# Patient Record
Sex: Female | Born: 1945 | Race: Black or African American | Hispanic: No | Marital: Single | Smoking: Former smoker
Health system: Southern US, Community
[De-identification: ages and names within clinical notes are randomized; demographics above are authoritative.]

## PROBLEM LIST (undated history)

## (undated) DIAGNOSIS — K5790 Diverticulosis of intestine, part unspecified, without perforation or abscess without bleeding: Secondary | ICD-10-CM

## (undated) DIAGNOSIS — F419 Anxiety disorder, unspecified: Secondary | ICD-10-CM

## (undated) DIAGNOSIS — Z9289 Personal history of other medical treatment: Secondary | ICD-10-CM

## (undated) DIAGNOSIS — I10 Essential (primary) hypertension: Secondary | ICD-10-CM

## (undated) DIAGNOSIS — Z881 Allergy status to other antibiotic agents status: Secondary | ICD-10-CM

## (undated) DIAGNOSIS — E669 Obesity, unspecified: Secondary | ICD-10-CM

## (undated) DIAGNOSIS — T7840XA Allergy, unspecified, initial encounter: Secondary | ICD-10-CM

## (undated) DIAGNOSIS — G709 Myoneural disorder, unspecified: Secondary | ICD-10-CM

## (undated) DIAGNOSIS — R7611 Nonspecific reaction to tuberculin skin test without active tuberculosis: Secondary | ICD-10-CM

## (undated) DIAGNOSIS — E785 Hyperlipidemia, unspecified: Secondary | ICD-10-CM

## (undated) DIAGNOSIS — K219 Gastro-esophageal reflux disease without esophagitis: Secondary | ICD-10-CM

## (undated) DIAGNOSIS — M1990 Unspecified osteoarthritis, unspecified site: Secondary | ICD-10-CM

## (undated) DIAGNOSIS — E119 Type 2 diabetes mellitus without complications: Secondary | ICD-10-CM

## (undated) DIAGNOSIS — I2510 Atherosclerotic heart disease of native coronary artery without angina pectoris: Secondary | ICD-10-CM

## (undated) DIAGNOSIS — Z955 Presence of coronary angioplasty implant and graft: Secondary | ICD-10-CM

## (undated) DIAGNOSIS — M25569 Pain in unspecified knee: Secondary | ICD-10-CM

## (undated) DIAGNOSIS — I872 Venous insufficiency (chronic) (peripheral): Secondary | ICD-10-CM

## (undated) DIAGNOSIS — Z5189 Encounter for other specified aftercare: Secondary | ICD-10-CM

## (undated) DIAGNOSIS — T783XXA Angioneurotic edema, initial encounter: Secondary | ICD-10-CM

## (undated) DIAGNOSIS — K449 Diaphragmatic hernia without obstruction or gangrene: Secondary | ICD-10-CM

## (undated) HISTORY — DX: Allergy, unspecified, initial encounter: T78.40XA

## (undated) HISTORY — DX: Gastro-esophageal reflux disease without esophagitis: K21.9

## (undated) HISTORY — DX: Presence of coronary angioplasty implant and graft: Z95.5

## (undated) HISTORY — DX: Obesity, unspecified: E66.9

## (undated) HISTORY — DX: Allergy status to other antibiotic agents: Z88.1

## (undated) HISTORY — DX: Nonspecific reaction to tuberculin skin test without active tuberculosis: R76.11

## (undated) HISTORY — DX: Pain in unspecified knee: M25.569

## (undated) HISTORY — DX: Type 2 diabetes mellitus without complications: E11.9

## (undated) HISTORY — DX: Essential (primary) hypertension: I10

## (undated) HISTORY — PX: BREAST LUMPECTOMY: SHX2

## (undated) HISTORY — DX: Atherosclerotic heart disease of native coronary artery without angina pectoris: I25.10

## (undated) HISTORY — PX: ABDOMINAL HYSTERECTOMY: SHX81

## (undated) HISTORY — DX: Angioneurotic edema, initial encounter: T78.3XXA

## (undated) HISTORY — DX: Hyperlipidemia, unspecified: E78.5

## (undated) HISTORY — DX: Diaphragmatic hernia without obstruction or gangrene: K44.9

## (undated) HISTORY — DX: Personal history of other medical treatment: Z92.89

## (undated) HISTORY — DX: Encounter for other specified aftercare: Z51.89

## (undated) HISTORY — DX: Anxiety disorder, unspecified: F41.9

## (undated) HISTORY — DX: Venous insufficiency (chronic) (peripheral): I87.2

## (undated) HISTORY — DX: Myoneural disorder, unspecified: G70.9

## (undated) HISTORY — DX: Diverticulosis of intestine, part unspecified, without perforation or abscess without bleeding: K57.90

## (undated) HISTORY — DX: Unspecified osteoarthritis, unspecified site: M19.90

---

## 1999-06-29 ENCOUNTER — Encounter: Payer: Self-pay | Admitting: Gastroenterology

## 1999-06-29 ENCOUNTER — Other Ambulatory Visit: Admission: RE | Admit: 1999-06-29 | Discharge: 1999-06-29 | Payer: Self-pay | Admitting: Gastroenterology

## 1999-06-29 ENCOUNTER — Encounter (INDEPENDENT_AMBULATORY_CARE_PROVIDER_SITE_OTHER): Payer: Self-pay | Admitting: Specialist

## 2003-05-29 LAB — HM DEXA SCAN

## 2003-05-31 ENCOUNTER — Emergency Department (HOSPITAL_COMMUNITY): Admission: EM | Admit: 2003-05-31 | Discharge: 2003-06-01 | Payer: Self-pay | Admitting: Emergency Medicine

## 2004-07-27 ENCOUNTER — Emergency Department (HOSPITAL_COMMUNITY): Admission: EM | Admit: 2004-07-27 | Discharge: 2004-07-28 | Payer: Self-pay | Admitting: Emergency Medicine

## 2004-08-28 LAB — HM COLONOSCOPY

## 2004-09-05 ENCOUNTER — Ambulatory Visit: Payer: Self-pay | Admitting: Gastroenterology

## 2004-09-06 ENCOUNTER — Ambulatory Visit: Payer: Self-pay | Admitting: Gastroenterology

## 2005-06-29 ENCOUNTER — Ambulatory Visit (HOSPITAL_COMMUNITY): Admission: AD | Admit: 2005-06-29 | Discharge: 2005-06-30 | Payer: Self-pay | Admitting: Cardiovascular Disease

## 2005-06-29 HISTORY — PX: CORONARY ANGIOPLASTY WITH STENT PLACEMENT: SHX49

## 2006-08-08 ENCOUNTER — Emergency Department (HOSPITAL_COMMUNITY): Admission: EM | Admit: 2006-08-08 | Discharge: 2006-08-08 | Payer: Self-pay | Admitting: Emergency Medicine

## 2006-12-28 ENCOUNTER — Ambulatory Visit (HOSPITAL_COMMUNITY): Admission: RE | Admit: 2006-12-28 | Discharge: 2006-12-28 | Payer: Self-pay | Admitting: *Deleted

## 2007-05-29 LAB — HM MAMMOGRAPHY

## 2008-07-07 LAB — HM PAP SMEAR: HM Pap smear: NORMAL

## 2009-06-06 ENCOUNTER — Emergency Department (HOSPITAL_COMMUNITY): Admission: EM | Admit: 2009-06-06 | Discharge: 2009-06-06 | Payer: Self-pay | Admitting: Emergency Medicine

## 2009-08-11 ENCOUNTER — Encounter (INDEPENDENT_AMBULATORY_CARE_PROVIDER_SITE_OTHER): Payer: Self-pay | Admitting: *Deleted

## 2009-08-28 HISTORY — PX: COLONOSCOPY: SHX174

## 2009-09-11 ENCOUNTER — Emergency Department (HOSPITAL_COMMUNITY): Admission: EM | Admit: 2009-09-11 | Discharge: 2009-09-11 | Payer: Self-pay | Admitting: Family Medicine

## 2010-03-24 ENCOUNTER — Encounter (INDEPENDENT_AMBULATORY_CARE_PROVIDER_SITE_OTHER): Payer: Self-pay | Admitting: *Deleted

## 2010-06-06 ENCOUNTER — Ambulatory Visit: Payer: Self-pay | Admitting: Gastroenterology

## 2010-06-06 ENCOUNTER — Encounter (INDEPENDENT_AMBULATORY_CARE_PROVIDER_SITE_OTHER): Payer: Self-pay | Admitting: *Deleted

## 2010-06-06 DIAGNOSIS — I2510 Atherosclerotic heart disease of native coronary artery without angina pectoris: Secondary | ICD-10-CM | POA: Insufficient documentation

## 2010-06-06 DIAGNOSIS — K219 Gastro-esophageal reflux disease without esophagitis: Secondary | ICD-10-CM | POA: Insufficient documentation

## 2010-06-06 DIAGNOSIS — Z86010 Personal history of colonic polyps: Secondary | ICD-10-CM | POA: Insufficient documentation

## 2010-06-20 ENCOUNTER — Encounter: Payer: Self-pay | Admitting: Gastroenterology

## 2010-06-28 ENCOUNTER — Ambulatory Visit: Payer: Self-pay | Admitting: Gastroenterology

## 2010-06-28 LAB — HM COLONOSCOPY

## 2010-07-19 ENCOUNTER — Telehealth: Payer: Self-pay | Admitting: Gastroenterology

## 2010-08-28 HISTORY — PX: BACK SURGERY: SHX140

## 2010-09-27 NOTE — Letter (Signed)
Summary: New Patient letter  [facility]  [address] [place], [place] [address]   Phone: [phone]  Fax: [phone]       03/24/2010 MRN: [id number]  [facility] [address] [place], [place]  [address]  Dear Ms. Smyers,  Welcome to the Gastroenterology Division at [facility].    You are scheduled to see Dr.  Russella Dar on 05-23-10 at 2:30p.m. on the 3rd floor at [facility], [address]  We ask that you try to arrive at our office 15 minutes prior to your appointment time to allow for check-in.  We would like you to complete the enclosed self-administered evaluation form prior to your visit and bring it with you on the day of your appointment.  We will review it with you.  Also, please bring a complete list of all your medications or, if you prefer, bring the medication bottles and we will list them.  Please bring your insurance card so that we may make a copy of it.  If your insurance requires a referral to see a specialist, please bring your referral form from your primary care physician.  Co-payments are due at the time of your visit and may be paid by cash, check or credit card.     Your office visit will consist of a consult with your physician (includes a physical exam), any laboratory testing he/she may order, scheduling of any necessary diagnostic testing (e.g. x-ray, ultrasound, CT-scan), and scheduling of a procedure (e.g. Endoscopy, Colonoscopy) if required.  Please allow enough time on your schedule to allow for any/all of these possibilities.    If you cannot keep your appointment, please call [phone] to cancel or reschedule prior to your appointment date.  This allows [place] the opportunity to schedule an appointment for another patient in need of care.  If you do not cancel or reschedule by 5 p.m. the business day prior to your appointment date, you will be charged a $50.00 late cancellation/no-show fee.    Thank you for choosing  [facility] for your medical needs.  We appreciate the opportunity to care for you.  Please visit [place] at our website  to learn more about our practice.                     Sincerely,                                                             The Gastroenterology Division

## 2010-09-27 NOTE — Procedures (Signed)
Summary: EGD   EGD  Procedure date:  09/06/2004  Findings:      Hiatal Hernia Location: [facility]   Patient Name: Madeline Wood, Madeline Wood. MRN:  Procedure Procedures: Panendoscopy (EGD) CPT: 43235.  Personnel: Endoscopist: Venita Lick. Russella Dar, MD, Clementeen Graham.  Referred By: Rudi Heap, MD.  Exam Location: Exam performed in Outpatient Clinic. Outpatient  Patient Consent: Procedure, Alternatives, Risks and Benefits discussed, consent obtained, from patient. Consent was obtained by the RN.  Indications Symptoms: Reflux symptoms  History  Current Medications: Patient is not currently taking Coumadin.  Pre-Exam Physical: Performed Sep 06, 2004  Entire physical exam was normal.  Exam Exam Info: Maximum depth of insertion Duodenum, intended Duodenum. Vocal cords not visualized. Gastric retroflexion performed. ASA Classification: II. Tolerance: excellent.  Sedation Meds: Patient assessed and found to be appropriate for moderate (conscious) sedation. Residual sedation present from prior procedure today. Cetacaine Spray 2 sprays given aerosolized.  Monitoring: BP and pulse monitoring done. Oximetry used. Supplemental O2 given  Findings Normal: Proximal Esophagus to Distal Esophagus.  HIATAL HERNIA: Regular, 2 cms. in length. ICD9: Hernia, Hiatal: 553.3. Normal: Fundus to Duodenal 2nd Portion.   Assessment  Diagnoses: 553.3: Hernia, Hiatal.   Events  Unplanned Intervention: No unplanned interventions were required.  Unplanned Events: There were no complications. Plans Medication(s): Continue current medications. PPI: BID,   Patient Education: Patient given standard instructions for: Hiatal Hernia. Reflux.  Disposition: After procedure patient sent to recovery. After recovery patient sent home.  Scheduling: Primary Care Rochelle Nephew, to Rudi Heap, MD,   Office Visit, to Venita Lick. Russella Dar, MD, [facility], prn    This report was created from the original endoscopy  report, which was reviewed and signed by the above listed endoscopist.    cc: Rudi Heap, MD

## 2010-09-27 NOTE — Letter (Signed)
Summary: Anticoagulation/[facility]  Anticoagulation/[facility]   Imported By: Sherian Rein 06/23/2010 14:41:41  _____________________________________________________________________  External Attachment:    Type:   Image     Comment:   External Document

## 2010-09-27 NOTE — Procedures (Signed)
Summary: Colonoscopy   Colonoscopy  Procedure date:  09/06/2004  Findings:      Results: Hemorrhoids.     Results: Diverticulosis.       Pathology:  Hyperplastic polyp.     Location:  [facility].    Procedures Next Due Date:    Colonoscopy: 08/2009  Colonoscopy  Procedure date:  09/06/2004  Findings:      Results: Hemorrhoids.     Results: Diverticulosis.       Pathology:  Hyperplastic polyp.     Location:  [facility].    Procedures Next Due Date:    Colonoscopy: 08/2009 Patient Name: Madeline Wood, Madeline Wood. MRN:  Procedure Procedures: Colonoscopy CPT: [phone].    with Hot Biopsy(s)CPT: [id number].  Personnel: Endoscopist: Venita Lick. Russella Dar, MD, Clementeen Graham.  Referred By: Rudi Heap, MD.  Exam Location: Exam performed in Outpatient Clinic. Outpatient  Patient Consent: Procedure, Alternatives, Risks and Benefits discussed, consent obtained, from patient. Consent was obtained by the RN.  Indications  Increased Risk Screening: For family history of colorectal neoplasia, in  sibling age at onset: 16.  Comments: Brother with colon ca. History  Current Medications: Patient is not currently taking Coumadin.  Pre-Exam Physical: Performed Sep 06, 2004. Entire physical exam was normal.  Exam Exam: Extent of exam reached: Cecum, extent intended: Cecum.  The cecum was identified by appendiceal orifice and IC valve. Colon retroflexion performed. ASA Classification: II. Tolerance: excellent.  Monitoring: Pulse and BP monitoring, Oximetry used. Supplemental O2 given.  Colon Prep Used Miralax for colon prep. Prep results: good.  Sedation Meds: Patient assessed and found to be appropriate for moderate (conscious) sedation. Fentanyl 125 mcg. given IV. Versed 12 given IV.  Findings - DIVERTICULOSIS: Sigmoid Colon. Not bleeding. ICD9: Diverticulosis: 562.10.  NORMAL EXAM: Cecum to Descending Colon.  MULTIPLE POLYPS: Sigmoid Colon to Rectum. minimum size 2  mm, maximum size 3 mm. Procedure:  hot biopsy, removed, Polyp retrieved, 5 polyps Polyps sent to pathology. ICD9: Colon Polyps: 211.3.  HEMORRHOIDS: Internal. Size: Small. Not bleeding. Not thrombosed. ICD9: Hemorrhoids, Internal: 455.0.   Assessment  Diagnoses: 562.10: Diverticulosis.  211.3: Colon Polyps.  455.0: Hemorrhoids, Internal.   Events  Unplanned Interventions: No intervention was required.  Unplanned Events: There were no complications. Plans  Post Exam Instructions: No aspirin or non-steroidal containing medications: 2 weeks.  Medication Plan: Await pathology. Continue current medications.  Patient Education: Patient given standard instructions for: Polyps. Diverticulosis. Hemorrhoids.  Disposition: After procedure patient sent to recovery. After recovery patient sent home.  Scheduling/Referral: Colonoscopy, to [facility] T. Russella Dar, MD, [facility], around Sep 06, 2009.    This report was created from the original endoscopy report, which was reviewed and signed by the above listed endoscopist.    cc: Rudi Heap, MD

## 2010-09-27 NOTE — Letter (Signed)
Summary: Anticoagulation Modification Letter  [facility]  [address] [place], [place] [address]   Phone: [phone]  Fax: [phone]    June 06, 2010  Re:    Madeline Wood DOB:    1945-09-12 MRN:    [id number]    Dear Dr. Lynnea Ferrier,   We have scheduled the above patient for an endoscopic procedure. Our records show that  he/she is on anticoagulation therapy. Please advise as to how long the patient may come off their therapy of Plavix prior to the scheduled procedure(s) on 07/01/10.   Please fax back/or route the completed form to [place] at [phone].  Thank you for your help with this matter.  Sincerely,  Christie Nottingham CMA Duncan Dull)   Physician Recommendation:  Hold Plavix 5 days prior ________________  Hold Coumadin 5 days prior ____________  Other ______________________________     Appended Document: Anticoagulation Modification Letter left message for pt  to call back   Appended Document: Anticoagulation Modification Letter left message for pt  to call back   Appended Document: Anticoagulation Modification Letter left message for pt  to call back   Appended Document: Anticoagulation Modification Letter Pt notified to come off 5 days before procedure.

## 2010-09-27 NOTE — Assessment & Plan Note (Signed)
Summary: Gastroenterology  [facility] MR#:  [id number] Page #  NAME:  Madeline Wood, Madeline Wood  OFFICE NO:  [id number]  DATE:  09/05/04  DOB:  Jul 15, 2046  REFERRING PHYSICIAN:  Dr. Rudi Heap at [facility].    REASON FOR REFERRAL:  Gastroesophageal reflux disease and family history of colon cancer.    HISTORY OF PRESENT ILLNESS:  The patient is a very nice 65 year old [place] American female that I have seen in the past for colonoscopy.  Her brother was diagnosed with colon cancer in his 45s, and she has a paternal uncle with colon cancer as well.  She underwent colonoscopy in November of 2000, which revealed a hyperplastic sigmoid colon polyp and small internal hemorrhoids.  She was advised to return for 5-year followup, which is now due.  She has had intermittent problems with mild constipation, occasional gas and bloating, and occasional hemorrhoidal symptoms.  She has had worsening reflux symptoms and was recently increased from Aciphex daily to b.i.d.  With this regimen, she has had good control of her reflux symptoms.  She has no dysphagia or odynophagia.  She denies any weight loss, change in stool caliber, change in bowel habits, melena, or hematochezia.    PAST MEDICAL HISTORY:  Hypertension, hyperlipidemia, arthritis, anxiety, sinus trouble, status post hysterectomy, status post bilateral tubal ligation, status post breast cyst removal, hyperplastic colon polyps, internal hemorrhoids.    CURRENT MEDICATIONS:  Listed on the chart, updated and reviewed.  MEDICATION ALLERGIES:  Cipro, Lexapro, and Paxil.    SOCIAL HISTORY:  She is divorced with 1 child.  She is a former cigarette smoker and quit 2 years ago.  She denies any alcohol use.    REVIEW OF SYSTEMS:  Several areas positive as per the diagnostic evaluation form.    PHYSICAL EXAMINATION:  Overweight, in no acute distress.  Height 5 feet 6 inches, weight 233 pounds, blood pressure is 118/68, pulse 84 and regular.  HEENT  exam:  Anicteric sclerae.  Oropharynx clear.  Chest:  Clear to auscultation.  Cardiac:  Regular rate and rhythm without murmurs.  Abdomen:  Soft, nontender, and nondistended, normoactive bowel sounds.  No palpable organomegaly, masses, or hernias.  Rectal examination:  Deferred to time of colonoscopy.  Extremities:  Without clubbing, cyanosis, or edema.  Neurologic:  Alert and oriented x3.   Grossly nonfocal.    ASSESSMENT AND PLAN:   1.  Worsening gastroesophageal reflux disease.  Rule out erosive esophagitis.  Maintain all standard antireflux measures.  Written literature supplied to the patient.  She will maintain Aciphex 20 mg p.o. b.i.d.  Risks, benefits, and alternatives to upper endoscopy with possible biopsy discussed with the patient and she consents to proceed.  This will be scheduled electively.   2.  Family history of colon cancer-brother in his 13s and a paternal uncle as well.  Risks, benefits, and alternatives to colonoscopy with possible biopsy and possible polypectomy discussed with the patient and she consents to proceed.  This will be scheduled electively at the time of her upper endoscopy.        Venita Lick. Russella Dar, M.D., F.A.C.G.  [id number] cc:  Dr. Rudi Heap, [facility]  D:  09/05/04; T:  ; Job [id number]

## 2010-09-27 NOTE — Procedures (Signed)
Summary: [employer] [facility]   Imported By: Sherian Rein 06/07/2010 07:06:40  _____________________________________________________________________  External Attachment:    Type:   Image     Comment:   External Document

## 2010-09-27 NOTE — Letter (Signed)
Summary: Colonoscopy Letter  [facility]  [address] [place], [place] [address]   Phone: [phone]  Fax: [phone]      August 11, 2009 MRN: [id number]   [facility] [address] [place], [place]  [address]   Dear Madeline Wood,   According to your medical record, it is time for you to schedule a Colonoscopy. The American Cancer Society recommends this procedure as a method to detect early colon cancer. Patients with a family history of colon cancer, or a personal history of colon polyps or inflammatory bowel disease are at increased risk.  This letter has beeen generated based on the recommendations made at the time of your procedure. If you feel that in your particular situation this may no longer apply, please contact our office.  Please call our office at [phone] to schedule this appointment or to update your records at your earliest convenience.  Thank you for cooperating with [place] to provide you with the very best care possible.   Sincerely,  Judie Petit T. Russella Dar, M.D.  [facility] [phone]

## 2010-09-27 NOTE — Procedures (Signed)
Summary: Colonoscopy  Patient: [name] Note: All result statuses are Final unless otherwise noted.  Tests: (1) Colonoscopy (COL)   COL Colonoscopy           DONE     [facility]     [address]     [place], [place]  [address]           COLONOSCOPY PROCEDURE REPORT           PATIENT:  Madeline Wood, Madeline Wood  MR#:  [id number]     BIRTHDATE:  1945-09-06, 63 yrs. old  GENDER:  female     ENDOSCOPIST:  Judie Petit T. Russella Dar, MD, [facility]           PROCEDURE DATE:  06/28/2010     PROCEDURE:  Colonoscopy 19147     ASA CLASS:  Class II     INDICATIONS:  1) surveillance and high-risk screening  2) family     history of colon cancer: brother at 4 and paternal uncle.     MEDICATIONS:   Fentanyl 100 mcg IV, Versed 10 mg IV     DESCRIPTION OF PROCEDURE:   After the risks benefits and     alternatives of the procedure were thoroughly explained, informed     consent was obtained.  Digital rectal exam was performed and     revealed no abnormalities.   The LB [id number] endoscope     was introduced through the anus and advanced to the cecum, which     was identified by both the appendix and ileocecal valve, without     limitations.  The quality of the prep was good, using MoviPrep.     The instrument was then slowly withdrawn as the colon was fully     examined.     <<PROCEDUREIMAGES>>     FINDINGS:  Mild diverticulosis was found in the sigmoid colon.     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 It was mild.     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 It was 4     mm in size and nonbleeding.  Retroflexed views in the rectum     revealed internal hemorrhoids, small.  The exam was otherwise     unremarkable. The time to cecum =  4  minutes. The scope was then     withdrawn (time =  7.75  min) from the patient and the procedure     completed.           COMPLICATIONS:  None           ENDOSCOPIC IMPRESSION:     1) Mild diverticulosis     2) Mild melanosis  throughout the colon     3) Internal hemorrhoids     4) Cecal AVM           RECOMMENDATIONS:     1) High fiber diet with liberal fluid intake.     2) Repeat Colonoscopy in 5 years.           Venita Lick. Russella Dar, MD, Clementeen Graham           CC: Rudi Heap, MD           n.     Rosalie DoctorVenita Lick. Vivian Okelley at 06/28/2010 02:38 PM           [employer], [id number]  Note: An exclamation mark (!) indicates a result that was not dispersed into the flowsheet. Document Creation Date: 06/28/2010  2:39 PM _______________________________________________________________________  (1) Order result status: Final Collection or observation date-time: 06/28/2010 14:31 Requested date-time:  Receipt date-time:  Reported date-time:  Referring Physician:   Ordering Physician: Claudette Head [id number]) Specimen Source:  Source: Launa Grill Order Number: [id number] Lab site:   Appended Document: Colonoscopy    Clinical Lists Changes  Observations: Added new observation of COLONNXTDUE: 06/2015 (06/28/2010 16:05)

## 2010-09-27 NOTE — Progress Notes (Signed)
Summary: Triage  Phone Note Call from Patient Call back at [phone]   Caller: Patient Call For: Dr. Russella Dar Reason for Call: Talk to Nurse Summary of Call: rectal bleeding/burning from her hemorroids. Is out of town and would like to see what she can do Initial call taken by: Karna Christmas,  July 19, 2010 11:47 AM  Follow-up for Phone Call        Patient  is in [place].  States she had a large amount of blood with some clots yesterday.  She says today she is still having some bleeding today.  Patient  is on Plavix.  She is requesting advice.  Patient is advised to go to Urgent care for eval today in [place]. Follow-up by: Darcey Nora RN, CGRN,  July 19, 2010 1:28 PM  Additional Follow-up for Phone Call Additional follow up Details #1::        Agree. Needs medical evaluation. Has hemorrhoids and an AVM on her colonoscopy earlier this month. Additional Follow-up by: Meryl Dare MD Clementeen Graham,  July 19, 2010 1:31 PM

## 2010-09-27 NOTE — Letter (Signed)
Summary: [facility] Instructions  [facility]  [address] [place], [place] [address]   Phone: [phone]  Fax: [phone]       Madeline Wood    1946/08/10    MRN: [id number]        Procedure Day /Date: Friday November 4th, 2011     Arrival Time: 2:00PM     Procedure Time: 3:00pm     Location of Procedure:                    _ x_  [facility] (4th Floor)                        PREPARATION FOR COLONOSCOPY WITH MOVIPREP   Starting 5 days prior to your procedure 06/27/10 do not eat nuts, seeds, popcorn, corn, beans, peas,  salads, or any raw vegetables.  Do not take any fiber supplements (e.g. Metamucil, Citrucel, and Benefiber).  THE DAY BEFORE YOUR PROCEDURE         DATE: 06/30/10  DAY: Thursday  1.  Drink clear liquids the entire day-NO SOLID FOOD  2.  Do not drink anything colored red or purple.  Avoid juices with pulp.  No orange juice.  3.  Drink at least 64 oz. (8 glasses) of fluid/clear liquids during the day to prevent dehydration and help the prep work efficiently.  CLEAR LIQUIDS INCLUDE: Water Jello Ice Popsicles Tea (sugar ok, no milk/cream) Powdered fruit flavored drinks Coffee (sugar ok, no milk/cream) Gatorade Juice: apple, white grape, white cranberry  Lemonade Clear bullion, consomm, broth Carbonated beverages (any kind) Strained chicken noodle soup Hard Candy                             4.  In the morning, mix first dose of MoviPrep solution:    Empty 1 Pouch A and 1 Pouch B into the disposable container    Add lukewarm drinking water to the top line of the container. Mix to dissolve    Refrigerate (mixed solution should be used within 24 hrs)  5.  Begin drinking the prep at 5:00 p.m. The MoviPrep container is divided by 4 marks.   Every 15 minutes drink the solution down to the next mark (approximately 8 oz) until the full liter is complete.   6.  Follow completed prep with 16 oz of clear liquid of your choice  (Nothing red or purple).  Continue to drink clear liquids until bedtime.  7.  Before going to bed, mix second dose of MoviPrep solution:    Empty 1 Pouch A and 1 Pouch B into the disposable container    Add lukewarm drinking water to the top line of the container. Mix to dissolve    Refrigerate  THE DAY OF YOUR PROCEDURE      DATE: 07/01/10 DAY: Friday  Beginning at 10:00 a.m. (5 hours before procedure):         1. Every 15 minutes, drink the solution down to the next mark (approx 8 oz) until the full liter is complete.  2. Follow completed prep with 16 oz. of clear liquid of your choice.    3. You may drink clear liquids until 1:00pm (2 HOURS BEFORE PROCEDURE).   MEDICATION INSTRUCTIONS  Unless otherwise instructed, you should take regular prescription medications with a small sip of water   as early as possible the morning of your  procedure. You will be contacted by our office prior to your procedure for directions on holding your Plavix.  If you do not hear from our office 1 week prior to your scheduled procedure, please call [phone] to discuss.        OTHER INSTRUCTIONS  You will need a responsible adult at least 66 years of age to accompany you and drive you home.   This person must remain in the waiting room during your procedure.  Wear loose fitting clothing that is easily removed.  Leave jewelry and other valuables at home.  However, you may wish to bring a book to read or  an iPod/MP3 player to listen to music as you wait for your procedure to start.  Remove all body piercing jewelry and leave at home.  Total time from sign-in until discharge is approximately 2-3 hours.  You should go home directly after your procedure and rest.  You can resume normal activities the  day after your procedure.  The day of your procedure you should not:   Drive   Make legal decisions   Operate machinery   Drink alcohol   Return to work  You will receive specific  instructions about eating, activities and medications before you leave.    The above instructions have been reviewed and explained to me by   Marchelle Folks.    I fully understand and can verbalize these instructions _____________________________ Date _________

## 2010-09-27 NOTE — Assessment & Plan Note (Signed)
Summary: discuss colon on Plavix--ch.   History of Present Illness Visit Type: new patient  Primary GI MD: Elie Goody MD [facility] Primary Nada Godley: Ernestina Penna, MD  Requesting Aubryn Spinola: na Chief Complaint: Family history of colon cancer, GERD  History of Present Illness:   This is a 65 year old female here today with her sister. She has a family history of colon cancer with her brother developing colon cancer at 38 and a paternal uncle with colon cancer. Her last colonoscopy was in January 2006 which showed a hyperplastic colon polyp, diverticulosis, and hemorrhoids. She is status post a coronary artery stent placed in 2006 and states she has been stable from a cardiovascular standpoint since that time. She is maintained on Plavix and ASA. She has occasional heartburn and reflux symptoms are well controlled on Prilosec. She underwent upper endoscopy in January 2006 that showed only a small hiatal hernia.   GI Review of Systems    Reports acid reflux and  heartburn.      Denies abdominal pain, belching, bloating, chest pain, dysphagia with liquids, dysphagia with solids, loss of appetite, nausea, vomiting, vomiting blood, weight loss, and  weight gain.        Denies anal fissure, black tarry stools, change in bowel habit, constipation, diarrhea, diverticulosis, fecal incontinence, heme positive stool, hemorrhoids, irritable bowel syndrome, jaundice, light color stool, liver problems, rectal bleeding, and  rectal pain.   Current Medications (verified): 1)  Fish Oil 1000 Mg Caps (Omega-3 Fatty Acids) .... One Capsule By Mouth Once Daily 2)  Zyrtec Allergy 10 Mg Tabs (Cetirizine Hcl) .... One Tablet By Mouth Once Daily 3)  Glucosamine-Chondroitin  Caps (Glucosamine-Chondroit-Vit C-Mn) .... One Capsule By Mouth Once Daily 4)  Multivitamins   Tabs (Multiple Vitamin) .... One Tablet By Mouth Once Daily 5)  Prilosec Otc 20 Mg Tbec (Omeprazole Magnesium) .... One Tablet By Mouth Once  Daily 6)  Losartan Potassium 50 Mg Tabs (Losartan Potassium) .... One Tablet By Mouth in The Morning and One and A Half Tablet By Mouth At Night 7)  Aspirin 325 Mg Tabs (Aspirin) .... One Tablet By Mouth Once Daily 8)  Meloxicam 7.5 Mg Tabs (Meloxicam) .... One Tablet By Mouth Once Daily 9)  Hydrocodone-Acetaminophen 5-325 Mg Tabs (Hydrocodone-Acetaminophen) .... As Needed 10)  Maxzide-25 37.5-25 Mg Tabs (Triamterene-Hctz) .... One Tablet By Mouth Once Daily 11)  Alprazolam 0.5 Mg Tabs (Alprazolam) .... One Tablet By Mouth Once Daily 12)  Zetia 10 Mg Tabs (Ezetimibe) .... One Tablet By Mouth Once Daily 13)  Plavix 75 Mg Tabs (Clopidogrel Bisulfate) .... One Tablet By Mouth Once Daily 14)  Lipitor 80 Mg Tabs (Atorvastatin Calcium) .... One Tablet By Mouth Once Daily  Allergies (verified): 1)  ! Cipro 2)  ! Lexapro 3)  ! Paxil  Past History:  Past Medical History: GERD Hyperplastic colon polyps  Internal hemorrhoids Arthritis Hyperlipidemia Hypertension Anxiety Disorder Allergic rhinitis Hiatal Hernia Diverticulosis Coronary Artery Disease Urinary Tract Infection  Past Surgical History: Hysterectomy Bilateral tubal ligation Breast cyst removal Angioplasty/coronary stent--06/2005  Family History: Family History of Colon Cancer:Brother diagnosed in his 22's, Paternal Uncle Family History of Diabetes: Sister   Social History: Retired Divorced One Child  Patient is a former smoker.  Alcohol Use - no Illicit Drug Use - no  Review of Systems       The patient complains of allergy/sinus, anxiety-new, arthritis/joint pain, and back pain.         The pertinent positives and negatives are noted  as above and in the HPI. All other ROS were reviewed and were negative.   Vital Signs:  Patient profile:   65 year old female Height:      66 inches Weight:      247 pounds BMI:     40.01 BSA:     2.19 Pulse rate:   68 / minute Pulse rhythm:   regular BP sitting:   138 /  82  (left arm) Cuff size:   regular  Vitals Entered By: Ok Anis CMA (June 06, 2010 11:19 AM)  Physical Exam  General:  Well developed, well nourished, no acute distress. obese.   Head:  Normocephalic and atraumatic. Eyes:  PERRLA, no icterus. Ears:  Normal auditory acuity. Mouth:  No deformity or lesions, dentition normal. Neck:  Supple; no masses or thyromegaly. Lungs:  Clear throughout to auscultation. Heart:  Regular rate and rhythm; no murmurs, rubs,  or bruits. Abdomen:  Soft, nontender and nondistended. No masses, hepatosplenomegaly or hernias noted. Normal bowel sounds. Rectal:  deferred until time of colonoscopy.   Msk:  Symmetrical with no gross deformities. Normal posture. Pulses:  Normal pulses noted. Extremities:  No clubbing, cyanosis, edema or deformities noted. Neurologic:  Alert and  oriented x4;  grossly normal neurologically. Cervical Nodes:  No significant cervical adenopathy. Inguinal Nodes:  No significant inguinal adenopathy. Psych:  Alert and cooperative. Normal mood and affect.  Impression & Recommendations:  Problem # 1:  FAMILY HX COLON CANCER (ICD-V16.0) Brother with colon cancer at age 77 and a paternal uncle with colon cancer. She is due for screening colonoscopy. The risks, benefits and alternatives to colonoscopy with possible biopsy and possible polypectomy were discussed with the patient and they consent to proceed. The procedure will be scheduled electively.  Problem # 2:  CORONARY ARTERY DISEASE (ICD-414.00) Status post coronary stent placement approximately 5 years ago. Plan for 5 days hold off Plavix, and she will maintain aspirin. Will obtain clearance from her cardiologist.  Problem # 3:  GERD (ICD-530.81) GERD well-controlled on omeprazole 20 mg daily, and standard antireflux measures.  Other Orders: Colonoscopy (Colon)  Patient Instructions: 1)  Colonoscopy brochure given.  2)  Pick up your prep from your pharmacy.  3)  Copy  sent to : Rudi Heap, MD 4)                       Ritta Slot, MD 5)  The medication list was reviewed and reconciled.  All changed / newly prescribed medications were explained.  A complete medication list was provided to the patient / caregiver.  Prescriptions: MOVIPREP 100 GM  SOLR (PEG-KCL-NACL-NASULF-NA ASC-C) As per prep instructions.  #1 x 0   Entered by:   Christie Nottingham CMA (AAMA)   Authorized by:   Meryl Dare MD [facility]   Signed by:   Christie Nottingham CMA (AAMA) on 06/06/2010   Method used:   Electronically to        [facility]  [address] [facility] [address] [id number] (retail)       [address]       [place], [place]  [address]       Ph: [phone]       Fax: [phone]   RxID:   [id number]

## 2011-01-13 NOTE — Cardiovascular Report (Signed)
Madeline Wood, Madeline Wood                ACCOUNT NO.:  [id number]   MEDICAL RECORD NO.:  [id number]          PATIENT TYPE:  OIB   LOCATION:  6526                         FACILITY:  [facility]   PHYSICIAN:  Darlin Priestly, MD  DATE OF BIRTH:  [birth year]   DATE OF PROCEDURE:  06/29/2005  DATE OF DISCHARGE:                              CARDIAC CATHETERIZATION   PROCEDURE:  1.  Left heart catheterization.  2.  Coronary angiography.  3.  Left ventriculogram.  4.  Placement of RCA -- distal placement of intercoronary stent.   COMPLICATIONS:  None.   INDICATIONS:  Madeline Wood is a 65 year old female patient of Madeline Wood  at [facility] with a history of hypertension,  hyperlipidemia, history of reflux disease, and remote smoking history.  She  recently has complained of palpitations.  She did undergo a Cardiolite scan  on 05/04/2005 suggesting inferior wall ischemic with normal EF.  She also  wore a Holter monitor which revealed a nonsustained VT.  She is now referred  for a cardiac catheterization to rule out significant CAD.   DESCRIPTION OF PROCEDURE:  After giving an informed consent, the patient was  brought to the catheterization laboratory and the right leg and groin was  shaved, prepped and draped in the usual sterile fashion.  ECG monitoring was  established.  Using modified Seldinger's technique a 6-French arterial  sheath was inserted into the right femoral artery. A 6-French diagnostic was  used to perform diagnostic angiography.   LEFT HEART CATHETERIZATION:  1.  The left main is a large vessel with no significant disease.  2.  The LAD is a large vessel that courses to the apex and gives rise to 2      diagonal branches.  The LAD has no significant disease.  3.  The first diagonal branch is a large vessel with bifurcation distally      with no significant disease.  The second diagonal was a small vessel      with no significant disease.  4.  The left  circumflex is a medium sized vessel that courses through the AV      groove into two obtuse marginal branches.  The AV groove circumflex has      no significant disease.  5.  The first OM is a moderate sized vessel which bifurcates distally with      no evidence of disease.  The second OM is a small vessel with no      significant disease.  6.  The right coronary artery is a large vessel which is dominant.  PDS of      the first lateral branch.  The RCA is coarsely irregular and up to 40%      diseased throughout its proximal and mid segment.  There is a long 80%      lesion of the distal portion of the RCA prior to the bifurcation of the      PDA and posterolateral branch.   The PDA and posterolateral branch were medium sized vessels with no  significant disease.  The left ventriculogram reveals an EF at 60%.   HEMODYNAMIC SYSTEM:  The aortic pressure is 128/74.  LV systolic pressure  124/5, LVDP of 15.   INTERVENTIONAL PROCEDURE:  RCA--__________.  Following diagnostic  angiography a 6-French JR-4 guiding catheter with side holes was __________  engaged in the right coronary ostium.  Next a 0.014 Forte marker wire was  advanced out of the guiding catheter and used to cross the distal RCA  stenotic lesion.  The measurements were obtained.  The guidewire was then  positioned into the posterolateral branch without difficulty.   Next a Taxus 2.5 x 24 mm drug-eluting stent was then positioned across the  distal RCA stenotic lesion.  The stent was deployed to 10 atmospheres for a  total of 32 seconds, the second inflation to 14 atmospheres was performed  for a total of 22 seconds.  Followup angiogram revealed no evidence of  dissection thrombus with TIMI 3 flow to the distal vessel.   IV Angiomax was used throughout the case.   Final angiograms revealed less than 10% residual stenosis in the distal RCA  with TIMI 3 flow in the distal vessel.  At this point, we elected to  conclude  the procedure.  All balloons, instruments, and catheters were  removed.  __________ sheath was sewn in place.  The patient was transferred  back to the ward in stable condition.   CONCLUSIONS:  1.  Successful placement of a Taxus 2.5 x 24 mm drug-eluting stent in the      distal RCA stenotic lesion.  2.  Normal LV systolic function.  3.  Adjuvant use of Angiomax infusion.      Darlin Priestly, MD  Electronically Signed     RHM/MEDQ  D:  06/29/2005  T:  06/29/2005  Job:  [id number]   cc:   Shon Hale Baldo Daub [facility]

## 2011-01-13 NOTE — Discharge Summary (Signed)
NAMETRELLIS, GUIRGUIS                ACCOUNT NO.:  [id number]   MEDICAL RECORD NO.:  [id number]          PATIENT TYPE:  INP   LOCATION:  6526                         FACILITY:  [facility]   PHYSICIAN:  Madeline Priestly, MD  DATE OF BIRTH:  Sep 26, 1945   DATE OF ADMISSION:  06/29/2005  DATE OF DISCHARGE:  06/30/2005                                 DISCHARGE SUMMARY   Ms. Madeline Wood is a 65 year old female patient seen in the office by Dr. Jenne Wood  for palpitations.  She had multiple cardiac tests as an outpatient.  She had  a positive Cardiolite test showing RCA territory ischemia.  She also had  multiple PVCs on the monitor, thus she was brought in for cardiac  catheterization.  This was performed by Dr. Lenise Wood and she was found  to have high-grade disease in her RCA with multiple lesions.  She had a  Taxus stent placed 2.5 x 24, reduced lesion from 80% to less than 10%.  The  following day she was in stable condition and considered stable for  discharge home.  Her blood pressure was 100/52, respirations are 20, pulse  is 80, room air saturations were 99%.  Her potassium was 3.5.  She was given  potassium supplement.  Her LDL was 87.  Zetia was added.   DISCHARGE MEDICATIONS:  1.  Aspirin 325 mg one time per day.  2.  Plavix 75 mg one time per day.  She was told not to stop this      medication.  3.  Toprol-XL 50 mg one time per day.  4.  Aciphex 20 mg two times per day.  5.  Take Prilosec as prior to hospitalization.  6.  Lipitor 80 mg one time per day.  7.  Allopurinol 300 mg one time per day.  8.  Clonazepam 0.5 mg two times a day.  9.  She can take Maxzide as needed for swelling, but should only use it on a      p.r.n. basis.  10. Zetia 10 mg one time per day.  11. Nitroglycerin p.r.n. for chest pain.   FOLLOW UP:  She will have a followup appointment with Dr. Jenne Wood on  November 21 at 9:45.  She should start an exercise program, walk for 30  minutes most days of the week  starting one week after discharge.   DISCHARGE DIAGNOSES:  1.  Coronary artery disease status post cardiac catheterization after      positive Cardiolite.  She subsequently underwent Taxus stenting to her      right coronary artery.  2.  Palpitations.  3.  Hyperlipidemia.  4.  History of gout.      Madeline Wood, N.P.      Madeline Priestly, MD  Electronically Signed    BB/MEDQ  D:  09/09/2005  T:  09/11/2005  Job:  [id number]   cc:   [facility]

## 2011-02-27 ENCOUNTER — Encounter: Payer: Self-pay | Admitting: Family Medicine

## 2011-02-27 DIAGNOSIS — K449 Diaphragmatic hernia without obstruction or gangrene: Secondary | ICD-10-CM | POA: Insufficient documentation

## 2011-02-27 DIAGNOSIS — F419 Anxiety disorder, unspecified: Secondary | ICD-10-CM | POA: Insufficient documentation

## 2011-02-27 DIAGNOSIS — I10 Essential (primary) hypertension: Secondary | ICD-10-CM | POA: Insufficient documentation

## 2011-02-27 DIAGNOSIS — Z881 Allergy status to other antibiotic agents status: Secondary | ICD-10-CM | POA: Insufficient documentation

## 2011-02-27 DIAGNOSIS — M25569 Pain in unspecified knee: Secondary | ICD-10-CM | POA: Insufficient documentation

## 2011-02-27 DIAGNOSIS — E669 Obesity, unspecified: Secondary | ICD-10-CM | POA: Insufficient documentation

## 2011-02-27 DIAGNOSIS — K219 Gastro-esophageal reflux disease without esophagitis: Secondary | ICD-10-CM | POA: Insufficient documentation

## 2011-02-27 DIAGNOSIS — E782 Mixed hyperlipidemia: Secondary | ICD-10-CM | POA: Insufficient documentation

## 2011-02-27 DIAGNOSIS — R7611 Nonspecific reaction to tuberculin skin test without active tuberculosis: Secondary | ICD-10-CM | POA: Insufficient documentation

## 2011-02-27 DIAGNOSIS — M109 Gout, unspecified: Secondary | ICD-10-CM | POA: Insufficient documentation

## 2011-03-29 DIAGNOSIS — Z9289 Personal history of other medical treatment: Secondary | ICD-10-CM

## 2011-03-29 HISTORY — DX: Personal history of other medical treatment: Z92.89

## 2011-03-30 ENCOUNTER — Encounter: Payer: Self-pay | Admitting: Family Medicine

## 2011-04-19 ENCOUNTER — Ambulatory Visit (HOSPITAL_COMMUNITY)
Admission: RE | Admit: 2011-04-19 | Discharge: 2011-04-19 | Disposition: A | Discharge status: Home / Self Care | Payer: Medicare Other | Source: Ambulatory Visit | Attending: Orthopedic Surgery | Admitting: Orthopedic Surgery

## 2011-04-19 ENCOUNTER — Other Ambulatory Visit (HOSPITAL_COMMUNITY): Payer: Self-pay | Admitting: Orthopedic Surgery

## 2011-04-19 ENCOUNTER — Encounter (HOSPITAL_COMMUNITY)
Admission: RE | Admit: 2011-04-19 | Discharge: 2011-04-19 | Disposition: A | Discharge status: Home / Self Care | Payer: Medicare Other | Source: Ambulatory Visit | Attending: Orthopedic Surgery | Admitting: Orthopedic Surgery

## 2011-04-19 DIAGNOSIS — R52 Pain, unspecified: Secondary | ICD-10-CM

## 2011-04-19 DIAGNOSIS — Z01812 Encounter for preprocedural laboratory examination: Secondary | ICD-10-CM | POA: Insufficient documentation

## 2011-04-19 DIAGNOSIS — Z01818 Encounter for other preprocedural examination: Secondary | ICD-10-CM | POA: Insufficient documentation

## 2011-04-19 LAB — BASIC METABOLIC PANEL
BUN: 18 mg/dL (ref 6–23)
CO2: 30 mEq/L (ref 19–32)
Calcium: 10 mg/dL (ref 8.4–10.5)
Chloride: 95 mEq/L — ABNORMAL LOW (ref 96–112)
Creatinine, Ser: 0.78 mg/dL (ref 0.50–1.10)
GFR calc Af Amer: >60 mL/min (ref >60)
GFR calc non Af Amer: >60 mL/min (ref >60)
Glucose, Bld: 106 mg/dL — ABNORMAL HIGH (ref 70–99)
Potassium: 4.5 mEq/L (ref 3.5–5.1)
Sodium: 131 mEq/L — ABNORMAL LOW (ref 135–145)

## 2011-04-19 LAB — PROTIME-INR
INR: 1.06 (ref 0.00–1.49)
Prothrombin Time: 14 seconds (ref 11.6–15.2)

## 2011-04-19 LAB — URINALYSIS, ROUTINE W REFLEX MICROSCOPIC
Bilirubin Urine: NEGATIVE
Glucose, UA: NEGATIVE mg/dL
Hgb urine dipstick: NEGATIVE
Ketones, ur: NEGATIVE mg/dL
Leukocytes, UA: NEGATIVE
Nitrite: NEGATIVE
Protein, ur: NEGATIVE mg/dL
Specific Gravity, Urine: 1.008 (ref 1.005–1.030)
Urobilinogen, UA: 0.2 mg/dL (ref 0.0–1.0)
pH: 6.5 (ref 5.0–8.0)

## 2011-04-19 LAB — CBC
HCT: 36.9 % (ref 36.0–46.0)
Hemoglobin: 12.8 g/dL (ref 12.0–15.0)
MCH: 31 pg (ref 26.0–34.0)
MCHC: 34.7 g/dL (ref 30.0–36.0)
MCV: 89.3 fL (ref 78.0–100.0)
Platelets: 212 10*3/uL (ref 150–400)
RBC: 4.13 MIL/uL (ref 3.87–5.11)
RDW: 13.1 % (ref 11.5–15.5)
WBC: 6.8 10*3/uL (ref 4.0–10.5)

## 2011-04-19 LAB — DIFFERENTIAL
Basophils Absolute: 0 10*3/uL (ref 0.0–0.1)
Basophils Relative: 1 % (ref 0–1)
Eosinophils Absolute: 0.1 10*3/uL (ref 0.0–0.7)
Eosinophils Relative: 2 % (ref 0–5)
Lymphocytes Relative: 46 % (ref 12–46)
Lymphs Abs: 3.1 10*3/uL (ref 0.7–4.0)
Monocytes Absolute: 0.6 10*3/uL (ref 0.1–1.0)
Monocytes Relative: 8 % (ref 3–12)
Neutro Abs: 2.9 10*3/uL (ref 1.7–7.7)
Neutrophils Relative %: 43 % (ref 43–77)

## 2011-04-19 LAB — SURGICAL PCR SCREEN
MRSA, PCR: NEGATIVE
Staphylococcus aureus: NEGATIVE

## 2011-04-19 LAB — APTT: aPTT: 26 seconds (ref 24–37)

## 2011-04-27 ENCOUNTER — Inpatient Hospital Stay (HOSPITAL_COMMUNITY): Payer: Medicare Other

## 2011-04-27 ENCOUNTER — Inpatient Hospital Stay (HOSPITAL_COMMUNITY)
Admission: RE | Admit: 2011-04-27 | Discharge: 2011-05-01 | DRG: 460 | Disposition: A | Discharge status: Home Health | Payer: Medicare Other | Source: Ambulatory Visit | Attending: Orthopedic Surgery | Admitting: Orthopedic Surgery

## 2011-04-27 DIAGNOSIS — M4310 Spondylolisthesis, site unspecified: Principal | ICD-10-CM | POA: Diagnosis present

## 2011-04-27 DIAGNOSIS — M48061 Spinal stenosis, lumbar region without neurogenic claudication: Secondary | ICD-10-CM | POA: Diagnosis present

## 2011-04-27 DIAGNOSIS — M7130 Other bursal cyst, unspecified site: Secondary | ICD-10-CM | POA: Diagnosis present

## 2011-04-27 LAB — TYPE AND SCREEN
ABO/RH(D): O POS
Antibody Screen: NEGATIVE

## 2011-04-27 LAB — ABO/RH: ABO/RH(D): O POS

## 2011-04-29 ENCOUNTER — Inpatient Hospital Stay (HOSPITAL_COMMUNITY): Payer: Medicare Other

## 2011-05-02 NOTE — Op Note (Signed)
NAMEANGY, SWEARENGIN                ACCOUNT NO.:  [id number]  MEDICAL RECORD NO.:  [id number]  LOCATION:  2550                         FACILITY:  [facility]  PHYSICIAN:  Estill Bamberg, MD      DATE OF BIRTH:  May 29, 1946  DATE OF PROCEDURE:  04/27/2011 DATE OF DISCHARGE:                              OPERATIVE REPORT   PREOPERATIVE DIAGNOSES: 1. Grade 1 spondylolisthesis L4-5. 2. 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  POSTOPERATIVE DIAGNOSES: 1. Grade 1 spondylolisthesis L4-5. 2. 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  PROCEDURE: 1. Left-sided transforaminal lumbar interbody fusion L4-5. 2. Right-sided posterolateral fusion L4-5. 3. Placement of posterior instrumentation L4-5. 4. Placement of interbody device L4-5 (Concorde lordotic cage 9 x 10 x     27 mm). 5. Use of local autograft. 6. Intraoperative bone marrow aspiration from the patient's right     iliac crest using a separate incision. 7. Intraoperative use of fluoroscopy.  SURGEON:  Estill Bamberg, MD  ASSISTANT:  Dan Humphreys, PA  ANESTHESIA:  General endotracheal anesthesia.  COMPLICATIONS:  None.  DISPOSITION:  Stable.  ESTIMATED BLOOD LOSS:  300 mL.  INDICATIONS FOR PROCEDURE:  Briefly, Madeline Wood is an extremely pleasant 65- year-old female, who presented to my office with severe pain in her left leg for the last 1-1/2 years.  The distribution of her pain was very much in the distribution of the L5 nerve on the left side.  The patient did have extensive nonoperative treatment including physical therapy, in addition antiinflammatories, pain medications, and multiple epidural injections.  She states she did get temporary improvement with her injections but again pain did recur.  The patient and myself did review an MRI which was notable for a rather impressive left side facet cyst at the L4-5 level which I did feel was correlate to the patient's symptomatology.   Given her failure nonoperative care, I did have a discussion with the patient regarding going forward with a transforaminal lumbar interbody fusion on the left side.  The patient fully understood the risks and limitations of the procedure as outlined in my preoperative note.  OPERATIVE DETAILS:  On April 27, 2011, the patient brought to Surgery and general endotracheal anesthesia was administered.  The patient was placed prone on a well-padded Jackson flat table with a Wilson frame. All bony prominences were meticulously padded.  Antibiotics were given and SCDs were placed.  Time-out procedure was performed.  The back was prepped and draped in the usual sterile fashion.  I then placed two 18- gauge spinal needles over approximately L4-L5 spinous processes.  A lateral intraoperative radiograph did help me optimize the location of my incision and the trajectory of the pedicle screws in the L4 and L5 vertebral bodies.  I then made an incision from approximately spinous process of L3 to approximately spinous process of L5.  The fascia was sharply incised using electrocautery and the paraspinal musculature was retracted laterally.  I then subperiosteally exposed the lamina of L4 and L5 and a transverse processes of L4 and L5 bilaterally.  The posterolateral gutters were packed using Ray-Tec soaked with thrombin. I then cannulated the L4 and L5 pedicles.  It should be noted that it was readily obvious, that there was rather severe and profound facet arthrosis at the L4-5 level and into lesser extent the L3-4 level, and the level above.  Again, there was extensive hypertrophy of the facets. I therefore did undercut the L3-4 facet and too much more significant surgery, the L4-5 facet.  I first started on the right side, I did use a 4-mm high-speed bur to gain access to the pedicles, first at L4.  I did use a curved gearshift probe to gain access through the pedicle.  I used a ball-tip feeler to  confirm there was no cortical violation and I went forward with placing a 6-mm tap into the L4 and L5 pedicles.  There was no cortical violation noted.  I then sealed holes using bone wax.  I then turned my attention towards the patient's left side and the L4 and L5 pedicles were cannulated in the manner described previously.  The posterolateral gutters were then packed.  Then, turned my attention towards the patient's left L4 and 5 interspace.  I did meticulously remove the inferior articular process of L4 and the superior articular process of L5.  It was readily apparent that there was a rather large- sized facet cyst emanating from the left L4 facet joint.  I did use meticulous amount of care to gently tease away the facet cyst off the dural sac.  There were adhesions present and knees were gently taken down.  The facet cyst was completely removed.  There was previously compression in the lateral recess of the L4-5 level, but this was removed by removing the facet cyst.  There was also excessive overgrowth of the L4-5 facet joint.  This was again was taken down to remove compression of the traversing L5 nerve.  It should be noted that the facet cyst was still large, there was actually an indentation in the traversing L5 nerve and the thecal sac.  I then turned my attention towards the L4-5 interspace.  The traversing L5 nerve was retracted medially and the exiting L4 nerve was retracted superiorly.  I then used a 15-blade knife to perform an annulotomy at the posterolateral annulus of the L4-5 intervertebral disk.  I then used a series of paddle scrapers in addition to curettes in pituitaries to thoroughly remove the L4-5 intervertebral disk.  I was very happy with the final diskectomy. At this point, I obtained 7 mL of bone marrow aspirate from the patient's right iliac crest.  This was mixed with 10 mL of Vitoss BA. Autograft obtained from the decompression aspect of the procedure  was mixed with Vitoss BA and bone marrow aspirate mixture.  At this point, I placed a series of interbody device and I did feel that 10-mm interbody device to be the most appropriate fit.  I did select a lordotic 10-mm implant, 27 mm in length.  This was packed with autograft in addition to the Vitoss BA and bone marrow aspirate.  Prior to placing the implant, the inner body space was packed liberally with autograft and the Vitoss BA and bone marrow aspirate mixture.  I then tamped the interbody graft into the appropriate position under AP and lateral fluoroscopy.  I was very pleased the final press fit.  At this point, I packed additional Vitoss BA and bone marrow aspirate and autograft into the intervertebral space.  I then went forward placing L4 and L5 screws.  A 71 x 45 mm screws placed at L4 on  the left side and a 7 x 40-mm screws placed at L5 on the left side.  Triggered EMG was utilized and there was no triggered EMG at less than 38 mA.  I connected the screws using a 35-mm rod and the final locking procedure was performed after placing caps.  I then turned my attention towards the patient's right side.  At this point, I relaxed her retractors and copiously irrigated the wound using 1 liter of normal saline.  I used a 4-mm high-speed bur to thoroughly decorticate the transverse processes of L4 and L5 and the posterior elements of L4 and L5 and the L4-5 facet joint.  The bone marrow aspirate, Vitoss and autograft mixture was placed into the posterolateral gutter and along the posterior elements.  L4 and L5 screws were again placed.  7 x 45 mm screws were placed.  Again, the screws were tested and neither screw tested below 30 mA.  A 35-mm rod was placed between the screws and the final locking procedure was performed after placing caps.  AP and lateral fluoroscopy was used liberally throughout the procedure to confirm the appropriate trajectory of the screws.  I was very happy with  the final press fit of the screws and the final appearance on AP and lateral radiographs.  At this point, a #15 size Blake drain was placed.  The fascia was then closed using #1 Vicryl.  The deep subcutaneous layer was closed using 0 Vicryl and superficial subcutaneous layer was closed using 2-0 Vicryl.  The skin was closed using 3-0 Monocryl.  All instrument counts were correct at the termination of the procedure.  Of note, Dan Humphreys was my assistant throughout the procedure and aided in essential retraction and suctioning required throughout the surgery.     Estill Bamberg, MD     MD/MEDQ  D:  04/27/2011  T:  04/27/2011  Job:  [id number]  cc:   Frazier Richards, [place]  Electronically Signed by Estill Bamberg  on 05/02/2011 07:01:24 PM

## 2011-05-03 NOTE — Discharge Summary (Signed)
  NAMEROSALEA, WITHROW                ACCOUNT NO.:  [id number]  MEDICAL RECORD NO.:  [id number]  LOCATION:  5023                         FACILITY:  [facility]  PHYSICIAN:  Estill Bamberg, MD      DATE OF BIRTH:  12-09-1945  DATE OF ADMISSION:  04/27/2011 DATE OF DISCHARGE:  05/01/2011                              DISCHARGE SUMMARY   ADMISSION DIAGNOSES: 1. Grade 1 L4-5 spondylolisthesis. 2. L4-5 spinal stenosis.  DISCHARGE DIAGNOSES: 1. Grade 1 L4-5 spondylolisthesis. 2. L4-5 spinal stenosis.  PROCEDURE:  Left-sided transforaminal lumbar interbody fusion performed on April 27, 2011.  ADMITTING SURGEON:  Dr. Estill Bamberg.  ADMISSION HISTORY:  Briefly, Ms. Pavlich is an extremely pleasant 65 year old female who presented to my office with severe and debilitating pain in the left leg.  An MRI was diagnostic for a left-sided facet cyst at the L4-5 level which I did feel was causing left-sided L5 radiculopathy. The patient did fail extensive and conservative care.  We did have a discussion regarding going forward with surgery as noted above.  The patient was therefore admitted on April 27, 2011, for the procedure noted above.  HOSPITAL COURSE:  On April 27, 2011, the patient was brought to Surgery, underwent the procedure noted above.  The patient tolerated the procedure well and was transferred to recovery in stable condition.  Of note, a Blake drain was placed postoperatively.  The patient was evaluated by me on the morning of postop day #1.  Drain output was 100 mL.  Her back pain was minimal and her leg pain was significantly resolved.  Her drain was continued.  Her Foley catheter was discontinued on postop day #2.  PCA was discontinued on postop day #1.  On the morning of postop day #2, her drain output was 30 mL and it was discontinued.  The patient was ultimately transferred home on postop day #4, as per physical therapy's recommendations.  On the morning of the patient's  discharge, her back pain was minimal and her leg pain was absent.  Her dressing was noted to be clean, dry and intact.  DISCHARGE INSTRUCTIONS:  The patient will discontinue her dressing on postop day #5.  She was instructed on back precautions and will avoid any significant lifting, bending or twisting.  She will follow up in my office in approximately 2 weeks after her procedure.     Estill Bamberg, MD     MD/MEDQ  D:  05/02/2011  T:  05/03/2011  Job:  [id number]  Electronically Signed by Loraine Leriche Solash Tullo  on 05/03/2011 10:21:53 AM

## 2011-07-30 ENCOUNTER — Emergency Department (INDEPENDENT_AMBULATORY_CARE_PROVIDER_SITE_OTHER)
Admission: EM | Admit: 2011-07-30 | Discharge: 2011-07-30 | Disposition: A | Discharge status: Home / Self Care | Payer: Medicare Other | Source: Home / Self Care

## 2011-07-30 ENCOUNTER — Encounter (HOSPITAL_COMMUNITY): Payer: Self-pay | Admitting: *Deleted

## 2011-07-30 ENCOUNTER — Emergency Department (INDEPENDENT_AMBULATORY_CARE_PROVIDER_SITE_OTHER): Payer: Medicare Other

## 2011-07-30 DIAGNOSIS — J329 Chronic sinusitis, unspecified: Secondary | ICD-10-CM

## 2011-07-30 LAB — POCT URINALYSIS DIP (DEVICE)
Bilirubin Urine: NEGATIVE
Glucose, UA: NEGATIVE mg/dL
Hgb urine dipstick: NEGATIVE
Ketones, ur: NEGATIVE mg/dL
Nitrite: NEGATIVE
Protein, ur: NEGATIVE mg/dL
Specific Gravity, Urine: 1.015 (ref 1.005–1.030)
Urobilinogen, UA: 0.2 mg/dL (ref 0.0–1.0)
pH: 7.5 (ref 5.0–8.0)

## 2011-07-30 MED ORDER — AMOXICILLIN 500 MG PO CAPS: 500.0000 mg | ORAL_CAPSULE | Freq: Two times a day (BID) | ORAL | Status: AC

## 2011-07-30 NOTE — ED Provider Notes (Signed)
History     CSN: [id number] Arrival date & time: 07/30/2011 11:24 AM   None     Chief Complaint  Patient presents with  . Fever    pt with c/o coughing and congestion x one week fever onset last night - denies sore throat or earache  . Nasal Congestion  . Cough    (Consider location/radiation/quality/duration/timing/severity/associated sxs/prior treatment) HPI Comments: Pt with "the sniffles" for a week.  Mild cough.  Last night developed fever and chills.  Coughing up purulent sputum.   Patient is a 65 y.o. female presenting with fever and cough.  Fever Primary symptoms of the febrile illness include fever and cough. Primary symptoms do not include wheezing or shortness of breath. The current episode started more than 1 week ago. This is a new problem. The problem has been gradually worsening.  The fever began yesterday. The fever has been unchanged since its onset. The maximum temperature recorded prior to her arrival was 101 to 101.9 F. The temperature was taken by an oral thermometer.  The cough began 3 to 5 days ago. The cough is productive. The sputum is green.  Cough Associated symptoms include chills, rhinorrhea and sore throat. Pertinent negatives include no chest pain, no ear pain, no shortness of breath and no wheezing.    Past Medical History  Diagnosis Date  . Hypertension   . Gout   . Hyperlipidemia   . 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 Anxiety   . Obesity   . Knee pain   . Positive TB test   . Hiatal hernia   . GERD (gastroesophageal reflux disease)   . CAD (coronary artery disease)     Past Surgical History  Procedure Date  . Abdominal hysterectomy   . Breast lumpectomy   . Back surgery     History reviewed. No pertinent family history.  History  Substance Use Topics  . Smoking status: Never Smoker   . Smokeless tobacco: Not on file  . Alcohol Use: No    OB History    Grav Para Term Preterm Abortions TAB SAB Ect Mult Living                   Review of Systems  Constitutional: Positive for fever and chills.  HENT: Positive for congestion, sore throat, rhinorrhea and postnasal drip. Negative for ear pain, neck pain and neck stiffness.   Respiratory: Positive for cough. Negative for shortness of breath and wheezing.   Cardiovascular: Negative for chest pain.    Allergies  Celexa; Ciprofloxacin; Escitalopram oxalate; Paroxetine; and Polysporin  Home Medications   Current Outpatient Rx  Name Route Sig Dispense Refill  . ALPRAZOLAM 0.5 MG PO TABS Oral Take 0.5 mg by mouth 2 (two) times daily.      . ASPIRIN 81 MG PO TABS Oral Take 81 mg by mouth daily.      . ATORVASTATIN CALCIUM 10 MG PO TABS Oral Take 10 mg by mouth daily.      [name] CETIRIZINE HCL 10 MG PO TABS Oral Take 10 mg by mouth daily.      [name] CLOPIDOGREL BISULFATE 75 MG PO TABS Oral Take 75 mg by mouth daily.      [name] EZETIMIBE 10 MG PO TABS Oral Take 10 mg by mouth daily.      . OMEGA-3 FATTY ACIDS 1000 MG PO CAPS Oral Take 2 g by mouth daily.      . GUAIFENESIN ER 600 MG PO TB12 Oral  Take 600 mg by mouth 2 (two) times daily.      [name] HYDROCODONE-ACETAMINOPHEN 7.5-325 MG PO TABS Oral Take 1 tablet by mouth every 6 (six) hours as needed.      [name] LOSARTAN POTASSIUM 100 MG PO TABS Oral Take 100 mg by mouth daily.      [name] METOPROLOL TARTRATE 50 MG PO TABS Oral Take 50 mg by mouth once. 150mg  at bedtime    . OMEPRAZOLE MAGNESIUM 20 MG PO TBEC Oral Take 20 mg by mouth daily.      [name] OVER THE COUNTER MEDICATION  Over the counter cough medication     . TRIAMTERENE-HCTZ 37.5-25 MG PO TABS Oral Take 1 tablet by mouth daily.      [name] NITROGLYCERIN 0.4 MG/HR TD PT24 Transdermal Place 1 patch onto the skin daily.      [name] VITAMIN B-12 50 MCG PO TABS Oral Take 50 mcg by mouth daily.        BP 135/70  Pulse 87  Temp(Src) 100.4 F (38 C) (Oral)  Resp 16  SpO2 100%  Physical Exam  Constitutional: She appears well-developed and well-nourished. No distress.  HENT:  Right Ear: External  ear normal.  Left Ear: External ear normal.  Nose: Mucosal edema and rhinorrhea present. Right sinus exhibits frontal sinus tenderness. Right sinus exhibits no maxillary sinus tenderness. Left sinus exhibits frontal sinus tenderness. Left sinus exhibits no maxillary sinus tenderness.  Mouth/Throat: Oropharynx is clear and moist.  Cardiovascular: Normal rate and regular rhythm.   Pulmonary/Chest: Effort normal and breath sounds normal.       coughing  Abdominal: There is no CVA tenderness.  Lymphadenopathy:       Head (right side): No submandibular adenopathy present.       Head (left side): No submandibular adenopathy present.    She has no cervical adenopathy.    ED Course  Procedures (including critical care time)  Labs Reviewed - No data to display No results found.   No diagnosis found.    MDM  cxr does not show pneumonia. Unsure of source of fever. Pt reports had UTI in august of this year with no sx other than fever.  Denies dysuria sx but will check UA. UA is negative. Only other source of fever I can potentially identify is sinuses.  Pt does have B frontal sinus tenderness with palpation.  Discussed with pt.  Will empirically tx for sinus infection. If sx worsen or pt does not get better, pt is to follow up with pcp.          Cathlyn Parsons, NP 07/30/11 1328

## 2011-08-04 NOTE — ED Provider Notes (Signed)
Dx sinusitis sent home with amox Medical screening examination/treatment/procedure(s) were performed by non-physician practitioner and as supervising physician I was immediately available for consultation/collaboration.  Luiz Blare MD   Luiz Blare, MD 08/04/11 [phone]

## 2012-04-11 LAB — HM MAMMOGRAPHY: HM Mammogram: NORMAL

## 2012-12-05 ENCOUNTER — Telehealth: Payer: Self-pay | Admitting: Family Medicine

## 2012-12-05 MED ORDER — METOPROLOL TARTRATE 50 MG PO TABS: ORAL_TABLET | ORAL | Status: DC

## 2012-12-05 NOTE — Telephone Encounter (Signed)
Medication refilled per protocol.Patient needs to be seen before any further refills 

## 2012-12-12 ENCOUNTER — Other Ambulatory Visit (HOSPITAL_COMMUNITY): Payer: Self-pay | Admitting: Cardiovascular Disease

## 2012-12-12 DIAGNOSIS — I272 Pulmonary hypertension, unspecified: Secondary | ICD-10-CM

## 2012-12-12 DIAGNOSIS — R011 Cardiac murmur, unspecified: Secondary | ICD-10-CM

## 2012-12-16 ENCOUNTER — Other Ambulatory Visit (HOSPITAL_COMMUNITY): Payer: Self-pay | Admitting: Cardiovascular Disease

## 2012-12-16 DIAGNOSIS — R011 Cardiac murmur, unspecified: Secondary | ICD-10-CM

## 2012-12-16 DIAGNOSIS — I272 Pulmonary hypertension, unspecified: Secondary | ICD-10-CM

## 2012-12-18 ENCOUNTER — Telehealth: Payer: Self-pay | Admitting: Family Medicine

## 2012-12-18 MED ORDER — METOPROLOL TARTRATE 50 MG PO TABS: ORAL_TABLET | ORAL | Status: DC

## 2012-12-18 NOTE — Telephone Encounter (Signed)
Medication refilled per protocol.Patient needs to be seen before any further refills  Pt called appt made for 4/28

## 2012-12-20 ENCOUNTER — Encounter: Payer: Self-pay | Admitting: Family Medicine

## 2012-12-20 ENCOUNTER — Ambulatory Visit (HOSPITAL_COMMUNITY)
Admission: RE | Admit: 2012-12-20 | Discharge: 2012-12-20 | Disposition: A | Discharge status: Home / Self Care | Payer: Medicare Other | Source: Ambulatory Visit | Attending: Cardiovascular Disease | Admitting: Cardiovascular Disease

## 2012-12-20 DIAGNOSIS — I272 Pulmonary hypertension, unspecified: Secondary | ICD-10-CM

## 2012-12-20 DIAGNOSIS — I2789 Other specified pulmonary heart diseases: Secondary | ICD-10-CM | POA: Insufficient documentation

## 2012-12-20 DIAGNOSIS — R011 Cardiac murmur, unspecified: Secondary | ICD-10-CM

## 2012-12-20 HISTORY — PX: TRANSTHORACIC ECHOCARDIOGRAM: SHX275

## 2012-12-20 NOTE — Progress Notes (Signed)
[facility]   2D echo completed 12/20/2012.   Cindy Delos Klich, RDCS  

## 2012-12-23 ENCOUNTER — Ambulatory Visit (INDEPENDENT_AMBULATORY_CARE_PROVIDER_SITE_OTHER): Payer: Medicare Other | Admitting: Physician Assistant

## 2012-12-23 ENCOUNTER — Encounter: Payer: Self-pay | Admitting: Physician Assistant

## 2012-12-23 VITALS — BP 118/74 | HR 60 | Temp 97.3°F | Resp 18 | Ht 65.25 in | Wt 243.0 lb

## 2012-12-23 DIAGNOSIS — I10 Essential (primary) hypertension: Secondary | ICD-10-CM

## 2012-12-23 DIAGNOSIS — K219 Gastro-esophageal reflux disease without esophagitis: Secondary | ICD-10-CM

## 2012-12-23 DIAGNOSIS — Z86010 Personal history of colonic polyps: Secondary | ICD-10-CM

## 2012-12-23 DIAGNOSIS — F419 Anxiety disorder, unspecified: Secondary | ICD-10-CM

## 2012-12-23 DIAGNOSIS — K449 Diaphragmatic hernia without obstruction or gangrene: Secondary | ICD-10-CM

## 2012-12-23 DIAGNOSIS — E119 Type 2 diabetes mellitus without complications: Secondary | ICD-10-CM

## 2012-12-23 DIAGNOSIS — I2510 Atherosclerotic heart disease of native coronary artery without angina pectoris: Secondary | ICD-10-CM

## 2012-12-23 DIAGNOSIS — E669 Obesity, unspecified: Secondary | ICD-10-CM

## 2012-12-23 DIAGNOSIS — F411 Generalized anxiety disorder: Secondary | ICD-10-CM

## 2012-12-23 DIAGNOSIS — E785 Hyperlipidemia, unspecified: Secondary | ICD-10-CM

## 2012-12-23 LAB — LIPID PANEL
Cholesterol: 123 mg/dL (ref 0–200)
HDL: 50 mg/dL (ref >39)
LDL Cholesterol: 61 mg/dL (ref 0–99)
Total CHOL/HDL Ratio: 2.5 Ratio
Triglycerides: 62 mg/dL (ref <150)
VLDL: 12 mg/dL (ref 0–40)

## 2012-12-23 LAB — CBC WITH DIFFERENTIAL/PLATELET
Basophils Absolute: 0 10*3/uL (ref 0.0–0.1)
Basophils Relative: 0 % (ref 0–1)
Eosinophils Absolute: 0.1 10*3/uL (ref 0.0–0.7)
Eosinophils Relative: 2 % (ref 0–5)
HCT: 37.9 % (ref 36.0–46.0)
Hemoglobin: 12.8 g/dL (ref 12.0–15.0)
Lymphocytes Relative: 42 % (ref 12–46)
Lymphs Abs: 2.3 10*3/uL (ref 0.7–4.0)
MCH: 29.2 pg (ref 26.0–34.0)
MCHC: 33.8 g/dL (ref 30.0–36.0)
MCV: 86.3 fL (ref 78.0–100.0)
Monocytes Absolute: 0.4 10*3/uL (ref 0.1–1.0)
Monocytes Relative: 8 % (ref 3–12)
Neutro Abs: 2.6 10*3/uL (ref 1.7–7.7)
Neutrophils Relative %: 48 % (ref 43–77)
Platelets: 285 10*3/uL (ref 150–400)
RBC: 4.39 MIL/uL (ref 3.87–5.11)
RDW: 13.9 % (ref 11.5–15.5)
WBC: 5.4 10*3/uL (ref 4.0–10.5)

## 2012-12-23 LAB — COMPLETE METABOLIC PANEL WITH GFR
ALT: 10 U/L (ref 0–35)
AST: 17 U/L (ref 0–37)
Albumin: 4 g/dL (ref 3.5–5.2)
Alkaline Phosphatase: 46 U/L (ref 39–117)
BUN: 7 mg/dL (ref 6–23)
CO2: 30 mEq/L (ref 19–32)
Calcium: 10.1 mg/dL (ref 8.4–10.5)
Chloride: 94 mEq/L — ABNORMAL LOW (ref 96–112)
Creat: 0.77 mg/dL (ref 0.50–1.10)
GFR, Est African American: >89 mL/min
GFR, Est Non African American: 80 mL/min
Glucose, Bld: 110 mg/dL — ABNORMAL HIGH (ref 70–99)
Potassium: 5.2 mEq/L (ref 3.5–5.3)
Sodium: 130 mEq/L — ABNORMAL LOW (ref 135–145)
Total Bilirubin: 0.6 mg/dL (ref 0.3–1.2)
Total Protein: 6.8 g/dL (ref 6.0–8.3)

## 2012-12-23 LAB — HEMOGLOBIN A1C
Hgb A1c MFr Bld: 6.1 % — ABNORMAL HIGH (ref <5.7)
Mean Plasma Glucose: 128 mg/dL — ABNORMAL HIGH (ref <117)

## 2012-12-23 LAB — URIC ACID: Uric Acid, Serum: 6 mg/dL (ref 2.4–7.0)

## 2012-12-23 MED ORDER — ALPRAZOLAM 0.5 MG PO TABS: 0.5000 mg | ORAL_TABLET | Freq: Two times a day (BID) | ORAL | Status: DC

## 2012-12-24 NOTE — Progress Notes (Signed)
Patient ID: Madeline Wood MRN: [id number], DOB: 24-Sep-1945, 67 y.o. Date of Encounter: @DATE @  Chief Complaint:  Chief Complaint  Patient presents with  . routine f/u med refills    is fasting   Symptoms. HPI: 67 y.o. year old female  presents for routine f/u OV.  Saw Dr. Alanda Amass last week. Had swelling in her ankles. He increased maxzide from half to whole. Did echo but results are pending. Says he did not do labs.  O/W has been feeling good. No complaints today.   Taking Lipitor as directed. No myalgias. On ASA, Plavix. No bleeding. No melena or hematochezia. On BP meds. Taking as directed. No adv effects.  GERD controlled with omeprazole. No angina symptoms.     Past Medical History  Diagnosis Date  . Hypertension   . Gout   . Hyperlipidemia   . 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 Anxiety   . Obesity   . Knee pain   . Positive TB test   . Hiatal hernia   . GERD (gastroesophageal reflux disease)   . CAD (coronary artery disease)   . Diabetes mellitus without complication      Home Meds: Current Outpatient Prescriptions on File Prior to Visit  Medication Sig Dispense Refill  . aspirin 81 MG tablet Take 81 mg by mouth daily.        [name] atorvastatin (LIPITOR) 10 MG tablet Take 10 mg by mouth daily.        . cetirizine (ZYRTEC) 10 MG tablet Take 10 mg by mouth daily.        . clopidogrel (PLAVIX) 75 MG tablet Take 75 mg by mouth daily.        [name] ezetimibe (ZETIA) 10 MG tablet Take 10 mg by mouth daily.        . fish oil-omega-3 fatty acids 1000 MG capsule Take 2 g by mouth daily.        [name] guaiFENesin (MUCINEX) 600 MG 12 hr tablet Take 600 mg by mouth 2 (two) times daily as needed.       [name] losartan (COZAAR) 100 MG tablet Take 100 mg by mouth daily.        . metoprolol (LOPRESSOR) 50 MG tablet One tablet in AM.  One and 1/2 tab in the evening  90 tablet  0  . nitroGLYCERIN (NITRODUR - DOSED IN MG/24 HR) 0.4 mg/hr Place 1 patch onto the skin daily.        [name] omeprazole (PRILOSEC  OTC) 20 MG tablet Take 20 mg by mouth daily.        [name] triamterene-hydrochlorothiazide (MAXZIDE-25) 37.5-25 MG per tablet Take 1 tablet by mouth daily.        [name] HYDROcodone-acetaminophen (NORCO) 7.5-325 MG per tablet Take 1 tablet by mouth every 6 (six) hours as needed.        [name] OVER THE COUNTER MEDICATION Over the counter cough medication       . vitamin B-12 (CYANOCOBALAMIN) 50 MCG tablet Take 50 mcg by mouth daily.         No current facility-administered medications on file prior to visit.    Allergies:  Allergies  Allergen Reactions  . Ciprofloxacin   . Citalopram Hydrobromide   . Escitalopram Oxalate   . Paroxetine   . Polysporin (Bacitracin-Polymyxin B)    Works as a Agricultural consultant with newborns History   Social History  . Marital Status: Single    Spouse Name: N/A    Number  of Children: N/A  . Years of Education: N/A   Occupational History  . Not on file.   Social History Main Topics  . Smoking status: Former Smoker    Quit date: 12/20/2001  . Smokeless tobacco: Never Used  . Alcohol Use: No  . Drug Use: No  . Sexually Active: Not on file   Other Topics Concern  . Not on file   Social History Narrative  . No narrative on file    Family History  Problem Relation Age of Onset  . Diabetes Sister   . Cancer Brother     colon  . Cancer Paternal Uncle     colon     Review of Systems: Constitutional: negative for chills, fever, night sweats, weight changes, or fatigue  HEENT: negative for vision changes, hearing loss, congestion, rhinorrhea, ST, epistaxis, or sinus pressure Cardiovascular: negative for chest pain or palpitations. No new/increased shortness of breath or dyspnea on exertion Respiratory: negative for hemoptysis, wheezing, shortness of breath, or cough Abdominal: negative for abdominal pain, nausea, vomiting, diarrhea, or constipation Dermatological: negative for rash or concerning skin lesions Neurologic: negative for headache,  dizziness, or syncope All other systems reviewed and are otherwise negative with the exception to those above and in the HPI.   Physical Exam: Blood pressure 118/74, pulse 60, temperature 97.3 F (36.3 C), temperature source Oral, resp. rate 18, height 5' 5.25" (1.657 m), weight 243 lb (110.224 kg)., Body mass index is 40.14 kg/(m^2). General: Obese AAF. Appears in no acute distress. Head: Normocephalic, atraumatic, eyes without discharge, sclera non-icteric, nares are without discharge. Bilateral auditory canals clear, TM's are without perforation, pearly grey and translucent with reflective cone of light bilaterally. Oral cavity moist, posterior pharynx without exudate, erythema, peritonsillar abscess, or post nasal drip.  Neck: Supple. No thyromegaly. Full ROM. No lymphadenopathy.No carotid artery bruit. Lungs: Clear bilaterally to auscultation without wheezes, rales, or rhonchi. Breathing is unlabored. Heart: RRR with S1 S2. I-II/VI murmur at left sternal border. No, rubs, or gallops. Abdomen: Soft, non-tender, non-distended with normoactive bowel sounds. No hepatomegaly. No rebound/guarding. No obvious abdominal masses. Musculoskeletal:  Strength and tone normal for age. Extremities/Skin: Warm and dry. No clubbing or cyanosis. No edema. No rashes or suspicious lesions. Neuro: Alert and oriented X 3. Moves all extremities spontaneously. Gait is normal. CNII-XII grossly in tact. Psych:  Responds to questions appropriately with a normal affect.     ASSESSMENT AND PLAN:  67 y.o. year old female with  1. Diabetes mellitus without complication This has not required medications thus far. Have discussed diet, exercise in past. Has been given low carbohydrate info. - COMPLETE METABOLIC PANEL WITH GFR - Hemoglobin A1c - Uric acid  2. CORONARY ARTERY DISEASE On ASA, Plavix. Check CBC - CBC with Differential  3. Hyperlipidemia On Lipitor 80 and Zetia. - COMPLETE METABOLIC PANEL WITH GFR -  Lipid panel  4. Hypertension At goal. In past Maxzide dose was decreased sec to low BP. Pt will let me know if develops lightheadness. (see hpi-this was increased sec to LE edema) - COMPLETE METABOLIC PANEL WITH GFR  5. Obesity Has been educated reg diet, exercise  6. Anxiety Controlled. Only needs xanax occasionally. - ALPRAZolam (XANAX) 0.5 MG tablet; Take 1 tablet (0.5 mg total) by mouth 2 (two) times daily.  Dispense: 60 tablet; Refill: 0ne  7. GERD (gastroesophageal reflux disease) Controlled with omeprazole  8. Hiatal hernia   9. Personal history of colonic polyps Colonoscopy-05/2010-I do not have report. Pt  says to f/u 5 years.--05/2015  10. Mammogram 04/11/12-neg  11. Immunizations: Tetanus:   01/25/12 Zostavax:   06/02/08 ??   I wil confirm-- Pneumovax; 2012  ROV 6 months, sooner if needed.  [address] [place], [place], [facility] 12/24/2012 4:39 PM

## 2012-12-27 ENCOUNTER — Telehealth: Payer: Self-pay | Admitting: Physician Assistant

## 2012-12-27 MED ORDER — TRIAMTERENE-HCTZ 37.5-25 MG PO TABS: 1.0000 | ORAL_TABLET | Freq: Every day | ORAL | Status: DC

## 2012-12-27 NOTE — Telephone Encounter (Signed)
Pt wanted Maxide in tablet form instead of capsule refilled with tablet form

## 2012-12-27 NOTE — Telephone Encounter (Signed)
Ok to take half maxalt. Let me know if continues to have lightheadedness.

## 2012-12-27 NOTE — Telephone Encounter (Signed)
Pt aware of change in prescription and refilled

## 2012-12-27 NOTE — Telephone Encounter (Signed)
Pt states she is taking a whole pill of maxalt and feels dizzy when she takes the whole thing..she wants to know if you want her to decrease it to half pill instead..she said you told her to let her know if this happens her BP this am was 109/71.

## 2012-12-30 ENCOUNTER — Telehealth: Payer: Self-pay | Admitting: Physician Assistant

## 2012-12-30 MED ORDER — METOPROLOL TARTRATE 50 MG PO TABS: ORAL_TABLET | ORAL | Status: DC

## 2012-12-30 NOTE — Telephone Encounter (Signed)
Medication refilled per protocol. 

## 2013-01-08 ENCOUNTER — Other Ambulatory Visit: Payer: Self-pay | Admitting: Physician Assistant

## 2013-01-08 MED ORDER — OMEPRAZOLE MAGNESIUM 20 MG PO TBEC: 20.0000 mg | DELAYED_RELEASE_TABLET | Freq: Every day | ORAL | Status: DC

## 2013-01-08 NOTE — Telephone Encounter (Signed)
Med refilled.

## 2013-02-04 ENCOUNTER — Telehealth: Payer: Self-pay | Admitting: Family Medicine

## 2013-02-04 DIAGNOSIS — F419 Anxiety disorder, unspecified: Secondary | ICD-10-CM

## 2013-02-04 NOTE — Telephone Encounter (Signed)
Ok to refill 

## 2013-02-04 NOTE — Telephone Encounter (Signed)
?   OK to Refill  

## 2013-02-05 MED ORDER — ALPRAZOLAM 0.5 MG PO TABS: 0.5000 mg | ORAL_TABLET | Freq: Two times a day (BID) | ORAL | Status: DC

## 2013-02-05 NOTE — Telephone Encounter (Signed)
Rx Refilled  

## 2013-02-12 ENCOUNTER — Telehealth: Payer: Self-pay | Admitting: Family Medicine

## 2013-02-13 MED ORDER — METOPROLOL SUCCINATE ER 50 MG PO TB24: 50.0000 mg | ORAL_TABLET | Freq: Every day | ORAL | Status: DC

## 2013-02-13 NOTE — Telephone Encounter (Signed)
Old order d/c'd  And new order sent to pharmacy.  Called pt left mess that medicine has been ordered

## 2013-02-13 NOTE — Telephone Encounter (Signed)
Toprol XL 50mg  one po QD # 30/ prn refills.  (? Or 90-i dont know if she wants 90 or 30)

## 2013-02-17 ENCOUNTER — Telehealth: Payer: Self-pay | Admitting: Family Medicine

## 2013-02-18 NOTE — Telephone Encounter (Signed)
Agree/approved 

## 2013-03-19 ENCOUNTER — Ambulatory Visit (INDEPENDENT_AMBULATORY_CARE_PROVIDER_SITE_OTHER): Payer: Medicare Other | Admitting: Physician Assistant

## 2013-03-19 ENCOUNTER — Encounter: Payer: Self-pay | Admitting: Physician Assistant

## 2013-03-19 VITALS — BP 104/70 | HR 72 | Temp 98.4°F | Resp 18 | Wt 245.0 lb

## 2013-03-19 DIAGNOSIS — Z86010 Personal history of colonic polyps: Secondary | ICD-10-CM

## 2013-03-19 DIAGNOSIS — F419 Anxiety disorder, unspecified: Secondary | ICD-10-CM

## 2013-03-19 DIAGNOSIS — R42 Dizziness and giddiness: Secondary | ICD-10-CM

## 2013-03-19 DIAGNOSIS — R5383 Other fatigue: Secondary | ICD-10-CM

## 2013-03-19 DIAGNOSIS — R531 Weakness: Secondary | ICD-10-CM

## 2013-03-19 DIAGNOSIS — M109 Gout, unspecified: Secondary | ICD-10-CM

## 2013-03-19 DIAGNOSIS — R5381 Other malaise: Secondary | ICD-10-CM

## 2013-03-19 DIAGNOSIS — E785 Hyperlipidemia, unspecified: Secondary | ICD-10-CM

## 2013-03-19 DIAGNOSIS — K219 Gastro-esophageal reflux disease without esophagitis: Secondary | ICD-10-CM

## 2013-03-19 DIAGNOSIS — E669 Obesity, unspecified: Secondary | ICD-10-CM

## 2013-03-19 DIAGNOSIS — E119 Type 2 diabetes mellitus without complications: Secondary | ICD-10-CM

## 2013-03-19 DIAGNOSIS — F411 Generalized anxiety disorder: Secondary | ICD-10-CM

## 2013-03-19 DIAGNOSIS — I10 Essential (primary) hypertension: Secondary | ICD-10-CM

## 2013-03-19 MED ORDER — ALPRAZOLAM 0.5 MG PO TABS: 0.5000 mg | ORAL_TABLET | Freq: Two times a day (BID) | ORAL | Status: DC

## 2013-03-19 MED ORDER — LOSARTAN POTASSIUM 50 MG PO TABS: 50.0000 mg | ORAL_TABLET | Freq: Every day | ORAL | Status: DC

## 2013-03-19 NOTE — Progress Notes (Signed)
Patient ID: Madeline Wood MRN: [id number], DOB: 1946/01/17, 67 y.o. Date of Encounter: @DATE @  Chief Complaint:  Chief Complaint  Patient presents with  . x 1 week feelings of weakness and lightheaded    HPI: 66 y.o. year old AA female  presents with c/o feeling more tired and weak than usual for past week. Also feels lightheaded at times-only when standing, walking around-never when sitting or lying.   Has been getting normal amount and quality of sleep. Has had no increased stress. 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 The only med change recently was ; she says in May Cardiology increased her MAxzide from half to whole sec to LE edema.  Also says Echo was done to make sure Edema not sec to CHF-Pt says echo was ok. She has thought this may be cause of her symptoms so she went back to taking only half pill past 3 days. Feels a little better.   O/w has been feeling good.  Taking chol meds. No myalgias etc. No angina symptoms.   Past Medical History  Diagnosis Date  . Hypertension   . Gout   . Hyperlipidemia   . 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 Anxiety   . Obesity   . Knee pain   . Positive TB test   . Hiatal hernia   . GERD (gastroesophageal reflux disease)   . CAD (coronary artery disease)   . Diabetes mellitus without complication      Home Meds: See attached medication section for current medication list. Any medications entered into computer today will not appear on this note's list. The medications listed below were entered prior to today. Current Outpatient Prescriptions on File Prior to Visit  Medication Sig Dispense Refill  . aspirin 81 MG tablet Take 81 mg by mouth daily.        [name] atorvastatin (LIPITOR) 10 MG tablet Take 10 mg by mouth daily.        . cetirizine (ZYRTEC) 10 MG tablet Take 10 mg by mouth daily.        . clopidogrel (PLAVIX) 75 MG tablet Take 75 mg by mouth daily.        [name] ezetimibe (ZETIA) 10 MG tablet Take 10 mg  by mouth daily.        . fish oil-omega-3 fatty acids 1000 MG capsule Take 2 g by mouth daily.        . metoprolol (LOPRESSOR) 50 MG tablet 50 mg. 1 tab po qam and 1.5 tabs qpm      . Multiple Vitamin (MULTIVITAMIN) tablet Take 1 tablet by mouth daily.      [name] omeprazole (PRILOSEC OTC) 20 MG tablet Take 1 tablet (20 mg total) by mouth daily.  30 tablet  1  . triamterene-hydrochlorothiazide (MAXZIDE-25) 37.5-25 MG per tablet Take 1 tablet by mouth daily.  30 tablet  2   No current facility-administered medications on file prior to visit.    Allergies:  Allergies  Allergen Reactions  . Ciprofloxacin   . Citalopram Hydrobromide   . Escitalopram Oxalate   . Paroxetine   . Polysporin (Bacitracin-Polymyxin B)     History   Social History  . Marital Status: Single    Spouse Name: N/A    Number of Children: N/A  . Years of Education: N/A   Occupational History  . Not on file.   Social History Main Topics  . Smoking status: Former Smoker  Quit date: 12/20/2001  . Smokeless tobacco: Never Used  . Alcohol Use: No  . Drug Use: No  . Sexually Active: Not on file   Other Topics Concern  . Not on file   Social History Narrative  . No narrative on file    Family History  Problem Relation Age of Onset  . Diabetes Sister   . Cancer Brother     colon  . Cancer Paternal Uncle     colon     Review of Systems:  See HPI for pertinent ROS. All other ROS negative.    Physical Exam: Blood pressure 104/70, pulse 72, temperature 98.4 F (36.9 C), temperature source Oral, resp. rate 18, weight 245 lb (111.131 kg)., Body mass index is 40.48 kg/(m^2). General: Obese AAF. Appears in no acute distress. Neck: Supple. No thyromegaly. No lymphadenopathy.No carotid Bruit. Lungs: Clear bilaterally to auscultation without wheezes, rales, or rhonchi. Breathing is unlabored. Heart: RRR with S1 S2. No murmurs, rubs, or gallops. Abdomen: Soft, non-tender, non-distended with normoactive bowel  sounds. No hepatomegaly. No rebound/guarding. No obvious abdominal masses. Musculoskeletal:  Strength and tone normal for age. Extremities/Skin: Warm and dry. No clubbing or cyanosis. No edema. No rashes or suspicious lesions.No/trace edema. Neuro: Alert and oriented X 3. Moves all extremities spontaneously. Gait is normal. CNII-XII grossly in tact. Psych:  Responds to questions appropriately with a normal affect.     ASSESSMENT AND PLAN:  67 y.o. year old female with  1. Lightheadedness I think this is sec to low BP. Since she needs diuretic for swelling, will decrease Cozaar to avoid hypotension.  Will check labs to r/o other causes. - losartan (COZAAR) 50 MG tablet; Take 1 tablet (50 mg total) by mouth daily.  Dispense: 90 tablet; Refill: 3 - CBC with Differential - COMPLETE METABOLIC PANEL WITH GFR - TSH  2. Weakness See #1 - losartan (COZAAR) 50 MG tablet; Take 1 tablet (50 mg total) by mouth daily.  Dispense: 90 tablet; Refill: 3 - CBC with Differential - COMPLETE METABOLIC PANEL WITH GFR - TSH  3. Hypertension See #1. - losartan (COZAAR) 50 MG tablet; Take 1 tablet (50 mg total) by mouth daily.  Dispense: 90 tablet; Refill: 3  4. Hyperlipidemia FLP at goal 4/14.   5. Diabetes mellitus without complication - Hemoglobin A1c  6. Anxiety Controlled with current meds. - ALPRAZolam (XANAX) 0.5 MG tablet; Take 1 tablet (0.5 mg total) by mouth 2 (two) times daily.  Dispense: 60 tablet; Refill: 1  7. GERD (gastroesophageal reflux disease) controlled  8. Obesity  9. Personal history of colonic polyps  10. Gout   Signed, Shon Hale [place], [place], [facility] 03/19/2013 8:42 AM

## 2013-03-20 ENCOUNTER — Telehealth: Payer: Self-pay | Admitting: Family Medicine

## 2013-03-20 LAB — CBC WITH DIFFERENTIAL/PLATELET
Basophils Absolute: 0 10*3/uL (ref 0.0–0.1)
Basophils Relative: 1 % (ref 0–1)
Eosinophils Absolute: 0.2 10*3/uL (ref 0.0–0.7)
Eosinophils Relative: 3 % (ref 0–5)
HCT: 35.8 % — ABNORMAL LOW (ref 36.0–46.0)
Hemoglobin: 11.9 g/dL — ABNORMAL LOW (ref 12.0–15.0)
Lymphocytes Relative: 44 % (ref 12–46)
Lymphs Abs: 2.7 10*3/uL (ref 0.7–4.0)
MCH: 29.5 pg (ref 26.0–34.0)
MCHC: 33.2 g/dL (ref 30.0–36.0)
MCV: 88.8 fL (ref 78.0–100.0)
Monocytes Absolute: 0.6 10*3/uL (ref 0.1–1.0)
Monocytes Relative: 9 % (ref 3–12)
Neutro Abs: 2.7 10*3/uL (ref 1.7–7.7)
Neutrophils Relative %: 43 % (ref 43–77)
Platelets: 261 10*3/uL (ref 150–400)
RBC: 4.03 MIL/uL (ref 3.87–5.11)
RDW: 14.2 % (ref 11.5–15.5)
WBC: 6.2 10*3/uL (ref 4.0–10.5)

## 2013-03-20 LAB — COMPLETE METABOLIC PANEL WITH GFR
ALT: 11 U/L (ref 0–35)
AST: 20 U/L (ref 0–37)
Albumin: 3.6 g/dL (ref 3.5–5.2)
Alkaline Phosphatase: 43 U/L (ref 39–117)
BUN: 14 mg/dL (ref 6–23)
CO2: 28 mEq/L (ref 19–32)
Calcium: 9.5 mg/dL (ref 8.4–10.5)
Chloride: 101 mEq/L (ref 96–112)
Creat: 0.79 mg/dL (ref 0.50–1.10)
GFR, Est African American: >89 mL/min
GFR, Est Non African American: 78 mL/min
Glucose, Bld: 106 mg/dL — ABNORMAL HIGH (ref 70–99)
Potassium: 4.3 mEq/L (ref 3.5–5.3)
Sodium: 136 mEq/L (ref 135–145)
Total Bilirubin: 0.4 mg/dL (ref 0.3–1.2)
Total Protein: 6.5 g/dL (ref 6.0–8.3)

## 2013-03-20 LAB — HEMOGLOBIN A1C
Hgb A1c MFr Bld: 6.1 % — ABNORMAL HIGH (ref <5.7)
Mean Plasma Glucose: 128 mg/dL — ABNORMAL HIGH (ref <117)

## 2013-03-20 LAB — TSH: TSH: 2.815 u[IU]/mL (ref 0.350–4.500)

## 2013-03-20 NOTE — Telephone Encounter (Signed)
Message copied by Donne Anon on Thu Mar 20, 2013  5:57 PM ------      Message from: Allayne Butcher      Created: Thu Mar 20, 2013 12:43 PM       Labs are essentilally normal. No lab abnormality to explain her symptoms. I really think her lightheadedness and fatigue are sec to the triamterene-we adjusted meds at OV regarding this.       A1C etc are stable, controlled-cont all meds same, other than changes discussed at OV ------

## 2013-03-21 NOTE — Telephone Encounter (Signed)
Pt aware of lab results.  Continue meds as adjusted at office visit.  If symptoms do not resolve call us back

## 2013-03-24 ENCOUNTER — Other Ambulatory Visit: Payer: Self-pay | Admitting: Family Medicine

## 2013-03-24 ENCOUNTER — Other Ambulatory Visit: Payer: Self-pay | Admitting: Physician Assistant

## 2013-03-24 NOTE — Telephone Encounter (Signed)
Se visit from last week.  Is this OK to refill??

## 2013-03-25 NOTE — Telephone Encounter (Signed)
RX to pharmacy for 1/2 tablet daily

## 2013-03-25 NOTE — Telephone Encounter (Signed)
She is supposed to take 1/2 of this daily. May refill for that dose

## 2013-04-13 ENCOUNTER — Other Ambulatory Visit: Payer: Self-pay | Admitting: Internal Medicine

## 2013-04-14 NOTE — Telephone Encounter (Signed)
Rx was sent to pharmacy electronically. 

## 2013-05-21 ENCOUNTER — Telehealth: Payer: Self-pay | Admitting: Family Medicine

## 2013-05-21 NOTE — Telephone Encounter (Signed)
Triamterene-HCTZ 37.5-25 mg tab (pt told pharmacy she is taking 1/2 QD and then 1 every other day)

## 2013-05-28 ENCOUNTER — Ambulatory Visit (INDEPENDENT_AMBULATORY_CARE_PROVIDER_SITE_OTHER): Payer: Medicare Other | Admitting: Family Medicine

## 2013-05-28 ENCOUNTER — Telehealth: Payer: Self-pay | Admitting: Family Medicine

## 2013-05-28 DIAGNOSIS — Z23 Encounter for immunization: Secondary | ICD-10-CM

## 2013-05-28 MED ORDER — TRIAMTERENE-HCTZ 37.5-25 MG PO TABS: ORAL_TABLET | ORAL | Status: DC

## 2013-05-28 NOTE — Telephone Encounter (Signed)
Triamterene-HCTZ 37.5-25 mg tab (pt says she is taking 1/2 every other day and 1 on the other days)

## 2013-05-28 NOTE — Telephone Encounter (Signed)
Rx Refilled  

## 2013-05-29 ENCOUNTER — Other Ambulatory Visit: Payer: Self-pay | Admitting: Physician Assistant

## 2013-05-30 NOTE — Telephone Encounter (Signed)
Last refill 7/23 #60 + 1 RF.   OK refill?

## 2013-06-01 ENCOUNTER — Other Ambulatory Visit: Payer: Self-pay | Admitting: Physician Assistant

## 2013-06-02 NOTE — Telephone Encounter (Signed)
Medication refilled per protocol. 

## 2013-06-02 NOTE — Telephone Encounter (Signed)
RX called in .

## 2013-06-02 NOTE — Telephone Encounter (Signed)
Approved for #60+2 additional refill 

## 2013-06-05 ENCOUNTER — Encounter: Payer: Self-pay | Admitting: *Deleted

## 2013-06-06 ENCOUNTER — Encounter: Payer: Self-pay | Admitting: Internal Medicine

## 2013-06-06 ENCOUNTER — Ambulatory Visit (INDEPENDENT_AMBULATORY_CARE_PROVIDER_SITE_OTHER): Payer: Medicare Other | Admitting: Internal Medicine

## 2013-06-06 VITALS — BP 150/84 | HR 68 | Ht 66.0 in | Wt 250.0 lb

## 2013-06-06 DIAGNOSIS — E669 Obesity, unspecified: Secondary | ICD-10-CM

## 2013-06-06 DIAGNOSIS — R600 Localized edema: Secondary | ICD-10-CM

## 2013-06-06 DIAGNOSIS — E119 Type 2 diabetes mellitus without complications: Secondary | ICD-10-CM

## 2013-06-06 DIAGNOSIS — I10 Essential (primary) hypertension: Secondary | ICD-10-CM

## 2013-06-06 DIAGNOSIS — F419 Anxiety disorder, unspecified: Secondary | ICD-10-CM

## 2013-06-06 DIAGNOSIS — R609 Edema, unspecified: Secondary | ICD-10-CM

## 2013-06-06 DIAGNOSIS — F411 Generalized anxiety disorder: Secondary | ICD-10-CM

## 2013-06-06 DIAGNOSIS — I2510 Atherosclerotic heart disease of native coronary artery without angina pectoris: Secondary | ICD-10-CM

## 2013-06-06 DIAGNOSIS — E785 Hyperlipidemia, unspecified: Secondary | ICD-10-CM

## 2013-06-06 MED ORDER — NITROGLYCERIN 0.4 MG SL SUBL: 0.4000 mg | SUBLINGUAL_TABLET | SUBLINGUAL | Status: DC | PRN

## 2013-06-06 MED ORDER — TRIAMTERENE-HCTZ 37.5-25 MG PO TABS: 1.0000 | ORAL_TABLET | Freq: Every day | ORAL | Status: DC

## 2013-06-06 NOTE — Patient Instructions (Signed)
Follow up in 6 months 

## 2013-06-08 ENCOUNTER — Encounter: Payer: Self-pay | Admitting: Internal Medicine

## 2013-06-08 NOTE — Progress Notes (Signed)
OFFICE NOTE  Chief Complaint:  Recent low blood pressure, lower extremity swelling  Primary Care Physician: Madeline Grosser, MD  HPI:  Madeline Wood is a 67 year old female patient formerly followed by Dr. Alanda Wood, who works as a Oncologist. She has a history of coronary disease and had a drug-eluting stent placed to the distal RCA in 2006. She had a negative Myoview in 2012. Just has a history of obesity, hypertension and dyslipidemia. In 2012 underwent back surgery by Dr. Yevette Wood and has had a good recovery from that.  Recently she had some problems with low blood pressure and had a decrease in her losartan dose to 50 mg once daily which has helped. She has however been having some lower extremity swelling.  When she last saw Dr. Alanda Wood in April 2014, he recommended starting her Dyazide to one full tablet daily. She did not start that given her concern about the change in her blood pressure. It is noted however her blood pressure is slightly higher today 150/84. She is asymptomatic, denying any chest pain, shortness of breath, palpitations, presyncope or syncopal symptoms.  PMHx:  Past Medical History  Diagnosis Date  . Hypertension   . Gout   . Hyperlipidemia   . 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 Anxiety   . Obesity   . Knee pain   . Positive TB test   . Hiatal hernia   . GERD (gastroesophageal reflux disease)   . CAD (coronary artery disease)     stent of distal RCA in 2006  . Diabetes mellitus without complication   . Venous insufficiency   . History of nuclear stress test 03/2011    negative lexiscan myoview; normal perfusion    Past Surgical History  Procedure Laterality Date  . Abdominal hysterectomy    . Breast lumpectomy    . Back surgery  2012  . Coronary angioplasty with stent placement  06/29/2005    Taxus 2.5x51mm DES to distal RCA (Dr. Jenne Wood)  . Transthoracic echocardiogram  12/20/2012    EF 50-55%, normal systolic function, mild hypokinesis of inf  myocardium; calcified MV annulua, LA & RA mildly dilated    FAMHx:  Family History  Problem Relation Age of Onset  . Diabetes Sister   . Cancer Brother     colon  . Cancer Paternal Uncle     colon  . Ovarian cancer Mother   . Other Father     homicide  . Heart attack Brother     x2  . Hypertension Brother   . Hypertension Sister     SOCHx:   reports that she quit smoking about 11 years ago. She has never used smokeless tobacco. She reports that she does not drink alcohol or use illicit drugs.  ALLERGIES:  Allergies  Allergen Reactions  . Ciprofloxacin   . Citalopram Hydrobromide   . Escitalopram Oxalate   . Paroxetine   . Polysporin [Bacitracin-Polymyxin B]     ROS: A comprehensive review of systems was negative except for: Cardiovascular: positive for lower extremity edema and low blood pressure recently  HOME MEDS: Current Outpatient Prescriptions  Medication Sig Dispense Refill  . ALPRAZolam (XANAX) 0.5 MG tablet TAKE 1 TABLET BY MOUTH TWICE A DAY  60 tablet  2  . aspirin 81 MG tablet Take 81 mg by mouth daily.        [name] atorvastatin (LIPITOR) 10 MG tablet Take 80 mg by mouth daily.       . cetirizine (  ZYRTEC) 10 MG tablet Take 10 mg by mouth daily.        . clopidogrel (PLAVIX) 75 MG tablet TAKE 1 TABLET EVERY DAY  30 tablet  8  . ezetimibe (ZETIA) 10 MG tablet Take 10 mg by mouth daily.        . fish oil-omega-3 fatty acids 1000 MG capsule Take 1 g by mouth. Takes occasionally      . losartan (COZAAR) 50 MG tablet Take 1 tablet (50 mg total) by mouth daily.  90 tablet  3  . metoprolol (LOPRESSOR) 50 MG tablet TAKE 1 TABLET BY MOUTH IN THE MORNING AND 1&1/2 IN THE EVENING  90 tablet  5  . Misc Natural Products (GLUCOSAMINE CHOND COMPLEX/MSM PO) Take by mouth. Occasionally      . Multiple Vitamin (MULTIVITAMIN) tablet Take 1 tablet by mouth. occasionally      . nitroGLYCERIN (NITROSTAT) 0.4 MG SL tablet Place 1 tablet (0.4 mg total) under the tongue every 5 (five)  minutes as needed for chest pain.  25 tablet  3  . omeprazole (PRILOSEC) 20 MG capsule TAKE ONE CAPSULE BY MOUTH EVERY DAY  30 capsule  11  . triamterene-hydrochlorothiazide (MAXZIDE-25) 37.5-25 MG per tablet Take 1 tablet by mouth daily. Take 1 tab po daily  30 tablet  11  . [DISCONTINUED] omeprazole (PRILOSEC OTC) 20 MG tablet Take 1 tablet (20 mg total) by mouth daily.  30 tablet  1   No current facility-administered medications for this visit.    LABS/IMAGING: No results found for this or any previous visit (from the past 48 hour(s)). No results found.  VITALS: BP 150/84  Pulse 68  Ht 5\' 6"  (1.676 m)  Wt 250 lb (113.399 kg)  BMI 40.37 kg/m2  EXAM: General appearance: alert and no distress Neck: no adenopathy, no carotid bruit, no JVD, supple, symmetrical, trachea midline and thyroid not enlarged, symmetric, no tenderness/mass/nodules Lungs: clear to auscultation bilaterally Heart: regular rate and rhythm, S1, S2 normal, no murmur, click, rub or gallop Abdomen: soft, non-tender; bowel sounds normal; no masses,  no organomegaly and obese Extremities: edema 1+ bilaterally and no venous stasis changes or varicose veins Pulses: 2+ and symmetric Skin: Skin color, texture, turgor normal. No rashes or lesions Neurologic: Grossly normal Psych: Pleasant mood, affect  EKG: Normal sinus rhythm at 68  ASSESSMENT: 1. Coronary disease status post PCI to the distal RCA in 2006 (DES) 2. Obesity 3. Hypertension-slightly elevated bp 4. Dyslipidemia 5. Lower extremity edema  PLAN: 1.   Madeline Wood is doing fairly well except for some lower sternum and the edema. She is currently taking her Maxzide tablet every other day, but I think she would benefit from taking it on a daily basis. Hopefully this will help a little with her mildly elevated blood pressure as well. Her coronary disease seems stable on her current regimen which is excellent and she is working on weight loss. She will be due  for another cholesterol check in 6 months. I'll plan to see her back at that time.  Madeline Nose, MD, [facility] Attending Cardiologist [facility]  Madeline Wood C 06/08/2013, 6:11 PM

## 2013-06-19 ENCOUNTER — Ambulatory Visit: Payer: Medicare Other | Admitting: Physician Assistant

## 2013-07-30 ENCOUNTER — Other Ambulatory Visit: Payer: Self-pay | Admitting: Family Medicine

## 2013-07-30 ENCOUNTER — Encounter: Payer: Self-pay | Admitting: Family Medicine

## 2013-07-30 DIAGNOSIS — R739 Hyperglycemia, unspecified: Secondary | ICD-10-CM

## 2013-07-30 NOTE — Progress Notes (Signed)
Records indicate patient is due for Urine Micro Albumin.  Reminder letter sent to patient to come have done

## 2013-08-01 ENCOUNTER — Other Ambulatory Visit: Payer: Self-pay | Admitting: *Deleted

## 2013-08-01 MED ORDER — EZETIMIBE 10 MG PO TABS: 10.0000 mg | ORAL_TABLET | Freq: Every day | ORAL | Status: DC

## 2013-08-01 NOTE — Telephone Encounter (Signed)
Rx was sent to pharmacy electronically. 

## 2013-08-18 ENCOUNTER — Encounter: Payer: Self-pay | Admitting: Physician Assistant

## 2013-08-18 ENCOUNTER — Ambulatory Visit (INDEPENDENT_AMBULATORY_CARE_PROVIDER_SITE_OTHER): Payer: Medicare Other | Admitting: Physician Assistant

## 2013-08-18 VITALS — BP 124/80 | HR 60 | Temp 98.4°F | Resp 18 | Ht 66.0 in | Wt 241.0 lb

## 2013-08-18 DIAGNOSIS — Z860100 Personal history of colon polyps, unspecified: Secondary | ICD-10-CM

## 2013-08-18 DIAGNOSIS — R739 Hyperglycemia, unspecified: Secondary | ICD-10-CM

## 2013-08-18 DIAGNOSIS — I2510 Atherosclerotic heart disease of native coronary artery without angina pectoris: Secondary | ICD-10-CM

## 2013-08-18 DIAGNOSIS — F411 Generalized anxiety disorder: Secondary | ICD-10-CM

## 2013-08-18 DIAGNOSIS — R7309 Other abnormal glucose: Secondary | ICD-10-CM

## 2013-08-18 DIAGNOSIS — R609 Edema, unspecified: Secondary | ICD-10-CM

## 2013-08-18 DIAGNOSIS — R600 Localized edema: Secondary | ICD-10-CM

## 2013-08-18 DIAGNOSIS — K449 Diaphragmatic hernia without obstruction or gangrene: Secondary | ICD-10-CM

## 2013-08-18 DIAGNOSIS — E785 Hyperlipidemia, unspecified: Secondary | ICD-10-CM

## 2013-08-18 DIAGNOSIS — Z23 Encounter for immunization: Secondary | ICD-10-CM

## 2013-08-18 DIAGNOSIS — K219 Gastro-esophageal reflux disease without esophagitis: Secondary | ICD-10-CM

## 2013-08-18 DIAGNOSIS — F419 Anxiety disorder, unspecified: Secondary | ICD-10-CM

## 2013-08-18 DIAGNOSIS — E119 Type 2 diabetes mellitus without complications: Secondary | ICD-10-CM

## 2013-08-18 DIAGNOSIS — I10 Essential (primary) hypertension: Secondary | ICD-10-CM

## 2013-08-18 DIAGNOSIS — Z0000 Encounter for general adult medical examination without abnormal findings: Secondary | ICD-10-CM

## 2013-08-18 DIAGNOSIS — E669 Obesity, unspecified: Secondary | ICD-10-CM

## 2013-08-18 LAB — HEMOGLOBIN A1C
Hgb A1c MFr Bld: 6.4 % — ABNORMAL HIGH (ref <5.7)
Mean Plasma Glucose: 137 mg/dL — ABNORMAL HIGH (ref <117)

## 2013-08-18 LAB — COMPLETE METABOLIC PANEL WITH GFR
ALT: 15 U/L (ref 0–35)
AST: 22 U/L (ref 0–37)
Albumin: 4.1 g/dL (ref 3.5–5.2)
Alkaline Phosphatase: 48 U/L (ref 39–117)
BUN: 12 mg/dL (ref 6–23)
CO2: 28 mEq/L (ref 19–32)
Calcium: 9.7 mg/dL (ref 8.4–10.5)
Chloride: 97 mEq/L (ref 96–112)
Creat: 0.73 mg/dL (ref 0.50–1.10)
GFR, Est African American: >89 mL/min
GFR, Est Non African American: 85 mL/min
Glucose, Bld: 101 mg/dL — ABNORMAL HIGH (ref 70–99)
Potassium: 4.7 mEq/L (ref 3.5–5.3)
Sodium: 134 mEq/L — ABNORMAL LOW (ref 135–145)
Total Bilirubin: 0.8 mg/dL (ref 0.3–1.2)
Total Protein: 7.2 g/dL (ref 6.0–8.3)

## 2013-08-18 LAB — CBC WITH DIFFERENTIAL/PLATELET
Basophils Absolute: 0 10*3/uL (ref 0.0–0.1)
Basophils Relative: 0 % (ref 0–1)
Eosinophils Absolute: 0.1 10*3/uL (ref 0.0–0.7)
Eosinophils Relative: 2 % (ref 0–5)
HCT: 38.9 % (ref 36.0–46.0)
Hemoglobin: 13.3 g/dL (ref 12.0–15.0)
Lymphocytes Relative: 34 % (ref 12–46)
Lymphs Abs: 2.1 10*3/uL (ref 0.7–4.0)
MCH: 29.5 pg (ref 26.0–34.0)
MCHC: 34.2 g/dL (ref 30.0–36.0)
MCV: 86.3 fL (ref 78.0–100.0)
Monocytes Absolute: 0.5 10*3/uL (ref 0.1–1.0)
Monocytes Relative: 8 % (ref 3–12)
Neutro Abs: 3.4 10*3/uL (ref 1.7–7.7)
Neutrophils Relative %: 56 % (ref 43–77)
Platelets: 255 10*3/uL (ref 150–400)
RBC: 4.51 MIL/uL (ref 3.87–5.11)
RDW: 14.1 % (ref 11.5–15.5)
WBC: 6.1 10*3/uL (ref 4.0–10.5)

## 2013-08-18 LAB — LIPID PANEL
Cholesterol: 142 mg/dL (ref 0–200)
HDL: 60 mg/dL (ref >39)
LDL Cholesterol: 70 mg/dL (ref 0–99)
Total CHOL/HDL Ratio: 2.4 Ratio
Triglycerides: 58 mg/dL (ref <150)
VLDL: 12 mg/dL (ref 0–40)

## 2013-08-18 LAB — MICROALBUMIN, URINE: Microalb, Ur: 0.5 mg/dL (ref 0.00–1.89)

## 2013-08-18 MED ORDER — TRIAMTERENE-HCTZ 37.5-25 MG PO TABS: 1.0000 | ORAL_TABLET | Freq: Every day | ORAL | Status: DC

## 2013-08-18 NOTE — Progress Notes (Signed)
Patient ID: Madeline Wood MRN: [id number], DOB: [birth year], 67 y.o. Date of Encounter: 08/18/2013,   Chief Complaint: Physical (CPE)  HPI: 67 y.o. y/o AA female  here for CPE.   She has no complaints today. She recently saw cardiology. Says they make no changes other than having her take her triamterene HCTZ every day instead of every other day. She says she has had no lightheadedness since I decreased dose of her Cozaar at her visit here 03/19/13. She is 67 y.o.  She is taking all blood pressure medications as directed. No lightheadedness now. No other adverse effects.  She is on cholesterol medication as directed. No myalgias or other adverse effects.   Review of Systems: Consitutional: No fever, chills, fatigue, night sweats, lymphadenopathy. No significant/unexplained weight changes. Eyes: No visual changes, eye redness, or discharge. ENT/Mouth: No ear pain, sore throat, nasal drainage, or sinus pain. Cardiovascular: No chest pressure,heaviness, tightness or squeezing, even with exertion. No increased shortness of breath or dyspnea on exertion.No palpitations, edema, orthopnea, PND. Respiratory: No cough, hemoptysis, SOB, or wheezing. Gastrointestinal: No anorexia, dysphagia, reflux, pain, nausea, vomiting, hematemesis, diarrhea, constipation, BRBPR, or melena. Breast: No mass, nodules, bulging, or retraction. No skin changes or inflammation. No nipple discharge. No lymphadenopathy. Genitourinary: No dysuria, hematuria, incontinence, vaginal discharge, pruritis, burning, abnormal bleeding, or pain. Musculoskeletal: No decreased ROM, No joint pain or swelling. No significant pain in neck, back, or extremities. Skin: No rash, pruritis, or concerning lesions. Neurological: No headache, dizziness, syncope, seizures, tremors, memory loss, coordination problems, or paresthesias. Psychological: No anxiety, depression, hallucinations, SI/HI. Endocrine: No polydipsia, polyphagia, polyuria, or known  diabetes.No increased fatigue. No palpitations/rapid heart rate. No significant/unexplained weight change. All other systems were reviewed and are otherwise negative.  Past Medical History  Diagnosis Date  . Hypertension   . Gout   . Hyperlipidemia   . 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 Anxiety   . Obesity   . Knee pain   . Positive TB test   . Hiatal hernia   . GERD (gastroesophageal reflux disease)   . CAD (coronary artery disease)     stent of distal RCA in 2006  . Diabetes mellitus without complication   . Venous insufficiency   . History of nuclear stress test 03/2011    negative lexiscan myoview; normal perfusion     Past Surgical History  Procedure Laterality Date  . Abdominal hysterectomy    . Breast lumpectomy    . Back surgery  2012  . Coronary angioplasty with stent placement  06/29/2005    Taxus 2.5x52mm DES to distal RCA (Dr. Jenne Campus)  . Transthoracic echocardiogram  12/20/2012    EF 50-55%, normal systolic function, mild hypokinesis of inf myocardium; calcified MV annulua, LA & RA mildly dilated    Home Meds:  Current Outpatient Prescriptions on File Prior to Visit  Medication Sig Dispense Refill  . ALPRAZolam (XANAX) 0.5 MG tablet TAKE 1 TABLET BY MOUTH TWICE A DAY  60 tablet  2  . aspirin 81 MG tablet Take 81 mg by mouth daily.        . cetirizine (ZYRTEC) 10 MG tablet Take 10 mg by mouth daily as needed.       . clopidogrel (PLAVIX) 75 MG tablet TAKE 1 TABLET EVERY DAY  30 tablet  8  . ezetimibe (ZETIA) 10 MG tablet Take 1 tablet (10 mg total) by mouth daily.  30 tablet  10  . losartan (COZAAR) 50 MG tablet Take 1 tablet (50 mg total)  by mouth daily.  90 tablet  3  . metoprolol (LOPRESSOR) 50 MG tablet TAKE 1 TABLET BY MOUTH IN THE MORNING AND 1&1/2 IN THE EVENING  90 tablet  5  . Misc Natural Products (GLUCOSAMINE CHOND COMPLEX/MSM PO) Take by mouth. Occasionally      . Multiple Vitamin (MULTIVITAMIN) tablet Take 1 tablet by mouth. occasionally      . nitroGLYCERIN  (NITROSTAT) 0.4 MG SL tablet Place 1 tablet (0.4 mg total) under the tongue every 5 (five) minutes as needed for chest pain.  25 tablet  3  . omeprazole (PRILOSEC) 20 MG capsule TAKE ONE CAPSULE BY MOUTH EVERY DAY  30 capsule  11  . fish oil-omega-3 fatty acids 1000 MG capsule Take 1 g by mouth. Takes occasionally      . [DISCONTINUED] omeprazole (PRILOSEC OTC) 20 MG tablet Take 1 tablet (20 mg total) by mouth daily.  30 tablet  1   No current facility-administered medications on file prior to visit.    Allergies:  Allergies  Allergen Reactions  . Ciprofloxacin   . Citalopram Hydrobromide   . Escitalopram Oxalate   . Paroxetine   . Polysporin [Bacitracin-Polymyxin B]     History   Social History  . Marital Status: Single    Spouse Name: N/A    Number of Children: 1  . 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 Smoking status: Former Smoker    Quit date: 12/20/2001  . Smokeless tobacco: Never Used  . Alcohol Use: No  . Drug Use: No  . Sexual Activity: Not on file   Other Topics Concern  . Not on file   Social History Narrative  . No narrative on file    Family History  Problem Relation Age of Onset  . Diabetes Sister   . Cancer Brother     colon  . Cancer Paternal Uncle     colon  . Ovarian cancer Mother   . Other Father     homicide  . Heart attack Brother     x2  . Hypertension Brother   . Hypertension Sister     Physical Exam: Blood pressure 124/80, pulse 60, temperature 98.4 F (36.9 C), temperature source Oral, resp. rate 18, height 5\' 6"  (1.676 m), weight 241 lb (109.317 kg)., Body mass index is 38.92 kg/(m^2). General: Obese AAF. Appears in no acute distress. HEENT: Normocephalic, atraumatic. Conjunctiva pink, sclera non-icteric. Pupils 2 mm constricting to 1 mm, round, regular, and equally reactive to light and accomodation. EOMI. Internal auditory canal clear. TMs with good cone of light and  without pathology. Nasal mucosa pink. Nares are without discharge. No sinus tenderness. Oral mucosa pink.  Pharynx without exudate.   Neck: Supple. Trachea midline. No thyromegaly. Full ROM. No lymphadenopathy.No Carotid Bruits. Lungs: Clear to auscultation bilaterally without wheezes, rales, or rhonchi. Breathing is of normal effort and unlabored. Cardiovascular: RRR with S1 S2. No murmurs, rubs, or gallops. Distal pulses 2+ symmetrically. No carotid or abdominal bruits. Breast: Symmetrical. No masses. Nipples without discharge. She does have fibrocystic type changes at the medial aspects of her breasts bilaterally. Abdomen: Soft, non-tender, non-distended with normoactive bowel sounds. No hepatosplenomegaly or masses. No rebound/guarding. No CVA tenderness. No hernias.  Genitourinary:  External genitalia without lesions. Vaginal mucosa pink.No discharge present. She has history of complete hysterectomy. Exam is consistent with this. No masses. Musculoskeletal: Full range of motion and 5/5 strength  throughout. Without swelling, atrophy, tenderness, crepitus, or warmth. Extremities without clubbing, cyanosis, or edema. Calves supple. Skin: Warm and moist without erythema, ecchymosis, wounds, or rash. Neuro: A+Ox3. CN II-XII grossly intact. Moves all extremities spontaneously. Full sensation throughout. Normal gait. DTR 2+ throughout upper and lower extremities. Finger to nose intact. Psych:  Responds to questions appropriately with a normal affect.   Assessment/Plan:  67 y.o. y/o female here for CPE 1. Visit for preventive health examination  A. Screening labs: We just did a TSH with her last labs 7/14. We'll not repeat this now.  - CBC with Differential - COMPLETE METABOLIC PANEL WITH GFR - Lipid panel  B. Pap: No Pap indicated. History of complete hysterectomy. No cancer.  C. Screening Mammogram: Patient uncertain date of last mammogram. I cannot find one recently documented in her paper  chart or in Epic. I have given her the phone number for [facility]. She is to call them and verify whether this is due . If She has had one in the past year, please have them send [place] a copy so we can scan into Epic. If she has not had one in one year then she is to schedule this.  D. DEXA/BMD: Reviewed her chart and do not see that she has had one of these in the past. We will discuss this at her next office visit and schedule this at that time. She has no risk factors for osteoporosis other than having a hysterectomy at an early age.   E. Colorectal Cancer Screening: Last colonoscopy was October 2011. I do not have the report. However patient states that she is to repeat 5 years which means this is due October 2016.  F. Immunizations:  Influenza: Patient deferred Tetanus: He received Tdap Here 01/25/2012 Pneumococcal: I cannot find it documented that she has had this ever discussed going ahead and start her pneumonia vaccines today. Give Prevnar 13 now. In 6-12 months then she will get the Pneumovax 23. Zostavax: Today I told her to go ahead and call her insurance company regarding cost. If she is agreeable with the cost and she will call us to get a prescription sent to her pharmacy for this.   2. CORONARY ARTERY DISEASE Is on aspirin and Plavix. We'll get CBC to monitor - CBC with Differential  3. Diabetes mellitus without complication  - COMPLETE METABOLIC PANEL WITH GFR - Hemoglobin A1c  4. Hypertension Blood pressure at goal. On ARB. Check labs monitor. - COMPLETE METABOLIC PANEL WITH GFR  5. Edema leg Controlled with Maxzide. Labs monitor. - triamterene-hydrochlorothiazide (MAXZIDE-25) 37.5-25 MG per tablet; Take 1 tablet by mouth daily. Take 1 tab po daily  Dispense: 30 tablet; Refill: 11 - COMPLETE METABOLIC PANEL WITH GFR  6. Hyperlipidemia SHe is on statin. Check labs monitor. - COMPLETE METABOLIC PANEL WITH GFR - Lipid panel  7. GERD  8. Personal history of colonic polyps-  see #1 above  9. Anxiety Controlled with current medication.  10. Obesity We have discussed diet and exercise multiple times.  11. Hiatal hernia  12. GERD (gastroesophageal reflux disease) Controlled with omeprazole.  Regular office visit 6 months or sooner if needed.  On her AVS, I wrote out 2 "homework" instructions:  1 Call [facility] regarding mammogram. I wrote out their phone  number for her to call. 2. call insurance regarding coverage of shingles vaccine/Zostavax.   Signed, [address] [place], [place], [facility] 08/18/2013 11:19 AM

## 2013-08-27 ENCOUNTER — Encounter: Payer: Self-pay | Admitting: Physician Assistant

## 2013-08-27 ENCOUNTER — Ambulatory Visit (INDEPENDENT_AMBULATORY_CARE_PROVIDER_SITE_OTHER): Payer: Medicare Other | Admitting: Physician Assistant

## 2013-08-27 VITALS — BP 142/86 | HR 68 | Temp 97.8°F | Resp 18 | Wt 247.0 lb

## 2013-08-27 DIAGNOSIS — B9689 Other specified bacterial agents as the cause of diseases classified elsewhere: Secondary | ICD-10-CM

## 2013-08-27 DIAGNOSIS — A499 Bacterial infection, unspecified: Secondary | ICD-10-CM

## 2013-08-27 DIAGNOSIS — J988 Other specified respiratory disorders: Secondary | ICD-10-CM

## 2013-08-27 MED ORDER — AZITHROMYCIN 250 MG PO TABS: ORAL_TABLET | ORAL | Status: DC

## 2013-08-27 MED ORDER — FLUTICASONE PROPIONATE 50 MCG/ACT NA SUSP: 2.0000 | Freq: Every day | NASAL | Status: DC

## 2013-08-27 NOTE — Progress Notes (Signed)
Patient ID: Madeline Wood MRN: [id number], DOB: 1946/01/28, 67 y.o. Date of Encounter: 08/27/2013, 12:33 PM    Chief Complaint:  Chief Complaint  Patient presents with  . cold,cough, congestion continues     HPI: 67 y.o. year old AA female  that she started getting sick about 7 days ago. She had a sore throat for one day but that has resolved. Now she is having mostly chest congestion with cough. Also has some head and nasal congestion with mucus. Has had no significant fevers or chills. No ear ache.     Home Meds: See attached medication section for any medications that were entered at today's visit. The computer does not put those onto this list.The following list is a list of meds entered prior to today's visit.   Current Outpatient Prescriptions on File Prior to Visit  Medication Sig Dispense Refill  . ALPRAZolam (XANAX) 0.5 MG tablet TAKE 1 TABLET BY MOUTH TWICE A DAY  60 tablet  2  . aspirin 81 MG tablet Take 81 mg by mouth daily.        [name] atorvastatin (LIPITOR) 80 MG tablet Take 80 mg by mouth daily at 6 PM.      . cetirizine (ZYRTEC) 10 MG tablet Take 10 mg by mouth daily as needed.       . clopidogrel (PLAVIX) 75 MG tablet TAKE 1 TABLET EVERY DAY  30 tablet  8  . ezetimibe (ZETIA) 10 MG tablet Take 1 tablet (10 mg total) by mouth daily.  30 tablet  10  . fish oil-omega-3 fatty acids 1000 MG capsule Take 1 g by mouth. Takes occasionally      . losartan (COZAAR) 50 MG tablet Take 1 tablet (50 mg total) by mouth daily.  90 tablet  3  . metoprolol (LOPRESSOR) 50 MG tablet TAKE 1 TABLET BY MOUTH IN THE MORNING AND 1&1/2 IN THE EVENING  90 tablet  5  . Misc Natural Products (GLUCOSAMINE CHOND COMPLEX/MSM PO) Take by mouth. Occasionally      . Multiple Vitamin (MULTIVITAMIN) tablet Take 1 tablet by mouth. occasionally      . nitroGLYCERIN (NITROSTAT) 0.4 MG SL tablet Place 1 tablet (0.4 mg total) under the tongue every 5 (five) minutes as needed for chest pain.  25 tablet  3    . omeprazole (PRILOSEC) 20 MG capsule TAKE ONE CAPSULE BY MOUTH EVERY DAY  30 capsule  11  . triamterene-hydrochlorothiazide (MAXZIDE-25) 37.5-25 MG per tablet Take 1 tablet by mouth daily. Take 1 tab po daily  30 tablet  11  . [DISCONTINUED] omeprazole (PRILOSEC OTC) 20 MG tablet Take 1 tablet (20 mg total) by mouth daily.  30 tablet  1   No current facility-administered medications on file prior to visit.    Allergies:  Allergies  Allergen Reactions  . Ciprofloxacin   . Citalopram Hydrobromide   . Escitalopram Oxalate   . Paroxetine   . Polysporin [Bacitracin-Polymyxin B]       Review of Systems: See HPI for pertinent ROS. All other ROS negative.    Physical Exam: Blood pressure 142/86, pulse 68, temperature 97.8 F (36.6 C), temperature source Oral, resp. rate 18, weight 247 lb (112.038 kg), SpO2 98.00%., Body mass index is 39.89 kg/(m^2). General:  Obese African American female . Appears in no acute distress. HEENT: Normocephalic, atraumatic, eyes without discharge, sclera non-icteric, nares are without discharge. Bilateral auditory canals clear, TM's are without perforation, pearly grey and translucent with reflective cone  of light bilaterally. Oral cavity moist, posterior pharynx without exudate, erythema, peritonsillar abscess.  Neck: Supple. No thyromegaly. No lymphadenopathy. Lungs: Clear bilaterally to auscultation without wheezes, rales, or rhonchi. Breathing is unlabored. Heart: Regular rhythm. No murmurs, rubs, or gallops. Msk:  Strength and tone normal for age. Extremities/Skin: Warm and dry. No clubbing or cyanosis. No edema. No rashes or suspicious lesions. Neuro: Alert and oriented X 3. Moves all extremities spontaneously. Gait is normal. CNII-XII grossly in tact. Psych:  Responds to questions appropriately with a normal affect.     ASSESSMENT AND PLAN:  67 y.o. year old female with  1. Bacterial respiratory infection - azithromycin (ZITHROMAX) 250 MG tablet;  Day 1: Take 2 daily.  Days 2-5: Take 1 daily.  Dispense: 6 tablet; Refill: 0 - fluticasone (FLONASE) 50 MCG/ACT nasal spray; Place 2 sprays into both nostrils daily.  Dispense: 16 g; Refill: 6 She states that I had prescribed her a nasal spray in the past that really works for her. We'll give Flonase. Recommend Mucinex DM as expectorant. Complete all of the Z-Pak. If symptoms do not resolve after completion and followup.   [address] [place], [place], [facility] 08/27/2013 12:33 PM

## 2013-09-04 ENCOUNTER — Telehealth: Payer: Self-pay | Admitting: Family Medicine

## 2013-09-04 MED ORDER — ALPRAZOLAM 0.5 MG PO TABS: 0.5000 mg | ORAL_TABLET | Freq: Two times a day (BID) | ORAL | Status: DC | PRN

## 2013-09-04 NOTE — Telephone Encounter (Signed)
Pt is wanting to know if she can have dexaplin (couldn't not understand her her phone was going in and out really bad) Call back number is [phone]

## 2013-09-04 NOTE — Telephone Encounter (Signed)
I think this should be xanax and she is due for it. .? OK to Refill

## 2013-09-04 NOTE — Telephone Encounter (Signed)
Xanax refill called in. 

## 2013-09-04 NOTE — Telephone Encounter (Signed)
Approved  for usual quantity +2 additional refills

## 2013-09-05 ENCOUNTER — Telehealth: Payer: Self-pay | Admitting: Family Medicine

## 2013-09-05 MED ORDER — FLUCONAZOLE 150 MG PO TABS: 150.0000 mg | ORAL_TABLET | Freq: Once | ORAL | Status: DC

## 2013-09-05 NOTE — Telephone Encounter (Signed)
Has yeast infection from antibiotic.  Can she have a Diflucan?

## 2013-09-05 NOTE — Telephone Encounter (Signed)
Rx to pharmacy and pt aware 

## 2013-09-05 NOTE — Telephone Encounter (Signed)
Diflucan 150mg po x 1

## 2013-09-08 ENCOUNTER — Telehealth: Payer: Self-pay | Admitting: Family Medicine

## 2013-09-08 NOTE — Telephone Encounter (Signed)
Pt was seen couple weeks ago by Olean Ree for illness and she is not feeling any better and she would like something else called in  Pharmacy is [facility] on [address] Call back number is [phone]

## 2013-09-08 NOTE — Telephone Encounter (Signed)
Forward to MBD 

## 2013-09-08 NOTE — Telephone Encounter (Signed)
Given allergy to cipro, will avoid levaquin.  At [place] I Rxed Azithromycin. Will now Rx: Omnicef 300mg  one po BID x 10 days  # 20 / 0.

## 2013-09-09 MED ORDER — CEFDINIR 300 MG PO CAPS: 300.0000 mg | ORAL_CAPSULE | Freq: Two times a day (BID) | ORAL | Status: DC

## 2013-09-09 NOTE — Telephone Encounter (Signed)
Pt is calling back from yesterday °

## 2013-09-09 NOTE — Telephone Encounter (Signed)
RX to pharmacy.  Pt aware.  If still not better will need to be seen

## 2013-09-22 ENCOUNTER — Telehealth: Payer: Self-pay | Admitting: Cardiovascular Disease

## 2013-09-22 NOTE — Telephone Encounter (Signed)
Need a refill on Atorvastatin 80 mg # 30 please.

## 2013-09-22 NOTE — Telephone Encounter (Signed)
Returned call.  Left message asking that hardy copy request be faxed to [phone] for refills.

## 2013-09-26 ENCOUNTER — Telehealth: Payer: Self-pay | Admitting: Cardiovascular Disease

## 2013-09-26 ENCOUNTER — Telehealth: Payer: Self-pay | Admitting: Internal Medicine

## 2013-09-26 MED ORDER — ATORVASTATIN CALCIUM 80 MG PO TABS: 80.0000 mg | ORAL_TABLET | Freq: Every day | ORAL | Status: DC

## 2013-09-26 NOTE — Telephone Encounter (Signed)
Pt calling now concerning her Lipitor. Says she have been waiting over about 2 weeks and she is completely out.

## 2013-09-26 NOTE — Telephone Encounter (Signed)
Rx was sent to pharmacy electronically. 

## 2013-09-26 NOTE — Telephone Encounter (Signed)
Pharmacist called again,said they are still waiting on okay for Lipitor.

## 2013-09-26 NOTE — Telephone Encounter (Signed)
Returned patient's call. Notified her that I had already sent medication into pharmacy.

## 2013-09-26 NOTE — Telephone Encounter (Signed)
Need refill on her generic Lipitor 80mg  # 30.

## 2013-09-29 ENCOUNTER — Encounter: Payer: Self-pay | Admitting: Physician Assistant

## 2013-09-29 ENCOUNTER — Ambulatory Visit (INDEPENDENT_AMBULATORY_CARE_PROVIDER_SITE_OTHER): Payer: Medicare Other | Admitting: Physician Assistant

## 2013-09-29 VITALS — BP 122/80 | HR 64 | Temp 98.4°F | Resp 18 | Ht 64.5 in | Wt 233.0 lb

## 2013-09-29 DIAGNOSIS — B9689 Other specified bacterial agents as the cause of diseases classified elsewhere: Secondary | ICD-10-CM

## 2013-09-29 DIAGNOSIS — A499 Bacterial infection, unspecified: Secondary | ICD-10-CM

## 2013-09-29 DIAGNOSIS — J988 Other specified respiratory disorders: Secondary | ICD-10-CM

## 2013-09-29 MED ORDER — FLUCONAZOLE 150 MG PO TABS: 150.0000 mg | ORAL_TABLET | Freq: Once | ORAL | Status: DC

## 2013-09-29 MED ORDER — LEVOFLOXACIN 750 MG PO TABS: 750.0000 mg | ORAL_TABLET | Freq: Every day | ORAL | Status: DC

## 2013-09-29 NOTE — Progress Notes (Signed)
Patient ID: Madeline Wood MRN: [id number], DOB: 02-09-1946, 68 y.o. Date of Encounter: 09/29/2013, 11:37 AM    Chief Complaint:  Chief Complaint  Patient presents with  . still sick from end December    taken ABX x 2,  now has yeast infection     HPI: 68 y.o. year old AA female is here for followup. I saw her for an office visit on 08/27/13. At that time she reported she started getting sick about 7 days prior to that visit. She had a sore throat for one day but then that resolved. At the time of the visit she was having mostly chest congestion with cough. Also some head and nasal congestion with mucus. No significant fevers or chills and no earache. At that visit she was prescribed Z-Pak. She took that as directed and completed it. On 09/04/2013 she called with complaints of yeast infection secondary to antibiotic and we called in a Diflucan times one. On 09/08/13 she called reporting that her symptoms were not improved after completing the Z-Pak. At that time I reviewed that she had allergy to Cipro and was afraid to use Levaquin. Prescribed Omnicef 300 mg twice a day x10 days.  Today patient reports that she saw a very minimal change after using the Z-Pak. She states that the [facility] did help a lot and that her nose and head are now clear. However she still has cough productive of beige/yellow-green color phlegm. Still no fevers or chills.      Home Meds: See attached medication section for any medications that were entered at today's visit. The computer does not put those onto this list.The following list is a list of meds entered prior to today's visit.   Current Outpatient Prescriptions on File Prior to Visit  Medication Sig Dispense Refill  . ALPRAZolam (XANAX) 0.5 MG tablet Take 1 tablet (0.5 mg total) by mouth 2 (two) times daily as needed for anxiety.  60 tablet  2  . aspirin 81 MG tablet Take 81 mg by mouth daily.        [name] atorvastatin (LIPITOR) 80 MG tablet Take 1 tablet  (80 mg total) by mouth daily at 6 PM.  30 tablet  9  . cetirizine (ZYRTEC) 10 MG tablet Take 10 mg by mouth daily as needed.       . clopidogrel (PLAVIX) 75 MG tablet TAKE 1 TABLET EVERY DAY  30 tablet  8  . ezetimibe (ZETIA) 10 MG tablet Take 1 tablet (10 mg total) by mouth daily.  30 tablet  10  . fish oil-omega-3 fatty acids 1000 MG capsule Take 1 g by mouth. Takes occasionally      . fluconazole (DIFLUCAN) 150 MG tablet Take 1 tablet (150 mg total) by mouth once.  1 tablet  0  . fluticasone (FLONASE) 50 MCG/ACT nasal spray Place 2 sprays into both nostrils daily.  16 g  6  . losartan (COZAAR) 50 MG tablet Take 1 tablet (50 mg total) by mouth daily.  90 tablet  3  . metoprolol (LOPRESSOR) 50 MG tablet TAKE 1 TABLET BY MOUTH IN THE MORNING AND 1&1/2 IN THE EVENING  90 tablet  5  . Misc Natural Products (GLUCOSAMINE CHOND COMPLEX/MSM PO) Take by mouth. Occasionally      . Multiple Vitamin (MULTIVITAMIN) tablet Take 1 tablet by mouth. occasionally      . nitroGLYCERIN (NITROSTAT) 0.4 MG SL tablet Place 1 tablet (0.4 mg total) under the tongue every 5 (  five) minutes as needed for chest pain.  25 tablet  3  . omeprazole (PRILOSEC) 20 MG capsule TAKE ONE CAPSULE BY MOUTH EVERY DAY  30 capsule  11  . triamterene-hydrochlorothiazide (MAXZIDE-25) 37.5-25 MG per tablet Take 1 tablet by mouth daily. Take 1 tab po daily  30 tablet  11  . azithromycin (ZITHROMAX) 250 MG tablet Day 1: Take 2 daily.  Days 2-5: Take 1 daily.  6 tablet  0  . cefdinir (OMNICEF) 300 MG capsule Take 1 capsule (300 mg total) by mouth 2 (two) times daily.  20 capsule  0  . [DISCONTINUED] omeprazole (PRILOSEC OTC) 20 MG tablet Take 1 tablet (20 mg total) by mouth daily.  30 tablet  1   No current facility-administered medications on file prior to visit.    Allergies:  Allergies  Allergen Reactions  . Ciprofloxacin   . Citalopram Hydrobromide   . Escitalopram Oxalate   . Paroxetine   . Polysporin [Bacitracin-Polymyxin B]        Review of Systems: See HPI for pertinent ROS. All other ROS negative.    Physical Exam: Blood pressure 122/80, pulse 64, temperature 98.4 F (36.9 C), temperature source Oral, resp. rate 18, height 5' 4.5" (1.638 m), weight 233 lb (105.688 kg)., Body mass index is 39.39 kg/(m^2). General: Obese AAF.  Appears in no acute distress. HEENT: Normocephalic, atraumatic, eyes without discharge, sclera non-icteric, nares are without discharge. Bilateral auditory canals clear, TM's are without perforation, pearly grey and translucent with reflective cone of light bilaterally. Oral cavity moist, posterior pharynx without exudate, erythema, peritonsillar abscess. No sinus tenderness with percussion.   Neck: Supple. No thyromegaly. No lymphadenopathy. Lungs: Clear bilaterally to auscultation without wheezes, rales, or rhonchi. Breathing is unlabored. Heart: Regular rhythm. No murmurs, rubs, or gallops. Msk:  Strength and tone normal for age. Extremities/Skin: Warm and dry.  Neuro: Alert and oriented X 3. Moves all extremities spontaneously. Gait is normal. CNII-XII grossly in tact. Psych:  Responds to questions appropriately with a normal affect.     ASSESSMENT AND PLAN:  68 y.o. year old female with  1. Bacterial respiratory infection She reports that she has used Levaquin in the past and had no adverse effects. I told her that if symptoms not completely resolved by the time she completes the Levaquin and to definitely follow up and we would obtain x-ray. SHe is to take one Diflucan now and then repeat another one at the completion of the Levaquin. I recommend that she start probiotics such as Align. She also states that she does eat yogurt daily. Encouraged her to continue this especially while on all these antibiotics - levofloxacin (LEVAQUIN) 750 MG tablet; Take 1 tablet (750 mg total) by mouth daily.  Dispense: 10 tablet; Refill: 0 - fluconazole (DIFLUCAN) 150 MG tablet; Take 1 tablet (150  mg total) by mouth once. Now. Then repeat in 10 days.  Dispense: 2 tablet; Refill: 0   Signed, [address] [place], [place], [facility] 09/29/2013 11:37 AM

## 2013-10-05 ENCOUNTER — Other Ambulatory Visit: Payer: Self-pay | Admitting: Physician Assistant

## 2013-10-05 DIAGNOSIS — I10 Essential (primary) hypertension: Secondary | ICD-10-CM

## 2013-10-06 ENCOUNTER — Ambulatory Visit (INDEPENDENT_AMBULATORY_CARE_PROVIDER_SITE_OTHER): Payer: Medicare Other | Admitting: Physician Assistant

## 2013-10-06 ENCOUNTER — Encounter: Payer: Self-pay | Admitting: Physician Assistant

## 2013-10-06 VITALS — BP 126/80 | HR 68 | Temp 97.5°F | Resp 18 | Ht 65.5 in | Wt 232.0 lb

## 2013-10-06 DIAGNOSIS — L989 Disorder of the skin and subcutaneous tissue, unspecified: Secondary | ICD-10-CM

## 2013-10-06 DIAGNOSIS — D485 Neoplasm of uncertain behavior of skin: Secondary | ICD-10-CM

## 2013-10-06 MED ORDER — HYDROCOD POLST-CHLORPHEN POLST 10-8 MG/5ML PO LQCR: 5.0000 mL | Freq: Two times a day (BID) | ORAL | Status: DC | PRN

## 2013-10-06 NOTE — Progress Notes (Signed)
Patient ID: Madeline Wood MRN: [id number], DOB: 1946-05-24, 68 y.o. Date of Encounter: 10/06/2013, 1:12 PM    Chief Complaint:  Chief Complaint  Patient presents with  . here to have lesion removed from left lower leg  . Medication Refill    wants RF hydrocodone cough medicine     HPI: 68 y.o. year old AA female recently noticed this lesion on the heart the lateral aspect of her left lower leg. Has recently increased in size and she has been concerned about it. She showed it to me at recent office visits and I recommended she come back in for biopsy.     Home Meds: See attached medication section for any medications that were entered at today's visit. The computer does not put those onto this list.The following list is a list of meds entered prior to today's visit.   Current Outpatient Prescriptions on File Prior to Visit  Medication Sig Dispense Refill  . ALPRAZolam (XANAX) 0.5 MG tablet Take 1 tablet (0.5 mg total) by mouth 2 (two) times daily as needed for anxiety.  60 tablet  2  . aspirin 81 MG tablet Take 81 mg by mouth daily.        [name] atorvastatin (LIPITOR) 80 MG tablet Take 1 tablet (80 mg total) by mouth daily at 6 PM.  30 tablet  9  . cetirizine (ZYRTEC) 10 MG tablet Take 10 mg by mouth daily as needed.       . clopidogrel (PLAVIX) 75 MG tablet TAKE 1 TABLET EVERY DAY  30 tablet  8  . ezetimibe (ZETIA) 10 MG tablet Take 1 tablet (10 mg total) by mouth daily.  30 tablet  10  . fish oil-omega-3 fatty acids 1000 MG capsule Take 1 g by mouth. Takes occasionally      . fluconazole (DIFLUCAN) 150 MG tablet Take 1 tablet (150 mg total) by mouth once. Now. Then repeat in 10 days.  2 tablet  0  . fluticasone (FLONASE) 50 MCG/ACT nasal spray Place 2 sprays into both nostrils daily.  16 g  6  . levofloxacin (LEVAQUIN) 750 MG tablet Take 1 tablet (750 mg total) by mouth daily.  10 tablet  0  . losartan (COZAAR) 50 MG tablet Take 1 tablet (50 mg total) by mouth daily.  90 tablet  3    . metoprolol (LOPRESSOR) 50 MG tablet TAKE 1 TABLET BY MOUTH IN THE MORNING AND 1&1/2 IN THE EVENING  90 tablet  5  . Misc Natural Products (GLUCOSAMINE CHOND COMPLEX/MSM PO) Take by mouth. Occasionally      . Multiple Vitamin (MULTIVITAMIN) tablet Take 1 tablet by mouth. occasionally      . nitroGLYCERIN (NITROSTAT) 0.4 MG SL tablet Place 1 tablet (0.4 mg total) under the tongue every 5 (five) minutes as needed for chest pain.  25 tablet  3  . omeprazole (PRILOSEC) 20 MG capsule TAKE ONE CAPSULE BY MOUTH EVERY DAY  30 capsule  11  . triamterene-hydrochlorothiazide (MAXZIDE-25) 37.5-25 MG per tablet Take 1 tablet by mouth daily.  30 tablet  3  . [DISCONTINUED] omeprazole (PRILOSEC OTC) 20 MG tablet Take 1 tablet (20 mg total) by mouth daily.  30 tablet  1   No current facility-administered medications on file prior to visit.    Allergies:  Allergies  Allergen Reactions  . Ciprofloxacin   . Citalopram Hydrobromide   . Escitalopram Oxalate   . Paroxetine   . Polysporin [Bacitracin-Polymyxin B]  Review of Systems: See HPI for pertinent ROS. All other ROS negative.    Physical Exam: Blood pressure 126/80, pulse 68, temperature 97.5 F (36.4 C), temperature source Oral, resp. rate 18, height 5' 5.5" (1.664 m), weight 232 lb (105.235 kg)., Body mass index is 38.01 kg/(m^2). General: An obese African American female. Appears in no acute distress. Lungs: Clear bilaterally to auscultation without wheezes, rales, or rhonchi. Breathing is unlabored. Heart: Regular rhythm. No murmurs, rubs, or gallops. Msk:  Strength and tone normal for age. Extremities/Skin: Warm and dry. Lateral Aspect of right lower leg: approx 0.75 cm diameter papule, raised approximately 4 mm. the color is dark brown and there is scale on the surface. There is no erythema. Neuro: Alert and oriented X 3. Moves all extremities spontaneously. Gait is normal. CNII-XII grossly in tact. Psych:  Responds to questions  appropriately with a normal affect.     ASSESSMENT AND PLAN:  68 y.o. year old female with  1. Skin lesion Site is cleansed with Betadine and alcohol. Site is then anesthetized with lidocaine plus epi. Shave biopsy is then performed. The lesion is sent for pathology. There was very minimal to almost no bleeding. I took oxides stick was used and all bleeding stopped. Dressing was applied to cover the wound. She was told to keep this dry and clean. Our office will call her when we get the results from pathology. SHe is to follow up with [place] if there is any erythema or drainage from the site.   Signed, [address] [place], [place], [facility] 10/06/2013 1:12 PM

## 2013-10-06 NOTE — Addendum Note (Signed)
Addended by: Sharmon Revere on: 10/06/2013 04:52 PM   Modules accepted: Orders

## 2013-10-08 LAB — PATHOLOGY

## 2013-10-15 ENCOUNTER — Encounter: Payer: Self-pay | Admitting: Physician Assistant

## 2013-10-15 ENCOUNTER — Ambulatory Visit (INDEPENDENT_AMBULATORY_CARE_PROVIDER_SITE_OTHER): Payer: Medicare Other | Admitting: Physician Assistant

## 2013-10-15 ENCOUNTER — Ambulatory Visit
Admission: RE | Admit: 2013-10-15 | Discharge: 2013-10-15 | Disposition: A | Discharge status: Home / Self Care | Payer: Medicare Other | Source: Ambulatory Visit | Attending: Physician Assistant | Admitting: Physician Assistant

## 2013-10-15 VITALS — BP 142/84 | HR 68 | Temp 97.0°F | Resp 18 | Wt 238.0 lb

## 2013-10-15 DIAGNOSIS — J209 Acute bronchitis, unspecified: Secondary | ICD-10-CM

## 2013-10-15 MED ORDER — ALBUTEROL SULFATE HFA 108 (90 BASE) MCG/ACT IN AERS: 2.0000 | INHALATION_SPRAY | Freq: Four times a day (QID) | RESPIRATORY_TRACT | Status: DC | PRN

## 2013-10-15 MED ORDER — PREDNISONE 20 MG PO TABS: ORAL_TABLET | ORAL | Status: DC

## 2013-10-15 NOTE — Progress Notes (Signed)
Patient ID: Madeline Wood MRN: [id number], DOB: 08-Aug-1946, 68 y.o. Date of Encounter: 10/15/2013, 12:05 PM    Chief Complaint:  Chief Complaint  Patient presents with  . still sick    cough,congestion     HPI: 68 y.o. year old AA female here for followup of cough and congestion.  Her first office visit with me regarding this issue was on 08/27/13. At that time she reported that she started getting sick about 7 days prior to that visit. She had sore throat for one day but then that resolved. At the time of the visit she was having mostly chest congestion with cough. Also some head and nasal congestion with mucus. No significant fevers or chills and no ear ache. At that visit she was prescribed Z-Pak. She took that as directed and completed it.  On 09/04/13 she called with complaints of yeast infection secondary to antibiotic and we called in and Diflucan x1.  On 09/08/13 she called reporting that her symptoms were not improved after completing the Z-Pak. She had an allergy to Cipro so was afraid to use Levaquin so I prescribed Omnicef 300 mg twice a day x10 days.  She came for followup office visit on 09/29/13. At that visit she reported that she saw very minimal change after using the Z-Pak. She stated that the [facility] did help  a lot in her nose and head --the head/nasal congestion did clear at that time. However at that time she was still having cough productive of beige/yellow green colored phlegm. Still was having no fevers or chills.  At that visit 09/29/13 I prescribed Levaquin. She stated she had used it in the past with no adverse effects. As well I prescribed Diflucan x2 one to take then and one to take at the completion of Levaquin. Also discussed using probiotic such as Align. As well she reported that she eats yogurt daily.  Today she reports that she did complete the Levaquin and had no adverse effects. Says she also used over-the-counter Delsym and Zyrtec but then ultimately  got filled prescription sent in for Tussionex. States that she is still coughing. Sometimes is able to get up some phlegm which she says is just a a beige color- not a real deep yellow or green. At other times she feels as if there is something there that she cannot cough out. Also has noticed some wheezing recently. No fevers or chills. No nasal congestion or nasal mucus.     Home Meds: See attached medication section for any medications that were entered at today's visit. The computer does not put those onto this list.The following list is a list of meds entered prior to today's visit.   Current Outpatient Prescriptions on File Prior to Visit  Medication Sig Dispense Refill  . ALPRAZolam (XANAX) 0.5 MG tablet Take 1 tablet (0.5 mg total) by mouth 2 (two) times daily as needed for anxiety.  60 tablet  2  . aspirin 81 MG tablet Take 81 mg by mouth daily.        [name] atorvastatin (LIPITOR) 80 MG tablet Take 1 tablet (80 mg total) by mouth daily at 6 PM.  30 tablet  9  . cetirizine (ZYRTEC) 10 MG tablet Take 10 mg by mouth daily as needed.       . chlorpheniramine-HYDROcodone (TUSSIONEX PENNKINETIC ER) 10-8 MG/5ML LQCR Take 5 mLs by mouth every 12 (twelve) hours as needed for cough.  115 mL  0  . clopidogrel (PLAVIX)  75 MG tablet TAKE 1 TABLET EVERY DAY  30 tablet  8  . ezetimibe (ZETIA) 10 MG tablet Take 1 tablet (10 mg total) by mouth daily.  30 tablet  10  . fish oil-omega-3 fatty acids 1000 MG capsule Take 1 g by mouth. Takes occasionally      . fluconazole (DIFLUCAN) 150 MG tablet Take 1 tablet (150 mg total) by mouth once. Now. Then repeat in 10 days.  2 tablet  0  . fluticasone (FLONASE) 50 MCG/ACT nasal spray Place 2 sprays into both nostrils daily.  16 g  6  . levofloxacin (LEVAQUIN) 750 MG tablet Take 1 tablet (750 mg total) by mouth daily.  10 tablet  0  . losartan (COZAAR) 50 MG tablet Take 1 tablet (50 mg total) by mouth daily.  90 tablet  3  . metoprolol (LOPRESSOR) 50 MG tablet TAKE 1  TABLET BY MOUTH IN THE MORNING AND 1&1/2 IN THE EVENING  90 tablet  5  . Misc Natural Products (GLUCOSAMINE CHOND COMPLEX/MSM PO) Take by mouth. Occasionally      . Multiple Vitamin (MULTIVITAMIN) tablet Take 1 tablet by mouth. occasionally      . nitroGLYCERIN (NITROSTAT) 0.4 MG SL tablet Place 1 tablet (0.4 mg total) under the tongue every 5 (five) minutes as needed for chest pain.  25 tablet  3  . omeprazole (PRILOSEC) 20 MG capsule TAKE ONE CAPSULE BY MOUTH EVERY DAY  30 capsule  11  . triamterene-hydrochlorothiazide (MAXZIDE-25) 37.5-25 MG per tablet Take 1 tablet by mouth daily.  30 tablet  3  . [DISCONTINUED] omeprazole (PRILOSEC OTC) 20 MG tablet Take 1 tablet (20 mg total) by mouth daily.  30 tablet  1   No current facility-administered medications on file prior to visit.    Allergies:  Allergies  Allergen Reactions  . Ciprofloxacin   . Citalopram Hydrobromide   . Escitalopram Oxalate   . Paroxetine   . Polysporin [Bacitracin-Polymyxin B]       Review of Systems: See HPI for pertinent ROS. All other ROS negative.    Physical Exam: Blood pressure 142/84, pulse 68, temperature 97 F (36.1 C), temperature source Oral, resp. rate 18, weight 238 lb (107.956 kg)., Body mass index is 38.99 kg/(m^2). General: Obese AAF.  Appears in no acute distress. HEENT: Normocephalic, atraumatic, eyes without discharge, sclera non-icteric, nares are without discharge. Bilateral auditory canals clear, TM's are without perforation, pearly grey and translucent with reflective cone of light bilaterally. Oral cavity moist, posterior pharynx without exudate, erythema, peritonsillar abscess.  Neck: Supple. No thyromegaly. No lymphadenopathy. Lungs: Mild high pitched wheeze at the end of expiration scattered throughout bilaterally. Heart: Regular rhythm. No murmurs, rubs, or gallops. Msk:  Strength and tone normal for age. Extremities/Skin: Warm and dry.  Neuro: Alert and oriented X 3. Moves all  extremities spontaneously. Gait is normal. CNII-XII grossly in tact. Psych:  Responds to questions appropriately with a normal affect.     ASSESSMENT AND PLAN:  68 y.o. year old female with  1. Acute bronchitis Will obtain chest x-ray. We'll treat with prednisone and albuterol inhaler. I will followup with her once I get the chest x-ray report later today. As well, she is to followup with me if symptoms worsen or if they do not resolve by completion of the prednisone. - DG Chest 2 View; Future - predniSONE (DELTASONE) 20 MG tablet; Take 3 daily for 2 days, then 2 daily for 2 days, then 1 daily for 2 days.  Dispense: 12 tablet; Refill: 0 - albuterol (PROVENTIL HFA;VENTOLIN HFA) 108 (90 BASE) MCG/ACT inhaler; Inhale 2 puffs into the lungs every 6 (six) hours as needed for wheezing or shortness of breath.  Dispense: 1 Inhaler; Refill: 0   Signed, [address] [place], [place], [facility] 10/15/2013 12:05 PM

## 2013-10-16 ENCOUNTER — Telehealth: Payer: Self-pay | Admitting: Family Medicine

## 2013-10-16 NOTE — Telephone Encounter (Signed)
Pt aware of xray results and provider recommendations 

## 2013-10-16 NOTE — Telephone Encounter (Signed)
Call back number is [phone] Pt wants to know if the results of her chest xray has came back

## 2013-10-16 NOTE — Telephone Encounter (Signed)
Message copied by Olena Mater on Thu Oct 16, 2013  8:19 AM ------      Message from: Dena Billet      Created: Wed Oct 15, 2013  1:37 PM       Tell Patient  chest x-ray shows no abnormalities.      Tell her to take the medications as planned at the office visit.      Tell her to call me if symptoms do not resolve after completion of prednisone( and use of albuterol during that time). ------

## 2013-10-17 ENCOUNTER — Telehealth: Payer: Self-pay | Admitting: Family Medicine

## 2013-10-17 NOTE — Telephone Encounter (Signed)
Agree.  Prednisone should help clear up reaction.  O/w would recommend use Benadryl.

## 2013-10-17 NOTE — Telephone Encounter (Signed)
States used the Albuterol inhaler one time.  Feels she has had bad reaction to it.  Terrible choking cough, eye swelled up.  Made her feel terrible.  Told her not to use any more.  Continue with the Prednisone and cough medicine as needed.

## 2013-11-03 ENCOUNTER — Telehealth: Payer: Self-pay | Admitting: Family Medicine

## 2013-11-03 ENCOUNTER — Encounter: Payer: Self-pay | Admitting: Physician Assistant

## 2013-11-03 DIAGNOSIS — J209 Acute bronchitis, unspecified: Secondary | ICD-10-CM

## 2013-11-03 MED ORDER — PREDNISONE 20 MG PO TABS: 20.0000 mg | ORAL_TABLET | Freq: Two times a day (BID) | ORAL | Status: DC

## 2013-11-03 NOTE — Telephone Encounter (Signed)
RX to pharmacy.  Left patient message with provider recommendations

## 2013-11-03 NOTE — Telephone Encounter (Signed)
She has been treated with Levaquin, prednisone, albuterol. At the last visit we obtained a chest x-ray which was normal. Tell her that I really do not think she needs more antibiotic. Tell her that I will treat her with one more course of prednisone and for her to continue using albuterol --  Use 2 puffs 4 times a day routinely even if she's not feeling as if she's wheezing. If symptoms do not resolve after this she will have to come back in for repeat examination. Send prescription for: Prednisone 20 mg    Take 2 daily for 5 days    # 10  + 0

## 2013-11-03 NOTE — Telephone Encounter (Signed)
Still having wheezing and coughing as had at last visit.  Can you call something in for her??

## 2013-11-03 NOTE — Telephone Encounter (Signed)
Message copied by Olena Mater on Mon Nov 03, 2013 12:19 PM ------      Message from: Lenore Manner      Created: Mon Nov 03, 2013  9:44 AM      Regarding: Speak to you      Contact: [phone]       Pt is wanting to speak to you  ------

## 2013-12-22 ENCOUNTER — Other Ambulatory Visit: Payer: Self-pay | Admitting: Internal Medicine

## 2013-12-22 ENCOUNTER — Other Ambulatory Visit: Payer: Self-pay | Admitting: Physician Assistant

## 2013-12-22 ENCOUNTER — Other Ambulatory Visit: Payer: Self-pay | Admitting: Family Medicine

## 2013-12-22 DIAGNOSIS — K219 Gastro-esophageal reflux disease without esophagitis: Secondary | ICD-10-CM

## 2013-12-22 NOTE — Telephone Encounter (Signed)
Rx was sent to pharmacy electronically. 

## 2013-12-23 MED ORDER — PANTOPRAZOLE SODIUM 40 MG PO TBEC: 40.0000 mg | DELAYED_RELEASE_TABLET | Freq: Every day | ORAL | Status: DC

## 2013-12-23 NOTE — Telephone Encounter (Signed)
Refill denied Pt taking Plavix.  Sending to provider for alternate medication.

## 2013-12-23 NOTE — Telephone Encounter (Signed)
Have left message for patient to call me back.  Need to change Omeprazole to Protonix because she is on Plavix

## 2013-12-23 NOTE — Telephone Encounter (Signed)
Spoke to patient about need to change medication.  If has problem with Pantoprazole, let us know.

## 2013-12-23 NOTE — Telephone Encounter (Signed)
With Plavix, can use either Protonix or Dexilant. Find out which of these her insurance covers.

## 2013-12-28 ENCOUNTER — Other Ambulatory Visit: Payer: Self-pay | Admitting: Physician Assistant

## 2014-01-06 ENCOUNTER — Telehealth: Payer: Self-pay | Admitting: Family Medicine

## 2014-01-06 NOTE — Telephone Encounter (Signed)
Really being bothered by allergies.  Using Zyrtec and Flonase with little symptom relief.   Also has tried OTC Mucinex.  Having cough, tickle in throat.  Home health nurse said sinuses inflamed and had fluid in ears.  What else can she do?

## 2014-01-06 NOTE — Telephone Encounter (Signed)
Message copied by Olena Mater on Tue Jan 06, 2014 10:45 AM ------      Message from: Devoria Glassing      Created: Tue Jan 06, 2014  9:53 AM       [phone]      Patient would like to talk with you regarding her allergies  ------

## 2014-01-07 MED ORDER — MONTELUKAST SODIUM 10 MG PO TABS: 10.0000 mg | ORAL_TABLET | Freq: Every day | ORAL | Status: DC

## 2014-01-07 NOTE — Telephone Encounter (Signed)
Spoke to patient.  Verify no indications of infection.  Rx for Singulair to pharmacy.  Do take in conjunction with Zyrtec.  Only during allergy season when symptoms are at their worst.

## 2014-01-07 NOTE — Telephone Encounter (Signed)
Please talk to her and find out if she is getting any thick dark mucus from her nose to suggest infection rather than allergies. (IF she is, then she needs to sched OV) However if symptoms do seem to be consistent with allergies--clear rhinorrhea, sneezing etc., THEN: Add Singulair 10 mg 1 by mouth daily PRN  #30+3 additional refills. Tell  patient that she can take this once a day during allergy season and she can stop it when allergies are not a problem.

## 2014-01-09 ENCOUNTER — Other Ambulatory Visit: Payer: Self-pay | Admitting: Physician Assistant

## 2014-01-09 NOTE — Telephone Encounter (Signed)
Last OV 10/15/13  Last RF 09/04/13 #60 +2  OK refill?

## 2014-01-09 NOTE — Telephone Encounter (Signed)
Takes 1/2 of 1 mg pill BID.  Called in #30 + 2.

## 2014-01-09 NOTE — Telephone Encounter (Signed)
Approved for #60+2 additional refills 

## 2014-02-03 ENCOUNTER — Other Ambulatory Visit: Payer: Self-pay | Admitting: Physician Assistant

## 2014-02-03 ENCOUNTER — Other Ambulatory Visit: Payer: Self-pay | Admitting: Internal Medicine

## 2014-02-04 ENCOUNTER — Encounter: Payer: Self-pay | Admitting: Family Medicine

## 2014-02-04 ENCOUNTER — Other Ambulatory Visit: Payer: Self-pay | Admitting: Physician Assistant

## 2014-02-04 DIAGNOSIS — I10 Essential (primary) hypertension: Secondary | ICD-10-CM

## 2014-02-04 NOTE — Telephone Encounter (Signed)
Prescription sent to pharmacy.

## 2014-02-04 NOTE — Telephone Encounter (Signed)
Medication refill for one time only.  Patient needs to be seen.  Letter sent for patient to call and schedule 

## 2014-02-04 NOTE — Telephone Encounter (Signed)
Refill appropriate and filled per protocol. 

## 2014-02-25 ENCOUNTER — Encounter: Payer: Self-pay | Admitting: Physician Assistant

## 2014-02-25 ENCOUNTER — Ambulatory Visit (INDEPENDENT_AMBULATORY_CARE_PROVIDER_SITE_OTHER): Payer: Medicare Other | Admitting: Physician Assistant

## 2014-02-25 VITALS — BP 136/84 | HR 60 | Temp 97.7°F | Resp 18 | Wt 222.0 lb

## 2014-02-25 DIAGNOSIS — I2510 Atherosclerotic heart disease of native coronary artery without angina pectoris: Secondary | ICD-10-CM

## 2014-02-25 DIAGNOSIS — F419 Anxiety disorder, unspecified: Secondary | ICD-10-CM

## 2014-02-25 DIAGNOSIS — I10 Essential (primary) hypertension: Secondary | ICD-10-CM

## 2014-02-25 DIAGNOSIS — M25562 Pain in left knee: Secondary | ICD-10-CM

## 2014-02-25 DIAGNOSIS — K219 Gastro-esophageal reflux disease without esophagitis: Secondary | ICD-10-CM

## 2014-02-25 DIAGNOSIS — E119 Type 2 diabetes mellitus without complications: Secondary | ICD-10-CM

## 2014-02-25 DIAGNOSIS — Z86010 Personal history of colonic polyps: Secondary | ICD-10-CM

## 2014-02-25 DIAGNOSIS — M25569 Pain in unspecified knee: Secondary | ICD-10-CM

## 2014-02-25 DIAGNOSIS — E785 Hyperlipidemia, unspecified: Secondary | ICD-10-CM

## 2014-02-25 DIAGNOSIS — M25561 Pain in right knee: Secondary | ICD-10-CM

## 2014-02-25 DIAGNOSIS — F411 Generalized anxiety disorder: Secondary | ICD-10-CM

## 2014-02-25 DIAGNOSIS — K449 Diaphragmatic hernia without obstruction or gangrene: Secondary | ICD-10-CM

## 2014-02-25 DIAGNOSIS — E669 Obesity, unspecified: Secondary | ICD-10-CM

## 2014-02-25 DIAGNOSIS — Z23 Encounter for immunization: Secondary | ICD-10-CM

## 2014-02-25 LAB — CBC WITH DIFFERENTIAL/PLATELET
Basophils Absolute: 0 10*3/uL (ref 0.0–0.1)
Basophils Relative: 0 % (ref 0–1)
Eosinophils Absolute: 0.1 10*3/uL (ref 0.0–0.7)
Eosinophils Relative: 2 % (ref 0–5)
HCT: 37.9 % (ref 36.0–46.0)
Hemoglobin: 12.8 g/dL (ref 12.0–15.0)
Lymphocytes Relative: 44 % (ref 12–46)
Lymphs Abs: 2.2 10*3/uL (ref 0.7–4.0)
MCH: 30.3 pg (ref 26.0–34.0)
MCHC: 33.8 g/dL (ref 30.0–36.0)
MCV: 89.8 fL (ref 78.0–100.0)
Monocytes Absolute: 0.5 10*3/uL (ref 0.1–1.0)
Monocytes Relative: 10 % (ref 3–12)
Neutro Abs: 2.2 10*3/uL (ref 1.7–7.7)
Neutrophils Relative %: 44 % (ref 43–77)
Platelets: 251 10*3/uL (ref 150–400)
RBC: 4.22 MIL/uL (ref 3.87–5.11)
RDW: 13.4 % (ref 11.5–15.5)
WBC: 5.1 10*3/uL (ref 4.0–10.5)

## 2014-02-25 LAB — COMPLETE METABOLIC PANEL WITH GFR
ALT: 10 U/L (ref 0–35)
AST: 21 U/L (ref 0–37)
Albumin: 3.9 g/dL (ref 3.5–5.2)
Alkaline Phosphatase: 38 U/L — ABNORMAL LOW (ref 39–117)
BUN: 16 mg/dL (ref 6–23)
CO2: 27 mEq/L (ref 19–32)
Calcium: 9.6 mg/dL (ref 8.4–10.5)
Chloride: 98 mEq/L (ref 96–112)
Creat: 0.87 mg/dL (ref 0.50–1.10)
GFR, Est African American: 79 mL/min
GFR, Est Non African American: 69 mL/min
Glucose, Bld: 96 mg/dL (ref 70–99)
Potassium: 4 mEq/L (ref 3.5–5.3)
Sodium: 135 mEq/L (ref 135–145)
Total Bilirubin: 0.4 mg/dL (ref 0.2–1.2)
Total Protein: 6.8 g/dL (ref 6.0–8.3)

## 2014-02-25 LAB — HEMOGLOBIN A1C
Hgb A1c MFr Bld: 6.3 % — ABNORMAL HIGH (ref <5.7)
Mean Plasma Glucose: 134 mg/dL — ABNORMAL HIGH (ref <117)

## 2014-02-25 LAB — LIPID PANEL
Cholesterol: 206 mg/dL — ABNORMAL HIGH (ref 0–200)
HDL: 60 mg/dL (ref >39)
LDL Cholesterol: 134 mg/dL — ABNORMAL HIGH (ref 0–99)
Total CHOL/HDL Ratio: 3.4 Ratio
Triglycerides: 59 mg/dL (ref <150)
VLDL: 12 mg/dL (ref 0–40)

## 2014-02-25 NOTE — Addendum Note (Signed)
Addended by: Olena Mater on: 02/25/2014 10:17 AM   Modules accepted: Orders

## 2014-02-25 NOTE — Progress Notes (Signed)
Patient ID: Madeline Wood MRN: [id number], DOB: 1946/05/23, 68 y.o. Date of Encounter: 02/25/2014,   Chief Complaint: Physical (CPE)  HPI: 68 y.o. y/o AA female  here for CPE.   She has no complaints today. She says he is due for routine cardiology followup. Last office visit with them was just prior to her last routine visit with me, which was 08/18/13.  She is taking all blood pressure medications as directed. No lightheadedness now. No other adverse effects.  She is on cholesterol medication as directed. No myalgias or other adverse effects.  She has had no angina symptoms, even with exertion.  Review of Systems: Consitutional: No fever, chills, fatigue, night sweats, lymphadenopathy. No significant/unexplained weight changes. Eyes: No visual changes, eye redness, or discharge. ENT/Mouth: No ear pain, sore throat, nasal drainage, or sinus pain. Cardiovascular: No chest pressure,heaviness, tightness or squeezing, even with exertion. No increased shortness of breath or dyspnea on exertion.No palpitations, edema, orthopnea, PND. Respiratory: No cough, hemoptysis, SOB, or wheezing. Gastrointestinal: No anorexia, dysphagia, reflux, pain, nausea, vomiting, hematemesis, diarrhea, constipation, BRBPR, or melena. Breast: No mass, nodules, bulging, or retraction. No skin changes or inflammation. No nipple discharge. No lymphadenopathy. Genitourinary: No dysuria, hematuria, incontinence, vaginal discharge, pruritis, burning, abnormal bleeding, or pain. Musculoskeletal: No decreased ROM, No joint pain or swelling. No significant pain in neck, back, or extremities. Skin: No rash, pruritis, or concerning lesions. Neurological: No headache, dizziness, syncope, seizures, tremors, memory loss, coordination problems, or paresthesias. Psychological: No anxiety, depression, hallucinations, SI/HI. Endocrine: No polydipsia, polyphagia, polyuria, or known diabetes.No increased fatigue. No  palpitations/rapid heart rate. No significant/unexplained weight change. All other systems were reviewed and are otherwise negative.  Past Medical History  Diagnosis Date  . Hypertension   . Gout   . Hyperlipidemia   . 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 Anxiety   . Obesity   . Knee pain   . Positive TB test   . Hiatal hernia   . GERD (gastroesophageal reflux disease)   . CAD (coronary artery disease)     stent of distal RCA in 2006  . Diabetes mellitus without complication   . Venous insufficiency   . History of nuclear stress test 03/2011    negative lexiscan myoview; normal perfusion     Past Surgical History  Procedure Laterality Date  . Abdominal hysterectomy    . Breast lumpectomy    . Back surgery  2012  . Coronary angioplasty with stent placement  06/29/2005    Taxus 2.5x9mm DES to distal RCA (Dr. Tami Ribas)  . Transthoracic echocardiogram  12/20/2012    EF 68-34%, normal systolic function, mild hypokinesis of inf myocardium; calcified MV annulua, LA & RA mildly dilated    Home Meds:  Current Outpatient Prescriptions on File Prior to Visit  Medication Sig Dispense Refill  . ALPRAZolam (XANAX) 0.5 MG tablet Take 1 tablet (0.5 mg total) by mouth 2 (two) times daily as needed for anxiety.  60 tablet  2  . aspirin 81 MG tablet Take 81 mg by mouth daily.        [name] atorvastatin (LIPITOR) 80 MG tablet Take 1 tablet (80 mg total) by mouth daily at 6 PM.  30 tablet  9  . clopidogrel (PLAVIX) 75 MG tablet TAKE 1 TABLET EVERY DAY  30 tablet  6  . ezetimibe (ZETIA) 10 MG tablet Take 1 tablet (10 mg total) by mouth daily.  30 tablet  10  . fish oil-omega-3 fatty acids 1000 MG  capsule Take 1 g by mouth. Takes occasionally      . metoprolol (LOPRESSOR) 50 MG tablet TAKE 1 TABLET BY MOUTH IN THE MORNING AND 1&1/2 IN THE EVENING  90 tablet  0  . Misc Natural Products (GLUCOSAMINE CHOND COMPLEX/MSM PO) Take by mouth. Occasionally      . montelukast (SINGULAIR) 10 MG tablet Take 1 tablet (10 mg  total) by mouth at bedtime.  30 tablet  3  . Multiple Vitamin (MULTIVITAMIN) tablet Take 1 tablet by mouth. occasionally      . nitroGLYCERIN (NITROSTAT) 0.4 MG SL tablet Place 1 tablet (0.4 mg total) under the tongue every 5 (five) minutes as needed for chest pain.  25 tablet  3  . pantoprazole (PROTONIX) 40 MG tablet Take 1 tablet (40 mg total) by mouth daily.  30 tablet  3  . triamterene-hydrochlorothiazide (MAXZIDE-25) 37.5-25 MG per tablet TAKE 1 TABLET BY MOUTH EVERY OTHER DAY AND 1/2 EVERY OTHER DAY ROTATING DAYS  45 tablet  0  . albuterol (PROVENTIL HFA;VENTOLIN HFA) 108 (90 BASE) MCG/ACT inhaler Inhale 2 puffs into the lungs every 6 (six) hours as needed for wheezing or shortness of breath.  1 Inhaler  0  . [DISCONTINUED] omeprazole (PRILOSEC OTC) 20 MG tablet Take 1 tablet (20 mg total) by mouth daily.  30 tablet  1   No current facility-administered medications on file prior to visit.    Allergies:  Allergies  Allergen Reactions  . Ciprofloxacin   . Citalopram Hydrobromide   . Escitalopram Oxalate   . Paroxetine   . Polysporin [Bacitracin-Polymyxin B]     History   Social History  . Marital Status: Single    Spouse Name: N/A    Number of Children: 1  . 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 Smoking status: Former Smoker    Quit date: 12/20/2001  . Smokeless tobacco: Never Used  . Alcohol Use: No  . Drug Use: No  . Sexual Activity: Not on file   Other Topics Concern  . Not on file   Social History Narrative  . No narrative on file    Family History  Problem Relation Age of Onset  . Diabetes Sister   . Cancer Brother     colon  . Cancer Paternal Uncle     colon  . Ovarian cancer Mother   . Other Father     homicide  . Heart attack Brother     x2  . Hypertension Brother   . Hypertension Sister     Physical Exam: Blood pressure 136/84, pulse 60, temperature 97.7 F (36.5 C), temperature  source Oral, resp. rate 18, weight 222 lb (100.699 kg)., Body mass index is 36.37 kg/(m^2). General: Obese AAF. Appears in no acute distress. Neck: Supple. Trachea midline. No thyromegaly. Full ROM. No lymphadenopathy.No Carotid Bruits. Lungs: Clear to auscultation bilaterally without wheezes, rales, or rhonchi. Breathing is of normal effort and unlabored. Cardiovascular: RRR with S1 S2. No murmurs, rubs, or gallops. Distal pulses 2+ symmetrically. No carotid or abdominal bruits. Abdomen: Soft, non-tender, non-distended with normoactive bowel sounds. No hepatosplenomegaly or masses. No rebound/guarding. No CVA tenderness. No hernias.  Musculoskeletal: Normal tone and strenght for age.  Skin: Warm and moist. Neuro: A+Ox3. CN II-XII grossly intact. Moves all extremities spontaneously. Full sensation throughout. Normal gait. DTR 2+ throughout upper and lower extremities. Finger to nose intact. Psych:  Responds to questions appropriately  with a normal affect. Diabetic Foot Exam: Inspection is normal with no open wounds or sores. No calluses or problem areas.    Assessment/Plan:  68 y.o. y/o female here for ROV:  2. CORONARY ARTERY DISEASE Is on aspirin and Plavix. We'll get CBC to monitor - CBC with Differential  3. Diabetes mellitus without complication  - COMPLETE METABOLIC PANEL WITH GFR - Hemoglobin A1c  4. Hypertension Blood pressure at goal. On ARB. Check labs monitor. - COMPLETE METABOLIC PANEL WITH GFR  5. Edema leg Controlled with Maxzide. Labs monitor. - triamterene-hydrochlorothiazide (MAXZIDE-25) 37.5-25 MG per tablet; Take 1 tablet by mouth daily. Take 1 tab po daily  Dispense: 30 tablet; Refill: 11 - COMPLETE METABOLIC PANEL WITH GFR  6. Hyperlipidemia SHe is on statin. Check labs monitor. - COMPLETE METABOLIC PANEL WITH GFR - Lipid panel  7. GERD  8. Personal history of colonic polyps- see below  9. Anxiety Controlled with current medication.  10. Obesity We  have discussed diet and exercise multiple times.  11. Hiatal hernia  12. GERD (gastroesophageal reflux disease) Controlled with omeprazole.   13. preventive health--She had CPE 08/18/2013--THE FOLLOWING IS COPIED FROM THAT OV NOTE  A. Screening labs: We just did a TSH with her last labs 7/14. We'll not repeat this now.  - CBC with Differential - COMPLETE METABOLIC PANEL WITH GFR - Lipid panel  B. Pap: No Pap indicated. History of complete hysterectomy. No cancer.  C. Screening Mammogram: Patient uncertain date of last mammogram. I cannot find one recently documented in her paper chart or in Epic. I have given her the phone number for [facility]. She is to call them and verify whether this is due . If She has had one in the past year, please have them send [place] a copy so we can scan into Epic. If she has not had one in one year then she is to schedule this. AT OV 02/25/2014 SHE REPORTS SHE DID HAVE MAMMOGRAM DONE  D. DEXA/BMD: Reviewed her chart and do not see that she has had one of these in the past. We will discuss this at her next office visit and schedule this at that time. She has no risk factors for osteoporosis other than having a hysterectomy at an early age.   E. Colorectal Cancer Screening: Last colonoscopy was October 2011. I do not have the report. However patient states that she is to repeat 5 years which means this is due October 2016.  F. Immunizations:  Influenza: Patient deferred Tetanus: He received Tdap Here 01/25/2012 Pneumococcal: I cannot find it documented that she has had this ever discussed going ahead and start her pneumonia vaccines today. Give Prevnar 13 now. In 6-12 months then she will get the Pneumovax 23. AT OV 02/25/2014--GIVE PREVNAR 23 TODAY Zostavax: Today I told her to go ahead and call her insurance company regarding cost. If she is agreeable with the cost and she will call us to get a prescription sent to her pharmacy for this.  AT OV 02/25/2014 SHE REPORTS  THAT :She forgot  Regular office visit 6 months or sooner if needed    Signed, Karis Juba, [place], [facility] 02/25/2014 9:15 AM

## 2014-03-07 ENCOUNTER — Other Ambulatory Visit: Payer: Self-pay | Admitting: *Deleted

## 2014-03-07 DIAGNOSIS — I10 Essential (primary) hypertension: Secondary | ICD-10-CM

## 2014-03-07 DIAGNOSIS — E119 Type 2 diabetes mellitus without complications: Secondary | ICD-10-CM

## 2014-03-07 DIAGNOSIS — E785 Hyperlipidemia, unspecified: Secondary | ICD-10-CM

## 2014-04-06 ENCOUNTER — Other Ambulatory Visit: Payer: Self-pay | Admitting: Physician Assistant

## 2014-04-06 NOTE — Telephone Encounter (Signed)
?   Ok to refill, xanax, last refill 11/02/13 last ov 02/25/14

## 2014-04-06 NOTE — Telephone Encounter (Signed)
Yes. May refill for #30+2 additional refills.

## 2014-04-07 NOTE — Telephone Encounter (Signed)
Medication called to pharmacy. 

## 2014-04-15 ENCOUNTER — Encounter: Payer: Self-pay | Admitting: Internal Medicine

## 2014-04-15 ENCOUNTER — Ambulatory Visit (INDEPENDENT_AMBULATORY_CARE_PROVIDER_SITE_OTHER): Payer: Medicare Other | Admitting: Internal Medicine

## 2014-04-15 VITALS — BP 138/76 | HR 63 | Ht 66.0 in | Wt 223.4 lb

## 2014-04-15 DIAGNOSIS — I209 Angina pectoris, unspecified: Secondary | ICD-10-CM

## 2014-04-15 DIAGNOSIS — E785 Hyperlipidemia, unspecified: Secondary | ICD-10-CM

## 2014-04-15 DIAGNOSIS — R600 Localized edema: Secondary | ICD-10-CM

## 2014-04-15 DIAGNOSIS — R609 Edema, unspecified: Secondary | ICD-10-CM

## 2014-04-15 DIAGNOSIS — I2510 Atherosclerotic heart disease of native coronary artery without angina pectoris: Secondary | ICD-10-CM

## 2014-04-15 DIAGNOSIS — I25119 Atherosclerotic heart disease of native coronary artery with unspecified angina pectoris: Secondary | ICD-10-CM

## 2014-04-15 DIAGNOSIS — I10 Essential (primary) hypertension: Secondary | ICD-10-CM

## 2014-04-15 MED ORDER — EZETIMIBE 10 MG PO TABS: 10.0000 mg | ORAL_TABLET | Freq: Every day | ORAL | Status: DC

## 2014-04-15 NOTE — Patient Instructions (Signed)
Your physician recommends that you schedule a follow-up appointment in: One year with Dr. Debara Pickett.

## 2014-04-16 ENCOUNTER — Ambulatory Visit (INDEPENDENT_AMBULATORY_CARE_PROVIDER_SITE_OTHER): Payer: Medicare Other | Admitting: Physician Assistant

## 2014-04-16 ENCOUNTER — Encounter: Payer: Self-pay | Admitting: Physician Assistant

## 2014-04-16 VITALS — BP 136/72 | HR 68 | Temp 98.0°F | Resp 14 | Ht 66.0 in | Wt 223.0 lb

## 2014-04-16 DIAGNOSIS — N760 Acute vaginitis: Secondary | ICD-10-CM

## 2014-04-16 DIAGNOSIS — B3731 Acute candidiasis of vulva and vagina: Secondary | ICD-10-CM

## 2014-04-16 DIAGNOSIS — B373 Candidiasis of vulva and vagina: Secondary | ICD-10-CM

## 2014-04-16 LAB — WET PREP FOR TRICH, YEAST, CLUE
Clue Cells Wet Prep HPF POC: NONE SEEN
Trich, Wet Prep: NONE SEEN

## 2014-04-16 MED ORDER — FLUCONAZOLE 150 MG PO TABS: 150.0000 mg | ORAL_TABLET | Freq: Once | ORAL | Status: DC

## 2014-04-16 NOTE — Progress Notes (Signed)
Patient ID: Madeline Wood MRN: [id number], DOB: [birth year], 68 y.o. Date of Encounter: 04/16/2014, 12:56 PM    Chief Complaint:  Chief Complaint  Patient presents with  . Urgent Care F/U    folliculitis under L arm- has completed ABTx- needs Diflucan     HPI: 68 y.o. 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 On 03/31/14 had to go to an urgent care there in [place] because she had developed four "spots" under her left axilla. She has a patient information sheet that she was given from the urgent care. Diagnosed with folliculitis. Was treated with clindamycin. She took the clindamycin as directed and completed the course. She says that those areas under the left axilla have resolved. However she thinks that now she has developed a yeast infection from the antibiotics. She is having vaginal irritation and some discharge.     Home Meds:   Outpatient Prescriptions Prior to Visit  Medication Sig Dispense Refill  . ALPRAZolam (XANAX) 0.5 MG tablet Take 0.5 mg by mouth 2 (two) times daily as needed for anxiety.      [name] aspirin 81 MG tablet Take 81 mg by mouth daily.        [name] atorvastatin (LIPITOR) 80 MG tablet Take 1 tablet (80 mg total) by mouth daily at 6 PM.  30 tablet  9  . clopidogrel (PLAVIX) 75 MG tablet TAKE 1 TABLET EVERY DAY  30 tablet  6  . ezetimibe (ZETIA) 10 MG tablet Take 1 tablet (10 mg total) by mouth daily.  28 tablet  0  . fexofenadine (ALLEGRA) 180 MG tablet Take 180 mg by mouth daily.      . fish oil-omega-3 fatty acids 1000 MG capsule Take 1 g by mouth. Takes occasionally      . losartan (COZAAR) 50 MG tablet Take 1 tablet by mouth daily.      . metoprolol (LOPRESSOR) 50 MG tablet TAKE 1 TABLET BY MOUTH IN THE MORNING AND 1&1/2 IN THE EVENING  90 tablet  0  . Misc Natural Products (GLUCOSAMINE CHOND COMPLEX/MSM PO) Take by mouth. Occasionally      . montelukast (SINGULAIR) 10 MG tablet Take 1 tablet (10 mg  total) by mouth at bedtime.  30 tablet  3  . Multiple Vitamin (MULTIVITAMIN) tablet Take 1 tablet by mouth. occasionally      . nitroGLYCERIN (NITROSTAT) 0.4 MG SL tablet Place 1 tablet (0.4 mg total) under the tongue every 5 (five) minutes as needed for chest pain.  25 tablet  3  . pantoprazole (PROTONIX) 40 MG tablet TAKE 1 TABLET (40 MG TOTAL) BY MOUTH DAILY.  30 tablet  2  . triamterene-hydrochlorothiazide (MAXZIDE-25) 37.5-25 MG per tablet take 1 tablet by mouth once daily      . Triamcinolone Acetonide (NASACORT AQ NA) Place into the nose.       No facility-administered medications prior to visit.    Allergies:  Allergies  Allergen Reactions  . Ciprofloxacin   . Citalopram Hydrobromide   . Escitalopram Oxalate   . Paroxetine   . Polysporin [Bacitracin-Polymyxin B]       Review of Systems: See HPI for pertinent ROS. All other ROS negative.    Physical Exam: Blood pressure 136/72, pulse 68, temperature 98 F (36.7 C), temperature source Oral, resp. rate 14, height 5\' 6"  (1.676 m), weight 223 lb (101.152 kg)., Body mass index is 36.01 kg/(m^2). General:  Overweight  African American female .Appears in no acute distress. Neck: Supple. No thyromegaly. No lymphadenopathy. Lungs: Clear bilaterally to auscultation without wheezes, rales, or rhonchi. Breathing is unlabored. Heart: Regular rhythm. No murmurs, rubs, or gallops. Msk:  Strength and tone normal for age. Extremities/Skin: Warm and dry Neuro: Alert and oriented X 3. Moves all extremities spontaneously. Gait is normal. CNII-XII grossly in tact. Psych:  Responds to questions appropriately with a normal affect.   Results for orders placed in visit on 04/16/14  WET PREP FOR [place], YEAST, CLUE      Result Value Ref Range   Yeast Wet Prep HPF POC MANY (*) NONE SEEN   Trich, Wet Prep NONE SEEN  NONE SEEN   Clue Cells Wet Prep HPF POC NONE SEEN  NONE SEEN   WBC, Wet Prep HPF POC MANY (*) NONE SEEN     ASSESSMENT AND PLAN:    67 y.o. year old female with  1. Vaginal candidiasis - fluconazole (DIFLUCAN) 150 MG tablet; Take 1 tablet (150 mg total) by mouth once.  Dispense: 1 tablet; Refill: 0  2. Vaginitis and vulvovaginitis - WET PREP FOR [place], YEAST, CLUE  She is to take Diflucan x one. Followup if symptoms do not resolve.  [address] [place], [place], [facility] 04/16/2014 12:56 PM

## 2014-04-17 ENCOUNTER — Encounter: Payer: Self-pay | Admitting: Internal Medicine

## 2014-04-17 DIAGNOSIS — R600 Localized edema: Secondary | ICD-10-CM | POA: Insufficient documentation

## 2014-04-17 NOTE — Progress Notes (Signed)
OFFICE NOTE  Chief Complaint:  lower extremity swelling  Primary Care Physician: Odette Fraction, MD  HPI:  Madeline Wood is a 68 year old female patient formerly followed by Dr. Rollene Fare, who works as a Photographer. She has a history of coronary disease and had a drug-eluting stent placed to the distal RCA in 2006. She had a negative Myoview in 2012. Just has a history of obesity, hypertension and dyslipidemia. In 2012 underwent back surgery by Dr. Lynann Bologna and has had a good recovery from that.  Recently she had some problems with low blood pressure and had a decrease in her losartan dose to 50 mg once daily which has helped. She has however been having some lower extremity swelling.  When she last saw Dr. Rollene Fare in April 2014, he recommended starting her Dyazide to one full tablet daily. She did not start that given her concern about the change in her blood pressure. It is noted however her blood pressure is slightly higher today 150/84. She is asymptomatic, denying any chest pain, shortness of breath, palpitations, presyncope or syncopal symptoms.  Madeline Wood returns today for followup. She feels quite well except for ongoing lower extremity edema. Blood pressure is well controlled.  PMHx:  Past Medical History  Diagnosis Date  . Hypertension   . Gout   . Hyperlipidemia   . 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 Anxiety   . Obesity   . Knee pain   . Positive TB test   . Hiatal hernia   . GERD (gastroesophageal reflux disease)   . CAD (coronary artery disease)     stent of distal RCA in 2006  . Diabetes mellitus without complication   . Venous insufficiency   . History of nuclear stress test 03/2011    negative lexiscan myoview; normal perfusion    Past Surgical History  Procedure Laterality Date  . Abdominal hysterectomy    . Breast lumpectomy    . Back surgery  2012  . Coronary angioplasty with stent placement  06/29/2005    Taxus 2.5x20mm DES to distal RCA (Dr.  Tami Ribas)  . Transthoracic echocardiogram  12/20/2012    EF 25-85%, normal systolic function, mild hypokinesis of inf myocardium; calcified MV annulua, LA & RA mildly dilated    FAMHx:  Family History  Problem Relation Age of Onset  . Diabetes Sister   . Cancer Brother     colon  . Cancer Paternal Uncle     colon  . Ovarian cancer Mother   . Other Father     homicide  . Heart attack Brother     x2  . Hypertension Brother   . Hypertension Sister     SOCHx:   reports that she quit smoking about 12 years ago. She has never used smokeless tobacco. She reports that she does not drink alcohol or use illicit drugs.  ALLERGIES:  Allergies  Allergen Reactions  . Ciprofloxacin   . Citalopram Hydrobromide   . Escitalopram Oxalate   . Paroxetine   . Polysporin [Bacitracin-Polymyxin B]     ROS: A comprehensive review of systems was negative except for: Cardiovascular: positive for lower extremity edema  HOME MEDS: Current Outpatient Prescriptions  Medication Sig Dispense Refill  . ALPRAZolam (XANAX) 0.5 MG tablet Take 0.5 mg by mouth 2 (two) times daily as needed for anxiety.      [name] aspirin 81 MG tablet Take 81 mg by mouth daily.        [name] atorvastatin (LIPITOR)  80 MG tablet Take 1 tablet (80 mg total) by mouth daily at 6 PM.  30 tablet  9  . clopidogrel (PLAVIX) 75 MG tablet TAKE 1 TABLET EVERY DAY  30 tablet  6  . ezetimibe (ZETIA) 10 MG tablet Take 1 tablet (10 mg total) by mouth daily.  28 tablet  0  . fexofenadine (ALLEGRA) 180 MG tablet Take 180 mg by mouth daily.      . fish oil-omega-3 fatty acids 1000 MG capsule Take 1 g by mouth. Takes occasionally      . losartan (COZAAR) 50 MG tablet Take 1 tablet by mouth daily.      . metoprolol (LOPRESSOR) 50 MG tablet TAKE 1 TABLET BY MOUTH IN THE MORNING AND 1&1/2 IN THE EVENING  90 tablet  0  . Misc Natural Products (GLUCOSAMINE CHOND COMPLEX/MSM PO) Take by mouth. Occasionally      . montelukast (SINGULAIR) 10 MG tablet Take 1  tablet (10 mg total) by mouth at bedtime.  30 tablet  3  . Multiple Vitamin (MULTIVITAMIN) tablet Take 1 tablet by mouth. occasionally      . nitroGLYCERIN (NITROSTAT) 0.4 MG SL tablet Place 1 tablet (0.4 mg total) under the tongue every 5 (five) minutes as needed for chest pain.  25 tablet  3  . pantoprazole (PROTONIX) 40 MG tablet TAKE 1 TABLET (40 MG TOTAL) BY MOUTH DAILY.  30 tablet  2  . Triamcinolone Acetonide (NASACORT AQ NA) Place into the nose.      . triamterene-hydrochlorothiazide (MAXZIDE-25) 37.5-25 MG per tablet take 1 tablet by mouth once daily      . fluconazole (DIFLUCAN) 150 MG tablet Take 1 tablet (150 mg total) by mouth once.  1 tablet  0  . [DISCONTINUED] omeprazole (PRILOSEC OTC) 20 MG tablet Take 1 tablet (20 mg total) by mouth daily.  30 tablet  1   No current facility-administered medications for this visit.    LABS/IMAGING: No results found for this or any previous visit (from the past 48 hour(s)). No results found.  VITALS: BP 138/76  Pulse 63  Ht 5\' 6"  (1.676 m)  Wt 223 lb 6.4 oz (101.334 kg)  BMI 36.08 kg/m2  EXAM: General appearance: alert and no distress Neck: no adenopathy, no carotid bruit, no JVD, supple, symmetrical, trachea midline and thyroid not enlarged, symmetric, no tenderness/mass/nodules Lungs: clear to auscultation bilaterally Heart: regular rate and rhythm, S1, S2 normal, no murmur, click, rub or gallop Abdomen: soft, non-tender; bowel sounds normal; no masses,  no organomegaly and obese Extremities: edema 1+ bilaterally and no venous stasis changes or varicose veins Pulses: 2+ and symmetric Skin: Skin color, texture, turgor normal. No rashes or lesions Neurologic: Grossly normal Psych: Pleasant mood, affect  EKG: Normal sinus rhythm at 63  ASSESSMENT: 1. Coronary disease status post PCI to the distal RCA in 2006 (DES) 2. Obesity 3. Hypertension- at goal  4. Dyslipidemia 5. Lower extremity edema  PLAN: 1.   Madeline Wood is  doing fairly well except for some lower extremity edema. There is no chest pain or unstable angina symptoms. Blood pressure is at goal today. She is currently taking her Maxzide on a daily basis as I suggested at her last visit. Overall she is doing well and I recommend followup in 1 year.  Pixie Casino, MD, [facility] Attending Cardiologist [facility]  HILTY,Kenneth C 04/17/2014, 6:02 PM

## 2014-04-22 ENCOUNTER — Telehealth: Payer: Self-pay | Admitting: Family Medicine

## 2014-04-22 DIAGNOSIS — B3731 Acute candidiasis of vulva and vagina: Secondary | ICD-10-CM

## 2014-04-22 DIAGNOSIS — B373 Candidiasis of vulva and vagina: Secondary | ICD-10-CM

## 2014-04-22 MED ORDER — FLUCONAZOLE 150 MG PO TABS: 150.0000 mg | ORAL_TABLET | Freq: Once | ORAL | Status: DC

## 2014-04-22 NOTE — Telephone Encounter (Signed)
Yes. May sedn prescription for Diflucan 150 mg 1 by mouth daily x2 days.  #2 with 0 refills

## 2014-04-22 NOTE — Telephone Encounter (Signed)
Prescription sent to pharmacy.   Call placed to patient and patient made aware per VM. 

## 2014-04-22 NOTE — Telephone Encounter (Signed)
PT states that she took the Diflucan Friday and she is still having symptoms and would like to know if she could have another dose the clear completely? Pt is currently in [place] and would [facility] [address]. Pharmacy has already been changed to that pharmacy in med & order section.

## 2014-05-04 ENCOUNTER — Other Ambulatory Visit: Payer: Self-pay | Admitting: Physician Assistant

## 2014-05-05 NOTE — Telephone Encounter (Signed)
Refill appropriate and filled per protocol. 

## 2014-05-12 ENCOUNTER — Telehealth: Payer: Self-pay | Admitting: Family Medicine

## 2014-05-12 NOTE — Telephone Encounter (Signed)
LM for pt to call back needing to schedule [facility] AND CPE °

## 2014-06-06 ENCOUNTER — Other Ambulatory Visit: Payer: Self-pay | Admitting: *Deleted

## 2014-06-06 MED ORDER — TRIAMTERENE-HCTZ 37.5-25 MG PO TABS: ORAL_TABLET | ORAL | Status: DC

## 2014-06-06 NOTE — Telephone Encounter (Signed)
Received fax requesting refill on Maxzide.   Refill appropriate and filled per protocol. 

## 2014-06-30 ENCOUNTER — Telehealth: Payer: Self-pay | Admitting: Family Medicine

## 2014-06-30 DIAGNOSIS — J3089 Other allergic rhinitis: Secondary | ICD-10-CM

## 2014-06-30 NOTE — Telephone Encounter (Signed)
Patient would like to see an allergist to see what she is allergic to. She states she has had a reaction after being exposed to a cat and she wants to know what all she is allergic to. Please advise.

## 2014-06-30 NOTE — Telephone Encounter (Signed)
Forward to mbd.

## 2014-07-01 MED ORDER — FEXOFENADINE HCL 180 MG PO TABS
180.0000 mg | ORAL_TABLET | Freq: Every day | ORAL | Status: AC
Start: 2014-07-01 — End: ?

## 2014-07-01 MED ORDER — MONTELUKAST SODIUM 10 MG PO TABS: 10.0000 mg | ORAL_TABLET | Freq: Every day | ORAL | Status: DC

## 2014-07-01 MED ORDER — TRIAMCINOLONE ACETONIDE 55 MCG/ACT NA AERO: 2.0000 | INHALATION_SPRAY | Freq: Every day | NASAL | Status: DC

## 2014-07-01 NOTE — Telephone Encounter (Signed)
Pt wants referral.  Had bad reaction to being around cat.  On two allergy pills and nasal spray.  Referral initiated

## 2014-07-01 NOTE — Telephone Encounter (Signed)
Tell her that I would recommend that she simply use medication to control the symptoms as long as the allergic reactions are mild--- i.e. Just itching, mild rash,  or runny nose, or itchy eyes.--If just having these kinds of symptoms then simply take Benadryl or Zyrtec as needed. If she is insistent then she can go to the allergist for allergy testing but make sure she is aware that this is an extensive evaluation.

## 2014-07-02 ENCOUNTER — Ambulatory Visit (INDEPENDENT_AMBULATORY_CARE_PROVIDER_SITE_OTHER): Payer: Medicare Other | Admitting: *Deleted

## 2014-07-02 ENCOUNTER — Telehealth: Payer: Self-pay | Admitting: *Deleted

## 2014-07-02 ENCOUNTER — Ambulatory Visit: Payer: Medicare Other

## 2014-07-02 DIAGNOSIS — Z23 Encounter for immunization: Secondary | ICD-10-CM

## 2014-07-02 MED ORDER — ZOSTER VACCINE LIVE 19400 UNT/0.65ML ~~LOC~~ SOLR: 0.6500 mL | Freq: Once | SUBCUTANEOUS | Status: DC

## 2014-07-02 NOTE — Telephone Encounter (Signed)
Prescription sent to pharmacy.   Call placed to patient and patient made aware per VM. 

## 2014-07-02 NOTE — Progress Notes (Signed)
Patient ID: FLORNCE RECORD, female   DOB: 02-06-1946, 68 y.o.   MRN: [id number] Patient seen in office for Influenza Vaccination.   Tolerated IM administration well.

## 2014-07-02 NOTE — Telephone Encounter (Signed)
Patient in office for flu shot.   Requested order for [facility] [address]/ [address].  Ok to order?

## 2014-07-02 NOTE — Telephone Encounter (Signed)
ok 

## 2014-07-04 ENCOUNTER — Other Ambulatory Visit: Payer: Self-pay | Admitting: Physician Assistant

## 2014-07-15 ENCOUNTER — Telehealth: Payer: Self-pay | Admitting: Internal Medicine

## 2014-07-15 NOTE — Telephone Encounter (Signed)
Patient is having allergy testing - may included scratching and or needles. Needs to know if should hold plavix. Informed her it is unlikely she will need to hold this med, but will seek clarification from MD

## 2014-07-15 NOTE — Telephone Encounter (Signed)
Should not be a problem to continue.  Dr. Lemmie Evens

## 2014-07-15 NOTE — Telephone Encounter (Signed)
Pt says she is going to be tested for allergies next week. She is concerned because she is on Plavix,should she stop that?

## 2014-07-17 NOTE — Telephone Encounter (Signed)
Pt called again,still waiting to hear from you about hollding her medicine.

## 2014-07-17 NOTE — Telephone Encounter (Signed)
patient notified NO need to hold plavix for allergy testing - she voiced understanding

## 2014-07-20 ENCOUNTER — Other Ambulatory Visit: Payer: Self-pay | Admitting: Physician Assistant

## 2014-07-20 NOTE — Telephone Encounter (Signed)
Approved. #30+2. 

## 2014-07-20 NOTE — Telephone Encounter (Signed)
RX called in .

## 2014-07-20 NOTE — Telephone Encounter (Signed)
Ok to refill??  Last office visit 04/16/2014.  Last refill 04/15/2014, #2 refills.

## 2014-07-27 ENCOUNTER — Other Ambulatory Visit: Payer: Self-pay | Admitting: Physician Assistant

## 2014-07-27 NOTE — Telephone Encounter (Signed)
Medication refilled per protocol. 

## 2014-08-03 ENCOUNTER — Other Ambulatory Visit: Payer: Self-pay | Admitting: Internal Medicine

## 2014-08-03 NOTE — Telephone Encounter (Signed)
Rx has been sent to the pharmacy electronically. ° °

## 2014-08-11 ENCOUNTER — Other Ambulatory Visit: Payer: Self-pay | Admitting: Obstetrics and Gynecology

## 2014-08-13 LAB — CYTOLOGY - PAP

## 2014-08-17 ENCOUNTER — Other Ambulatory Visit: Payer: Medicare Other

## 2014-08-17 DIAGNOSIS — E785 Hyperlipidemia, unspecified: Secondary | ICD-10-CM

## 2014-08-17 DIAGNOSIS — I10 Essential (primary) hypertension: Secondary | ICD-10-CM

## 2014-08-17 DIAGNOSIS — E119 Type 2 diabetes mellitus without complications: Secondary | ICD-10-CM

## 2014-08-17 LAB — LIPID PANEL
Cholesterol: 130 mg/dL (ref 0–200)
HDL: 58 mg/dL (ref >39)
LDL Cholesterol: 64 mg/dL (ref 0–99)
Total CHOL/HDL Ratio: 2.2 Ratio
Triglycerides: 42 mg/dL (ref <150)
VLDL: 8 mg/dL (ref 0–40)

## 2014-08-17 LAB — CBC WITH DIFFERENTIAL/PLATELET
Basophils Absolute: 0 10*3/uL (ref 0.0–0.1)
Basophils Relative: 0 % (ref 0–1)
Eosinophils Absolute: 0.1 10*3/uL (ref 0.0–0.7)
Eosinophils Relative: 1 % (ref 0–5)
HCT: 40.1 % (ref 36.0–46.0)
Hemoglobin: 13 g/dL (ref 12.0–15.0)
Lymphocytes Relative: 46 % (ref 12–46)
Lymphs Abs: 2.8 10*3/uL (ref 0.7–4.0)
MCH: 29.6 pg (ref 26.0–34.0)
MCHC: 32.4 g/dL (ref 30.0–36.0)
MCV: 91.3 fL (ref 78.0–100.0)
MPV: 8.3 fL — ABNORMAL LOW (ref 9.4–12.4)
Monocytes Absolute: 0.9 10*3/uL (ref 0.1–1.0)
Monocytes Relative: 14 % — ABNORMAL HIGH (ref 3–12)
Neutro Abs: 2.4 10*3/uL (ref 1.7–7.7)
Neutrophils Relative %: 39 % — ABNORMAL LOW (ref 43–77)
Platelets: 221 10*3/uL (ref 150–400)
RBC: 4.39 MIL/uL (ref 3.87–5.11)
RDW: 13.7 % (ref 11.5–15.5)
WBC: 6.1 10*3/uL (ref 4.0–10.5)

## 2014-08-17 LAB — COMPLETE METABOLIC PANEL WITH GFR
ALT: 10 U/L (ref 0–35)
AST: 20 U/L (ref 0–37)
Albumin: 3.9 g/dL (ref 3.5–5.2)
Alkaline Phosphatase: 41 U/L (ref 39–117)
BUN: 15 mg/dL (ref 6–23)
CO2: 30 mEq/L (ref 19–32)
Calcium: 9.4 mg/dL (ref 8.4–10.5)
Chloride: 97 mEq/L (ref 96–112)
Creat: 0.73 mg/dL (ref 0.50–1.10)
GFR, Est African American: >89 mL/min
GFR, Est Non African American: 85 mL/min
Glucose, Bld: 90 mg/dL (ref 70–99)
Potassium: 4.3 mEq/L (ref 3.5–5.3)
Sodium: 133 mEq/L — ABNORMAL LOW (ref 135–145)
Total Bilirubin: 0.5 mg/dL (ref 0.2–1.2)
Total Protein: 6.8 g/dL (ref 6.0–8.3)

## 2014-08-18 LAB — HEMOGLOBIN A1C
Hgb A1c MFr Bld: 6.3 % — ABNORMAL HIGH (ref <5.7)
Mean Plasma Glucose: 134 mg/dL — ABNORMAL HIGH (ref <117)

## 2014-08-24 ENCOUNTER — Encounter: Payer: Self-pay | Admitting: Physician Assistant

## 2014-08-24 ENCOUNTER — Ambulatory Visit (INDEPENDENT_AMBULATORY_CARE_PROVIDER_SITE_OTHER): Payer: Medicare Other | Admitting: Physician Assistant

## 2014-08-24 VITALS — BP 120/74 | HR 68 | Temp 98.4°F | Resp 18 | Ht 64.3 in | Wt 220.0 lb

## 2014-08-24 DIAGNOSIS — G4700 Insomnia, unspecified: Secondary | ICD-10-CM

## 2014-08-24 DIAGNOSIS — Z0000 Encounter for general adult medical examination without abnormal findings: Secondary | ICD-10-CM

## 2014-08-24 MED ORDER — ZOLPIDEM TARTRATE 10 MG PO TABS: 10.0000 mg | ORAL_TABLET | Freq: Every evening | ORAL | Status: DC | PRN

## 2014-08-24 NOTE — Progress Notes (Signed)
Patient ID: Madeline Wood MRN: [id number], DOB: 12/20/1945, 68 y.o. Date of Encounter: 08/24/2014,   Subjective:   Patient presents for Medicare Annual/Subsequent preventive examination.   Review Past Medical/Family/Social: Each of these is reviewed and updated today.    Risk Factors  Current exercise habits:  She does no exercise. Is active around the house and does errands etc.  Dietary issues discussed:  We have discussed proper diet multiple times and again today. She says she knows what to eat and what not to eat, it's just a matter of doing it!!  Cardiac risk factors: She sees a Film/video editor routinely.   Depression Screen  (Note: if answer to either of the following is "Yes", a more complete depression screening is indicated)  Over the past two weeks, have you felt down, depressed or hopeless? No Over the past two weeks, have you felt little interest or pleasure in doing things? No Have you lost interest or pleasure in daily life? No Do you often feel hopeless? No Do you cry easily over simple problems? No   Activities of Daily Living  In your present state of health, do you have any difficulty performing the following activities?:  Driving? No  Managing money? No  Feeding yourself? No  Getting from bed to chair? No  Climbing a flight of stairs? No  Preparing food and eating?: No  Bathing or showering? No  Getting dressed: No  Getting to the toilet? No  Using the toilet:No  Moving around from place to place: No  In the past year have you fallen or had a near fall?:No  Are you sexually active? No  Do you have more than one partner? No   Hearing Difficulties: No  Do you often ask people to speak up or repeat themselves? No  Do you experience ringing or noises in your ears? No Do you have difficulty understanding soft or whispered voices? No  Do you feel that you have a problem with memory? No Do you often misplace items? No  Do you feel safe at home?  Yes  Cognitive Testing  Alert? Yes Normal Appearance?Yes  Oriented to person? Yes Place? Yes  Time? Yes  Recall of three objects? Yes  Can perform simple calculations? Yes  Displays appropriate judgment?Yes  Can read the correct time from a watch face?Yes   List the Names of Other Physician/Practitioners you currently use: Dr. Budd Palmer Allergy Specialist Cardiologist    Indicate any recent Medical Services you may have received from other than [facility] providers in the past year (date may be approximate).  See Above  Screening Tests / Date--See END OF NOTE Colonoscopy                     Zostavax  Mammogram  Influenza Vaccine  Tetanus/tdap    Assessment:    Annual wellness medicare exam   Plan:    During the course of the visit the patient was educated and counseled about appropriate screening and preventive services including:  Screening mammography  Colorectal cancer screening  Shingles vaccine. Prescription given to that she can get the vaccine at the pharmacy or Medicare part D.  Screen + for depression. PHQ- 9 score of 12 (moderate depression). We discussed the options of counseling versus possibly a medication. I encouraged her strongly think about the counseling. She is going through some medical problems currently and her husband is as well Madeline Wood. been very stressful for her. She says she will think  about it. She does have Xanax to use as needed. Though she may benefit from an SSRI for her more depressive type symptoms but she wants to hold off at this time.  I aksed her to please have her cardioloist send records since we have none on file.  Diet review for nutrition referral? Yes ____ Not Indicated __x__  Patient Instructions (the written plan) was given to the patient.  Medicare Attestation  I have personally reviewed:  The patient's medical and social history  Their use of alcohol, tobacco or illicit drugs  Their current medications and supplements  The  patient's functional ability including ADLs,fall risks, home safety risks, cognitive, and hearing and visual impairment  Diet and physical activities  Evidence for depression or mood disorders  The patient's weight, height, BMI, and visual acuity have been recorded in the chart. I have made referrals, counseling, and provided education to the patient based on review of the above and I have provided the patient with a written personalized care plan for preventive services.       Chief Complaint: Physical (CPE)  HPI: 68 y.o. y/o female  here for CPE.    Review of Systems: Consitutional: No fever, chills, fatigue, night sweats, lymphadenopathy. No significant/unexplained weight changes. Eyes: No visual changes, eye redness, or discharge. ENT/Mouth: No ear pain, sore throat, nasal drainage, or sinus pain. Cardiovascular: No chest pressure,heaviness, tightness or squeezing, even with exertion. No increased shortness of breath or dyspnea on exertion.No palpitations, edema, orthopnea, PND. Respiratory: No cough, hemoptysis, SOB, or wheezing. Gastrointestinal: No anorexia, dysphagia, reflux, pain, nausea, vomiting, hematemesis, diarrhea, constipation, BRBPR, or melena. Breast: No mass, nodules, bulging, or retraction. No skin changes or inflammation. No nipple discharge. No lymphadenopathy. Genitourinary: No dysuria, hematuria, incontinence, vaginal discharge, pruritis, burning, abnormal bleeding, or pain. Musculoskeletal: No decreased ROM, No joint pain or swelling. No significant pain in neck, back, or extremities. Skin: No rash, pruritis, or concerning lesions. Neurological: No headache, dizziness, syncope, seizures, tremors, memory loss, coordination problems, or paresthesias. Psychological: No anxiety, depression, hallucinations, SI/HI. Endocrine: No polydipsia, polyphagia, polyuria, or known diabetes.No increased fatigue. No palpitations/rapid heart rate. No significant/unexplained weight  change. All other systems were reviewed and are otherwise negative.  Past Medical History  Diagnosis Date  . Hypertension   . Gout   . Hyperlipidemia   . 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 Anxiety   . Obesity   . Knee pain   . Positive TB test   . Hiatal hernia   . GERD (gastroesophageal reflux disease)   . CAD (coronary artery disease)     stent of distal RCA in 2006  . Diabetes mellitus without complication   . Venous insufficiency   . History of nuclear stress test 03/2011    negative lexiscan myoview; normal perfusion     Past Surgical History  Procedure Laterality Date  . Abdominal hysterectomy    . Breast lumpectomy    . Back surgery  2012  . Coronary angioplasty with stent placement  06/29/2005    Taxus 2.5x51mm DES to distal RCA (Dr. Tami Ribas)  . Transthoracic echocardiogram  12/20/2012    EF 16-96%, normal systolic function, mild hypokinesis of inf myocardium; calcified MV annulua, LA & RA mildly dilated    Home Meds:  Outpatient Prescriptions Prior to Visit  Medication Sig Dispense Refill  . ALPRAZolam (XANAX) 1 MG tablet TAKE 1 TABLET TWICE A DAY AS NEEDED FOR ANXIETY/AGGITATION...MUST LAST 30 DAYS APART... 30 tablet 2  . aspirin 81  MG tablet Take 81 mg by mouth daily.      [name] atorvastatin (LIPITOR) 80 MG tablet TAKE 1 TABLET BY MOUTH EVERY DAY AT 6 PM 30 tablet 6  . clopidogrel (PLAVIX) 75 MG tablet TAKE 1 TABLET EVERY DAY 30 tablet 6  . ezetimibe (ZETIA) 10 MG tablet Take 1 tablet (10 mg total) by mouth daily. 28 tablet 0  . fexofenadine (ALLEGRA) 180 MG tablet Take 1 tablet (180 mg total) by mouth daily. 30 tablet 5  . fish oil-omega-3 fatty acids 1000 MG capsule Take 1 g by mouth. Takes occasionally    . losartan (COZAAR) 50 MG tablet TAKE ONE TABLET DAILY 30 tablet 6  . metoprolol (LOPRESSOR) 50 MG tablet TAKE 1 TABLET BY MOUTH IN THE MORNING AND 1&1/2 IN THE EVENING 90 tablet 0  . Misc Natural Products (GLUCOSAMINE CHOND COMPLEX/MSM PO) Take by mouth. Occasionally     . montelukast (SINGULAIR) 10 MG tablet Take 1 tablet (10 mg total) by mouth at bedtime. 30 tablet 5  . Multiple Vitamin (MULTIVITAMIN) tablet Take 1 tablet by mouth. occasionally    . nitroGLYCERIN (NITROSTAT) 0.4 MG SL tablet Place 1 tablet (0.4 mg total) under the tongue every 5 (five) minutes as needed for chest pain. 25 tablet 3  . pantoprazole (PROTONIX) 40 MG tablet TAKE 1 TABLET EVERY DAY 30 tablet 2  . triamcinolone (NASACORT AQ) 55 MCG/ACT AERO nasal inhaler Place 2 sprays into the nose daily. 16.5 Inhaler 5  . triamterene-hydrochlorothiazide (MAXZIDE-25) 37.5-25 MG per tablet take 1 tablet by mouth once daily 30 tablet 6  . fluconazole (DIFLUCAN) 150 MG tablet Take 1 tablet (150 mg total) by mouth once. (Patient not taking: Reported on 08/24/2014) 1 tablet 0  . zoster vaccine live, PF, (ZOSTAVAX) 53614 UNT/0.65ML injection Inject 19,400 Units into the skin once. 1 each 0  . ZETIA 10 MG tablet TAKE 1 TABLET (10 MG TOTAL) BY MOUTH DAILY. 30 tablet 6   No facility-administered medications prior to visit.    Allergies:  Allergies  Allergen Reactions  . Ciprofloxacin   . Citalopram Hydrobromide   . Escitalopram Oxalate   . Paroxetine   . Polysporin [Bacitracin-Polymyxin B]     History   Social History  . Marital Status: Single    Spouse Name: N/A    Number of Children: 1  . 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 Smoking status: Former Smoker    Quit date: 12/20/2001  . Smokeless tobacco: Never Used  . Alcohol Use: No  . Drug Use: No  . Sexual Activity: Not on file   Other Topics Concern  . Not on file   Social History Narrative    Family History  Problem Relation Age of Onset  . Diabetes Sister   . Cancer Brother     colon  . Cancer Paternal Uncle     colon  . Ovarian cancer Mother   . Other Father     homicide  . Heart attack Brother     x2  . Hypertension Brother   . Hypertension Sister      Physical Exam: Blood pressure 120/74, pulse 68, temperature 98.4 F (36.9 C), temperature source Oral, resp. rate 18, height 5' 4.3" (1.633 m), weight 220 lb (99.791 kg)., Body mass index is 37.42 kg/(m^2). General: Obese AAF. Appears in no acute distress. HEENT: Normocephalic, atraumatic. Conjunctiva pink, sclera non-icteric. Pupils 2 mm constricting  to 1 mm, round, regular, and equally reactive to light and accomodation. EOMI. Internal auditory canal clear. TMs with good cone of light and without pathology. Nasal mucosa pink. Nares are without discharge. No sinus tenderness. Oral mucosa pink.  Pharynx without exudate.   Neck: Supple. Trachea midline. No thyromegaly. Full ROM. No lymphadenopathy.No Carotid Bruits. Lungs: Clear to auscultation bilaterally without wheezes, rales, or rhonchi. Breathing is of normal effort and unlabored. Cardiovascular: RRR with S1 S2. No murmurs, rubs, or gallops. Distal pulses 2+ symmetrically. No carotid or abdominal bruits. Breast: Deferred. Jus tahd this with Gyn 08/11/2014--Dr. Lowe--Per pt Abdomen: Soft, non-tender, non-distended with normoactive bowel sounds. No hepatosplenomegaly or masses. No rebound/guarding. No CVA tenderness. No hernias.  Genitourinary: Deferred. Just had with Gyn--Dr. Lowe--08/11/14--per pt Musculoskeletal: Full range of motion and 5/5 strength throughout. Without swelling, atrophy, tenderness, crepitus, or warmth. Extremities without clubbing, cyanosis, or edema.  Skin: Warm and moist without erythema, ecchymosis, wounds, or rash. Neuro: A+Ox3. CN II-XII grossly intact. Moves all extremities spontaneously. Full sensation throughout. Normal gait.  Psych:  Responds to questions appropriately with a normal affect.   Assessment/Plan:  68 y.o. y/o female here for CPE 3. Insomnia Today she reports that she is able to fall asleep, but even with the Xanax, has difficulty staying asleep. 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 Say she is on regular sleep/wake cycle but still having insomnia. Active during day.   Informed not to take xanax and ambien together.  - zolpidem (AMBIEN) 10 MG tablet; Take 1 tablet (10 mg total) by mouth at bedtime as needed for sleep.  Dispense: 15 tablet; Refill: 1   1. Medicare annual wellness visit, subsequent  2. Visit for preventive health examination  A. Screening Labs: She came and had fasting labs on 08/17/2014. All were stable/good.    B. Pap: No Pap indicated. History of complete hysterectomy. No cancer.   Also, Says she just saw Dr. Corinna Capra, Gyn 08/11/2014  C. Screening Mammogram:  10/08/2013---[facility] ---Negative  D. DEXA/BMD: Reviewed her chart and do not see that she has had one of these in the past.  She has no risk factors for osteoporosis other than having a hysterectomy at an early age. Says her ovaries were removed at that time-- Pt agreeable for me to schedule DEXA - DG Bone Density; Future    E. Colorectal Cancer Screening: Last colonoscopy was October 2011. I do not have the report. However patient states that she is to repeat 5 years which means this is due October 2016.  F. Glaucoma Screen:  She says her last eye exam was about 2 years ago---showed no glaucoma, no diabetic retinopathy. Pt agreeable to schedule f/u Eye Exam.  G. Immunizations:  Influenza:  Received 07/02/2014 Tetanus:  Tdap Given Here 01/25/2012 Pneumococcal: Prevnar 13---Given here 08/18/2013                          Pneumovax 23---Given here 02/25/2014 Zostavax:   Pt says she did find out that Zostavax is covered with her Insurance. Even ad [place] send Rx to Pharmacy. However, has not gone to pharmacy to get shot yet--told her to go today  Regular office visit 6 months or sooner if needed   Signed, Olean Ree [place], [place], [place] [facility] 08/24/2014 9:32 AM

## 2014-08-31 ENCOUNTER — Ambulatory Visit: Payer: Medicare Other | Admitting: Physician Assistant

## 2014-09-01 ENCOUNTER — Telehealth: Payer: Self-pay | Admitting: Internal Medicine

## 2014-09-01 DIAGNOSIS — J309 Allergic rhinitis, unspecified: Secondary | ICD-10-CM | POA: Diagnosis not present

## 2014-09-01 NOTE — Telephone Encounter (Signed)
Pt called in stating that she will start taking a allergy injection medication and it seems that there will be a drug interaction between that and the Metoprolol. She would like to know if the Metoprolol can be changed . Please call  Thanks

## 2014-09-01 NOTE — Telephone Encounter (Signed)
LMTCB

## 2014-09-02 ENCOUNTER — Telehealth: Payer: Self-pay | Admitting: Internal Medicine

## 2014-09-02 NOTE — Telephone Encounter (Signed)
Pt. Instructed to have the doctor call here and ask for our pharmacist Kelle Darting

## 2014-09-02 NOTE — Telephone Encounter (Signed)
Returning call from yesterday. °

## 2014-09-03 ENCOUNTER — Encounter: Payer: Self-pay | Admitting: *Deleted

## 2014-09-04 ENCOUNTER — Telehealth: Payer: Self-pay | Admitting: Cardiovascular Disease

## 2014-09-04 NOTE — Telephone Encounter (Signed)
Spoke w/ patient, referred to previous encounter notes. She has been instructed to get in touch with the physician's office performing the shots and have that physician coordinate w/ Erasmo Downer on considerations for potential medication interactions.  Patient voiced clear understanding of instruction given. She will f/u w/ the other office.

## 2014-09-04 NOTE — Telephone Encounter (Signed)
Madeline Wood is calling about changing her Metoprolol , because it interferes with allergy shots she takes . Please Call    Thanks

## 2014-09-15 ENCOUNTER — Other Ambulatory Visit: Payer: Self-pay | Admitting: Physician Assistant

## 2014-09-15 NOTE — Telephone Encounter (Signed)
Medication refilled per protocol. 

## 2014-09-24 DIAGNOSIS — J309 Allergic rhinitis, unspecified: Secondary | ICD-10-CM | POA: Diagnosis not present

## 2014-09-28 DIAGNOSIS — J309 Allergic rhinitis, unspecified: Secondary | ICD-10-CM | POA: Diagnosis not present

## 2014-10-01 DIAGNOSIS — J309 Allergic rhinitis, unspecified: Secondary | ICD-10-CM | POA: Diagnosis not present

## 2014-10-06 DIAGNOSIS — J309 Allergic rhinitis, unspecified: Secondary | ICD-10-CM | POA: Diagnosis not present

## 2014-10-08 ENCOUNTER — Other Ambulatory Visit: Payer: Self-pay | Admitting: Physician Assistant

## 2014-10-08 NOTE — Telephone Encounter (Signed)
Refill appropriate and filled per protocol. 

## 2014-10-19 DIAGNOSIS — J309 Allergic rhinitis, unspecified: Secondary | ICD-10-CM | POA: Diagnosis not present

## 2014-10-21 ENCOUNTER — Other Ambulatory Visit: Payer: Self-pay | Admitting: Physician Assistant

## 2014-10-21 DIAGNOSIS — J309 Allergic rhinitis, unspecified: Secondary | ICD-10-CM | POA: Diagnosis not present

## 2014-10-21 NOTE — Telephone Encounter (Signed)
Approved with 2 additional refills.

## 2014-10-21 NOTE — Telephone Encounter (Signed)
Ok to refill??  Last office visit 08/24/2014.  Last refill 07/20/2014, #2 refills.

## 2014-10-22 NOTE — Telephone Encounter (Signed)
Medication called to pharmacy. 

## 2014-11-02 ENCOUNTER — Other Ambulatory Visit: Payer: Self-pay | Admitting: Internal Medicine

## 2014-11-02 DIAGNOSIS — J309 Allergic rhinitis, unspecified: Secondary | ICD-10-CM | POA: Diagnosis not present

## 2014-11-03 NOTE — Telephone Encounter (Signed)
Rx(s) sent to pharmacy electronically.  

## 2014-11-05 DIAGNOSIS — J309 Allergic rhinitis, unspecified: Secondary | ICD-10-CM | POA: Diagnosis not present

## 2014-11-10 DIAGNOSIS — J309 Allergic rhinitis, unspecified: Secondary | ICD-10-CM | POA: Diagnosis not present

## 2014-11-12 DIAGNOSIS — J309 Allergic rhinitis, unspecified: Secondary | ICD-10-CM | POA: Diagnosis not present

## 2014-11-13 DIAGNOSIS — Z1231 Encounter for screening mammogram for malignant neoplasm of breast: Secondary | ICD-10-CM | POA: Diagnosis not present

## 2014-11-13 LAB — HM MAMMOGRAPHY: HM Mammogram: NORMAL

## 2014-11-17 DIAGNOSIS — J309 Allergic rhinitis, unspecified: Secondary | ICD-10-CM | POA: Diagnosis not present

## 2014-11-19 ENCOUNTER — Other Ambulatory Visit: Payer: Self-pay | Admitting: *Deleted

## 2014-11-19 MED ORDER — TRIAMTERENE-HCTZ 37.5-25 MG PO TABS: ORAL_TABLET | ORAL | Status: DC

## 2014-11-23 DIAGNOSIS — J309 Allergic rhinitis, unspecified: Secondary | ICD-10-CM | POA: Diagnosis not present

## 2014-11-25 ENCOUNTER — Encounter: Payer: Self-pay | Admitting: Family Medicine

## 2014-11-25 DIAGNOSIS — J309 Allergic rhinitis, unspecified: Secondary | ICD-10-CM | POA: Diagnosis not present

## 2014-12-02 DIAGNOSIS — J309 Allergic rhinitis, unspecified: Secondary | ICD-10-CM | POA: Diagnosis not present

## 2014-12-04 DIAGNOSIS — J309 Allergic rhinitis, unspecified: Secondary | ICD-10-CM | POA: Diagnosis not present

## 2014-12-09 DIAGNOSIS — J309 Allergic rhinitis, unspecified: Secondary | ICD-10-CM | POA: Diagnosis not present

## 2014-12-11 DIAGNOSIS — J309 Allergic rhinitis, unspecified: Secondary | ICD-10-CM | POA: Diagnosis not present

## 2014-12-14 DIAGNOSIS — J309 Allergic rhinitis, unspecified: Secondary | ICD-10-CM | POA: Diagnosis not present

## 2014-12-16 ENCOUNTER — Telehealth: Payer: Self-pay | Admitting: Physician Assistant

## 2014-12-16 NOTE — Telephone Encounter (Signed)
Patient is calling to see if dr Buelah Manis call in niacin for her if possible  Please call with any questions to [phone] [facility] [place]

## 2014-12-17 DIAGNOSIS — J309 Allergic rhinitis, unspecified: Secondary | ICD-10-CM | POA: Diagnosis not present

## 2014-12-18 MED ORDER — NYSTATIN 100000 UNIT/ML MT SUSP: 5.0000 mL | Freq: Four times a day (QID) | OROMUCOSAL | Status: DC

## 2014-12-18 NOTE — Telephone Encounter (Signed)
RX to pharmacy and pt aware 

## 2014-12-18 NOTE — Telephone Encounter (Signed)
Ok 1 tsp qid but ntbs if no better next week.

## 2014-12-18 NOTE — Telephone Encounter (Signed)
Pt wants Nystatin for her mouth.  Sores in mouth, has used this before and it helped.

## 2014-12-22 DIAGNOSIS — J309 Allergic rhinitis, unspecified: Secondary | ICD-10-CM | POA: Diagnosis not present

## 2014-12-23 ENCOUNTER — Encounter: Payer: Self-pay | Admitting: Gastroenterology

## 2014-12-24 ENCOUNTER — Other Ambulatory Visit: Payer: Self-pay | Admitting: *Deleted

## 2014-12-24 ENCOUNTER — Telehealth: Payer: Self-pay | Admitting: Family Medicine

## 2014-12-24 DIAGNOSIS — J3089 Other allergic rhinitis: Secondary | ICD-10-CM

## 2014-12-24 MED ORDER — LOSARTAN POTASSIUM 50 MG PO TABS: 50.0000 mg | ORAL_TABLET | Freq: Every day | ORAL | Status: DC

## 2014-12-24 MED ORDER — PANTOPRAZOLE SODIUM 40 MG PO TBEC: 40.0000 mg | DELAYED_RELEASE_TABLET | Freq: Every day | ORAL | Status: DC

## 2014-12-24 MED ORDER — CLOPIDOGREL BISULFATE 75 MG PO TABS: 75.0000 mg | ORAL_TABLET | Freq: Every day | ORAL | Status: DC

## 2014-12-24 MED ORDER — MONTELUKAST SODIUM 10 MG PO TABS: 10.0000 mg | ORAL_TABLET | Freq: Every day | ORAL | Status: DC

## 2014-12-24 NOTE — Telephone Encounter (Signed)
Rx refill sent to patient pharmacy   

## 2014-12-24 NOTE — Telephone Encounter (Signed)
Medication refilled per protocol. 

## 2014-12-28 ENCOUNTER — Telehealth: Payer: Self-pay | Admitting: Internal Medicine

## 2014-12-28 ENCOUNTER — Other Ambulatory Visit: Payer: Self-pay | Admitting: Family Medicine

## 2014-12-28 LAB — HM DIABETES EYE EXAM

## 2014-12-28 MED ORDER — TRIAMTERENE-HCTZ 37.5-25 MG PO TABS: ORAL_TABLET | ORAL | Status: DC

## 2014-12-28 MED ORDER — CLOPIDOGREL BISULFATE 75 MG PO TABS: 75.0000 mg | ORAL_TABLET | Freq: Every day | ORAL | Status: DC

## 2014-12-28 MED ORDER — LOSARTAN POTASSIUM 50 MG PO TABS: 50.0000 mg | ORAL_TABLET | Freq: Every day | ORAL | Status: DC

## 2014-12-28 NOTE — Telephone Encounter (Signed)
She would like her Plavix and Losartan to be 90 days supply instead of 30 days please.

## 2014-12-28 NOTE — Telephone Encounter (Signed)
Refill submitted to patient's preferred pharmacy.  

## 2014-12-31 DIAGNOSIS — J309 Allergic rhinitis, unspecified: Secondary | ICD-10-CM | POA: Diagnosis not present

## 2015-01-02 ENCOUNTER — Other Ambulatory Visit: Payer: Self-pay | Admitting: Physician Assistant

## 2015-01-04 NOTE — Telephone Encounter (Signed)
Refill appropriate and filled per protocol. 

## 2015-01-05 ENCOUNTER — Telehealth: Payer: Self-pay | Admitting: Family Medicine

## 2015-01-05 DIAGNOSIS — J309 Allergic rhinitis, unspecified: Secondary | ICD-10-CM | POA: Diagnosis not present

## 2015-01-05 DIAGNOSIS — Z780 Asymptomatic menopausal state: Secondary | ICD-10-CM

## 2015-01-05 NOTE — Telephone Encounter (Signed)
Never had Bone Density that was ordered in December.  Needs new order placed to [facility].  New order placed

## 2015-01-08 DIAGNOSIS — J309 Allergic rhinitis, unspecified: Secondary | ICD-10-CM | POA: Diagnosis not present

## 2015-01-11 DIAGNOSIS — H2513 Age-related nuclear cataract, bilateral: Secondary | ICD-10-CM | POA: Diagnosis not present

## 2015-01-13 DIAGNOSIS — J309 Allergic rhinitis, unspecified: Secondary | ICD-10-CM | POA: Diagnosis not present

## 2015-01-18 DIAGNOSIS — J309 Allergic rhinitis, unspecified: Secondary | ICD-10-CM | POA: Diagnosis not present

## 2015-01-19 ENCOUNTER — Encounter: Payer: Self-pay | Admitting: *Deleted

## 2015-01-21 DIAGNOSIS — J309 Allergic rhinitis, unspecified: Secondary | ICD-10-CM | POA: Diagnosis not present

## 2015-01-27 DIAGNOSIS — J309 Allergic rhinitis, unspecified: Secondary | ICD-10-CM | POA: Diagnosis not present

## 2015-01-29 DIAGNOSIS — J309 Allergic rhinitis, unspecified: Secondary | ICD-10-CM | POA: Diagnosis not present

## 2015-02-01 DIAGNOSIS — J309 Allergic rhinitis, unspecified: Secondary | ICD-10-CM | POA: Diagnosis not present

## 2015-02-03 ENCOUNTER — Other Ambulatory Visit: Payer: Self-pay | Admitting: Physician Assistant

## 2015-02-03 NOTE — Telephone Encounter (Signed)
Ok to refill??  Last office visit 08/24/2014.  Last refill 10/19/2014, #2 refills.

## 2015-02-03 NOTE — Telephone Encounter (Signed)
Approved. #30+2. 

## 2015-02-03 NOTE — Telephone Encounter (Signed)
Script called in to pharmacy  

## 2015-02-04 DIAGNOSIS — Z780 Asymptomatic menopausal state: Secondary | ICD-10-CM | POA: Diagnosis not present

## 2015-02-04 DIAGNOSIS — J309 Allergic rhinitis, unspecified: Secondary | ICD-10-CM | POA: Diagnosis not present

## 2015-02-08 DIAGNOSIS — J309 Allergic rhinitis, unspecified: Secondary | ICD-10-CM | POA: Diagnosis not present

## 2015-02-10 DIAGNOSIS — J309 Allergic rhinitis, unspecified: Secondary | ICD-10-CM | POA: Diagnosis not present

## 2015-02-15 DIAGNOSIS — J309 Allergic rhinitis, unspecified: Secondary | ICD-10-CM | POA: Diagnosis not present

## 2015-02-17 ENCOUNTER — Encounter: Payer: Self-pay | Admitting: Physician Assistant

## 2015-02-17 ENCOUNTER — Ambulatory Visit (INDEPENDENT_AMBULATORY_CARE_PROVIDER_SITE_OTHER): Payer: Medicaid Other | Admitting: Physician Assistant

## 2015-02-17 VITALS — BP 130/78 | HR 80 | Temp 98.4°F | Resp 18 | Wt 229.5 lb

## 2015-02-17 DIAGNOSIS — K449 Diaphragmatic hernia without obstruction or gangrene: Secondary | ICD-10-CM

## 2015-02-17 DIAGNOSIS — E669 Obesity, unspecified: Secondary | ICD-10-CM

## 2015-02-17 DIAGNOSIS — F419 Anxiety disorder, unspecified: Secondary | ICD-10-CM

## 2015-02-17 DIAGNOSIS — R600 Localized edema: Secondary | ICD-10-CM | POA: Diagnosis not present

## 2015-02-17 DIAGNOSIS — Z860100 Personal history of colon polyps, unspecified: Secondary | ICD-10-CM

## 2015-02-17 DIAGNOSIS — E119 Type 2 diabetes mellitus without complications: Secondary | ICD-10-CM | POA: Diagnosis not present

## 2015-02-17 DIAGNOSIS — G4700 Insomnia, unspecified: Secondary | ICD-10-CM

## 2015-02-17 DIAGNOSIS — I2510 Atherosclerotic heart disease of native coronary artery without angina pectoris: Secondary | ICD-10-CM | POA: Diagnosis not present

## 2015-02-17 DIAGNOSIS — E785 Hyperlipidemia, unspecified: Secondary | ICD-10-CM

## 2015-02-17 DIAGNOSIS — I10 Essential (primary) hypertension: Secondary | ICD-10-CM | POA: Diagnosis not present

## 2015-02-17 DIAGNOSIS — K219 Gastro-esophageal reflux disease without esophagitis: Secondary | ICD-10-CM

## 2015-02-17 DIAGNOSIS — M109 Gout, unspecified: Secondary | ICD-10-CM | POA: Diagnosis not present

## 2015-02-17 DIAGNOSIS — J309 Allergic rhinitis, unspecified: Secondary | ICD-10-CM | POA: Diagnosis not present

## 2015-02-17 DIAGNOSIS — Z881 Allergy status to other antibiotic agents status: Secondary | ICD-10-CM

## 2015-02-17 LAB — LIPID PANEL
Cholesterol: 139 mg/dL (ref 0–200)
HDL: 73 mg/dL (ref >=46)
LDL Cholesterol: 57 mg/dL (ref 0–99)
Total CHOL/HDL Ratio: 1.9 Ratio
Triglycerides: 44 mg/dL (ref <150)
VLDL: 9 mg/dL (ref 0–40)

## 2015-02-17 LAB — COMPLETE METABOLIC PANEL WITH GFR
ALT: 15 U/L (ref 0–35)
AST: 27 U/L (ref 0–37)
Albumin: 3.9 g/dL (ref 3.5–5.2)
Alkaline Phosphatase: 39 U/L (ref 39–117)
BUN: 16 mg/dL (ref 6–23)
CO2: 30 mEq/L (ref 19–32)
Calcium: 9.7 mg/dL (ref 8.4–10.5)
Chloride: 97 mEq/L (ref 96–112)
Creat: 0.75 mg/dL (ref 0.50–1.10)
GFR, Est African American: >89 mL/min
GFR, Est Non African American: 82 mL/min
Glucose, Bld: 104 mg/dL — ABNORMAL HIGH (ref 70–99)
Potassium: 4 mEq/L (ref 3.5–5.3)
Sodium: 132 mEq/L — ABNORMAL LOW (ref 135–145)
Total Bilirubin: 0.6 mg/dL (ref 0.2–1.2)
Total Protein: 6.6 g/dL (ref 6.0–8.3)

## 2015-02-17 MED ORDER — NYSTATIN 100000 UNIT/ML MT SUSP: 5.0000 mL | Freq: Four times a day (QID) | OROMUCOSAL | Status: DC

## 2015-02-17 NOTE — Progress Notes (Signed)
Patient ID: Madeline Wood MRN: [id number], DOB: 10/15/1945, 69 y.o. Date of Encounter: 02/17/2015,      List the Names of Other Physician/Practitioners you currently use: Dr. Budd Palmer Allergy Specialist Cardiologist    Chief Complaint: Routine 6 month f/u OV  HPI: 69 y.o. y/o female  here for routine 6 month f/u OV.  She is taking all medications as directed and is having no adverse effects.  He has no complaints or concerns today.  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 She says that when she is not busy working she is kept busy by family members. Is that she has one brother who is very ill that she is constantly helping to care for and get to his appointments etc.   Review of Systems: Consitutional: No fever, chills, fatigue, night sweats, lymphadenopathy. No significant/unexplained weight changes. Eyes: No visual changes, eye redness, or discharge. ENT/Mouth: No ear pain, sore throat, nasal drainage, or sinus pain. Cardiovascular: No chest pressure,heaviness, tightness or squeezing, even with exertion. No increased shortness of breath or dyspnea on exertion.No palpitations, edema, orthopnea, PND. Respiratory: No cough, hemoptysis, SOB, or wheezing. Gastrointestinal: No anorexia, dysphagia, reflux, pain, nausea, vomiting, hematemesis, diarrhea, constipation, BRBPR, or melena. Breast: No mass, nodules, bulging, or retraction. No skin changes or inflammation. No nipple discharge. No lymphadenopathy. Genitourinary: No dysuria, hematuria, incontinence, vaginal discharge, pruritis, burning, abnormal bleeding, or pain. Musculoskeletal: No decreased ROM, No joint pain or swelling. No significant pain in neck, back, or extremities. Skin: No rash, pruritis, or concerning lesions. Neurological: No headache, dizziness, syncope, seizures, tremors, memory loss, coordination problems, or paresthesias. Psychological: No anxiety, depression, hallucinations,  SI/HI. Endocrine: No polydipsia, polyphagia, polyuria, or known diabetes.No increased fatigue. No palpitations/rapid heart rate. No significant/unexplained weight change. All other systems were reviewed and are otherwise negative.  Past Medical History  Diagnosis Date  . Hypertension   . Gout   . Hyperlipidemia   . 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 Anxiety   . Obesity   . Knee pain   . Positive TB test   . Hiatal hernia   . GERD (gastroesophageal reflux disease)   . CAD (coronary artery disease)     stent of distal RCA in 2006  . Diabetes mellitus without complication   . Venous insufficiency   . History of nuclear stress test 03/2011    negative lexiscan myoview; normal perfusion     Past Surgical History  Procedure Laterality Date  . Abdominal hysterectomy    . Breast lumpectomy    . Back surgery  2012  . Coronary angioplasty with stent placement  06/29/2005    Taxus 2.5x51mm DES to distal RCA (Dr. Tami Ribas)  . Transthoracic echocardiogram  12/20/2012    EF 01-60%, normal systolic function, mild hypokinesis of inf myocardium; calcified MV annulua, LA & RA mildly dilated    Home Meds:  Outpatient Prescriptions Prior to Visit  Medication Sig Dispense Refill  . ALPRAZolam (XANAX) 1 MG tablet TAKE 1 TABLET TWICE A DAY AS NEEDED 30 tablet 2  . aspirin 81 MG tablet Take 81 mg by mouth daily.      [name] atorvastatin (LIPITOR) 80 MG tablet TAKE 1 TABLET BY MOUTH EVERY DAY AT 6 PM 30 tablet 5  . Azelastine HCl 0.15 % SOLN   5  . clopidogrel (PLAVIX) 75 MG tablet Take 1 tablet (75 mg total) by mouth daily. 90 tablet 1  . ezetimibe (ZETIA) 10 MG tablet Take 1 tablet (10 mg  total) by mouth daily. 28 tablet 0  . fexofenadine (ALLEGRA) 180 MG tablet Take 1 tablet (180 mg total) by mouth daily. 30 tablet 5  . fish oil-omega-3 fatty acids 1000 MG capsule Take 1 g by mouth. Takes occasionally    . fluticasone (FLONASE) 50 MCG/ACT nasal spray   5  . losartan (COZAAR) 50 MG tablet Take 1 tablet (50 mg  total) by mouth daily. 90 tablet 1  . metoprolol (LOPRESSOR) 50 MG tablet TAKE 1 TABLET BY MOUTH IN THE MORNING AND 1&1/2 IN THE EVENING 90 tablet 5  . Misc Natural Products (GLUCOSAMINE CHOND COMPLEX/MSM PO) Take by mouth. Occasionally    . montelukast (SINGULAIR) 10 MG tablet TAKE 1 TABLET (10 MG TOTAL) BY MOUTH AT BEDTIME. 30 tablet 3  . Multiple Vitamin (MULTIVITAMIN) tablet Take 1 tablet by mouth. occasionally    . nitroGLYCERIN (NITROSTAT) 0.4 MG SL tablet Place 1 tablet (0.4 mg total) under the tongue every 5 (five) minutes as needed for chest pain. 25 tablet 3  . nystatin (MYCOSTATIN) 100000 UNIT/ML suspension Take 5 mLs (500,000 Units total) by mouth 4 (four) times daily. 60 mL 0  . pantoprazole (PROTONIX) 40 MG tablet TAKE 1 TABLET EVERY DAY 30 tablet 2  . triamterene-hydrochlorothiazide (MAXZIDE-25) 37.5-25 MG per tablet take 1 tablet by mouth once daily 90 tablet 3  . zoster vaccine live, PF, (ZOSTAVAX) 78938 UNT/0.65ML injection Inject 19,400 Units into the skin once. 1 each 0  . zolpidem (AMBIEN) 10 MG tablet Take 1 tablet (10 mg total) by mouth at bedtime as needed for sleep. 15 tablet 1  . triamcinolone (NASACORT AQ) 55 MCG/ACT AERO nasal inhaler Place 2 sprays into the nose daily. (Patient not taking: Reported on 02/17/2015) 16.5 Inhaler 5   No facility-administered medications prior to visit.    Allergies:  Allergies  Allergen Reactions  . Ciprofloxacin   . Citalopram Hydrobromide   . Escitalopram Oxalate   . Paroxetine   . Polysporin [Bacitracin-Polymyxin B]     History   Social History  . Marital Status: Single    Spouse Name: N/A  . Number of Children: 1  . 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 Smoking status: Former Smoker    Quit date: 12/20/2001  . Smokeless tobacco: Never Used  . Alcohol Use: No  . Drug Use: No  . Sexual Activity: Not on file   Other Topics Concern  . Not on file   Social  History Narrative    Family History  Problem Relation Age of Onset  . Diabetes Sister   . Cancer Brother     colon  . Cancer Paternal Uncle     colon  . Ovarian cancer Mother   . Other Father     homicide  . Heart attack Brother     x2  . Hypertension Brother   . Hypertension Sister     Physical Exam: Blood pressure 130/78, pulse 80, temperature 98.4 F (36.9 C), temperature source Oral, resp. rate 18, weight 229 lb 8 oz (104.101 kg)., Body mass index is 39.04 kg/(m^2). General: Obese AAF. Appears in no acute distress. HEENT: Normocephalic, atraumatic. Conjunctiva pink, sclera non-icteric. Pupils 2 mm constricting to 1 mm, round, regular, and equally reactive to light and accomodation. EOMI. Internal auditory canal clear. TMs with good cone of light and without pathology. Nasal mucosa pink. Nares are without discharge. No sinus tenderness. Oral mucosa  pink.  Pharynx without exudate.   Neck: Supple. Trachea midline. No thyromegaly. Full ROM. No lymphadenopathy.No Carotid Bruits. Lungs: Clear to auscultation bilaterally without wheezes, rales, or rhonchi. Breathing is of normal effort and unlabored. Cardiovascular: RRR with S1 S2. No murmurs, rubs, or gallops. Distal pulses 2+ symmetrically. No carotid or abdominal bruits. Breast: Deferred. Just had this with Gyn 08/11/2014--Dr. Lowe--Per pt Abdomen: Soft, non-tender, non-distended with normoactive bowel sounds. No hepatosplenomegaly or masses. No rebound/guarding. No CVA tenderness. No hernias.  Genitourinary: Deferred. Just had with Gyn--Dr. Lowe--08/11/14--per pt Musculoskeletal: Full range of motion and 5/5 strength throughout. Without swelling, atrophy, tenderness, crepitus, or warmth. Extremities without clubbing, cyanosis, or edema.  Skin: Warm and moist without erythema, ecchymosis, wounds, or rash. Neuro: A+Ox3. CN II-XII grossly intact. Moves all extremities spontaneously. Full sensation throughout. Normal gait.  Psych:   Responds to questions appropriately with a normal affect. Diabetic foot exam: Inspection is normal. 2+ dorsalis pedis and posterior tibial pulses bilaterally. Sensation is intact    Assessment/Plan:  69 y.o. y/o female here for :    Insomnia At OV 07/2014  reported that she was able to fall asleep, but even with the Xanax, has difficulty staying asleep. 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 Said she was on regular sleep/wake cycle but still having insomnia. Active during day.   Informed not to take xanax and ambien together.  At that visit prescribed Ambien 10 mg #15+1 refill. At [place] 01/2015--she states that the Ambien does work well for her when she needs it. Causes no adverse effects. However see you says that she does not use it unless she really needs it. Says that she still has a few pills left from the prescription given at visit 07/2014   1. Atherosclerosis of native coronary artery of native heart without angina pectoris Managed by cardiology  2. Gastroesophageal reflux disease, esophagitis presence not specified   3. History of colonic polyps See Below  4. Essential hypertension Blood pressure at goal. Continue current medications. Check lab to monitor. - COMPLETE METABOLIC PANEL WITH GFR  5. Gout without tophus, unspecified cause, unspecified chronicity, unspecified site  6. Hyperlipidemia - COMPLETE METABOLIC PANEL WITH GFR - Lipid panel  7. 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  8. Anxiety Controlled with current medication.  9. Obesity  10. Hiatal hernia   11. Diabetes mellitus without complication - Hemoglobin A1c - Microalbumin, urine  So far this has been controlled without medications. I exam May 2016 Foot exam entered into [employer] 01/2015   12. Bilateral leg edema Conrolled with current  medications.  13. Insomnia Controlled with current medications.        Medicare annual wellness visit, subsequent She had this performed 07/2014. Will repeat this annually.   Visit for preventive health examination Had this performed 07/2014. We'll repeat this annually. A. Screening Labs: She came and had fasting labs on 08/17/2014. All were stable/good.    B. Pap: No Pap indicated. History of complete hysterectomy. No cancer.   Also, Says she just saw Dr. Corinna Capra, Gyn 08/11/2014  C. Screening Mammogram:  10/08/2013---[facility] ---Negative -----------------------------------------09/2014----------------Negative  D. DEXA/BMD: At [place] 07/2014--Reviewed her chart and do not see that she has had one of these in the past.  She has no risk factors for osteoporosis other than having a hysterectomy at an early age. Says her ovaries were removed  at that time-- Pt agreeable for me to schedule DEXA DEXA scan was performed 02/04/15. Normal. Was told to continue calcium vitamin D and weightbearing exercise and can wait 5 years to repeat DEXA/BMD.    E. Colorectal Cancer Screening: Last colonoscopy was October 2011. I do not have the report. However patient states that she is to repeat 5 years which means this is due October 2016.  F. Glaucoma Screen:  07/2014--Reported---- her last eye exam was about 2 years ago---showed no glaucoma, no diabetic retinopathy. Pt agreeable to schedule f/u Eye Exam. Had repeat eye exam----12/2014----Negative eval except they adjusted her reading glasses  G. Immunizations:  Influenza: ---------------------------- Received 07/02/2014 Tetanus: ---------------------------- Tdap Given Here 01/25/2012 Pneumococcal: Prevnar 13--------Given here 08/18/2013                          Pneumovax 23----Given here 02/25/2014 Zostavax: ------------------------------  Received 07/02/2014  Regular office visit 6 months or sooner if needed   Signed, [facility] [place], [place],  [facility] 02/17/2015 11:10 AM

## 2015-02-18 ENCOUNTER — Telehealth: Payer: Self-pay | Admitting: Family Medicine

## 2015-02-18 LAB — HEMOGLOBIN A1C
Hgb A1c MFr Bld: 6.3 % — ABNORMAL HIGH (ref <5.7)
Mean Plasma Glucose: 134 mg/dL — ABNORMAL HIGH (ref <117)

## 2015-02-18 LAB — MICROALBUMIN, URINE: Microalb, Ur: <0.2 mg/dL (ref <2.0)

## 2015-02-18 NOTE — Telephone Encounter (Signed)
Pt aware of lab results and provider recommendations.  Understands to mention result to Cardiologist next month at next visit.  Zetia removed from med list.

## 2015-02-18 NOTE — Telephone Encounter (Signed)
-----   Message from Orlena Sheldon, PA-C sent at 02/18/2015  7:56 AM EDT ----- Cholesterol has gotten very low. She is currently on Lipitor 80 and Zetia. She could probably stop the Zetia and just continue the Lipitor 80. Tell her to discuss this at her next visit with her cardiologist. Continue current medications until she discusses with them. A1c is stable. Other labs look good as well.

## 2015-02-22 ENCOUNTER — Encounter: Payer: Self-pay | Admitting: Family Medicine

## 2015-02-23 DIAGNOSIS — J309 Allergic rhinitis, unspecified: Secondary | ICD-10-CM | POA: Diagnosis not present

## 2015-02-26 DIAGNOSIS — J309 Allergic rhinitis, unspecified: Secondary | ICD-10-CM | POA: Diagnosis not present

## 2015-03-02 ENCOUNTER — Other Ambulatory Visit: Payer: Self-pay | Admitting: Internal Medicine

## 2015-03-02 DIAGNOSIS — J309 Allergic rhinitis, unspecified: Secondary | ICD-10-CM | POA: Diagnosis not present

## 2015-03-02 NOTE — Telephone Encounter (Signed)
Rx(s) sent to pharmacy electronically.  

## 2015-03-04 ENCOUNTER — Encounter: Payer: Self-pay | Admitting: Physician Assistant

## 2015-03-08 ENCOUNTER — Encounter: Payer: Self-pay | Admitting: Physician Assistant

## 2015-03-08 ENCOUNTER — Ambulatory Visit (INDEPENDENT_AMBULATORY_CARE_PROVIDER_SITE_OTHER): Payer: Medicare Other | Admitting: Physician Assistant

## 2015-03-08 VITALS — BP 118/76 | HR 78 | Temp 97.6°F | Resp 16 | Wt 232.0 lb

## 2015-03-08 DIAGNOSIS — G4700 Insomnia, unspecified: Secondary | ICD-10-CM | POA: Diagnosis not present

## 2015-03-08 DIAGNOSIS — B029 Zoster without complications: Secondary | ICD-10-CM | POA: Diagnosis not present

## 2015-03-08 MED ORDER — VALACYCLOVIR HCL 1 G PO TABS
1000.0000 mg | ORAL_TABLET | Freq: Three times a day (TID) | ORAL | Status: DC
Start: 2015-03-08 — End: 2015-05-20

## 2015-03-08 MED ORDER — ZOLPIDEM TARTRATE 10 MG PO TABS: 10.0000 mg | ORAL_TABLET | Freq: Every evening | ORAL | Status: DC | PRN

## 2015-03-08 NOTE — Progress Notes (Signed)
Patient ID: Madeline Wood MRN: [id number], DOB: 1946-05-06, 69 y.o. Date of Encounter: 03/08/2015, 9:24 AM    Chief Complaint:  Chief Complaint  Patient presents with  . other    possible shingles has burng and stinging and uncomfortable pain on right side back under bra, and has bump under bra      HPI: 69 y.o. year old AA female presents with above.   Says she just noticed start of any symptoms or bump yesterday morning. Says she feels slight burning, stinging, pain in right back. Had her sister look at it and saw small bump/rash at that spot.  Says it is not really painful/uncomfortable --does not feel she needs med for this.  However, says it is a little uncomfortable, but says it is a different feeling than you get when developing acne type bump, etc.      Home Meds:   Outpatient Prescriptions Prior to Visit  Medication Sig Dispense Refill  . ALPRAZolam (XANAX) 1 MG tablet TAKE 1 TABLET TWICE A DAY AS NEEDED 30 tablet 2  . aspirin 81 MG tablet Take 81 mg by mouth daily.      [name] atorvastatin (LIPITOR) 80 MG tablet TAKE 1 TABLET BY MOUTH EVERY DAY AT 6 PM 30 tablet 5  . Azelastine HCl 0.15 % SOLN   5  . clopidogrel (PLAVIX) 75 MG tablet Take 1 tablet (75 mg total) by mouth daily. 90 tablet 1  . EPIPEN 2-PAK 0.3 MG/0.3ML SOAJ injection USE AS DIRECTED FOR SEVERE ALLERGIC REACTION  1  . ezetimibe (ZETIA) 10 MG tablet Take 1 tablet (10 mg total) by mouth daily. PATIENT NEEDS TO CONTACT OFFICE FOR ADDITIONAL REFILLS 30 tablet 3  . fexofenadine (ALLEGRA) 180 MG tablet Take 1 tablet (180 mg total) by mouth daily. 30 tablet 5  . fish oil-omega-3 fatty acids 1000 MG capsule Take 1 g by mouth. Takes occasionally    . fluticasone (FLONASE) 50 MCG/ACT nasal spray   5  . losartan (COZAAR) 50 MG tablet Take 1 tablet (50 mg total) by mouth daily. 90 tablet 1  . metoprolol (LOPRESSOR) 50 MG tablet TAKE 1 TABLET BY MOUTH IN THE MORNING AND 1&1/2 IN THE EVENING 90 tablet 5  . Misc Natural  Products (GLUCOSAMINE CHOND COMPLEX/MSM PO) Take by mouth. Occasionally    . montelukast (SINGULAIR) 10 MG tablet TAKE 1 TABLET (10 MG TOTAL) BY MOUTH AT BEDTIME. 30 tablet 3  . Multiple Vitamin (MULTIVITAMIN) tablet Take 1 tablet by mouth. occasionally    . nitroGLYCERIN (NITROSTAT) 0.4 MG SL tablet Place 1 tablet (0.4 mg total) under the tongue every 5 (five) minutes as needed for chest pain. 25 tablet 3  . nystatin (MYCOSTATIN) 100000 UNIT/ML suspension Take 5 mLs (500,000 Units total) by mouth 4 (four) times daily. 60 mL 0  . nystatin (MYCOSTATIN) 100000 UNIT/ML suspension Take 5 mLs (500,000 Units total) by mouth 4 (four) times daily. 180 mL 1  . pantoprazole (PROTONIX) 40 MG tablet TAKE 1 TABLET EVERY DAY 30 tablet 2  . triamterene-hydrochlorothiazide (MAXZIDE-25) 37.5-25 MG per tablet take 1 tablet by mouth once daily 90 tablet 3  . zoster vaccine live, PF, (ZOSTAVAX) 56314 UNT/0.65ML injection Inject 19,400 Units into the skin once. 1 each 0  . zolpidem (AMBIEN) 10 MG tablet Take 1 tablet (10 mg total) by mouth at bedtime as needed for sleep. 15 tablet 1   No facility-administered medications prior to visit.    Allergies:  Allergies  Allergen  Reactions  . Ciprofloxacin   . Citalopram Hydrobromide   . Escitalopram Oxalate   . Paroxetine   . Polysporin [Bacitracin-Polymyxin B]       Review of Systems: See HPI for pertinent ROS. All other ROS negative.    Physical Exam: Blood pressure 118/76, pulse 78, temperature 97.6 F (36.4 C), temperature source Oral, resp. rate 16, weight 232 lb (105.235 kg)., Body mass index is 39.46 kg/(m^2). General:  Obese AAF. Appears in no acute distress. Neck: Supple. No thyromegaly. No lymphadenopathy. Lungs: Clear bilaterally to auscultation without wheezes, rales, or rhonchi. Breathing is unlabored. Heart: Regular rhythm. No murmurs, rubs, or gallops. Msk:  Strength and tone normal for age. Skin:  Right Side Of Back: Approx T7 Level: there is  approx 1 cm diameter area that is minimal erythema, minimally raised. There is tiny 77mm vessicle on top/center. No other area of rash/skin lesion.  Neuro: Alert and oriented X 3. Moves all extremities spontaneously. Gait is normal. CNII-XII grossly in tact. Psych:  Responds to questions appropriately with a normal affect.     ASSESSMENT AND PLAN:  69 y.o. year old female with  1. Herpes zoster Explained to her that there is such minimal findings on physical exam that it is difficult to dx for certain. Also symptoms just started yesterday.  However, discussed that will treat as Shingles at this time but for her to call me if sign or symptoms change.  Discussed that even if it is shingles and even if start med now, it is possible that she could develop further skin changes--discussed what these would be like.  Discussed that it is possible that this is beginning of a bacterial infection--if develops abscess/boil type rash/skin changes, call me.  Also,discussed- if it is shingles and pain worsens, call--can add neurontin, etc if needed.  - valACYclovir (VALTREX) 1000 MG tablet; Take 1 tablet (1,000 mg total) by mouth 3 (three) times daily.  Dispense: 21 tablet; Refill: 0  2. Insomnia She requests refill: - zolpidem (AMBIEN) 10 MG tablet; Take 1 tablet (10 mg total) by mouth at bedtime as needed for sleep.  Dispense: 30 tablet; Refill: 2   Signed, [address] [place], [place], [facility] 03/08/2015 9:24 AM

## 2015-03-09 DIAGNOSIS — J309 Allergic rhinitis, unspecified: Secondary | ICD-10-CM | POA: Diagnosis not present

## 2015-03-16 DIAGNOSIS — J309 Allergic rhinitis, unspecified: Secondary | ICD-10-CM | POA: Diagnosis not present

## 2015-03-24 DIAGNOSIS — J309 Allergic rhinitis, unspecified: Secondary | ICD-10-CM | POA: Diagnosis not present

## 2015-03-30 DIAGNOSIS — J309 Allergic rhinitis, unspecified: Secondary | ICD-10-CM | POA: Diagnosis not present

## 2015-04-03 ENCOUNTER — Other Ambulatory Visit: Payer: Self-pay | Admitting: Internal Medicine

## 2015-04-05 NOTE — Telephone Encounter (Signed)
Rx(s) sent to pharmacy electronically.  

## 2015-04-06 DIAGNOSIS — J309 Allergic rhinitis, unspecified: Secondary | ICD-10-CM | POA: Diagnosis not present

## 2015-04-07 DIAGNOSIS — J309 Allergic rhinitis, unspecified: Secondary | ICD-10-CM | POA: Diagnosis not present

## 2015-04-08 DIAGNOSIS — J309 Allergic rhinitis, unspecified: Secondary | ICD-10-CM | POA: Diagnosis not present

## 2015-04-13 DIAGNOSIS — J309 Allergic rhinitis, unspecified: Secondary | ICD-10-CM | POA: Diagnosis not present

## 2015-04-20 ENCOUNTER — Other Ambulatory Visit: Payer: Self-pay | Admitting: Physician Assistant

## 2015-04-20 NOTE — Telephone Encounter (Signed)
Medication refilled per protocol. 

## 2015-04-21 ENCOUNTER — Other Ambulatory Visit: Payer: Self-pay | Admitting: Physician Assistant

## 2015-04-21 DIAGNOSIS — J309 Allergic rhinitis, unspecified: Secondary | ICD-10-CM | POA: Diagnosis not present

## 2015-04-22 NOTE — Telephone Encounter (Signed)
Ok to refill??  Last office visit 03/08/2015.  Last refill 02/03/2015, #2 refills.

## 2015-04-22 NOTE — Telephone Encounter (Signed)
Medication refilled per protocol. 

## 2015-04-22 NOTE — Telephone Encounter (Signed)
Approved. #30+2. 

## 2015-04-27 DIAGNOSIS — J309 Allergic rhinitis, unspecified: Secondary | ICD-10-CM | POA: Diagnosis not present

## 2015-05-02 ENCOUNTER — Other Ambulatory Visit: Payer: Self-pay | Admitting: Internal Medicine

## 2015-05-04 DIAGNOSIS — J302 Other seasonal allergic rhinitis: Secondary | ICD-10-CM | POA: Insufficient documentation

## 2015-05-04 DIAGNOSIS — J309 Allergic rhinitis, unspecified: Secondary | ICD-10-CM | POA: Diagnosis not present

## 2015-05-04 DIAGNOSIS — H1010 Acute atopic conjunctivitis, unspecified eye: Secondary | ICD-10-CM | POA: Insufficient documentation

## 2015-05-04 DIAGNOSIS — J3089 Other allergic rhinitis: Principal | ICD-10-CM

## 2015-05-04 NOTE — Telephone Encounter (Signed)
Rx request sent to pharmacy.  

## 2015-05-10 ENCOUNTER — Encounter: Payer: Self-pay | Admitting: Internal Medicine

## 2015-05-11 DIAGNOSIS — J309 Allergic rhinitis, unspecified: Secondary | ICD-10-CM | POA: Diagnosis not present

## 2015-05-17 DIAGNOSIS — J309 Allergic rhinitis, unspecified: Secondary | ICD-10-CM | POA: Diagnosis not present

## 2015-05-20 ENCOUNTER — Ambulatory Visit (INDEPENDENT_AMBULATORY_CARE_PROVIDER_SITE_OTHER): Payer: Medicare Other | Admitting: Internal Medicine

## 2015-05-20 ENCOUNTER — Encounter: Payer: Self-pay | Admitting: Internal Medicine

## 2015-05-20 VITALS — BP 142/84 | HR 59 | Ht 66.0 in | Wt 232.0 lb

## 2015-05-20 DIAGNOSIS — E119 Type 2 diabetes mellitus without complications: Secondary | ICD-10-CM

## 2015-05-20 DIAGNOSIS — I10 Essential (primary) hypertension: Secondary | ICD-10-CM | POA: Diagnosis not present

## 2015-05-20 DIAGNOSIS — E785 Hyperlipidemia, unspecified: Secondary | ICD-10-CM

## 2015-05-20 DIAGNOSIS — I2510 Atherosclerotic heart disease of native coronary artery without angina pectoris: Secondary | ICD-10-CM

## 2015-05-20 DIAGNOSIS — F419 Anxiety disorder, unspecified: Secondary | ICD-10-CM

## 2015-05-20 MED ORDER — NITROGLYCERIN 0.4 MG SL SUBL: 0.4000 mg | SUBLINGUAL_TABLET | SUBLINGUAL | Status: DC | PRN

## 2015-05-20 NOTE — Patient Instructions (Signed)
Your physician recommends that you schedule a follow-up appointment in: [place] [address] HIL[place]

## 2015-05-20 NOTE — Progress Notes (Signed)
OFFICE NOTE  Chief Complaint:   No complaints  Primary Care Physician: Karis Juba, PA-C  HPI:  Madeline Wood is a 69 year old female patient formerly followed by Dr. Rollene Fare, who works as a Photographer. She has a history of coronary disease and had a drug-eluting stent placed to the distal RCA in 2006. She had a negative Myoview in 2012. Just has a history of obesity, hypertension and dyslipidemia. In 2012 underwent back surgery by Dr. Lynann Bologna and has had a good recovery from that.  Recently she had some problems with low blood pressure and had a decrease in her losartan dose to 50 mg once daily which has helped. She has however been having some lower extremity swelling.  When she last saw Dr. Rollene Fare in April 2014, he recommended starting her Dyazide to one full tablet daily. She did not start that given her concern about the change in her blood pressure. It is noted however her blood pressure is slightly higher today 150/84. She is asymptomatic, denying any chest pain, shortness of breath, palpitations, presyncope or syncopal symptoms.  Madeline Wood returns today for followup.  She denies any chest pain or worsening shortness of breath. Recent laboratory work shows good control of her cholesterol.  She is currently on atorvastatin 80 mg daily and recently had that he had discontinued due to excellent cholesterol control. She is also on aspirin and Plavix as well as losartan and Lopressor.  PMHx:  Past Medical History  Diagnosis Date  . Hypertension   . Gout   . Hyperlipidemia   . 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 Anxiety   . Obesity   . Knee pain   . Positive TB test   . Hiatal hernia   . GERD (gastroesophageal reflux disease)   . CAD (coronary artery disease)     stent of distal RCA in 2006  . Diabetes mellitus without complication   . Venous insufficiency   . History of nuclear stress test 03/2011    negative lexiscan myoview; normal perfusion    Past Surgical History   Procedure Laterality Date  . Abdominal hysterectomy    . Breast lumpectomy    . Back surgery  2012  . Coronary angioplasty with stent placement  06/29/2005    Taxus 2.5x56mm DES to distal RCA (Dr. Tami Ribas)  . Transthoracic echocardiogram  12/20/2012    EF 91-47%, normal systolic function, mild hypokinesis of inf myocardium; calcified MV annulua, LA & RA mildly dilated    FAMHx:  Family History  Problem Relation Age of Onset  . Diabetes Sister   . Cancer Brother     colon  . Cancer Paternal Uncle     colon  . Ovarian cancer Mother   . Other Father     homicide  . Heart attack Brother     x2  . Hypertension Brother   . Hypertension Sister     SOCHx:   reports that she quit smoking about 13 years ago. She has never used smokeless tobacco. She reports that she does not drink alcohol or use illicit drugs.  ALLERGIES:  Allergies  Allergen Reactions  . Ciprofloxacin   . Citalopram Hydrobromide   . Escitalopram Oxalate   . Paroxetine   . Polysporin [Bacitracin-Polymyxin B]     ROS: A comprehensive review of systems was negative.  HOME MEDS: Current Outpatient Prescriptions  Medication Sig Dispense Refill  . ALPRAZolam (XANAX) 1 MG tablet TAKE 1 TABLET BY MOUTH TWICE A DAY  AS NEEDED 30 tablet 2  . aspirin 81 MG tablet Take 81 mg by mouth daily.      [name] atorvastatin (LIPITOR) 80 MG tablet TAKE 1 TABLET EVERY DAY 30 tablet 1  . Azelastine HCl (ASTEPRO) 0.15 % SOLN Place 1-2 sprays into the nose daily.    . Azelastine HCl 0.15 % SOLN   5  . clopidogrel (PLAVIX) 75 MG tablet Take 1 tablet (75 mg total) by mouth daily. 90 tablet 1  . EPIPEN 2-PAK 0.3 MG/0.3ML SOAJ injection USE AS DIRECTED FOR SEVERE ALLERGIC REACTION  1  . fexofenadine (ALLEGRA) 180 MG tablet Take 1 tablet (180 mg total) by mouth daily. 30 tablet 5  . fish oil-omega-3 fatty acids 1000 MG capsule Take 1 g by mouth. Takes occasionally    . fluticasone (FLONASE) 50 MCG/ACT nasal spray   5  . losartan (COZAAR)  50 MG tablet Take 1 tablet (50 mg total) by mouth daily. 90 tablet 1  . metoprolol (LOPRESSOR) 50 MG tablet TAKE 1 TABLET BY MOUTH IN THE MORNING AND 1&1/2 IN THE EVENING 90 tablet 5  . Misc Natural Products (GLUCOSAMINE CHOND COMPLEX/MSM PO) Take by mouth. Occasionally    . montelukast (SINGULAIR) 10 MG tablet TAKE 1 TABLET (10 MG TOTAL) BY MOUTH AT BEDTIME. 30 tablet 3  . Multiple Vitamin (MULTIVITAMIN) tablet Take 1 tablet by mouth. occasionally    . nitroGLYCERIN (NITROSTAT) 0.4 MG SL tablet Place 1 tablet (0.4 mg total) under the tongue every 5 (five) minutes as needed for chest pain. 25 tablet 3  . nystatin (MYCOSTATIN) 100000 UNIT/ML suspension Take 5 mLs (500,000 Units total) by mouth 4 (four) times daily. 60 mL 0  . nystatin (MYCOSTATIN) 100000 UNIT/ML suspension Take 5 mLs (500,000 Units total) by mouth 4 (four) times daily. 180 mL 1  . pantoprazole (PROTONIX) 40 MG tablet TAKE 1 TABLET EVERY DAY 30 tablet 2  . triamterene-hydrochlorothiazide (MAXZIDE-25) 37.5-25 MG per tablet take 1 tablet by mouth once daily 90 tablet 3  . zoster vaccine live, PF, (ZOSTAVAX) 08676 UNT/0.65ML injection Inject 19,400 Units into the skin once. 1 each 0  . zolpidem (AMBIEN) 10 MG tablet Take 1 tablet (10 mg total) by mouth at bedtime as needed for sleep. 30 tablet 2  . [DISCONTINUED] omeprazole (PRILOSEC OTC) 20 MG tablet Take 1 tablet (20 mg total) by mouth daily. 30 tablet 1   No current facility-administered medications for this visit.    LABS/IMAGING: No results found for this or any previous visit (from the past 48 hour(s)). No results found.  VITALS: BP 142/84 mmHg  Pulse 59  Ht 5\' 6"  (1.676 m)  Wt 232 lb (105.235 kg)  BMI 37.46 kg/m2  EXAM: General appearance: alert and no distress Neck: no adenopathy, no carotid bruit, no JVD, supple, symmetrical, trachea midline and thyroid not enlarged, symmetric, no tenderness/mass/nodules Lungs: clear to auscultation bilaterally Heart: regular rate  and rhythm, S1, S2 normal, no murmur, click, rub or gallop Abdomen: soft, non-tender; bowel sounds normal; no masses,  no organomegaly and obese Extremities: edema 1+ bilaterally and no venous stasis changes or varicose veins Pulses: 2+ and symmetric Skin: Skin color, texture, turgor normal. No rashes or lesions Neurologic: Grossly normal Psych: Pleasant mood, affect  EKG:  sinus bradycardia 59  ASSESSMENT: 1. Coronary disease status post PCI to the distal RCA in 2006 (DES) 2. Obesity 3. Hypertension- at goal  4. Dyslipidemia  PLAN: 1.   Madeline Wood is  Doing well overall. She  denies any anginal symptoms. Her cholesterol was recently checked and is at goal, in fact she was taken off of Zetia. Blood pressure is at goal today. Overall she is doing well and I recommend followup in 1 year.  Pixie Casino, MD, [facility] Attending Cardiologist [place] C H[place] 05/20/2015, 5:55 PM

## 2015-05-22 ENCOUNTER — Other Ambulatory Visit: Payer: Self-pay | Admitting: Internal Medicine

## 2015-05-25 ENCOUNTER — Encounter: Payer: Self-pay | Admitting: Internal Medicine

## 2015-05-25 ENCOUNTER — Ambulatory Visit (INDEPENDENT_AMBULATORY_CARE_PROVIDER_SITE_OTHER): Payer: Medicare Other

## 2015-05-25 DIAGNOSIS — J302 Other seasonal allergic rhinitis: Principal | ICD-10-CM

## 2015-05-25 DIAGNOSIS — H1010 Acute atopic conjunctivitis, unspecified eye: Secondary | ICD-10-CM

## 2015-05-25 DIAGNOSIS — J309 Allergic rhinitis, unspecified: Secondary | ICD-10-CM | POA: Diagnosis not present

## 2015-05-25 DIAGNOSIS — J3089 Other allergic rhinitis: Principal | ICD-10-CM

## 2015-05-31 ENCOUNTER — Ambulatory Visit: Payer: Medicaid Other | Admitting: Internal Medicine

## 2015-06-01 ENCOUNTER — Ambulatory Visit (INDEPENDENT_AMBULATORY_CARE_PROVIDER_SITE_OTHER): Payer: Medicare Other | Admitting: *Deleted

## 2015-06-01 ENCOUNTER — Ambulatory Visit: Payer: Medicaid Other | Admitting: Internal Medicine

## 2015-06-01 DIAGNOSIS — Z23 Encounter for immunization: Secondary | ICD-10-CM

## 2015-06-04 ENCOUNTER — Ambulatory Visit (INDEPENDENT_AMBULATORY_CARE_PROVIDER_SITE_OTHER): Payer: Medicare Other | Admitting: Neurology

## 2015-06-04 DIAGNOSIS — J309 Allergic rhinitis, unspecified: Secondary | ICD-10-CM | POA: Diagnosis not present

## 2015-06-11 ENCOUNTER — Ambulatory Visit (INDEPENDENT_AMBULATORY_CARE_PROVIDER_SITE_OTHER): Payer: Medicare Other

## 2015-06-11 DIAGNOSIS — H1010 Acute atopic conjunctivitis, unspecified eye: Secondary | ICD-10-CM | POA: Diagnosis not present

## 2015-06-11 DIAGNOSIS — J302 Other seasonal allergic rhinitis: Principal | ICD-10-CM

## 2015-06-11 DIAGNOSIS — J309 Allergic rhinitis, unspecified: Secondary | ICD-10-CM

## 2015-06-11 DIAGNOSIS — J3089 Other allergic rhinitis: Principal | ICD-10-CM

## 2015-06-18 ENCOUNTER — Ambulatory Visit (INDEPENDENT_AMBULATORY_CARE_PROVIDER_SITE_OTHER): Payer: Medicare Other | Admitting: Neurology

## 2015-06-18 DIAGNOSIS — J309 Allergic rhinitis, unspecified: Secondary | ICD-10-CM

## 2015-06-28 ENCOUNTER — Ambulatory Visit (INDEPENDENT_AMBULATORY_CARE_PROVIDER_SITE_OTHER): Payer: Medicare Other | Admitting: *Deleted

## 2015-06-28 DIAGNOSIS — J309 Allergic rhinitis, unspecified: Secondary | ICD-10-CM

## 2015-06-30 ENCOUNTER — Other Ambulatory Visit: Payer: Self-pay | Admitting: Internal Medicine

## 2015-07-01 ENCOUNTER — Other Ambulatory Visit: Payer: Self-pay | Admitting: Internal Medicine

## 2015-07-01 MED ORDER — LOSARTAN POTASSIUM 50 MG PO TABS: 50.0000 mg | ORAL_TABLET | Freq: Every day | ORAL | Status: DC

## 2015-07-01 MED ORDER — ATORVASTATIN CALCIUM 80 MG PO TABS: 80.0000 mg | ORAL_TABLET | Freq: Every day | ORAL | Status: DC

## 2015-07-02 ENCOUNTER — Other Ambulatory Visit: Payer: Self-pay | Admitting: Family Medicine

## 2015-07-02 MED ORDER — TRIAMTERENE-HCTZ 37.5-25 MG PO TABS: ORAL_TABLET | ORAL | Status: DC

## 2015-07-02 NOTE — Telephone Encounter (Signed)
Medication refilled per protocol. 

## 2015-07-05 ENCOUNTER — Telehealth: Payer: Self-pay | Admitting: *Deleted

## 2015-07-05 MED ORDER — TRIAMTERENE-HCTZ 37.5-25 MG PO TABS: ORAL_TABLET | ORAL | Status: DC

## 2015-07-05 NOTE — Telephone Encounter (Signed)
Received fax requesting refill on Maxide.   Refill appropriate and filled per protocol.

## 2015-07-06 ENCOUNTER — Ambulatory Visit (INDEPENDENT_AMBULATORY_CARE_PROVIDER_SITE_OTHER): Payer: Medicare Other

## 2015-07-06 DIAGNOSIS — J309 Allergic rhinitis, unspecified: Secondary | ICD-10-CM | POA: Diagnosis not present

## 2015-07-08 ENCOUNTER — Other Ambulatory Visit: Payer: Self-pay | Admitting: Family Medicine

## 2015-07-08 MED ORDER — ALPRAZOLAM 1 MG PO TABS: 1.0000 mg | ORAL_TABLET | Freq: Two times a day (BID) | ORAL | Status: DC | PRN

## 2015-07-08 NOTE — Telephone Encounter (Signed)
LRF 04/22/15 #30 + 2  LOV 03/08/15  OK refill?

## 2015-07-08 NOTE — Telephone Encounter (Signed)
Approved. #30+2. 

## 2015-07-08 NOTE — Telephone Encounter (Signed)
Medication refilled per protocol. 

## 2015-07-12 ENCOUNTER — Encounter: Payer: Self-pay | Admitting: Gastroenterology

## 2015-07-14 ENCOUNTER — Ambulatory Visit (INDEPENDENT_AMBULATORY_CARE_PROVIDER_SITE_OTHER): Payer: Medicare Other

## 2015-07-14 DIAGNOSIS — J301 Allergic rhinitis due to pollen: Secondary | ICD-10-CM

## 2015-07-16 ENCOUNTER — Other Ambulatory Visit: Payer: Self-pay | Admitting: Physician Assistant

## 2015-07-16 NOTE — Telephone Encounter (Signed)
Medication refilled per protocol. 

## 2015-07-21 ENCOUNTER — Other Ambulatory Visit: Payer: Self-pay | Admitting: Physician Assistant

## 2015-07-21 ENCOUNTER — Ambulatory Visit (INDEPENDENT_AMBULATORY_CARE_PROVIDER_SITE_OTHER): Payer: Medicare Other | Admitting: Neurology

## 2015-07-21 DIAGNOSIS — J309 Allergic rhinitis, unspecified: Secondary | ICD-10-CM

## 2015-07-21 NOTE — Telephone Encounter (Signed)
Refill appropriate and filled per protocol. 

## 2015-07-28 ENCOUNTER — Ambulatory Visit (INDEPENDENT_AMBULATORY_CARE_PROVIDER_SITE_OTHER): Payer: Medicare Other | Admitting: Neurology

## 2015-07-28 DIAGNOSIS — J309 Allergic rhinitis, unspecified: Secondary | ICD-10-CM

## 2015-07-29 DIAGNOSIS — J3089 Other allergic rhinitis: Secondary | ICD-10-CM | POA: Diagnosis not present

## 2015-07-30 DIAGNOSIS — J301 Allergic rhinitis due to pollen: Secondary | ICD-10-CM | POA: Diagnosis not present

## 2015-08-05 ENCOUNTER — Ambulatory Visit (INDEPENDENT_AMBULATORY_CARE_PROVIDER_SITE_OTHER): Payer: Medicare Other

## 2015-08-05 DIAGNOSIS — J309 Allergic rhinitis, unspecified: Secondary | ICD-10-CM | POA: Diagnosis not present

## 2015-08-12 ENCOUNTER — Ambulatory Visit (INDEPENDENT_AMBULATORY_CARE_PROVIDER_SITE_OTHER): Payer: Medicare Other

## 2015-08-12 DIAGNOSIS — J309 Allergic rhinitis, unspecified: Secondary | ICD-10-CM

## 2015-08-13 ENCOUNTER — Ambulatory Visit: Payer: Medicare Other | Admitting: Physician Assistant

## 2015-08-18 ENCOUNTER — Ambulatory Visit (INDEPENDENT_AMBULATORY_CARE_PROVIDER_SITE_OTHER): Payer: Medicare Other

## 2015-08-18 DIAGNOSIS — J309 Allergic rhinitis, unspecified: Secondary | ICD-10-CM | POA: Diagnosis not present

## 2015-08-26 ENCOUNTER — Encounter: Payer: Medicaid Other | Admitting: Physician Assistant

## 2015-08-27 ENCOUNTER — Ambulatory Visit (INDEPENDENT_AMBULATORY_CARE_PROVIDER_SITE_OTHER): Payer: Medicare Other

## 2015-08-27 DIAGNOSIS — J309 Allergic rhinitis, unspecified: Secondary | ICD-10-CM | POA: Diagnosis not present

## 2015-09-02 ENCOUNTER — Ambulatory Visit (INDEPENDENT_AMBULATORY_CARE_PROVIDER_SITE_OTHER): Payer: Medicare Other

## 2015-09-02 DIAGNOSIS — J309 Allergic rhinitis, unspecified: Secondary | ICD-10-CM

## 2015-09-07 ENCOUNTER — Ambulatory Visit: Payer: Medicare Other | Admitting: Allergy and Immunology

## 2015-09-14 ENCOUNTER — Ambulatory Visit: Payer: Medicare Other | Admitting: Allergy and Immunology

## 2015-09-14 ENCOUNTER — Encounter: Payer: Self-pay | Admitting: Allergy and Immunology

## 2015-09-14 ENCOUNTER — Ambulatory Visit (INDEPENDENT_AMBULATORY_CARE_PROVIDER_SITE_OTHER): Payer: Medicare Other | Admitting: Allergy and Immunology

## 2015-09-14 VITALS — BP 130/80 | HR 75 | Resp 16

## 2015-09-14 DIAGNOSIS — K219 Gastro-esophageal reflux disease without esophagitis: Secondary | ICD-10-CM

## 2015-09-14 DIAGNOSIS — H1010 Acute atopic conjunctivitis, unspecified eye: Secondary | ICD-10-CM | POA: Diagnosis not present

## 2015-09-14 DIAGNOSIS — J309 Allergic rhinitis, unspecified: Secondary | ICD-10-CM | POA: Diagnosis not present

## 2015-09-14 DIAGNOSIS — J3089 Other allergic rhinitis: Principal | ICD-10-CM

## 2015-09-14 DIAGNOSIS — J302 Other seasonal allergic rhinitis: Secondary | ICD-10-CM

## 2015-09-14 NOTE — Assessment & Plan Note (Signed)
   Continue aeroallergen immunotherapy as prescribed and as tolerated.  Hold montelukast for now.  If symptoms remain well controlled despite holding this medication, she will discontinue montelukast.  I have encouraged her to use fexofenadine as needed rather than on a digital daily basis.  Continue fluticasone nasal spray as needed and/or azelastine nasal spray as needed.

## 2015-09-14 NOTE — Patient Instructions (Signed)
Allergic rhinoconjunctivitis, seasonal and perennial  Continue aeroallergen immunotherapy as prescribed and as tolerated.  Hold montelukast for now.  If symptoms remain well controlled despite holding this medication, she will discontinue montelukast.  I have encouraged her to use fexofenadine as needed rather than on a digital daily basis.  Continue fluticasone nasal spray as needed and/or azelastine nasal spray as needed.  GERD (gastroesophageal reflux disease)  For now, continue appropriate lifestyle modifications and pantoprazole 40 mg daily.   Return in about 6 months (around 03/13/2016), or if symptoms worsen or fail to improve.

## 2015-09-14 NOTE — Assessment & Plan Note (Addendum)
   For now, continue appropriate lifestyle modifications and pantoprazole 40 mg daily.

## 2015-09-14 NOTE — Progress Notes (Signed)
Follow-up Note  RE: Madeline Wood MRN: [id number] DOB: May 08, 1946 Date of Office Visit: 09/14/2015  Primary care provider: Karis Juba, PA-C Referring provider: Orlena Sheldon, PA-C  History of present illness: HPI Comments: Madeline Wood is a 70 y.o. female with allergic rhinoconjunctivitis on immunotherapy and gastroesophageal reflux disease who presents today for follow up. She was most recently seen ere for an office visit in January 2016.  She has experienced significant rhinitis symptom reduction while on aeroallergen immunotherapy.  She denies problems or complications with immunotherapy buildup and will be moving on to maintenance dose immunotherapy later this month.  She currently takes montelukast 10 mg daily, fexofenadine 180 mg daily, fluticasone nasal spray as needed, and/or azelastine nasal spray as needed.  She has no reflux related complaints today.   Assessment and plan: Allergic rhinoconjunctivitis, seasonal and perennial  Continue aeroallergen immunotherapy as prescribed and as tolerated.  Hold montelukast for now.  If symptoms remain well controlled despite holding this medication, she will discontinue montelukast.  I have encouraged her to use fexofenadine as needed rather than on a digital daily basis.  Continue fluticasone nasal spray as needed and/or azelastine nasal spray as needed.  GERD (gastroesophageal reflux disease)  For now, continue appropriate lifestyle modifications and pantoprazole 40 mg daily.      Physical examination: Blood pressure 130/80, pulse 75, resp. rate 16.  General: Alert, interactive, in no acute distress. HEENT: TMs pearly gray, turbinates minimally edematous without discharge, post-pharynx mildly erythematous. Neck: Supple without lymphadenopathy. Lungs: Clear to auscultation without wheezing, rhonchi or rales. CV: Normal S1, S2 without murmurs. Skin: Warm and dry, without lesions or rashes.  The following portions of  the patient's history were reviewed and updated as appropriate: allergies, current medications, past family history, past medical history, past social history, past surgical history and problem list.    Medication List       This list is accurate as of: 09/14/15  1:03 PM.  Always use your most recent med list.               ALPRAZolam 1 MG tablet  Commonly known as:  XANAX  Take 1 tablet (1 mg total) by mouth 2 (two) times daily as needed.     aspirin 81 MG tablet  Take 81 mg by mouth daily.     atorvastatin 80 MG tablet  Commonly known as:  LIPITOR  Take 1 tablet (80 mg total) by mouth daily.     ASTEPRO 0.15 % Soln  Generic drug:  Azelastine HCl  Place 1-2 sprays into the nose daily.     Azelastine HCl 0.15 % Soln     clopidogrel 75 MG tablet  Commonly known as:  PLAVIX  Take 1 tablet (75 mg total) by mouth daily.     clopidogrel 75 MG tablet  Commonly known as:  PLAVIX  TAKE 1 TABLET EVERY DAY     EPIPEN 2-PAK 0.3 mg/0.3 mL Soaj injection  Generic drug:  EPINEPHrine  USE AS DIRECTED FOR SEVERE ALLERGIC REACTION     fexofenadine 180 MG tablet  Commonly known as:  ALLEGRA  Take 1 tablet (180 mg total) by mouth daily.     fish oil-omega-3 fatty acids 1000 MG capsule  Take 1 g by mouth. Takes occasionally     fluticasone 50 MCG/ACT nasal spray  Commonly known as:  FLONASE     GLUCOSAMINE CHOND COMPLEX/MSM PO  Take by mouth. Occasionally     losartan 50  MG tablet  Commonly known as:  COZAAR  Take 1 tablet (50 mg total) by mouth daily.     metoprolol 50 MG tablet  Commonly known as:  LOPRESSOR  TAKE 1 TABLET BY MOUTH IN THE MORNING AND 1&1/2 IN THE EVENING     montelukast 10 MG tablet  Commonly known as:  SINGULAIR  TAKE 1 TABLET (10 MG TOTAL) BY MOUTH AT BEDTIME.     multivitamin tablet  Take 1 tablet by mouth. occasionally     nitroGLYCERIN 0.4 MG SL tablet  Commonly known as:  NITROSTAT  Place 1 tablet (0.4 mg total) under the tongue every 5  (five) minutes as needed for chest pain.     nystatin 100000 UNIT/ML suspension  Commonly known as:  MYCOSTATIN  Take 5 mLs (500,000 Units total) by mouth 4 (four) times daily.     nystatin 100000 UNIT/ML suspension  Commonly known as:  MYCOSTATIN  Take 5 mLs (500,000 Units total) by mouth 4 (four) times daily.     pantoprazole 40 MG tablet  Commonly known as:  PROTONIX  TAKE 1 TABLET EVERY DAY     triamterene-hydrochlorothiazide 37.5-25 MG tablet  Commonly known as:  MAXZIDE-25  take 1 tablet by mouth once daily     zolpidem 10 MG tablet  Commonly known as:  AMBIEN  Take 1 tablet (10 mg total) by mouth at bedtime as needed for sleep.     zolpidem 10 MG tablet  Commonly known as:  AMBIEN  Take 10 mg by mouth at bedtime as needed. for sleep     zoster vaccine live (PF) 19400 UNT/0.65ML injection  Commonly known as:  ZOSTAVAX  Inject 19,400 Units into the skin once.        Allergies  Allergen Reactions  . Ciprofloxacin   . Citalopram Hydrobromide   . Escitalopram Oxalate   . Paroxetine   . Polysporin [Bacitracin-Polymyxin B]     I appreciate the opportunity to take part in this Margareta's care. Please do not hesitate to contact me with questions.  Sincerely,   R. Edgar Frisk, MD

## 2015-09-16 ENCOUNTER — Ambulatory Visit (INDEPENDENT_AMBULATORY_CARE_PROVIDER_SITE_OTHER): Payer: Medicare Other

## 2015-09-16 DIAGNOSIS — J309 Allergic rhinitis, unspecified: Secondary | ICD-10-CM | POA: Diagnosis not present

## 2015-09-28 ENCOUNTER — Other Ambulatory Visit: Payer: Self-pay | Admitting: Physician Assistant

## 2015-09-28 ENCOUNTER — Other Ambulatory Visit: Payer: Self-pay | Admitting: Allergy

## 2015-09-28 MED ORDER — FLUTICASONE PROPIONATE 50 MCG/ACT NA SUSP: 1.0000 | Freq: Two times a day (BID) | NASAL | Status: DC

## 2015-09-28 NOTE — Telephone Encounter (Signed)
Approved. #30+2. 

## 2015-09-28 NOTE — Telephone Encounter (Signed)
rx called in

## 2015-09-28 NOTE — Telephone Encounter (Signed)
Ok to refill??  Last office visit 03/08/2015.  Last refill 07/08/2015, #2 refills.

## 2015-09-29 ENCOUNTER — Other Ambulatory Visit: Payer: Self-pay

## 2015-09-29 ENCOUNTER — Ambulatory Visit (INDEPENDENT_AMBULATORY_CARE_PROVIDER_SITE_OTHER): Payer: Medicare Other

## 2015-09-29 DIAGNOSIS — J309 Allergic rhinitis, unspecified: Secondary | ICD-10-CM | POA: Diagnosis not present

## 2015-09-29 MED ORDER — FLUTICASONE PROPIONATE 50 MCG/ACT NA SUSP: 1.0000 | Freq: Two times a day (BID) | NASAL | Status: DC

## 2015-10-15 ENCOUNTER — Encounter: Payer: Self-pay | Admitting: Allergy and Immunology

## 2015-10-15 ENCOUNTER — Ambulatory Visit (INDEPENDENT_AMBULATORY_CARE_PROVIDER_SITE_OTHER): Payer: Medicare Other | Admitting: Allergy and Immunology

## 2015-10-15 ENCOUNTER — Other Ambulatory Visit: Payer: Self-pay | Admitting: Family Medicine

## 2015-10-15 VITALS — BP 122/72 | HR 72 | Resp 16

## 2015-10-15 DIAGNOSIS — J302 Other seasonal allergic rhinitis: Secondary | ICD-10-CM

## 2015-10-15 DIAGNOSIS — H1010 Acute atopic conjunctivitis, unspecified eye: Secondary | ICD-10-CM

## 2015-10-15 DIAGNOSIS — J309 Allergic rhinitis, unspecified: Secondary | ICD-10-CM

## 2015-10-15 DIAGNOSIS — K130 Diseases of lips: Secondary | ICD-10-CM

## 2015-10-15 DIAGNOSIS — J3089 Other allergic rhinitis: Secondary | ICD-10-CM

## 2015-10-15 DIAGNOSIS — R220 Localized swelling, mass and lump, head: Secondary | ICD-10-CM

## 2015-10-15 NOTE — Telephone Encounter (Signed)
Refill appropriate and filled per protocol. 

## 2015-10-15 NOTE — Patient Instructions (Addendum)
   100% avoidance of Beef, peanuts and all tree nuts.  Epi-pen/benadryl as needed.  Continue Allegra, Singulair and Flonase daily.  Prednisone 30mg  now and 20mg  tomorrow and Sunday.  Saline nasal wash as needed.  Plain vaseline to lips throughout the day.  Return to office for selected food testing off antihistamines 72 hours prior to appointment.  Consider selected labs.

## 2015-10-15 NOTE — Progress Notes (Signed)
FOLLOW UP NOTE  RE: Madeline Wood MRN: [id number] DOB: Dec 07, 1945 [facility] [place] [address]  [id number] Date of Office Visit: 10/15/2015  Subjective:  Madeline Wood is a 70 y.o. female who presents today for Oral Swelling  Assessment:   1. Lip swelling, improving.   2. Allergic rhinoconjunctivitis, seasonal and perennial on immunotherapy.     3.      Complex medical history on multiple medication regime including antihypertensives ( but no ACE inhibitor's).   Plan:   Patient Instructions  1.  100% avoidance of Beef, peanuts and all tree nuts for now. 2.  Epi-pen/benadryl as needed. 3.  Continue Allegra, Singulair and Flonase daily. 4.  Prednisone 30mg  now and 20mg  tomorrow and Sunday. 5.  Saline nasal wash as needed. 6.  Plain vaseline to lips throughout the day and avoid all fragranced, scented and cosmetic lip products. 7.  Return to office for selected food testing off antihistamines 72 hours prior to appointment. 8.  Consider selected labs based upon testing and any further concerns. 9.  Keep Follow-up with Dr. [name] as scheduled and receive injections once resolved and off Prednisone without further concerns.  HPI: Madeline Wood, who is followed in our office with allergic rhinoconjunctivitis on immunotherapy (last injection 2 days ago) and has a history of hypertension, well controlled on losartan, metoprolol, and Maxide returns to the office with complaint of lip swelling.   She reports the swelling began yesterday morning at the bottom, which she only noted once looking in the mirror having awakened at her normal time to start the day.  She felt a sense of puffiness without itching, redness or other skin changes, including no skin changes at her injection sites.  Therefore, she used warm compresses initially with noted relief and did not take additional medications (no Benadryl).  However, with persisting irritation today, she  called the office for further evaluation, though has had 50% improvement and is most bothered with dryness without pain or new skin changes.  She denies bite, sting, trauma or other exposure to her lip.  She denies breathing concerns, difficulty breathing or shortness of breath, swelling of her tongue or throat, dysphagia, GI upset, vomiting or diarrhea.  She has no recollection of ever having a tick bite.  Her dinner the night before included roast beef and [place] sliders with french fries, peanut butter M&M's and almond dark chocolate waffle cracker.  Denies ED or urgent care visits, prednisone or antibiotic courses. Reports sleep and activity are normal.   Madeline Wood has a current medication list which includes the following prescription(s): alprazolam, aspirin, atorvastatin, azelastine hcl, clopidogrel, epipen 2-pak, fexofenadine, fish oil-omega-3 fatty acids, fluticasone, metoprolol, misc natural products, montelukast, multivitamin, nitroglycerin, pantoprazole, losartan, triamterene-hydrochlorothiazide, zolpidem  Drug Allergies: Allergies  Allergen Reactions  . Ciprofloxacin   . Citalopram Hydrobromide   . Escitalopram Oxalate   . Paroxetine   . Polysporin [Bacitracin-Polymyxin B]    Objective:   Filed Vitals:   10/15/15 1101  BP: 122/72  Pulse: 72  Resp: 16   Physical Exam  Constitutional: She is well-developed, well-nourished, and in no distress.  HENT:  Head: Atraumatic.  Right Ear: Tympanic membrane and ear canal normal.  Left Ear: Tympanic membrane and ear canal normal.  Nose: Mucosal edema present. No rhinorrhea. No epistaxis.  Mouth/Throat: Uvula is midline, oropharynx is clear and moist and mucous membranes are normal. No oral lesions. No oropharyngeal exudate, posterior oropharyngeal edema or posterior oropharyngeal  erythema.  Chapped upper and lower lips with minimal identifiable swelling without erythema.  Neck: Neck supple.  Cardiovascular: Normal rate, S1 normal and S2  normal.   No murmur heard. Pulmonary/Chest: Effort normal. She has no wheezes. She has no rhonchi. She has no rales.  Lymphadenopathy:    She has no cervical adenopathy.  Skin: Skin is warm and dry. No rash noted.  No redness.     [name] M. Ishmael Holter, MD  cc: Karis Juba, PA-C

## 2015-10-18 ENCOUNTER — Ambulatory Visit: Payer: Medicare Other | Admitting: Gastroenterology

## 2015-10-18 DIAGNOSIS — J3089 Other allergic rhinitis: Secondary | ICD-10-CM | POA: Diagnosis not present

## 2015-10-19 ENCOUNTER — Ambulatory Visit: Payer: Medicare Other | Admitting: Allergy and Immunology

## 2015-10-19 DIAGNOSIS — J301 Allergic rhinitis due to pollen: Secondary | ICD-10-CM | POA: Diagnosis not present

## 2015-10-26 ENCOUNTER — Ambulatory Visit (INDEPENDENT_AMBULATORY_CARE_PROVIDER_SITE_OTHER): Payer: Medicare Other

## 2015-10-26 DIAGNOSIS — J309 Allergic rhinitis, unspecified: Secondary | ICD-10-CM | POA: Diagnosis not present

## 2015-10-29 ENCOUNTER — Other Ambulatory Visit: Payer: Self-pay

## 2015-10-29 MED ORDER — FLUTICASONE PROPIONATE 50 MCG/ACT NA SUSP: 1.0000 | Freq: Two times a day (BID) | NASAL | Status: DC

## 2015-11-01 ENCOUNTER — Other Ambulatory Visit: Payer: Self-pay

## 2015-11-01 MED ORDER — ATORVASTATIN CALCIUM 80 MG PO TABS: 80.0000 mg | ORAL_TABLET | Freq: Every day | ORAL | Status: DC

## 2015-11-01 NOTE — Telephone Encounter (Signed)
Rx(s) sent to pharmacy electronically.  

## 2015-11-06 ENCOUNTER — Other Ambulatory Visit: Payer: Self-pay | Admitting: Physician Assistant

## 2015-11-08 NOTE — Telephone Encounter (Signed)
Approved. #30+2. 

## 2015-11-08 NOTE — Telephone Encounter (Signed)
Rx called in 

## 2015-11-08 NOTE — Telephone Encounter (Signed)
LRF 07/21/15 #30 + 2   Has appt end of week.  OK refill?

## 2015-11-10 ENCOUNTER — Other Ambulatory Visit: Payer: Self-pay | Admitting: Physician Assistant

## 2015-11-10 NOTE — Telephone Encounter (Signed)
Medication refilled per protocol. 

## 2015-11-11 ENCOUNTER — Encounter: Payer: Self-pay | Admitting: Physician Assistant

## 2015-11-11 ENCOUNTER — Ambulatory Visit (INDEPENDENT_AMBULATORY_CARE_PROVIDER_SITE_OTHER): Payer: Medicare Other | Admitting: Physician Assistant

## 2015-11-11 VITALS — BP 126/76 | HR 68 | Temp 98.1°F | Resp 18 | Ht 64.5 in | Wt 228.0 lb

## 2015-11-11 DIAGNOSIS — Z0000 Encounter for general adult medical examination without abnormal findings: Secondary | ICD-10-CM

## 2015-11-11 LAB — COMPLETE METABOLIC PANEL WITH GFR
ALT: 10 U/L (ref 6–29)
AST: 20 U/L (ref 10–35)
Albumin: 3.7 g/dL (ref 3.6–5.1)
Alkaline Phosphatase: 35 U/L (ref 33–130)
BUN: 18 mg/dL (ref 7–25)
CO2: 27 mmol/L (ref 20–31)
Calcium: 9.4 mg/dL (ref 8.6–10.4)
Chloride: 99 mmol/L (ref 98–110)
Creat: 0.7 mg/dL (ref 0.60–0.93)
GFR, Est African American: >89 mL/min (ref >=60)
GFR, Est Non African American: 88 mL/min (ref >=60)
Glucose, Bld: 98 mg/dL (ref 70–99)
Potassium: 4.8 mmol/L (ref 3.5–5.3)
Sodium: 134 mmol/L — ABNORMAL LOW (ref 135–146)
Total Bilirubin: 0.4 mg/dL (ref 0.2–1.2)
Total Protein: 6.8 g/dL (ref 6.1–8.1)

## 2015-11-11 LAB — CBC WITH DIFFERENTIAL/PLATELET
Basophils Absolute: 0 10*3/uL (ref 0.0–0.1)
Basophils Relative: 1 % (ref 0–1)
Eosinophils Absolute: 0.1 10*3/uL (ref 0.0–0.7)
Eosinophils Relative: 3 % (ref 0–5)
HCT: 36.8 % (ref 36.0–46.0)
Hemoglobin: 12.3 g/dL (ref 12.0–15.0)
Lymphocytes Relative: 38 % (ref 12–46)
Lymphs Abs: 1.7 10*3/uL (ref 0.7–4.0)
MCH: 30.6 pg (ref 26.0–34.0)
MCHC: 33.4 g/dL (ref 30.0–36.0)
MCV: 91.5 fL (ref 78.0–100.0)
MPV: 8.8 fL (ref 8.6–12.4)
Monocytes Absolute: 0.6 10*3/uL (ref 0.1–1.0)
Monocytes Relative: 13 % — ABNORMAL HIGH (ref 3–12)
Neutro Abs: 2 10*3/uL (ref 1.7–7.7)
Neutrophils Relative %: 45 % (ref 43–77)
Platelets: 259 10*3/uL (ref 150–400)
RBC: 4.02 MIL/uL (ref 3.87–5.11)
RDW: 13.5 % (ref 11.5–15.5)
WBC: 4.4 10*3/uL (ref 4.0–10.5)

## 2015-11-11 LAB — LIPID PANEL
Cholesterol: 172 mg/dL (ref 125–200)
HDL: 73 mg/dL (ref >=46)
LDL Cholesterol: 84 mg/dL (ref <130)
Total CHOL/HDL Ratio: 2.4 Ratio (ref <=5.0)
Triglycerides: 77 mg/dL (ref <150)
VLDL: 15 mg/dL (ref <30)

## 2015-11-11 LAB — TSH: TSH: 1.47 mIU/L

## 2015-11-11 MED ORDER — ALPRAZOLAM 1 MG PO TABS: 1.0000 mg | ORAL_TABLET | Freq: Two times a day (BID) | ORAL | Status: DC | PRN

## 2015-11-11 NOTE — Progress Notes (Signed)
Patient ID: WANELL NEWVILLE MRN: [id number], DOB: 1945/10/12, 70 y.o. Date of Encounter: 11/11/2015,   Subjective:   Patient presents for Medicare Annual/Subsequent preventive examination.   Review Past Medical/Family/Social: Each of these is reviewed and updated today.    Risk Factors  Current exercise habits:  She does no exercise. Is active around the house and does errands etc.  Dietary issues discussed:  We have discussed proper diet multiple times and again today. She says she knows what to eat and what not to eat, it's just a matter of doing it!!  Cardiac risk factors: She sees a Film/video editor routinely.   Depression Screen  (Note: if answer to either of the following is "Yes", a more complete depression screening is indicated)  Over the past two weeks, have you felt down, depressed or hopeless? No Over the past two weeks, have you felt little interest or pleasure in doing things? No Have you lost interest or pleasure in daily life? No Do you often feel hopeless? No Do you cry easily over simple problems? No   Activities of Daily Living  In your present state of health, do you have any difficulty performing the following activities?:  Driving? No  Managing money? No  Feeding yourself? No  Getting from bed to chair? No  Climbing a flight of stairs? No  Preparing food and eating?: No  Bathing or showering? No  Getting dressed: No  Getting to the toilet? No  Using the toilet:No  Moving around from place to place: No  In the past year have you fallen or had a near fall?:No  Are you sexually active? No  Do you have more than one partner? No   Hearing Difficulties: No  Do you often ask people to speak up or repeat themselves? No  Do you experience ringing or noises in your ears? No Do you have difficulty understanding soft or whispered voices? No  Do you feel that you have a problem with memory? No Do you often misplace items? No  Do you feel safe at home?  Yes  Cognitive Testing  Alert? Yes Normal Appearance?Yes  Oriented to person? Yes Place? Yes  Time? Yes  Recall of three objects? Yes  Can perform simple calculations? Yes  Displays appropriate judgment?Yes  Can read the correct time from a watch face?Yes   List the Names of Other Physician/Practitioners you currently use: Dr. Budd Palmer Allergy Specialist Cardiologist    Indicate any recent Medical Services you may have received from other than [facility] providers in the past year (date may be approximate).  See Above  Screening Tests / Date--See END OF NOTE Colonoscopy                     Zostavax  Mammogram  Influenza Vaccine  Tetanus/tdap    Assessment:    Annual wellness medicare exam   Plan:    During the course of the visit the patient was educated and counseled about appropriate screening and preventive services including:  Screening mammography  Colorectal cancer screening  Shingles vaccine. Prescription given to that she can get the vaccine at the pharmacy or Medicare part D.  Screen + for depression. PHQ- 9 score of 12 (moderate depression). We discussed the options of counseling versus possibly a medication. I encouraged her strongly think about the counseling. She is going through some medical problems currently and her husband is as well Mrs. been very stressful for her. She says she will think  about it. She does have Xanax to use as needed. Though she may benefit from an SSRI for her more depressive type symptoms but she wants to hold off at this time.  I aksed her to please have her cardioloist send records since we have none on file.  Diet review for nutrition referral? Yes ____ Not Indicated __x__  Patient Instructions (the written plan) was given to the patient.  Medicare Attestation  I have personally reviewed:  The patient's medical and social history  Their use of alcohol, tobacco or illicit drugs  Their current medications and supplements  The  patient's functional ability including ADLs,fall risks, home safety risks, cognitive, and hearing and visual impairment  Diet and physical activities  Evidence for depression or mood disorders  The patient's weight, height, BMI, and visual acuity have been recorded in the chart. I have made referrals, counseling, and provided education to the patient based on review of the above and I have provided the patient with a written personalized care plan for preventive services.       Chief Complaint: Physical (CPE)  HPI: 70 y.o. y/o female  here for CPE.    Review of Systems: Consitutional: No fever, chills, fatigue, night sweats, lymphadenopathy. No significant/unexplained weight changes. Eyes: No visual changes, eye redness, or discharge. ENT/Mouth: No ear pain, sore throat, nasal drainage, or sinus pain. Cardiovascular: No chest pressure,heaviness, tightness or squeezing, even with exertion. No increased shortness of breath or dyspnea on exertion.No palpitations, edema, orthopnea, PND. Respiratory: No cough, hemoptysis, SOB, or wheezing. Gastrointestinal: No anorexia, dysphagia, reflux, pain, nausea, vomiting, hematemesis, diarrhea, constipation, BRBPR, or melena. Breast: No mass, nodules, bulging, or retraction. No skin changes or inflammation. No nipple discharge. No lymphadenopathy. Genitourinary: No dysuria, hematuria, incontinence, vaginal discharge, pruritis, burning, abnormal bleeding, or pain. Musculoskeletal: No decreased ROM, No joint pain or swelling. No significant pain in neck, back, or extremities. Skin: No rash, pruritis, or concerning lesions. Neurological: No headache, dizziness, syncope, seizures, tremors, memory loss, coordination problems, or paresthesias. Psychological: No anxiety, depression, hallucinations, SI/HI. Endocrine: No polydipsia, polyphagia, polyuria, or known diabetes.No increased fatigue. No palpitations/rapid heart rate. No significant/unexplained weight  change. All other systems were reviewed and are otherwise negative.  Past Medical History  Diagnosis Date  . Hypertension   . Gout   . Hyperlipidemia   . 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 Anxiety   . Obesity   . Knee pain   . Positive TB test   . Hiatal hernia   . GERD (gastroesophageal reflux disease)   . CAD (coronary artery disease)     stent of distal RCA in 2006  . Diabetes mellitus without complication ([place])   . Venous insufficiency   . History of nuclear stress test 03/2011    negative lexiscan myoview; normal perfusion  . Diverticulosis      Past Surgical History  Procedure Laterality Date  . Abdominal hysterectomy    . Breast lumpectomy    . Back surgery  2012  . Coronary angioplasty with stent placement  06/29/2005    Taxus 2.5x70mm DES to distal RCA (Dr. Tami Ribas)  . Transthoracic echocardiogram  12/20/2012    EF 99991111, normal systolic function, mild hypokinesis of inf myocardium; calcified MV annulua, LA & RA mildly dilated    Home Meds:  Outpatient Prescriptions Prior to Visit  Medication Sig Dispense Refill  . ALPRAZolam (XANAX) 1 MG tablet TAKE 1 TABLET TWICE A DAY AS NEEDED 30 tablet 2  . aspirin 81 MG  tablet Take 81 mg by mouth daily.      [name] atorvastatin (LIPITOR) 80 MG tablet Take 1 tablet (80 mg total) by mouth daily. 90 tablet 1  . Azelastine HCl (ASTEPRO) 0.15 % SOLN Place 1-2 sprays into the nose daily.    . clopidogrel (PLAVIX) 75 MG tablet Take 1 tablet (75 mg total) by mouth daily. 90 tablet 1  . EPIPEN 2-PAK 0.3 MG/0.3ML SOAJ injection USE AS DIRECTED FOR SEVERE ALLERGIC REACTION  1  . fexofenadine (ALLEGRA) 180 MG tablet Take 1 tablet (180 mg total) by mouth daily. 30 tablet 5  . fish oil-omega-3 fatty acids 1000 MG capsule Take 1 g by mouth. Takes occasionally    . fluticasone (FLONASE) 50 MCG/ACT nasal spray Place 1 spray into both nostrils 2 (two) times daily. 48 g 5  . losartan (COZAAR) 50 MG tablet Take 1 tablet (50 mg total) by mouth daily. 90  tablet 3  . metoprolol (LOPRESSOR) 50 MG tablet TAKE 1 TABLET BY MOUTH IN THE MORNING AND 1&1/2 IN THE EVENING 90 tablet 5  . Misc Natural Products (GLUCOSAMINE CHOND COMPLEX/MSM PO) Take by mouth. Occasionally    . Multiple Vitamin (MULTIVITAMIN) tablet Take 1 tablet by mouth. occasionally    . nitroGLYCERIN (NITROSTAT) 0.4 MG SL tablet Place 1 tablet (0.4 mg total) under the tongue every 5 (five) minutes as needed for chest pain. 25 tablet 3  . pantoprazole (PROTONIX) 40 MG tablet TAKE 1 TABLET EVERY DAY 90 tablet 3  . triamterene-hydrochlorothiazide (MAXZIDE-25) 37.5-25 MG tablet take 1 tablet by mouth once daily 90 tablet 0  . zolpidem (AMBIEN) 10 MG tablet TAKE 1 TABLET AT BEDTIME AS NEEDED FOR SLEEP 30 tablet 2  . montelukast (SINGULAIR) 10 MG tablet TAKE 1 TABLET BY MOUTH AT BEDTIME (Patient not taking: Reported on 11/11/2015) 90 tablet 0  . Azelastine HCl 0.15 % SOLN Reported on 10/15/2015  5  . clopidogrel (PLAVIX) 75 MG tablet TAKE 1 TABLET EVERY DAY (Patient not taking: Reported on 09/14/2015) 30 tablet 11  . nystatin (MYCOSTATIN) 100000 UNIT/ML suspension Take 5 mLs (500,000 Units total) by mouth 4 (four) times daily. (Patient not taking: Reported on 10/15/2015) 60 mL 0  . nystatin (MYCOSTATIN) 100000 UNIT/ML suspension Take 5 mLs (500,000 Units total) by mouth 4 (four) times daily. (Patient not taking: Reported on 09/14/2015) 180 mL 1  . zolpidem (AMBIEN) 10 MG tablet Take 1 tablet (10 mg total) by mouth at bedtime as needed for sleep. 30 tablet 2  . zoster vaccine live, PF, (ZOSTAVAX) 60454 UNT/0.65ML injection Inject 19,400 Units into the skin once. (Patient not taking: Reported on 10/15/2015) 1 each 0   No facility-administered medications prior to visit.    Allergies:  Allergies  Allergen Reactions  . Ciprofloxacin   . Citalopram Hydrobromide   . Escitalopram Oxalate   . Paroxetine   . Polysporin [Bacitracin-Polymyxin B]     Social History   Social History  . Marital  Status: Single    Spouse Name: N/A  . Number of Children: 1  . 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 Smoking status: Former Smoker    Quit date: 12/20/2001  . Smokeless tobacco: Never Used  . Alcohol Use: No  . Drug Use: No  . Sexual Activity: Not on file   Other Topics Concern  . Not on file   Social History Narrative    Family History  Problem Relation Age of Onset  . Diabetes Sister   . Colon cancer Brother   . Colon cancer Paternal Uncle   . Ovarian cancer Mother   . Other Father     homicide  . Heart attack Brother     x2  . Hypertension Brother   . Hypertension Sister     Physical Exam: Blood pressure 126/76, pulse 68, temperature 98.1 F (36.7 C), temperature source Oral, resp. rate 18, height 5' 4.5" (1.638 m), weight 228 lb (103.42 kg)., Body mass index is 38.55 kg/(m^2). General: Obese AAF. Appears in no acute distress. HEENT: Normocephalic, atraumatic. Conjunctiva pink, sclera non-icteric. Pupils 2 mm constricting to 1 mm, round, regular, and equally reactive to light and accomodation. EOMI. Internal auditory canal clear. TMs with good cone of light and without pathology. Nasal mucosa pink. Nares are without discharge. No sinus tenderness. Oral mucosa pink.  Pharynx without exudate.   Neck: Supple. Trachea midline. No thyromegaly. Full ROM. No lymphadenopathy.No Carotid Bruits. Lungs: Clear to auscultation bilaterally without wheezes, rales, or rhonchi. Breathing is of normal effort and unlabored. Cardiovascular: RRR with S1 S2. No murmurs, rubs, or gallops. Distal pulses 2+ symmetrically. No carotid or abdominal bruits. Breast: Deferred. Sees Gyn --Dr. Denton Ar pt Abdomen: Soft, non-tender, non-distended with normoactive bowel sounds. No hepatosplenomegaly or masses. No rebound/guarding. No CVA tenderness. No hernias.  Genitourinary: Deferred. Sees Gyn--Dr. Lowe----per pt Musculoskeletal: Full range of  motion and 5/5 strength throughout. Without swelling, atrophy, tenderness, crepitus, or warmth. Extremities without clubbing, cyanosis, or edema.  Skin: Warm and moist without erythema, ecchymosis, wounds, or rash. Neuro: A+Ox3. CN II-XII grossly intact. Moves all extremities spontaneously. Full sensation throughout. Normal gait.  Psych:  Responds to questions appropriately with a normal affect.   Assessment/Plan:  70 y.o. y/o female here for CPE   1. Medicare annual wellness visit, subsequent See Documentation above.  2. Visit for preventive health examination  A. Screening Labs: She is fasting. Will check labs now   B. Pap: No Pap indicated. History of complete hysterectomy. No cancer.   Also,  she sees Dr. Corinna Capra, Gyn annually  C. Screening Mammogram:  10/08/2013---[facility] ---Negative                                              At [facility] 11/10/2015---says she is calling today to schedule f/u   D. DEXA/BMD:  She had DEXA 02/04/2015----Normal. Report says Repeat 5 years. --done at [place]. Colorectal Cancer Screening: Last colonoscopy was October 2011. I do not have the report. However patient states that she is to repeat 5 years which means this is due October 2016.          At [place] 11/11/2015---she says she has appt with GI scheduled for May 1st--for them to do OV then have f/u colonoscopy F. Glaucoma Screen:  At [place] 07/2014: "She says her last eye exam was about 2 years ago---showed no glaucoma, no diabetic retinopathy. Pt agreeable to schedule f/u Eye Exam." At [place] 10/2015---She reports she did have f/u Eye Exam--got new glasses. No glaucoma, no cataracts  G. Immunizations:  Influenza:  Received 07/02/2014 Tetanus:  Tdap Given Here 01/25/2012 Pneumococcal: Prevnar 13---Given here 08/18/2013                          Pneumovax 23---Given here 02/25/2014  Zostavax:  07/2014--" Pt says she did find out that Zostavax is covered with her Insurance. Even ad [place] send Rx to Pharmacy. However, has not  gone to pharmacy to get shot yet--told her to go today"                    10/2015: She reports she did get the Zostavax  Regular office visit 6 months or sooner if needed   Signed, Karis Juba, [place], [facility] 11/11/2015 9:23 AM

## 2015-11-12 LAB — VITAMIN D 25 HYDROXY (VIT D DEFICIENCY, FRACTURES): Vit D, 25-Hydroxy: 32 ng/mL (ref 30–100)

## 2015-11-15 ENCOUNTER — Ambulatory Visit (INDEPENDENT_AMBULATORY_CARE_PROVIDER_SITE_OTHER): Payer: Medicare Other

## 2015-11-15 DIAGNOSIS — J309 Allergic rhinitis, unspecified: Secondary | ICD-10-CM

## 2015-11-15 DIAGNOSIS — Z803 Family history of malignant neoplasm of breast: Secondary | ICD-10-CM | POA: Diagnosis not present

## 2015-11-15 DIAGNOSIS — Z1231 Encounter for screening mammogram for malignant neoplasm of breast: Secondary | ICD-10-CM | POA: Diagnosis not present

## 2015-11-15 LAB — HM MAMMOGRAPHY: HM Mammogram: NEGATIVE

## 2015-11-16 ENCOUNTER — Encounter: Payer: Self-pay | Admitting: Family Medicine

## 2015-11-17 ENCOUNTER — Ambulatory Visit (INDEPENDENT_AMBULATORY_CARE_PROVIDER_SITE_OTHER): Payer: Medicare Other | Admitting: Allergy and Immunology

## 2015-11-17 ENCOUNTER — Encounter: Payer: Self-pay | Admitting: Allergy and Immunology

## 2015-11-17 ENCOUNTER — Encounter: Payer: Self-pay | Admitting: Family Medicine

## 2015-11-17 VITALS — BP 130/80 | HR 60 | Resp 16

## 2015-11-17 DIAGNOSIS — J309 Allergic rhinitis, unspecified: Secondary | ICD-10-CM

## 2015-11-17 DIAGNOSIS — J302 Other seasonal allergic rhinitis: Secondary | ICD-10-CM

## 2015-11-17 DIAGNOSIS — J3089 Other allergic rhinitis: Secondary | ICD-10-CM

## 2015-11-17 DIAGNOSIS — H1010 Acute atopic conjunctivitis, unspecified eye: Secondary | ICD-10-CM | POA: Diagnosis not present

## 2015-11-17 DIAGNOSIS — T783XXD Angioneurotic edema, subsequent encounter: Secondary | ICD-10-CM | POA: Diagnosis not present

## 2015-11-17 DIAGNOSIS — T783XXA Angioneurotic edema, initial encounter: Secondary | ICD-10-CM | POA: Insufficient documentation

## 2015-11-17 NOTE — Progress Notes (Signed)
Follow-up Note  RE: Madeline Wood MRN: [id number] DOB: 01/24/1946 Date of Office Visit: 11/17/2015  Primary care provider: Karis Juba, PA-C Referring provider: Orlena Sheldon, PA-C  History of present illness: HPI Comments: Madeline Wood is a 70 y.o. female who presents today for follow up and reevaluation of angioedema and allergic rhinitis.  On 10/14/2015 she had an isolated episode of lower lip angioedema.  She did not experience concomitant cardiopulmonary or GI symptoms.  She did not have associated urticaria.  She was unable to identify any specific triggers, however the meal prior to the onset of symptoms contained peanuts, tree nuts, and a cookie.  She saw Dr. Levin Erp the next day and was given prednisone.  The angioedema resolved over the next 24-48 hours.  In addition, she was instructed to avoid beef, peanuts, and tree nuts.  She has avoided those foods and has not had recurrence of angioedema, or any other concerning symptoms, over the past month.  Her nasal symptoms have improved significantly while on aeroallergen immunotherapy and she has been able to decrease medication use.   Assessment and plan: Angioedema Isolated episode of angioedema approximately 5 weeks ago without recurrence.  The patient is scheduled to return next week for allergy skin testing after having been off of antihistamines for at least 3 days.  If skin tests are negative/unrevealing, we will proceed with screening lab work.  For now, continue avoidance of beef, peanuts, and tree nuts until these allergies have been ruled out.  Allergic rhinoconjunctivitis, seasonal and perennial Well-controlled.  Continue appropriate allergen avoidance measures, aeroallergen immunotherapy as prescribed and as tolerated, and fluticasone nasal spray as needed.   Diagnositics: We were unable to perform skin tests today due to recent administration of antihistamine.     Physical examination: Blood pressure  130/80, pulse 60, resp. rate 16.  General: Alert, interactive, in no acute distress. HEENT: TMs pearly gray, turbinates mildly edematous without discharge, post-pharynx non erythematous. Neck: Supple without lymphadenopathy. Lungs: Clear to auscultation without wheezing, rhonchi or rales. CV: Normal S1, S2 without murmurs. Skin: Warm and dry, without lesions or rashes.  The following portions of the patient's history were reviewed and updated as appropriate: allergies, current medications, past family history, past medical history, past social history, past surgical history and problem list.    Medication List       This list is accurate as of: 11/17/15  1:05 PM.  Always use your most recent med list.               ALPRAZolam 1 MG tablet  Commonly known as:  XANAX  Take 1 tablet (1 mg total) by mouth 2 (two) times daily as needed.     aspirin 81 MG tablet  Take 81 mg by mouth daily.     ASTEPRO 0.15 % Soln  Generic drug:  Azelastine HCl  Place 1-2 sprays into the nose daily.     atorvastatin 80 MG tablet  Commonly known as:  LIPITOR  Take 1 tablet (80 mg total) by mouth daily.     clopidogrel 75 MG tablet  Commonly known as:  PLAVIX  Take 1 tablet (75 mg total) by mouth daily.     EPIPEN 2-PAK 0.3 mg/0.3 mL Soaj injection  Generic drug:  EPINEPHrine  USE AS DIRECTED FOR SEVERE ALLERGIC REACTION     fexofenadine 180 MG tablet  Commonly known as:  ALLEGRA  Take 1 tablet (180 mg total) by mouth daily.  fish oil-omega-3 fatty acids 1000 MG capsule  Take 1 g by mouth. Takes occasionally     fluticasone 50 MCG/ACT nasal spray  Commonly known as:  FLONASE  Place 1 spray into both nostrils 2 (two) times daily.     GLUCOSAMINE CHOND COMPLEX/MSM PO  Take by mouth. Occasionally     losartan 50 MG tablet  Commonly known as:  COZAAR  Take 1 tablet (50 mg total) by mouth daily.     metoprolol 50 MG tablet  Commonly known as:  LOPRESSOR  TAKE 1 TABLET BY MOUTH IN  THE MORNING AND 1&1/2 IN THE EVENING     montelukast 10 MG tablet  Commonly known as:  SINGULAIR  TAKE 1 TABLET BY MOUTH AT BEDTIME     multivitamin tablet  Take 1 tablet by mouth. occasionally     nitroGLYCERIN 0.4 MG SL tablet  Commonly known as:  NITROSTAT  Place 1 tablet (0.4 mg total) under the tongue every 5 (five) minutes as needed for chest pain.     pantoprazole 40 MG tablet  Commonly known as:  PROTONIX  TAKE 1 TABLET EVERY DAY     triamterene-hydrochlorothiazide 37.5-25 MG tablet  Commonly known as:  MAXZIDE-25  take 1 tablet by mouth once daily     zolpidem 10 MG tablet  Commonly known as:  AMBIEN  TAKE 1 TABLET AT BEDTIME AS NEEDED FOR SLEEP        Allergies  Allergen Reactions  . Ciprofloxacin   . Citalopram Hydrobromide   . Escitalopram Oxalate   . Paroxetine   . Polysporin [Bacitracin-Polymyxin B]     I appreciate the opportunity to take part in this Madeline Wood's care. Please do not hesitate to contact me with questions.  Sincerely,   R. Edgar Frisk, MD

## 2015-11-17 NOTE — Patient Instructions (Signed)
Angioedema Isolated episode of angioedema approximately 5 weeks ago without recurrence.  The patient is scheduled to return next week for allergy skin testing after having been off of antihistamines for at least 3 days.  If skin tests are negative/unrevealing, we will proceed with screening lab work.  For now, continue avoidance of beef, peanuts, and tree nuts until these allergies have been ruled out.  Allergic rhinoconjunctivitis, seasonal and perennial Well-controlled.  Continue appropriate allergen avoidance measures, aeroallergen immunotherapy as prescribed and as tolerated, and fluticasone nasal spray as needed.   Return in about 1 week (around 11/24/2015) for food allergen skin testing.

## 2015-11-17 NOTE — Assessment & Plan Note (Signed)
Well-controlled.  Continue appropriate allergen avoidance measures, aeroallergen immunotherapy as prescribed and as tolerated, and fluticasone nasal spray as needed.

## 2015-11-17 NOTE — Assessment & Plan Note (Signed)
Isolated episode of angioedema approximately 5 weeks ago without recurrence.  The patient is scheduled to return next week for allergy skin testing after having been off of antihistamines for at least 3 days.  If skin tests are negative/unrevealing, we will proceed with screening lab work.  For now, continue avoidance of beef, peanuts, and tree nuts until these allergies have been ruled out.

## 2015-12-01 ENCOUNTER — Ambulatory Visit (INDEPENDENT_AMBULATORY_CARE_PROVIDER_SITE_OTHER): Payer: Medicare Other | Admitting: Allergy and Immunology

## 2015-12-01 ENCOUNTER — Encounter: Payer: Self-pay | Admitting: Allergy and Immunology

## 2015-12-01 VITALS — BP 130/80 | HR 70 | Resp 17

## 2015-12-01 DIAGNOSIS — J3089 Other allergic rhinitis: Secondary | ICD-10-CM

## 2015-12-01 DIAGNOSIS — H1010 Acute atopic conjunctivitis, unspecified eye: Secondary | ICD-10-CM

## 2015-12-01 DIAGNOSIS — J309 Allergic rhinitis, unspecified: Secondary | ICD-10-CM

## 2015-12-01 DIAGNOSIS — J302 Other seasonal allergic rhinitis: Secondary | ICD-10-CM

## 2015-12-01 DIAGNOSIS — T783XXD Angioneurotic edema, subsequent encounter: Secondary | ICD-10-CM | POA: Diagnosis not present

## 2015-12-01 DIAGNOSIS — E069 Thyroiditis, unspecified: Secondary | ICD-10-CM | POA: Diagnosis not present

## 2015-12-01 NOTE — Progress Notes (Signed)
Follow-up Note  RE: LUCYNDA KIL MRN: [id number] DOB: 20-Jun-1946 Date of Office Visit: 12/01/2015  Primary care provider: Karis Juba, PA-C Referring provider: Orlena Sheldon, PA-C  History of present illness: HPI Comments: Madeline Wood is a 70 y.o. female who presents today for follow up and skin testing regarding recent angioedema.As you recall, on 10/14/2015 she had an isolated episode of lower lip angioedema. She did not experience concomitant cardiopulmonary or GI symptoms. She did not have associated urticaria. She was unable to identify any specific triggers, however the meal prior to the onset of symptoms contained peanuts, tree nuts, and a cookie. We were unable to proceed with allergy skin testing during her previous visit due to recent administration of antihistamine.  She has been off of all antihistamines over the past 3 days in anticipation of today's testing.  She has no nasal symptom complaints today.   Assessment and plan: Angioedema Angioedema. There is no obvious etiology identified. Food allergen skin tests were negative today despite a positive histamine control.  The patient is not taking an ACE inhibitor. NSAIDs may exacerbate angioedema but in this case not the underlying etiology as she takes ASA on a daily basis without recurrent symptoms. There are no concomitant symptoms concerning for anaphylaxis or constitutional symptoms worrisome for an underlying malignancy. We will order labs to rule out potential etiologies.   The following labs have been ordered: C4, FCeRI antibody, tryptase, TSH, antithyroglobulin antibody, thyroid peroxidase antibody, as well as serum specific IgE against peanut, peanut components, tree nut panel, and galactose-alpha-1,3-galactose.  The patient will be notified with further recommendations and follow up instructions after lab results have returned.  A journal is to be kept recording any foods eaten, beverages consumed,  medications taken within a 6 hour period prior to the onset of symptoms, as well as record activities being performed, and environmental conditions. For any symptoms concerning for anaphylaxis, 911 is to be called immediately.  Allergic rhinoconjunctivitis, seasonal and perennial Stable.  Continue appropriate allergen avoidance measures, aeroallergen immunotherapy as prescribed and as tolerated, and fluticasone nasal spray as needed.    Diagnositics: Select food allergen skin tests: Negative despite a positive histamine control.    Physical examination: Blood pressure 130/80, pulse 70, resp. rate 17.  General: Alert, interactive, in no acute distress. HEENT: TMs pearly gray, turbinates mildly edematous without discharge, post-pharynx mildly erythematous. Neck: Supple without lymphadenopathy. Lungs: Clear to auscultation without wheezing, rhonchi or rales. CV: Normal S1, S2 without murmurs. Skin: Warm and dry, without lesions or rashes.  The following portions of the patient's history were reviewed and updated as appropriate: allergies, current medications, past family history, past medical history, past social history, past surgical history and problem list.    Medication List       This list is accurate as of: 12/01/15  1:31 PM.  Always use your most recent med list.               ALPRAZolam 1 MG tablet  Commonly known as:  XANAX  Take 1 tablet (1 mg total) by mouth 2 (two) times daily as needed.     aspirin 81 MG tablet  Take 81 mg by mouth daily.     ASTEPRO 0.15 % Soln  Generic drug:  Azelastine HCl  Place 1-2 sprays into the nose daily.     atorvastatin 80 MG tablet  Commonly known as:  LIPITOR  Take 1 tablet (80 mg total) by mouth daily.  clopidogrel 75 MG tablet  Commonly known as:  PLAVIX  Take 1 tablet (75 mg total) by mouth daily.     EPIPEN 2-PAK 0.3 mg/0.3 mL Soaj injection  Generic drug:  EPINEPHrine  USE AS DIRECTED FOR SEVERE ALLERGIC REACTION       fexofenadine 180 MG tablet  Commonly known as:  ALLEGRA  Take 1 tablet (180 mg total) by mouth daily.     fish oil-omega-3 fatty acids 1000 MG capsule  Take 1 g by mouth. Takes occasionally     fluticasone 50 MCG/ACT nasal spray  Commonly known as:  FLONASE  Place 1 spray into both nostrils 2 (two) times daily.     GLUCOSAMINE CHOND COMPLEX/MSM PO  Take by mouth. Occasionally     losartan 50 MG tablet  Commonly known as:  COZAAR  Take 1 tablet (50 mg total) by mouth daily.     metoprolol 50 MG tablet  Commonly known as:  LOPRESSOR  TAKE 1 TABLET BY MOUTH IN THE MORNING AND 1&1/2 IN THE EVENING     montelukast 10 MG tablet  Commonly known as:  SINGULAIR  TAKE 1 TABLET BY MOUTH AT BEDTIME     multivitamin tablet  Take 1 tablet by mouth. occasionally     nitroGLYCERIN 0.4 MG SL tablet  Commonly known as:  NITROSTAT  Place 1 tablet (0.4 mg total) under the tongue every 5 (five) minutes as needed for chest pain.     pantoprazole 40 MG tablet  Commonly known as:  PROTONIX  TAKE 1 TABLET EVERY DAY     triamterene-hydrochlorothiazide 37.5-25 MG tablet  Commonly known as:  MAXZIDE-25  take 1 tablet by mouth once daily     zolpidem 10 MG tablet  Commonly known as:  AMBIEN  TAKE 1 TABLET AT BEDTIME AS NEEDED FOR SLEEP        Allergies  Allergen Reactions  . Ciprofloxacin   . Citalopram Hydrobromide   . Escitalopram Oxalate   . Paroxetine   . Polysporin [Bacitracin-Polymyxin B]     I appreciate the opportunity to take part in this Breeanna's care. Please do not hesitate to contact me with questions.  Sincerely,   R. Edgar Frisk, MD

## 2015-12-01 NOTE — Patient Instructions (Addendum)
Angioedema Angioedema. There is no obvious etiology identified. Food allergen skin tests were negative today despite a positive histamine control.  The patient is not taking an ACE inhibitor. NSAIDs may exacerbate angioedema but in this case not the underlying etiology as she takes ASA on a daily basis without recurrent symptoms. There are no concomitant symptoms concerning for anaphylaxis or constitutional symptoms worrisome for an underlying malignancy. We will order labs to rule out potential etiologies.   The following labs have been ordered: C4, FCeRI antibody, tryptase, TSH, antithyroglobulin antibody, thyroid peroxidase antibody, as well as serum specific IgE against peanut, peanut components, tree nut panel, and galactose-alpha-1,3-galactose.  The patient will be notified with further recommendations and follow up instructions after lab results have returned.  A journal is to be kept recording any foods eaten, beverages consumed, medications taken within a 6 hour period prior to the onset of symptoms, as well as record activities being performed, and environmental conditions. For any symptoms concerning for anaphylaxis, 911 is to be called immediately.  Allergic rhinoconjunctivitis, seasonal and perennial Stable.  Continue appropriate allergen avoidance measures, aeroallergen immunotherapy as prescribed and as tolerated, and fluticasone nasal spray as needed.    Return When lab results have returned the patient will be called with further recommendations and follow up.

## 2015-12-01 NOTE — Assessment & Plan Note (Signed)
Stable.  Continue appropriate allergen avoidance measures, aeroallergen immunotherapy as prescribed and as tolerated, and fluticasone nasal spray as needed.

## 2015-12-01 NOTE — Assessment & Plan Note (Signed)
Angioedema. There is no obvious etiology identified. Food allergen skin tests were negative today despite a positive histamine control.  The patient is not taking an ACE inhibitor. NSAIDs may exacerbate angioedema but in this case not the underlying etiology as she takes ASA on a daily basis without recurrent symptoms. There are no concomitant symptoms concerning for anaphylaxis or constitutional symptoms worrisome for an underlying malignancy. We will order labs to rule out potential etiologies.   The following labs have been ordered: C4, FCeRI antibody, tryptase, TSH, antithyroglobulin antibody, thyroid peroxidase antibody, as well as serum specific IgE against peanut, peanut components, tree nut panel, and galactose-alpha-1,3-galactose.  The patient will be notified with further recommendations and follow up instructions after lab results have returned.  A journal is to be kept recording any foods eaten, beverages consumed, medications taken within a 6 hour period prior to the onset of symptoms, as well as record activities being performed, and environmental conditions. For any symptoms concerning for anaphylaxis, 911 is to be called immediately.

## 2015-12-02 LAB — ALLERGY PANEL 18, NUT MIX GROUP
Almonds: <0.1 kU/L
Cashew IgE: <0.1 kU/L
Coconut: <0.1 kU/L
Hazelnut: <0.1 kU/L
Peanut IgE: <0.1 kU/L
Pecan Nut: <0.1 kU/L
Sesame Seed f10: <0.1 kU/L

## 2015-12-02 LAB — C4 COMPLEMENT: C4 Complement: 35 mg/dL (ref 10–40)

## 2015-12-02 LAB — ALLERGEN WALNUT F256: Walnut: <0.1 kU/L

## 2015-12-02 LAB — ALLERGEN, BRAZIL NUT, F18: Brazil Nut: <0.1 kU/L

## 2015-12-02 LAB — ALLERGEN PINE NUT F253: Pine Nut f253: <0.1 kU/L

## 2015-12-02 LAB — ALLERGEN PISTACHIO F203: Pistachio  IgE: <0.1 kU/L

## 2015-12-03 LAB — TRYPTASE: Tryptase: 2.9 ug/L (ref <11)

## 2015-12-04 LAB — GALACTOSE-ALPHA-1,3-GALACTOSE IGE: Galactose-alpha-1,3-galactose IgE: <0.1 kU/L (ref <0.35)

## 2015-12-06 ENCOUNTER — Ambulatory Visit (INDEPENDENT_AMBULATORY_CARE_PROVIDER_SITE_OTHER): Payer: Medicare Other

## 2015-12-06 DIAGNOSIS — J309 Allergic rhinitis, unspecified: Secondary | ICD-10-CM

## 2015-12-08 LAB — CP CHRONIC URTICARIA INDEX PANEL
Histamine Release: <16 % (ref <16)
TSH: 1.58 mIU/L
Thyroglobulin Ab: <1 IU/mL (ref <2)
Thyroperoxidase Ab SerPl-aCnc: <1 IU/mL (ref <9)

## 2015-12-20 ENCOUNTER — Ambulatory Visit (INDEPENDENT_AMBULATORY_CARE_PROVIDER_SITE_OTHER): Payer: Medicare Other

## 2015-12-20 DIAGNOSIS — J309 Allergic rhinitis, unspecified: Secondary | ICD-10-CM

## 2015-12-27 ENCOUNTER — Encounter: Payer: Self-pay | Admitting: Gastroenterology

## 2015-12-27 ENCOUNTER — Ambulatory Visit (INDEPENDENT_AMBULATORY_CARE_PROVIDER_SITE_OTHER): Payer: Medicare Other | Admitting: Gastroenterology

## 2015-12-27 VITALS — BP 122/70 | HR 68 | Ht 64.5 in | Wt 228.0 lb

## 2015-12-27 DIAGNOSIS — I2510 Atherosclerotic heart disease of native coronary artery without angina pectoris: Secondary | ICD-10-CM

## 2015-12-27 DIAGNOSIS — Z1211 Encounter for screening for malignant neoplasm of colon: Secondary | ICD-10-CM

## 2015-12-27 NOTE — Patient Instructions (Signed)
Your physician has requested that you go to the basement for the following lab work before leaving today:[facility]ob.  Thank you for choosing me and [facility].  Pricilla Riffle. Dagoberto Ligas., MD., Marval Regal

## 2015-12-27 NOTE — Progress Notes (Signed)
    History of Present Illness: This is a 70 year old female here for the evaluation colon cancer screening. 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 Previous records had indicated she had a brother with colon cancer at age 74 and a paternal uncle with colon cancer on further evaluating this history the patient feels this was an error and we carefully detailed her family history and she feels certain that she has no first or second-degree relatives with colon cancer. She does note a brother with a history of pancreatic cancer and a brother with history of prostate cancer. Denies weight loss, abdominal pain, constipation, diarrhea, change in stool caliber, melena, hematochezia, nausea, vomiting, dysphagia, reflux symptoms, chest pain.  ENDOSCOPIC IMPRESSION from colonoscopy in 06/2010:  1) Mild diverticulosis  2) Mild melanosis throughout the colon  3) Internal hemorrhoids  4) Cecal AVM  Review of Systems: Pertinent positive and negative review of systems were noted in the above HPI section. All other review of systems were otherwise negative.  Current Medications, Allergies, Past Medical History, Past Surgical History, Family History and Social History were reviewed in [employer] record.  Physical Exam: General: Well developed, well nourished, no acute distress Head: Normocephalic and atraumatic Eyes:  sclerae anicteric, EOMI Ears: Normal auditory acuity Mouth: No deformity or lesions Neck: Supple, no masses or thyromegaly Lungs: Clear throughout to auscultation Heart: Regular rate and rhythm; no murmurs, rubs or bruits Abdomen: Soft, non tender and non distended. No masses, hepatosplenomegaly or hernias noted. Normal Bowel sounds Rectal: deferred to colonoscopy Musculoskeletal: Symmetrical with no gross deformities  Skin: No lesions on visible extremities Pulses:  Normal pulses noted Extremities: No clubbing, cyanosis, edema or deformities  noted Neurological: Alert oriented x 4, grossly nonfocal Cervical Nodes:  No significant cervical adenopathy Inguinal Nodes: No significant inguinal adenopathy Psychological:  Alert and cooperative. Normal mood and affect  Assessment and Recommendations:  1. CRC screening, average risk. Previous history that indicated a family history of colon cancer was in error by patient's report. Stool FIT. If positive proceed with colonoscopy if negative continue annual FIT testing with a routine surveillance colonoscopy recommended in November 2021.  2. CAD with a DES. Maintained on Plavix.

## 2015-12-30 ENCOUNTER — Ambulatory Visit (INDEPENDENT_AMBULATORY_CARE_PROVIDER_SITE_OTHER): Payer: Medicare Other

## 2015-12-30 DIAGNOSIS — J309 Allergic rhinitis, unspecified: Secondary | ICD-10-CM | POA: Diagnosis not present

## 2016-01-05 ENCOUNTER — Encounter: Payer: Self-pay | Admitting: Allergy and Immunology

## 2016-01-05 ENCOUNTER — Ambulatory Visit (INDEPENDENT_AMBULATORY_CARE_PROVIDER_SITE_OTHER): Payer: Medicare Other | Admitting: Allergy and Immunology

## 2016-01-05 VITALS — BP 120/75 | HR 70 | Resp 16

## 2016-01-05 DIAGNOSIS — J302 Other seasonal allergic rhinitis: Secondary | ICD-10-CM

## 2016-01-05 DIAGNOSIS — H1010 Acute atopic conjunctivitis, unspecified eye: Secondary | ICD-10-CM

## 2016-01-05 DIAGNOSIS — J309 Allergic rhinitis, unspecified: Secondary | ICD-10-CM

## 2016-01-05 DIAGNOSIS — T783XXD Angioneurotic edema, subsequent encounter: Secondary | ICD-10-CM | POA: Diagnosis not present

## 2016-01-05 DIAGNOSIS — J3089 Other allergic rhinitis: Secondary | ICD-10-CM

## 2016-01-05 DIAGNOSIS — Z91018 Allergy to other foods: Secondary | ICD-10-CM

## 2016-01-05 NOTE — Patient Instructions (Signed)
Angioedema Idiopathic angioedema. The patient had negative in vivo and in vitro tests to peanut and was able to tolerate the graded peanut oral challenge today without adverse signs or symptoms. Therefore, she has the same risk of systemic reaction associated with the consumption of peanut as the general population.  Should significant symptoms recur or new symptoms occur, a journal is to be kept recording any foods eaten, beverages consumed, medications taken, activities performed, and environmental conditions within a 6 hour time period prior to the onset of symptoms. For any symptoms concerning for anaphylaxis, 911 is to be called immediately.   Allergic rhinoconjunctivitis, seasonal and perennial  Continue appropriate allergen avoidance measures, aeroallergen immunotherapy as prescribed and as tolerated, and fluticasone nasal spray as needed.   Return in about 4 months (around 05/07/2016), or if symptoms worsen or fail to improve.

## 2016-01-05 NOTE — Progress Notes (Signed)
Follow-up Note  RE: Madeline Wood MRN: [id number] DOB: [birth year] Date of Office Visit: 01/05/2016  Primary care provider: Karis Juba, PA-C Referring provider: Orlena Sheldon, PA-C  History of present illness: HPI Comments: Madeline Wood is a 70 y.o. female with allergic reactions versus idiopathic angioedema presents today for open graded oral challenge to peanut.  She has had no episodes of lip swelling in the interval since her previous visit.  She has no nasal symptom complaints today.  Assessment and plan: Angioedema Idiopathic angioedema. The patient had negative in vivo and in vitro tests to peanut and was able to tolerate the graded peanut oral challenge today without adverse signs or symptoms. Therefore, she has the same risk of systemic reaction associated with the consumption of peanut as the general population.  Should significant symptoms recur or new symptoms occur, a journal is to be kept recording any foods eaten, beverages consumed, medications taken, activities performed, and environmental conditions within a 6 hour time period prior to the onset of symptoms. For any symptoms concerning for anaphylaxis, 911 is to be called immediately.   Allergic rhinoconjunctivitis, seasonal and perennial  Continue appropriate allergen avoidance measures, aeroallergen immunotherapy as prescribed and as tolerated, and fluticasone nasal spray as needed.   Diagnositics: Open graded peanut oral challenge: The patient was able to tolerate the challenge today without adverse signs or symptoms. Vital signs were stable throughout the challenge and observation period.      Physical examination: Blood pressure 120/75, pulse 70, resp. rate 16.  General: Alert, interactive, in no acute distress. HEENT: TMs pearly gray, turbinates mildly edematous without discharge, post-pharynx mildly erythematous. Neck: Supple without lymphadenopathy. Lungs: Clear to auscultation without wheezing,  rhonchi or rales. CV: Normal S1, S2 without murmurs. Skin: Warm and dry, without lesions or rashes.  The following portions of the patient's history were reviewed and updated as appropriate: allergies, current medications, past family history, past medical history, past social history, past surgical history and problem list.    Medication List       This list is accurate as of: 01/05/16  8:02 PM.  Always use your most recent med list.               ALPRAZolam 1 MG tablet  Commonly known as:  XANAX  Take 1 tablet (1 mg total) by mouth 2 (two) times daily as needed.     aspirin 81 MG tablet  Take 81 mg by mouth daily.     atorvastatin 80 MG tablet  Commonly known as:  LIPITOR  Take 1 tablet (80 mg total) by mouth daily.     clopidogrel 75 MG tablet  Commonly known as:  PLAVIX  Take 1 tablet (75 mg total) by mouth daily.     EPIPEN 2-PAK 0.3 mg/0.3 mL Soaj injection  Generic drug:  EPINEPHrine  USE AS DIRECTED FOR SEVERE ALLERGIC REACTION     fexofenadine 180 MG tablet  Commonly known as:  ALLEGRA  Take 1 tablet (180 mg total) by mouth daily.     fish oil-omega-3 fatty acids 1000 MG capsule  Take 1 g by mouth. Takes occasionally     fluticasone 50 MCG/ACT nasal spray  Commonly known as:  FLONASE  Place 1 spray into both nostrils 2 (two) times daily.     GLUCOSAMINE CHOND COMPLEX/MSM PO  Take by mouth. Occasionally     losartan 50 MG tablet  Commonly known as:  COZAAR  Take 1 tablet (50 mg  total) by mouth daily.     metoprolol 50 MG tablet  Commonly known as:  LOPRESSOR  TAKE 1 TABLET BY MOUTH IN THE MORNING AND 1&1/2 IN THE EVENING     multivitamin tablet  Take 1 tablet by mouth. occasionally     nitroGLYCERIN 0.4 MG SL tablet  Commonly known as:  NITROSTAT  Place 1 tablet (0.4 mg total) under the tongue every 5 (five) minutes as needed for chest pain.     pantoprazole 40 MG tablet  Commonly known as:  PROTONIX  TAKE 1 TABLET EVERY DAY      triamterene-hydrochlorothiazide 37.5-25 MG tablet  Commonly known as:  MAXZIDE-25  take 1 tablet by mouth once daily     zolpidem 10 MG tablet  Commonly known as:  AMBIEN  TAKE 1 TABLET AT BEDTIME AS NEEDED FOR SLEEP        Allergies  Allergen Reactions  . Ciprofloxacin   . Citalopram Hydrobromide   . Escitalopram Oxalate   . Paroxetine   . Polysporin [Bacitracin-Polymyxin B]     I appreciate the opportunity to take part in this Brindy's care. Please do not hesitate to contact me with questions.  Sincerely,   R. Edgar Frisk, MD

## 2016-01-05 NOTE — Assessment & Plan Note (Signed)
Idiopathic angioedema. The patient had negative in vivo and in vitro tests to peanut and was able to tolerate the graded peanut oral challenge today without adverse signs or symptoms. Therefore, she has the same risk of systemic reaction associated with the consumption of peanut as the general population.  Should significant symptoms recur or new symptoms occur, a journal is to be kept recording any foods eaten, beverages consumed, medications taken, activities performed, and environmental conditions within a 6 hour time period prior to the onset of symptoms. For any symptoms concerning for anaphylaxis, 911 is to be called immediately.

## 2016-01-05 NOTE — Assessment & Plan Note (Signed)
   Continue appropriate allergen avoidance measures, aeroallergen immunotherapy as prescribed and as tolerated, and fluticasone nasal spray as needed.

## 2016-01-06 ENCOUNTER — Other Ambulatory Visit (INDEPENDENT_AMBULATORY_CARE_PROVIDER_SITE_OTHER): Payer: Medicare Other

## 2016-01-06 DIAGNOSIS — Z1211 Encounter for screening for malignant neoplasm of colon: Secondary | ICD-10-CM | POA: Diagnosis not present

## 2016-01-06 DIAGNOSIS — I2510 Atherosclerotic heart disease of native coronary artery without angina pectoris: Secondary | ICD-10-CM | POA: Diagnosis not present

## 2016-01-06 LAB — FECAL OCCULT BLOOD, IMMUNOCHEMICAL: Fecal Occult Bld: NEGATIVE

## 2016-01-10 ENCOUNTER — Ambulatory Visit (INDEPENDENT_AMBULATORY_CARE_PROVIDER_SITE_OTHER): Payer: Medicare Other | Admitting: *Deleted

## 2016-01-10 DIAGNOSIS — J309 Allergic rhinitis, unspecified: Secondary | ICD-10-CM | POA: Diagnosis not present

## 2016-01-12 ENCOUNTER — Other Ambulatory Visit: Payer: Self-pay | Admitting: Family Medicine

## 2016-01-13 NOTE — Telephone Encounter (Signed)
Refill appropriate and filled per protocol. 

## 2016-01-20 ENCOUNTER — Ambulatory Visit (INDEPENDENT_AMBULATORY_CARE_PROVIDER_SITE_OTHER): Payer: Medicare Other

## 2016-01-20 DIAGNOSIS — J309 Allergic rhinitis, unspecified: Secondary | ICD-10-CM | POA: Diagnosis not present

## 2016-01-26 ENCOUNTER — Ambulatory Visit (INDEPENDENT_AMBULATORY_CARE_PROVIDER_SITE_OTHER): Payer: Medicare Other

## 2016-01-26 DIAGNOSIS — J309 Allergic rhinitis, unspecified: Secondary | ICD-10-CM

## 2016-02-10 ENCOUNTER — Ambulatory Visit (INDEPENDENT_AMBULATORY_CARE_PROVIDER_SITE_OTHER): Payer: Medicare Other

## 2016-02-10 DIAGNOSIS — J309 Allergic rhinitis, unspecified: Secondary | ICD-10-CM

## 2016-03-02 ENCOUNTER — Ambulatory Visit (INDEPENDENT_AMBULATORY_CARE_PROVIDER_SITE_OTHER): Payer: Medicare Other

## 2016-03-02 DIAGNOSIS — J309 Allergic rhinitis, unspecified: Secondary | ICD-10-CM | POA: Diagnosis not present

## 2016-03-07 DIAGNOSIS — J3089 Other allergic rhinitis: Secondary | ICD-10-CM

## 2016-03-08 DIAGNOSIS — J301 Allergic rhinitis due to pollen: Secondary | ICD-10-CM | POA: Diagnosis not present

## 2016-03-15 ENCOUNTER — Ambulatory Visit (INDEPENDENT_AMBULATORY_CARE_PROVIDER_SITE_OTHER): Payer: Medicare Other

## 2016-03-15 DIAGNOSIS — J019 Acute sinusitis, unspecified: Secondary | ICD-10-CM | POA: Diagnosis not present

## 2016-03-16 DIAGNOSIS — M1712 Unilateral primary osteoarthritis, left knee: Secondary | ICD-10-CM | POA: Diagnosis not present

## 2016-03-16 DIAGNOSIS — M1711 Unilateral primary osteoarthritis, right knee: Secondary | ICD-10-CM | POA: Diagnosis not present

## 2016-03-22 ENCOUNTER — Ambulatory Visit (INDEPENDENT_AMBULATORY_CARE_PROVIDER_SITE_OTHER): Payer: Medicare Other

## 2016-03-22 DIAGNOSIS — J309 Allergic rhinitis, unspecified: Secondary | ICD-10-CM

## 2016-03-29 ENCOUNTER — Other Ambulatory Visit: Payer: Self-pay | Admitting: Physician Assistant

## 2016-03-29 NOTE — Telephone Encounter (Signed)
okay

## 2016-03-29 NOTE — Telephone Encounter (Signed)
LRF 11/14/15 #60  + 2   LOV 11/11/15  OK refill?

## 2016-03-30 NOTE — Telephone Encounter (Signed)
RX called in .

## 2016-04-04 ENCOUNTER — Ambulatory Visit (INDEPENDENT_AMBULATORY_CARE_PROVIDER_SITE_OTHER): Payer: Medicare Other | Admitting: *Deleted

## 2016-04-04 DIAGNOSIS — J309 Allergic rhinitis, unspecified: Secondary | ICD-10-CM

## 2016-04-26 ENCOUNTER — Ambulatory Visit (INDEPENDENT_AMBULATORY_CARE_PROVIDER_SITE_OTHER): Payer: Medicare Other | Admitting: *Deleted

## 2016-04-26 DIAGNOSIS — J309 Allergic rhinitis, unspecified: Secondary | ICD-10-CM | POA: Diagnosis not present

## 2016-04-30 ENCOUNTER — Other Ambulatory Visit: Payer: Self-pay | Admitting: Allergy and Immunology

## 2016-05-02 DIAGNOSIS — M25562 Pain in left knee: Secondary | ICD-10-CM | POA: Diagnosis not present

## 2016-05-02 DIAGNOSIS — M1712 Unilateral primary osteoarthritis, left knee: Secondary | ICD-10-CM | POA: Diagnosis not present

## 2016-05-02 DIAGNOSIS — M25561 Pain in right knee: Secondary | ICD-10-CM | POA: Diagnosis not present

## 2016-05-02 DIAGNOSIS — M1711 Unilateral primary osteoarthritis, right knee: Secondary | ICD-10-CM | POA: Diagnosis not present

## 2016-05-05 ENCOUNTER — Other Ambulatory Visit: Payer: Self-pay | Admitting: Internal Medicine

## 2016-05-05 ENCOUNTER — Other Ambulatory Visit: Payer: Self-pay

## 2016-05-05 ENCOUNTER — Telehealth: Payer: Self-pay

## 2016-05-05 MED ORDER — CLOPIDOGREL BISULFATE 75 MG PO TABS: 75.0000 mg | ORAL_TABLET | Freq: Every day | ORAL | 1 refills | Status: DC

## 2016-05-05 NOTE — Telephone Encounter (Signed)
Pt called in req that a RX be called in she has a bad cough started couple of days ago,feels congested coughing up yellow mucus. Explained to pt she may need to make appt to be seen. 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  Last Ov- 11-11-15

## 2016-05-05 NOTE — Telephone Encounter (Signed)
Tessalon 200mg  1 po BID PRN cough suppressant # 30 + 1

## 2016-05-08 ENCOUNTER — Encounter: Payer: Self-pay | Admitting: Physician Assistant

## 2016-05-08 ENCOUNTER — Ambulatory Visit (INDEPENDENT_AMBULATORY_CARE_PROVIDER_SITE_OTHER): Payer: Medicare Other | Admitting: Physician Assistant

## 2016-05-08 VITALS — BP 120/72 | HR 60 | Temp 97.5°F | Resp 16 | Wt 230.5 lb

## 2016-05-08 DIAGNOSIS — J988 Other specified respiratory disorders: Secondary | ICD-10-CM

## 2016-05-08 DIAGNOSIS — N760 Acute vaginitis: Secondary | ICD-10-CM

## 2016-05-08 DIAGNOSIS — R309 Painful micturition, unspecified: Secondary | ICD-10-CM

## 2016-05-08 DIAGNOSIS — B9689 Other specified bacterial agents as the cause of diseases classified elsewhere: Secondary | ICD-10-CM

## 2016-05-08 LAB — URINALYSIS, ROUTINE W REFLEX MICROSCOPIC
Bilirubin Urine: NEGATIVE
Glucose, UA: NEGATIVE
Hgb urine dipstick: NEGATIVE
Ketones, ur: NEGATIVE
Leukocytes, UA: NEGATIVE
Nitrite: NEGATIVE
Protein, ur: NEGATIVE
Specific Gravity, Urine: 1.015 (ref 1.001–1.035)
pH: 7 (ref 5.0–8.0)

## 2016-05-08 LAB — WET PREP FOR TRICH, YEAST, CLUE
Clue Cells Wet Prep HPF POC: NONE SEEN
Trich, Wet Prep: NONE SEEN
WBC, Wet Prep HPF POC: NONE SEEN
Yeast Wet Prep HPF POC: NONE SEEN

## 2016-05-08 MED ORDER — ALBUTEROL SULFATE HFA 108 (90 BASE) MCG/ACT IN AERS: 2.0000 | INHALATION_SPRAY | Freq: Four times a day (QID) | RESPIRATORY_TRACT | 0 refills | Status: DC | PRN

## 2016-05-08 MED ORDER — AZITHROMYCIN 250 MG PO TABS: ORAL_TABLET | ORAL | 0 refills | Status: DC

## 2016-05-08 MED ORDER — FLUCONAZOLE 150 MG PO TABS: 150.0000 mg | ORAL_TABLET | Freq: Once | ORAL | 0 refills | Status: AC

## 2016-05-08 NOTE — Telephone Encounter (Signed)
Patient made an appt to be seen in office on 05/08/16

## 2016-05-08 NOTE — Progress Notes (Signed)
Patient ID: WILHEMENA ARIAN MRN: [id number], DOB: December 06, 1945, 70 y.o. Date of Encounter: 05/08/2016, 11:42 AM    Chief Complaint:  Chief Complaint  Patient presents with  . URI    burning with urination     HPI: 70 y.o. year old AA female presents with above.   Says that she has had a cough for about a week now. Has been using Mucinex  but it isn't resolving. Has had no head or nasal congestion--- has all just been in her chest. Feels like she is wheezing a little bit at times. Has had no fevers or chills.  Says that sometimes after she urinates when she sits back down she feels a little bit of discomfort in her low abdomen. 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 When we go to stand up out of the chair while holding infant it was very uncomfortable and difficult getting up out of the chair. Has not really had dysuria urgency or frequency. Also has not been having actual itching in the vaginal skin and has seen no discharge.     Home Meds:   Outpatient Medications Prior to Visit  Medication Sig Dispense Refill  . ALPRAZolam (XANAX) 1 MG tablet TAKE 1 TABLET BY MOUTH TWICE A DAY AS NEEDED 60 tablet 2  . aspirin 81 MG tablet Take 81 mg by mouth daily.      [name] atorvastatin (LIPITOR) 80 MG tablet Take 1 tablet (80 mg total) by mouth daily. 90 tablet 1  . clopidogrel (PLAVIX) 75 MG tablet Take 1 tablet (75 mg total) by mouth daily. 90 tablet 1  . EPIPEN 2-PAK 0.3 MG/0.3ML SOAJ injection USE AS DIRECTED FOR SEVERE ALLERGIC REACTION 2 Device 0  . fexofenadine (ALLEGRA) 180 MG tablet Take 1 tablet (180 mg total) by mouth daily. 30 tablet 5  . fish oil-omega-3 fatty acids 1000 MG capsule Take 1 g by mouth. Takes occasionally    . fluticasone (FLONASE) 50 MCG/ACT nasal spray Place 1 spray into  both nostrils 2 (two) times daily. 48 g 5  . losartan (COZAAR) 50 MG tablet Take 1 tablet (50 mg total) by mouth daily. 90 tablet 3  . metoprolol (LOPRESSOR) 50 MG tablet TAKE 1 TABLET BY MOUTH IN THE MORNING AND 1&1/2 IN THE EVENING 90 tablet 5  . Misc Natural Products (GLUCOSAMINE CHOND COMPLEX/MSM PO) Take by mouth. Occasionally    . Multiple Vitamin (MULTIVITAMIN) tablet Take 1 tablet by mouth. occasionally    . nitroGLYCERIN (NITROSTAT) 0.4 MG SL tablet Place 1 tablet (0.4 mg total) under the tongue every 5 (five) minutes as needed for chest pain. 25 tablet 3  . pantoprazole (PROTONIX) 40 MG tablet TAKE 1 TABLET EVERY DAY 90 tablet 3  . triamterene-hydrochlorothiazide (MAXZIDE-25) 37.5-25 MG tablet TAKE 1 TABLET BY MOUTH ONCE DAILY 90 tablet 2  . zolpidem (AMBIEN) 10 MG tablet TAKE 1 TABLET AT BEDTIME AS NEEDED FOR SLEEP 30 tablet 2   No facility-administered medications prior to visit.     Allergies:  Allergies  Allergen Reactions  . Ciprofloxacin   . Citalopram Hydrobromide   . Escitalopram Oxalate   . Paroxetine   . Polysporin [Bacitracin-Polymyxin B]       Review of Systems: See HPI for pertinent ROS. All other  ROS negative.    Physical Exam: Blood pressure 120/72, pulse 60, temperature 97.5 F (36.4 C), temperature source Oral, resp. rate 16, weight 230 lb 8 oz (104.6 kg)., Body mass index is 38.95 kg/m. General: AAF.  Appears in no acute distress. HEENT: Normocephalic, atraumatic, eyes without discharge, sclera non-icteric, nares are without discharge. Bilateral auditory canals clear, TM's are without perforation, pearly grey and translucent with reflective cone of light bilaterally. Oral cavity moist, posterior pharynx without exudate, erythema, peritonsillar abscess, or post nasal drip.  Neck: Supple. No thyromegaly. No lymphadenopathy. Lungs: Very slight wheeze at right base. Remainder of exam is with clear breath sounds. Clear bilaterally to auscultation. Breathing is  unlabored. Heart: Regular rhythm. No murmurs, rubs, or gallops. Abdomen: Soft, non-tender, non-distended with normoactive bowel sounds. No hepatomegaly. No rebound/guarding. No obvious abdominal masses. Msk:  Strength and tone normal for age. Pelvic Exam: External genitalia normal. Vaginal mucosa normal. There is no/ very minimal vaginal discharge present. Bimanual exam is normal with no cervical motion tenderness. Extremities/Skin: Warm and dry.  Neuro: Alert and oriented X 3. Moves all extremities spontaneously. Gait is normal. CNII-XII grossly in tact. Psych:  Responds to questions appropriately with a normal affect.   Results for orders placed or performed in visit on 05/08/16  Urinalysis, Routine w reflex microscopic (not at [facility])  Result Value Ref Range   Color, Urine YELLOW YELLOW   APPearance CLEAR CLEAR   Specific Gravity, Urine 1.015 1.001 - 1.035   pH 7.0 5.0 - 8.0   Glucose, UA NEGATIVE NEGATIVE   Bilirubin Urine NEGATIVE NEGATIVE   Ketones, ur NEGATIVE NEGATIVE   Hgb urine dipstick NEGATIVE NEGATIVE   Protein, ur NEGATIVE NEGATIVE   Nitrite NEGATIVE NEGATIVE   Leukocytes, UA NEGATIVE NEGATIVE   Wet Prep also normal. There was no yeast and no clue cells.  ASSESSMENT AND PLAN:  70 y.o. year old female with  1. Vaginitis and vulvovaginitis - GC/Chlamydia Probe Amp - WET PREP FOR TRICH, YEAST, CLUE  Reassured her that the wet prep was negative. See history of present illness regarding her symptoms-- After I discussed that wet prep and UTI negative. Patient thinks that her discomfort is just secondary to sitting in upright chair recently-- see history of present illness for detail.  2. Bacterial respiratory infection - azithromycin (ZITHROMAX) 250 MG tablet; Day 1: Take 2 daily. Days 2-5: Take 1 daily.  Dispense: 6 tablet; Refill: 0 - albuterol (PROVENTIL HFA;VENTOLIN HFA) 108 (90 Base) MCG/ACT inhaler; Inhale 2 puffs into the lungs every 6 (six) hours as needed for  wheezing or shortness of breath.  Dispense: 1 Inhaler; Refill: 0 - fluconazole (DIFLUCAN) 150 MG tablet; Take 1 tablet (150 mg total) by mouth once.  Dispense: 1 tablet; Refill: 0 She reports that usually antibiotic causes yeast infection. She can take the Diflucan at the end of the antibiotics if indicated. As well, wil give her inhaler to use if needed for wheezing. 3. Urination pain - Urinalysis, Routine w reflex microscopic (not at [facility])   Signed, [facility] [place], [place], [facility] 05/08/2016 11:42 AM

## 2016-05-09 LAB — GC/CHLAMYDIA PROBE AMP
CT Probe RNA: NOT DETECTED
GC Probe RNA: NOT DETECTED

## 2016-05-15 ENCOUNTER — Ambulatory Visit: Payer: Medicare Other | Admitting: Physician Assistant

## 2016-05-15 ENCOUNTER — Ambulatory Visit (INDEPENDENT_AMBULATORY_CARE_PROVIDER_SITE_OTHER): Payer: Medicare Other

## 2016-05-15 DIAGNOSIS — J309 Allergic rhinitis, unspecified: Secondary | ICD-10-CM | POA: Diagnosis not present

## 2016-05-24 ENCOUNTER — Ambulatory Visit (INDEPENDENT_AMBULATORY_CARE_PROVIDER_SITE_OTHER): Payer: Medicare Other | Admitting: Physician Assistant

## 2016-05-24 VITALS — BP 122/74 | HR 60 | Temp 98.2°F | Resp 16 | Wt 231.0 lb

## 2016-05-24 DIAGNOSIS — G4700 Insomnia, unspecified: Secondary | ICD-10-CM

## 2016-05-24 DIAGNOSIS — Z23 Encounter for immunization: Secondary | ICD-10-CM

## 2016-05-24 DIAGNOSIS — I2510 Atherosclerotic heart disease of native coronary artery without angina pectoris: Secondary | ICD-10-CM

## 2016-05-24 DIAGNOSIS — E669 Obesity, unspecified: Secondary | ICD-10-CM

## 2016-05-24 DIAGNOSIS — M1000 Idiopathic gout, unspecified site: Secondary | ICD-10-CM

## 2016-05-24 DIAGNOSIS — Z1283 Encounter for screening for malignant neoplasm of skin: Secondary | ICD-10-CM

## 2016-05-24 DIAGNOSIS — E119 Type 2 diabetes mellitus without complications: Secondary | ICD-10-CM | POA: Diagnosis not present

## 2016-05-24 DIAGNOSIS — I10 Essential (primary) hypertension: Secondary | ICD-10-CM | POA: Diagnosis not present

## 2016-05-24 DIAGNOSIS — E785 Hyperlipidemia, unspecified: Secondary | ICD-10-CM

## 2016-05-24 DIAGNOSIS — F419 Anxiety disorder, unspecified: Secondary | ICD-10-CM

## 2016-05-24 DIAGNOSIS — K219 Gastro-esophageal reflux disease without esophagitis: Secondary | ICD-10-CM

## 2016-05-24 DIAGNOSIS — Z79899 Other long term (current) drug therapy: Secondary | ICD-10-CM | POA: Diagnosis not present

## 2016-05-24 LAB — CBC WITH DIFFERENTIAL/PLATELET
Basophils Absolute: 46 cells/uL (ref 0–200)
Basophils Relative: 1 %
Eosinophils Absolute: 184 cells/uL (ref 15–500)
Eosinophils Relative: 4 %
HCT: 38 % (ref 35.0–45.0)
Hemoglobin: 12.4 g/dL (ref 12.0–15.0)
Lymphocytes Relative: 47 %
Lymphs Abs: 2162 cells/uL (ref 850–3900)
MCH: 29.5 pg (ref 27.0–33.0)
MCHC: 32.6 g/dL (ref 32.0–36.0)
MCV: 90.5 fL (ref 80.0–100.0)
MPV: 8.5 fL (ref 7.5–12.5)
Monocytes Absolute: 552 cells/uL (ref 200–950)
Monocytes Relative: 12 %
Neutro Abs: 1656 cells/uL (ref 1500–7800)
Neutrophils Relative %: 36 %
Platelets: 208 10*3/uL (ref 140–400)
RBC: 4.2 MIL/uL (ref 3.80–5.10)
RDW: 14.1 % (ref 11.0–15.0)
WBC: 4.6 10*3/uL (ref 3.8–10.8)

## 2016-05-24 LAB — VITAMIN B12: Vitamin B-12: 1013 pg/mL (ref 200–1100)

## 2016-05-24 NOTE — Addendum Note (Signed)
Addended by: Vonna Kotyk A on: 05/24/2016 09:25 AM   Modules accepted: Orders

## 2016-05-24 NOTE — Progress Notes (Signed)
Patient ID: Madeline Wood MRN: [id number], DOB: 11/10/1945, 70 y.o. Date of Encounter: 05/24/2016,      List the Names of Other Physician/Practitioners you currently use: Dr. Budd Palmer Allergy Specialist Cardiologist    Chief Complaint: Routine 6 month f/u OV  HPI: 70 y.o. y/o female  here for routine 6 month f/u OV.  She is taking all medications as directed and is having no adverse effects. He is taking Lipitor. No myalgias or other adverse effects. He is taking blood pressure medications. No lightheadedness or other adverse effects. She is taking Plavix as directed. No bleeding. She is taking medications for GERD and is controlling those symptoms.  I noted that we had on her problem list gout but that she is on no medications for this. Asked if she had any recent symptoms that may have been from a gout flare. He states that yes just a couple of weeks ago her left big toe was really painful for a couple of days. Says that prior to that it up in years since a flare.  She has follow-up appointment with orthopedics regarding her knee. Has follow-up appointment with cardiology at the beginning of October. Her labs today so they can review those results at that visit.  She has a skin lesion on the left side of her face that she is concerned about. Wants to see a dermatologist about this.  She has no additional complaints or concerns today.  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 She says that when she is not busy working she is kept busy by family members. Is that she has one brother who is very ill that she is constantly helping to care for and get to his appointments etc.   Review of Systems: Consitutional: No fever, chills, fatigue, night sweats, lymphadenopathy. No significant/unexplained weight changes. Eyes: No visual changes, eye redness, or discharge. ENT/Mouth: No ear pain, sore throat, nasal drainage, or sinus pain. Cardiovascular: No chest  pressure,heaviness, tightness or squeezing, even with exertion. No increased shortness of breath or dyspnea on exertion.No palpitations, edema, orthopnea, PND. Respiratory: No cough, hemoptysis, SOB, or wheezing. Gastrointestinal: No anorexia, dysphagia, reflux, pain, nausea, vomiting, hematemesis, diarrhea, constipation, BRBPR, or melena. Breast: No mass, nodules, bulging, or retraction. No skin changes or inflammation. No nipple discharge. No lymphadenopathy. Genitourinary: No dysuria, hematuria, incontinence, vaginal discharge, pruritis, burning, abnormal bleeding, or pain. Musculoskeletal: See HPI Skin: No rash, pruritis, or concerning lesions. Neurological: No headache, dizziness, syncope, seizures, tremors, memory loss, coordination problems, or paresthesias. Psychological: No anxiety, depression, hallucinations, SI/HI. Endocrine: No polydipsia, polyphagia, polyuria, or known diabetes.No increased fatigue. No palpitations/rapid heart rate. No significant/unexplained weight change. All other systems were reviewed and are otherwise negative.  Past Medical History:  Diagnosis Date  . 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 Anxiety   . CAD (coronary artery disease)    stent of distal RCA in 2006  . Diabetes mellitus without complication ([place])   . Diverticulosis   . GERD (gastroesophageal reflux disease)   . Gout   . Hiatal hernia   . History of nuclear stress test 03/2011   negative lexiscan myoview; normal perfusion  . Hyperlipidemia   . Hypertension   . Knee pain   . Obesity   . Positive TB test   . Venous insufficiency      Past Surgical History:  Procedure Laterality Date  . ABDOMINAL HYSTERECTOMY    . BACK SURGERY  2012  . BREAST LUMPECTOMY    .  CORONARY ANGIOPLASTY WITH STENT PLACEMENT  06/29/2005   Taxus 2.5x54mm DES to distal RCA (Dr. Tami Ribas)  . TRANSTHORACIC ECHOCARDIOGRAM  12/20/2012   EF 99991111, normal systolic function, mild hypokinesis of inf myocardium; calcified MV annulua,  LA & RA mildly dilated    Home Meds:  Outpatient Medications Prior to Visit  Medication Sig Dispense Refill  . albuterol (PROVENTIL HFA;VENTOLIN HFA) 108 (90 Base) MCG/ACT inhaler Inhale 2 puffs into the lungs every 6 (six) hours as needed for wheezing or shortness of breath. 1 Inhaler 0  . ALPRAZolam (XANAX) 1 MG tablet TAKE 1 TABLET BY MOUTH TWICE A DAY AS NEEDED 60 tablet 2  . aspirin 81 MG tablet Take 81 mg by mouth daily.      [name] atorvastatin (LIPITOR) 80 MG tablet Take 1 tablet (80 mg total) by mouth daily. 90 tablet 1  . azithromycin (ZITHROMAX) 250 MG tablet Day 1: Take 2 daily. Days 2-5: Take 1 daily. 6 tablet 0  . clopidogrel (PLAVIX) 75 MG tablet Take 1 tablet (75 mg total) by mouth daily. 90 tablet 1  . EPIPEN 2-PAK 0.3 MG/0.3ML SOAJ injection USE AS DIRECTED FOR SEVERE ALLERGIC REACTION 2 Device 0  . fexofenadine (ALLEGRA) 180 MG tablet Take 1 tablet (180 mg total) by mouth daily. 30 tablet 5  . fish oil-omega-3 fatty acids 1000 MG capsule Take 1 g by mouth. Takes occasionally    . fluticasone (FLONASE) 50 MCG/ACT nasal spray Place 1 spray into both nostrils 2 (two) times daily. 48 g 5  . losartan (COZAAR) 50 MG tablet Take 1 tablet (50 mg total) by mouth daily. 90 tablet 3  . metoprolol (LOPRESSOR) 50 MG tablet TAKE 1 TABLET BY MOUTH IN THE MORNING AND 1&1/2 IN THE EVENING 90 tablet 5  . Misc Natural Products (GLUCOSAMINE CHOND COMPLEX/MSM PO) Take by mouth. Occasionally    . Multiple Vitamin (MULTIVITAMIN) tablet Take 1 tablet by mouth. occasionally    . nitroGLYCERIN (NITROSTAT) 0.4 MG SL tablet Place 1 tablet (0.4 mg total) under the tongue every 5 (five) minutes as needed for chest pain. 25 tablet 3  . pantoprazole (PROTONIX) 40 MG tablet TAKE 1 TABLET EVERY DAY 90 tablet 3  . triamterene-hydrochlorothiazide (MAXZIDE-25) 37.5-25 MG tablet TAKE 1 TABLET BY MOUTH ONCE DAILY 90 tablet 2  . zolpidem (AMBIEN) 10 MG tablet TAKE 1 TABLET AT BEDTIME AS NEEDED FOR SLEEP 30 tablet 2    No facility-administered medications prior to visit.     Allergies:  Allergies  Allergen Reactions  . Ciprofloxacin   . Citalopram Hydrobromide   . Escitalopram Oxalate   . Paroxetine   . Polysporin [Bacitracin-Polymyxin B]     Social History   Social History  . Marital status: Single    Spouse name: N/A  . Number of children: 1  . 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 Smoking status: Former Smoker    Quit date: 12/20/2001  . Smokeless tobacco: Never Used  . Alcohol use No  . Drug use: No  . Sexual activity: Not on file   Other Topics Concern  . Not on file   Social History Narrative  . No narrative on file    Family History  Problem Relation Age of Onset  . Diabetes Sister   . Pancreatic cancer Brother   . Ovarian cancer Mother   . Other Father     homicide  . Heart attack  Brother     x2  . Hypertension Brother   . Hypertension Sister     Physical Exam: Blood pressure 122/74, pulse 60, temperature 98.2 F (36.8 C), temperature source Oral, resp. rate 16, weight 231 lb (104.8 kg)., Body mass index is 39.04 kg/m. General: Obese AAF. Appears in no acute distress. Neck: Supple. Trachea midline. No thyromegaly. Full ROM. No lymphadenopathy.No Carotid Bruits. Lungs: Clear to auscultation bilaterally without wheezes, rales, or rhonchi. Breathing is of normal effort and unlabored. Cardiovascular: RRR with S1 S2. No murmurs, rubs, or gallops. Distal pulses 2+ symmetrically. No carotid or abdominal bruits. Abdomen: Soft, non-tender, non-distended with normoactive bowel sounds. No hepatosplenomegaly or masses. No rebound/guarding. No CVA tenderness. No hernias.  Musculoskeletal: Full range of motion and 5/5 strength throughout.  Skin: Warm and moist without erythema, ecchymosis, wounds, or rash. She does have skin lesion on left side of face---papule--same color as surrounding skin. Neuro: A+Ox3. CN  II-XII grossly intact. Moves all extremities spontaneously. Full sensation throughout. Normal gait.  Psych:  Responds to questions appropriately with a normal affect. Diabetic foot exam: Inspection is normal. 2+ dorsalis pedis and posterior tibial pulses bilaterally. Sensation is intact    Assessment/Plan:  70 y.o. y/o female here for :  Skin cancer screening - Ambulatory referral to Dermatology   Insomnia At [place] 07/2014  reported that she was able to fall asleep, but even with the Xanax, has difficulty staying asleep. 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 Said she was on regular sleep/wake cycle but still having insomnia. Active during day.   Informed not to take xanax and ambien together.  At that visit prescribed Ambien 10 mg #15+1 refill. At [place] 01/2015--she states that the Ambien does work well for her when she needs it. Causes no adverse effects. However see you says that she does not use it unless she really needs it. Says that she still has a few pills left from the prescription given at visit 07/2014   Atherosclerosis of native coronary artery of native heart without angina pectoris Managed by cardiology On Plavix--Check CBC  Gastroesophageal reflux disease, esophagitis presence not specified Symptoms controlled with current med. Check Mg and Vit B12 given PPI use.  History of colonic polyps See Below  Essential hypertension Blood pressure at goal. Continue current medications. Check lab to monitor. 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 04/2016--reports that she  is still seeing allergist and gets allergy shots routinely.  Anxiety Stable/Controlled with current medication. Cont current medication.  Obesity She has been educated regarding low cholesterol low carbohydrate diet. Has also been educated regarding proper exercise but this is limited by her knee pain.  Hiatal hernia She sees Dr. Fuller Plan for GI.  Diabetes mellitus without complication - Hemoglobin A1c - Microalbumin, urine  So far this has been controlled without medications. Eye exam May 2016 Foot exam entered into [employer] 01/2015   Bilateral leg edema Conrolled with current medications.  Insomnia Controlled with current medications.  She wants flu shot today. She has had Medicare physical within the past 12 months. See that note for preventive care update.  Regular office visit 6 months or sooner if needed   SignedOlean Ree [place], [place], [facility] 05/24/2016 9:08 AM

## 2016-05-25 LAB — LIPID PANEL
Cholesterol: 181 mg/dL (ref 125–200)
HDL: 87 mg/dL (ref >=46)
LDL Cholesterol: 81 mg/dL (ref <130)
Total CHOL/HDL Ratio: 2.1 Ratio (ref <=5.0)
Triglycerides: 63 mg/dL (ref <150)
VLDL: 13 mg/dL (ref <30)

## 2016-05-25 LAB — COMPLETE METABOLIC PANEL WITH GFR
ALT: 12 U/L (ref 6–29)
AST: 23 U/L (ref 10–35)
Albumin: 3.8 g/dL (ref 3.6–5.1)
Alkaline Phosphatase: 41 U/L (ref 33–130)
BUN: 15 mg/dL (ref 7–25)
CO2: 24 mmol/L (ref 20–31)
Calcium: 9.5 mg/dL (ref 8.6–10.4)
Chloride: 97 mmol/L — ABNORMAL LOW (ref 98–110)
Creat: 0.75 mg/dL (ref 0.60–0.93)
GFR, Est African American: >89 mL/min (ref >=60)
GFR, Est Non African American: 81 mL/min (ref >=60)
Glucose, Bld: 81 mg/dL (ref 70–99)
Potassium: 3.9 mmol/L (ref 3.5–5.3)
Sodium: 135 mmol/L (ref 135–146)
Total Bilirubin: 0.5 mg/dL (ref 0.2–1.2)
Total Protein: 6.4 g/dL (ref 6.1–8.1)

## 2016-05-25 LAB — HEMOGLOBIN A1C
Hgb A1c MFr Bld: 6.2 % — ABNORMAL HIGH (ref <5.7)
Mean Plasma Glucose: 131 mg/dL

## 2016-05-25 LAB — MAGNESIUM: Magnesium: 1.5 mg/dL (ref 1.5–2.5)

## 2016-05-25 LAB — URIC ACID: Uric Acid, Serum: 5.6 mg/dL (ref 2.5–7.0)

## 2016-05-25 LAB — MICROALBUMIN, URINE: Microalb, Ur: 0.5 mg/dL

## 2016-05-26 ENCOUNTER — Ambulatory Visit (INDEPENDENT_AMBULATORY_CARE_PROVIDER_SITE_OTHER): Payer: Medicare Other

## 2016-05-26 DIAGNOSIS — J309 Allergic rhinitis, unspecified: Secondary | ICD-10-CM | POA: Diagnosis not present

## 2016-06-01 ENCOUNTER — Encounter: Payer: Self-pay | Admitting: Internal Medicine

## 2016-06-01 ENCOUNTER — Other Ambulatory Visit: Payer: Self-pay | Admitting: Internal Medicine

## 2016-06-01 ENCOUNTER — Ambulatory Visit (INDEPENDENT_AMBULATORY_CARE_PROVIDER_SITE_OTHER): Payer: Medicare Other | Admitting: Internal Medicine

## 2016-06-01 VITALS — BP 121/77 | HR 62 | Ht 66.0 in | Wt 231.2 lb

## 2016-06-01 DIAGNOSIS — I2510 Atherosclerotic heart disease of native coronary artery without angina pectoris: Secondary | ICD-10-CM

## 2016-06-01 DIAGNOSIS — E782 Mixed hyperlipidemia: Secondary | ICD-10-CM | POA: Diagnosis not present

## 2016-06-01 DIAGNOSIS — I10 Essential (primary) hypertension: Secondary | ICD-10-CM | POA: Diagnosis not present

## 2016-06-01 DIAGNOSIS — E119 Type 2 diabetes mellitus without complications: Secondary | ICD-10-CM | POA: Diagnosis not present

## 2016-06-01 MED ORDER — ATORVASTATIN CALCIUM 80 MG PO TABS: 80.0000 mg | ORAL_TABLET | Freq: Every day | ORAL | 3 refills | Status: DC

## 2016-06-01 NOTE — Progress Notes (Signed)
OFFICE NOTE  Chief Complaint:   No complaints  Primary Care Physician: Karis Juba, PA-C  HPI:  Madeline Wood is a 70 year old female patient formerly followed by Dr. Rollene Fare, who works as a Photographer. She has a history of coronary disease and had a drug-eluting stent placed to the distal RCA in 2006. She had a negative Myoview in 2012. Just has a history of obesity, hypertension and dyslipidemia. In 2012 underwent back surgery by Dr. Lynann Bologna and has had a good recovery from that.  Recently she had some problems with low blood pressure and had a decrease in her losartan dose to 50 mg once daily which has helped. She has however been having some lower extremity swelling.  When she last saw Dr. Rollene Fare in April 2014, he recommended starting her Dyazide to one full tablet daily. She did not start that given her concern about the change in her blood pressure. It is noted however her blood pressure is slightly higher today 150/84. She is asymptomatic, denying any chest pain, shortness of breath, palpitations, presyncope or syncopal symptoms.  Madeline Wood returns today for followup.  She denies any chest pain or worsening shortness of breath. Recent laboratory work shows good control of her cholesterol.  She is currently on atorvastatin 80 mg daily and recently had that he had discontinued due to excellent cholesterol control. She is also on aspirin and Plavix as well as losartan and Lopressor.  06/01/2016  Madeline Wood returns today for follow-up. She's done very well over he past year. r worsening shortness of breath. Weight is stable and I've encouraged her to continue to work on lowering it. Cholesterol control is been excellent. She has been on long-standing aspirin and Plavix however her stent was in 2006. Is not clear that there is an ongoing indication for dual antiplatelet therapy.Blood pressure is well controlled. EKG shows normal sinus rhythm at 62.  PMHx:  Past Medical  History:  Diagnosis Date  . 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 Anxiety   . CAD (coronary artery disease)    stent of distal RCA in 2006  . Diabetes mellitus without complication ([place])   . Diverticulosis   . GERD (gastroesophageal reflux disease)   . Gout   . Hiatal hernia   . History of nuclear stress test 03/2011   negative lexiscan myoview; normal perfusion  . Hyperlipidemia   . Hypertension   . Knee pain   . Obesity   . Positive TB test   . Venous insufficiency     Past Surgical History:  Procedure Laterality Date  . ABDOMINAL HYSTERECTOMY    . BACK SURGERY  2012  . BREAST LUMPECTOMY    . CORONARY ANGIOPLASTY WITH STENT PLACEMENT  06/29/2005   Taxus 2.5x68mm DES to distal RCA (Dr. Tami Ribas)  . TRANSTHORACIC ECHOCARDIOGRAM  12/20/2012   EF 99991111, normal systolic function, mild hypokinesis of inf myocardium; calcified MV annulua, LA & RA mildly dilated    FAMHx:  Family History  Problem Relation Age of Onset  . Diabetes Sister   . Pancreatic cancer Brother   . Ovarian cancer Mother   . Other Father     homicide  . Heart attack Brother     x2  . Hypertension Brother   . Hypertension Sister     SOCHx:   reports that she quit smoking about 14 years ago. She has never used smokeless tobacco. She reports that she does not drink alcohol or use drugs.  ALLERGIES:  Allergies  Allergen Reactions  . Ciprofloxacin   . Citalopram Hydrobromide   . Escitalopram Oxalate   . Paroxetine   . Polysporin [Bacitracin-Polymyxin B]     ROS: A comprehensive review of systems was negative.  HOME MEDS: Current Outpatient Prescriptions  Medication Sig Dispense Refill  . albuterol (PROVENTIL HFA;VENTOLIN HFA) 108 (90 Base) MCG/ACT inhaler Inhale 2 puffs into the lungs every 6 (six) hours as needed for wheezing or shortness of breath. 1 Inhaler 0  . ALPRAZolam (XANAX) 1 MG tablet TAKE 1 TABLET BY MOUTH TWICE A DAY AS NEEDED 60 tablet 2  . aspirin 81 MG tablet Take 81 mg by mouth daily.        [name] atorvastatin (LIPITOR) 80 MG tablet Take 1 tablet (80 mg total) by mouth daily. 90 tablet 1  . azithromycin (ZITHROMAX) 250 MG tablet Day 1: Take 2 daily. Days 2-5: Take 1 daily. 6 tablet 0  . EPIPEN 2-PAK 0.3 MG/0.3ML SOAJ injection USE AS DIRECTED FOR SEVERE ALLERGIC REACTION 2 Device 0  . fexofenadine (ALLEGRA) 180 MG tablet Take 1 tablet (180 mg total) by mouth daily. 30 tablet 5  . fish oil-omega-3 fatty acids 1000 MG capsule Take 1 g by mouth. Takes occasionally    . fluticasone (FLONASE) 50 MCG/ACT nasal spray Place 1 spray into both nostrils 2 (two) times daily. 48 g 5  . losartan (COZAAR) 50 MG tablet Take 1 tablet (50 mg total) by mouth daily. 90 tablet 3  . metoprolol (LOPRESSOR) 50 MG tablet TAKE 1 TABLET BY MOUTH IN THE MORNING AND 1&1/2 IN THE EVENING 90 tablet 5  . Misc Natural Products (GLUCOSAMINE CHOND COMPLEX/MSM PO) Take by mouth. Occasionally    . Multiple Vitamin (MULTIVITAMIN) tablet Take 1 tablet by mouth. occasionally    . nitroGLYCERIN (NITROSTAT) 0.4 MG SL tablet Place 1 tablet (0.4 mg total) under the tongue every 5 (five) minutes as needed for chest pain. 25 tablet 3  . pantoprazole (PROTONIX) 40 MG tablet TAKE 1 TABLET EVERY DAY 90 tablet 3  . triamterene-hydrochlorothiazide (MAXZIDE-25) 37.5-25 MG tablet TAKE 1 TABLET BY MOUTH ONCE DAILY 90 tablet 2  . zolpidem (AMBIEN) 10 MG tablet TAKE 1 TABLET AT BEDTIME AS NEEDED FOR SLEEP 30 tablet 2   No current facility-administered medications for this visit.     LABS/IMAGING: No results found for this or any previous visit (from the past 48 hour(s)). No results found.  VITALS: BP 121/77   Pulse 62   Ht 5\' 6"  (1.676 m)   Wt 231 lb 3.2 oz (104.9 kg)   BMI 37.32 kg/m   EXAM: General appearance: alert and no distress Neck: no adenopathy, no carotid bruit, no JVD, supple, symmetrical, trachea midline and thyroid not enlarged, symmetric, no tenderness/mass/nodules Lungs: clear to auscultation bilaterally Heart:  regular rate and rhythm, S1, S2 normal, no murmur, click, rub or gallop Abdomen: soft, non-tender; bowel sounds normal; no masses,  no organomegaly and obese Extremities: edema 1+ bilaterally and no venous stasis changes or varicose veins Pulses: 2+ and symmetric Skin: Skin color, texture, turgor normal. No rashes or lesions Neurologic: Grossly normal Psych: Pleasant mood, affect  EKG: Normal sinus rhythm at 62  ASSESSMENT: 1. Coronary disease status post PCI to the distal RCA in 2006 (DES) 2. Obesity 3. Hypertension- at goal  4. Dyslipidemia  PLAN: 1.   Madeline Wood is doing well overall. She denies any anginal symptoms. Her cholesterol was recently checked and is at goal. Blood pressure is at  goal today. Weight still needs to come down - I encouraged exercise and weight loss. I do not see a clear indication for continuing dual antiplatelet therapy as her stent was remote in 2006. I will discontinue plavix today - continue aspirin. Overall she is doing well and I recommend followup in 1 year.  Pixie Casino, MD, [facility] Attending Cardiologist [place] 06/01/2016, 10:12 AM

## 2016-06-01 NOTE — Patient Instructions (Addendum)
Medication Instructions:  STOP Plavix  Labwork: None   Testing/Procedures: None  Follow-Up: Your physician wants you to follow-up in: 12 months with [facility]. You will receive a reminder letter in the mail two months in advance. If you don't receive a letter, please call our office to schedule the follow-up appointment.  Any Other Special Instructions Will Be Listed Below (If Applicable).   If you need a refill on your cardiac medications before your next appointment, please call your pharmacy.

## 2016-06-06 ENCOUNTER — Ambulatory Visit (INDEPENDENT_AMBULATORY_CARE_PROVIDER_SITE_OTHER): Payer: Medicare Other | Admitting: *Deleted

## 2016-06-06 DIAGNOSIS — H1010 Acute atopic conjunctivitis, unspecified eye: Secondary | ICD-10-CM

## 2016-06-06 DIAGNOSIS — J3089 Other allergic rhinitis: Secondary | ICD-10-CM

## 2016-06-06 DIAGNOSIS — J302 Other seasonal allergic rhinitis: Principal | ICD-10-CM

## 2016-06-07 ENCOUNTER — Other Ambulatory Visit: Payer: Self-pay | Admitting: Physician Assistant

## 2016-06-13 ENCOUNTER — Ambulatory Visit (INDEPENDENT_AMBULATORY_CARE_PROVIDER_SITE_OTHER): Payer: Medicare Other | Admitting: *Deleted

## 2016-06-13 DIAGNOSIS — J3089 Other allergic rhinitis: Secondary | ICD-10-CM

## 2016-06-13 DIAGNOSIS — H1010 Acute atopic conjunctivitis, unspecified eye: Secondary | ICD-10-CM

## 2016-06-13 DIAGNOSIS — J302 Other seasonal allergic rhinitis: Principal | ICD-10-CM

## 2016-06-20 ENCOUNTER — Ambulatory Visit (INDEPENDENT_AMBULATORY_CARE_PROVIDER_SITE_OTHER): Payer: Medicare Other

## 2016-06-20 DIAGNOSIS — J309 Allergic rhinitis, unspecified: Secondary | ICD-10-CM | POA: Diagnosis not present

## 2016-06-27 ENCOUNTER — Other Ambulatory Visit: Payer: Self-pay | Admitting: Physician Assistant

## 2016-06-27 NOTE — Telephone Encounter (Signed)
Ok to refill 

## 2016-06-28 NOTE — Telephone Encounter (Signed)
Rx phoned in.   

## 2016-06-28 NOTE — Telephone Encounter (Signed)
Approved. #[id number]. 

## 2016-07-04 ENCOUNTER — Other Ambulatory Visit: Payer: Self-pay | Admitting: Physician Assistant

## 2016-07-04 NOTE — Telephone Encounter (Signed)
Medication refilled per protocol. 

## 2016-07-07 ENCOUNTER — Ambulatory Visit (INDEPENDENT_AMBULATORY_CARE_PROVIDER_SITE_OTHER): Payer: Medicare Other | Admitting: *Deleted

## 2016-07-07 DIAGNOSIS — J309 Allergic rhinitis, unspecified: Secondary | ICD-10-CM | POA: Diagnosis not present

## 2016-07-26 ENCOUNTER — Ambulatory Visit (INDEPENDENT_AMBULATORY_CARE_PROVIDER_SITE_OTHER): Payer: Medicare Other | Admitting: *Deleted

## 2016-07-26 DIAGNOSIS — J309 Allergic rhinitis, unspecified: Secondary | ICD-10-CM | POA: Diagnosis not present

## 2016-07-31 DIAGNOSIS — L821 Other seborrheic keratosis: Secondary | ICD-10-CM | POA: Diagnosis not present

## 2016-07-31 DIAGNOSIS — L723 Sebaceous cyst: Secondary | ICD-10-CM | POA: Diagnosis not present

## 2016-08-01 DIAGNOSIS — M1712 Unilateral primary osteoarthritis, left knee: Secondary | ICD-10-CM | POA: Diagnosis not present

## 2016-08-01 DIAGNOSIS — M1711 Unilateral primary osteoarthritis, right knee: Secondary | ICD-10-CM | POA: Diagnosis not present

## 2016-08-03 ENCOUNTER — Ambulatory Visit (INDEPENDENT_AMBULATORY_CARE_PROVIDER_SITE_OTHER): Payer: Medicare Other

## 2016-08-03 DIAGNOSIS — J309 Allergic rhinitis, unspecified: Secondary | ICD-10-CM | POA: Diagnosis not present

## 2016-08-09 ENCOUNTER — Ambulatory Visit (INDEPENDENT_AMBULATORY_CARE_PROVIDER_SITE_OTHER): Payer: Medicare Other

## 2016-08-09 DIAGNOSIS — J309 Allergic rhinitis, unspecified: Secondary | ICD-10-CM

## 2016-08-26 ENCOUNTER — Other Ambulatory Visit: Payer: Self-pay | Admitting: Internal Medicine

## 2016-08-29 ENCOUNTER — Other Ambulatory Visit: Payer: Self-pay | Admitting: Family Medicine

## 2016-08-29 NOTE — Telephone Encounter (Signed)
Rx has been sent to the pharmacy electronically. ° °

## 2016-08-30 NOTE — Telephone Encounter (Signed)
Ok to refill 

## 2016-08-30 NOTE — Telephone Encounter (Signed)
Approved # [id number] 2 

## 2016-08-31 ENCOUNTER — Ambulatory Visit (INDEPENDENT_AMBULATORY_CARE_PROVIDER_SITE_OTHER): Payer: Medicare Other

## 2016-08-31 DIAGNOSIS — M1711 Unilateral primary osteoarthritis, right knee: Secondary | ICD-10-CM | POA: Diagnosis not present

## 2016-08-31 DIAGNOSIS — J309 Allergic rhinitis, unspecified: Secondary | ICD-10-CM

## 2016-09-07 DIAGNOSIS — J3089 Other allergic rhinitis: Secondary | ICD-10-CM | POA: Diagnosis not present

## 2016-09-08 DIAGNOSIS — J301 Allergic rhinitis due to pollen: Secondary | ICD-10-CM | POA: Diagnosis not present

## 2016-09-12 DIAGNOSIS — M1711 Unilateral primary osteoarthritis, right knee: Secondary | ICD-10-CM | POA: Diagnosis not present

## 2016-09-15 NOTE — Addendum Note (Signed)
Addended by: Katherina Right D on: 09/15/2016 12:32 PM   Modules accepted: Orders

## 2016-09-19 DIAGNOSIS — M1711 Unilateral primary osteoarthritis, right knee: Secondary | ICD-10-CM | POA: Diagnosis not present

## 2016-09-20 ENCOUNTER — Ambulatory Visit (INDEPENDENT_AMBULATORY_CARE_PROVIDER_SITE_OTHER): Payer: Medicare Other | Admitting: Physician Assistant

## 2016-09-20 ENCOUNTER — Encounter: Payer: Self-pay | Admitting: Physician Assistant

## 2016-09-20 VITALS — BP 150/100 | HR 76 | Temp 98.7°F | Resp 18 | Wt 238.2 lb

## 2016-09-20 DIAGNOSIS — J988 Other specified respiratory disorders: Secondary | ICD-10-CM | POA: Diagnosis not present

## 2016-09-20 DIAGNOSIS — B9789 Other viral agents as the cause of diseases classified elsewhere: Secondary | ICD-10-CM | POA: Diagnosis not present

## 2016-09-20 NOTE — Progress Notes (Signed)
Patient ID: Madeline Wood MRN: [id number], DOB: Jun 06, 1946, 71 y.o. Date of Encounter: 09/20/2016, 12:25 PM    Chief Complaint:  Chief Complaint  Patient presents with  . Cough    x3days yellow mucus  . Laryngitis     HPI: 71 y.o. year old female presents with above. States that symptoms started Sunday 08/17/17. At that time started with a cough. Started using Mucinex at that time and is continued Mucinex. Coughing up yellowish phlegm. Yesterday she had no voice at all but says that today that is better. Throat has felt irritated. Has not been blowing much out of her nose. Has had no fevers or chills. Mostly all the cough and chest congestion and then the laryngitis and drainage in her throat.     Home Meds:   Outpatient Medications Prior to Visit  Medication Sig Dispense Refill  . albuterol (PROVENTIL HFA;VENTOLIN HFA) 108 (90 Base) MCG/ACT inhaler Inhale 2 puffs into the lungs every 6 (six) hours as needed for wheezing or shortness of breath. 1 Inhaler 0  . ALPRAZolam (XANAX) 1 MG tablet TAKE 1 TABLET BY MOUTH TWICE A DAY 60 tablet 2  . aspirin 81 MG tablet Take 81 mg by mouth daily.      [name] atorvastatin (LIPITOR) 80 MG tablet Take 1 tablet (80 mg total) by mouth daily. 90 tablet 3  . EPIPEN 2-PAK 0.3 MG/0.3ML SOAJ injection USE AS DIRECTED FOR SEVERE ALLERGIC REACTION 2 Device 0  . fexofenadine (ALLEGRA) 180 MG tablet Take 1 tablet (180 mg total) by mouth daily. 30 tablet 5  . fish oil-omega-3 fatty acids 1000 MG capsule Take 1 g by mouth. Takes occasionally    . losartan (COZAAR) 50 MG tablet TAKE 1 TABLET BY MOUTH DAILY 90 tablet 2  . metoprolol (LOPRESSOR) 50 MG tablet TAKE 1 TABLET BY MOUTH IN THE MORNING AND 1&1/2 IN THE EVENING 90 tablet 5  . Misc Natural Products (GLUCOSAMINE CHOND COMPLEX/MSM PO) Take by mouth. Occasionally    . Multiple Vitamin (MULTIVITAMIN) tablet Take 1 tablet by mouth. occasionally    . nitroGLYCERIN (NITROSTAT) 0.4 MG SL tablet Place 1 tablet  (0.4 mg total) under the tongue every 5 (five) minutes as needed for chest pain. 25 tablet 3  . pantoprazole (PROTONIX) 40 MG tablet TAKE 1 TABLET EVERY DAY 90 tablet 3  . triamterene-hydrochlorothiazide (MAXZIDE-25) 37.5-25 MG tablet TAKE 1 TABLET BY MOUTH ONCE DAILY 90 tablet 2  . zolpidem (AMBIEN) 10 MG tablet TAKE 1 TABLET AT BEDTIME AS NEEDED 30 tablet 3  . azithromycin (ZITHROMAX) 250 MG tablet Day 1: Take 2 daily. Days 2-5: Take 1 daily. (Patient not taking: Reported on 09/20/2016) 6 tablet 0  . fluticasone (FLONASE) 50 MCG/ACT nasal spray Place 1 spray into both nostrils 2 (two) times daily. (Patient not taking: Reported on 09/20/2016) 48 g 5   No facility-administered medications prior to visit.     Allergies:  Allergies  Allergen Reactions  . Ciprofloxacin   . Citalopram Hydrobromide   . Escitalopram Oxalate   . Paroxetine   . Polysporin [Bacitracin-Polymyxin B]       Review of Systems: See HPI for pertinent ROS. All other ROS negative.    Physical Exam: Blood pressure (!) 150/100, pulse 76, temperature 98.7 F (37.1 C), temperature source Oral, resp. rate 18, weight 238 lb 3.4 oz (108.1 kg), SpO2 99 %., Body mass index is 38.45 kg/m. General:  AAF. Appears in no acute distress. HEENT: Normocephalic, atraumatic, eyes without  discharge, sclera non-icteric, nares are without discharge. Bilateral auditory canals clear, TM's are without perforation, pearly grey and translucent with reflective cone of light bilaterally. Oral cavity moist, posterior pharynx without exudate, erythema, peritonsillar abscess. No tenderness with percussion to frontal or maxillary sinuses bilaterally.  Neck: Supple. No thyromegaly. No lymphadenopathy. Lungs: Clear bilaterally to auscultation without wheezes, rales, or rhonchi. Breathing is unlabored. Heart: Regular rhythm. No murmurs, rubs, or gallops. Msk:  Strength and tone normal for age. Extremities/Skin: Warm and dry. Neuro: Alert and oriented X  3. Moves all extremities spontaneously. Gait is normal. CNII-XII grossly in tact. Psych:  Responds to questions appropriately with a normal affect.     ASSESSMENT AND PLAN:  71 y.o. year old female with  1. Viral respiratory infection Discussed that this is likely a viral infection which would run its course and resolve on its own. In the interim, continue the Mucinex. If symptoms worsen or persist more than 7-10 days then call and I will add an antibiotic.   [address] [place], [place], [facility] 09/20/2016 12:25 PM

## 2016-09-21 ENCOUNTER — Telehealth: Payer: Self-pay | Admitting: Physician Assistant

## 2016-09-21 NOTE — Telephone Encounter (Signed)
Pt states she was seen yesterday with Madeline Wood, she said her glands underneath her tongue are swollen and hurt, wants to know if she needs to come in or if something can called in to pharmacy.

## 2016-09-21 NOTE — Telephone Encounter (Signed)
Pt  Was seen in the office 1-24 and states she is getting worse. Pt states her glands are swollen has real bad cough. Pt states she need to go bck to work and is req something  be called in to help.

## 2016-09-22 MED ORDER — AZITHROMYCIN 250 MG PO TABS: ORAL_TABLET | ORAL | 0 refills | Status: DC

## 2016-09-22 NOTE — Telephone Encounter (Signed)
Rx filled pt aware 

## 2016-09-22 NOTE — Telephone Encounter (Signed)
Azithromycin 250mg  Day 1: Take 2 daily. Days 2- 5: Take 1 daily. # 6 + 0

## 2016-09-26 DIAGNOSIS — M1711 Unilateral primary osteoarthritis, right knee: Secondary | ICD-10-CM | POA: Diagnosis not present

## 2016-10-03 DIAGNOSIS — M1711 Unilateral primary osteoarthritis, right knee: Secondary | ICD-10-CM | POA: Diagnosis not present

## 2016-10-05 ENCOUNTER — Ambulatory Visit (INDEPENDENT_AMBULATORY_CARE_PROVIDER_SITE_OTHER): Payer: Medicare Other | Admitting: *Deleted

## 2016-10-05 DIAGNOSIS — J309 Allergic rhinitis, unspecified: Secondary | ICD-10-CM

## 2016-10-10 DIAGNOSIS — M1711 Unilateral primary osteoarthritis, right knee: Secondary | ICD-10-CM | POA: Diagnosis not present

## 2016-10-12 ENCOUNTER — Ambulatory Visit (INDEPENDENT_AMBULATORY_CARE_PROVIDER_SITE_OTHER): Payer: Medicare Other | Admitting: *Deleted

## 2016-10-12 DIAGNOSIS — J309 Allergic rhinitis, unspecified: Secondary | ICD-10-CM

## 2016-10-19 ENCOUNTER — Ambulatory Visit (INDEPENDENT_AMBULATORY_CARE_PROVIDER_SITE_OTHER): Payer: Medicare Other | Admitting: *Deleted

## 2016-10-19 DIAGNOSIS — J309 Allergic rhinitis, unspecified: Secondary | ICD-10-CM | POA: Diagnosis not present

## 2016-10-20 ENCOUNTER — Other Ambulatory Visit: Payer: Self-pay | Admitting: Physician Assistant

## 2016-10-20 NOTE — Telephone Encounter (Signed)
Refill appropriate 

## 2016-10-25 ENCOUNTER — Ambulatory Visit (INDEPENDENT_AMBULATORY_CARE_PROVIDER_SITE_OTHER): Payer: Medicare Other | Admitting: *Deleted

## 2016-10-25 DIAGNOSIS — J309 Allergic rhinitis, unspecified: Secondary | ICD-10-CM

## 2016-11-01 ENCOUNTER — Ambulatory Visit (INDEPENDENT_AMBULATORY_CARE_PROVIDER_SITE_OTHER): Payer: Medicare Other | Admitting: *Deleted

## 2016-11-01 DIAGNOSIS — J309 Allergic rhinitis, unspecified: Secondary | ICD-10-CM

## 2016-11-10 DIAGNOSIS — M7122 Synovial cyst of popliteal space [Baker], left knee: Secondary | ICD-10-CM | POA: Diagnosis not present

## 2016-11-10 DIAGNOSIS — M7121 Synovial cyst of popliteal space [Baker], right knee: Secondary | ICD-10-CM | POA: Diagnosis not present

## 2016-11-10 DIAGNOSIS — M1712 Unilateral primary osteoarthritis, left knee: Secondary | ICD-10-CM | POA: Diagnosis not present

## 2016-11-10 DIAGNOSIS — M1711 Unilateral primary osteoarthritis, right knee: Secondary | ICD-10-CM | POA: Diagnosis not present

## 2016-11-13 ENCOUNTER — Encounter: Payer: Self-pay | Admitting: Physician Assistant

## 2016-11-13 ENCOUNTER — Ambulatory Visit (INDEPENDENT_AMBULATORY_CARE_PROVIDER_SITE_OTHER): Payer: Medicare Other | Admitting: Physician Assistant

## 2016-11-13 VITALS — BP 150/86 | HR 54 | Temp 98.1°F | Resp 16 | Wt 232.4 lb

## 2016-11-13 DIAGNOSIS — J988 Other specified respiratory disorders: Secondary | ICD-10-CM | POA: Diagnosis not present

## 2016-11-13 DIAGNOSIS — B9689 Other specified bacterial agents as the cause of diseases classified elsewhere: Secondary | ICD-10-CM

## 2016-11-13 MED ORDER — ALBUTEROL SULFATE HFA 108 (90 BASE) MCG/ACT IN AERS: 2.0000 | INHALATION_SPRAY | Freq: Four times a day (QID) | RESPIRATORY_TRACT | 0 refills | Status: DC | PRN

## 2016-11-13 MED ORDER — HYDROCODONE-HOMATROPINE 5-1.5 MG/5ML PO SYRP: 5.0000 mL | ORAL_SOLUTION | Freq: Three times a day (TID) | ORAL | 0 refills | Status: DC | PRN

## 2016-11-13 MED ORDER — PREDNISONE 20 MG PO TABS: ORAL_TABLET | ORAL | 0 refills | Status: DC

## 2016-11-13 MED ORDER — AZITHROMYCIN 250 MG PO TABS: ORAL_TABLET | ORAL | 0 refills | Status: DC

## 2016-11-13 NOTE — Progress Notes (Signed)
Patient ID: Madeline Wood MRN: [id number], DOB: 06/22/1946, 71 y.o. Date of Encounter: 11/13/2016, 1:57 PM    Chief Complaint:  Chief Complaint  Patient presents with  . Nasal Congestion    x5days  . Cough  . Wheezing     HPI: 71 y.o. year old female presents with above.   Says the symptoms started Thursday which was 5 days ago. Having cough and chest congestion and feels like she is wheezing some. Has not used any albuterol. When asked about this she says that she doesn't even know if she has an inhaler right now. Has some nasal congestion but mostly chest congestion and cough. No sore throat. No fevers or chills.     Home Meds:   Outpatient Medications Prior to Visit  Medication Sig Dispense Refill  . ALPRAZolam (XANAX) 1 MG tablet TAKE 1 TABLET BY MOUTH TWICE A DAY 60 tablet 2  . aspirin 81 MG tablet Take 81 mg by mouth daily.      [name] atorvastatin (LIPITOR) 80 MG tablet Take 1 tablet (80 mg total) by mouth daily. 90 tablet 3  . EPIPEN 2-PAK 0.3 MG/0.3ML SOAJ injection USE AS DIRECTED FOR SEVERE ALLERGIC REACTION 2 Device 0  . fexofenadine (ALLEGRA) 180 MG tablet Take 1 tablet (180 mg total) by mouth daily. 30 tablet 5  . fish oil-omega-3 fatty acids 1000 MG capsule Take 1 g by mouth. Takes occasionally    . fluticasone (FLONASE) 50 MCG/ACT nasal spray Place 1 spray into both nostrils 2 (two) times daily. 48 g 5  . losartan (COZAAR) 50 MG tablet TAKE 1 TABLET BY MOUTH DAILY 90 tablet 2  . meloxicam (MOBIC) 7.5 MG tablet Take 7.5 mg by mouth daily.    . metoprolol (LOPRESSOR) 50 MG tablet TAKE 1 TABLET BY MOUTH IN THE MORNING AND 1&1/2 IN THE EVENING 90 tablet 5  . Misc Natural Products (GLUCOSAMINE CHOND COMPLEX/MSM PO) Take by mouth. Occasionally    . Multiple Vitamin (MULTIVITAMIN) tablet Take 1 tablet by mouth. occasionally    . nitroGLYCERIN (NITROSTAT) 0.4 MG SL tablet Place 1 tablet (0.4 mg total) under the tongue every 5 (five) minutes as needed for chest pain. 25  tablet 3  . pantoprazole (PROTONIX) 40 MG tablet TAKE 1 TABLET EVERY DAY 90 tablet 3  . traMADol (ULTRAM) 50 MG tablet Take 50 mg by mouth as needed.    . triamterene-hydrochlorothiazide (MAXZIDE-25) 37.5-25 MG tablet TAKE 1 TABLET BY MOUTH EVERY DAY 90 tablet 2  . zolpidem (AMBIEN) 10 MG tablet TAKE 1 TABLET AT BEDTIME AS NEEDED 30 tablet 3  . albuterol (PROVENTIL HFA;VENTOLIN HFA) 108 (90 Base) MCG/ACT inhaler Inhale 2 puffs into the lungs every 6 (six) hours as needed for wheezing or shortness of breath. 1 Inhaler 0  . azithromycin (ZITHROMAX) 250 MG tablet Day 1: Take 2 daily. Days 2-5: Take 1 daily. (Patient not taking: Reported on 09/20/2016) 6 tablet 0  . azithromycin (ZITHROMAX) 250 MG tablet Day 1 take 2 tablets. Days 2-5 take 1 tablet daily 6 tablet 0   No facility-administered medications prior to visit.     Allergies:  Allergies  Allergen Reactions  . Ciprofloxacin   . Citalopram Hydrobromide   . Escitalopram Oxalate   . Paroxetine   . Polysporin [Bacitracin-Polymyxin B]       Review of Systems: See HPI for pertinent ROS. All other ROS negative.    Physical Exam: Blood pressure (!) 150/86, pulse (!) 54, temperature 98.1 F (  36.7 C), temperature source Oral, resp. rate 16, weight 232 lb 6.4 oz (105.4 kg), SpO2 99 %., Body mass index is 37.51 kg/m. General:  WNWD AAF. Appears in no acute distress. HEENT: Normocephalic, atraumatic, eyes without discharge, sclera non-icteric, nares are without discharge. Bilateral auditory canals clear, TM's are without perforation, pearly grey and translucent with reflective cone of light bilaterally. Oral cavity moist, posterior pharynx without exudate, erythema, peritonsillar abscess.  Neck: Supple. No thyromegaly. No lymphadenopathy. Lungs: She has mild wheezing throughout bilaterally. Heart: Regular rhythm. No murmurs, rubs, or gallops. Msk:  Strength and tone normal for age. Extremities/Skin: Warm and dry.  Neuro: Alert and oriented  X 3. Moves all extremities spontaneously. Gait is normal. CNII-XII grossly in tact. Psych:  Responds to questions appropriately with a normal affect.     ASSESSMENT AND PLAN:  71 y.o. year old female with  1. Bacterial respiratory infection She is to get medications filled and take her first dose of antibiotic and prednisone as soon as possible.. Albuterol inhaler as needed for wheezing. During the day can use Mucinex DM as an expectorant and at night can use the Hycodan as cough suppressant so she can get some sleep. F/U If symptoms worsen or if they do not resolve upon completion of abx and prednisone. - azithromycin (ZITHROMAX) 250 MG tablet; Day 1: Take 2 daily. Days 2 - 5: Take 1 daily.  Dispense: 6 tablet; Refill: 0 - HYDROcodone-homatropine (HYCODAN) 5-1.5 MG/5ML syrup; Take 5 mLs by mouth every 8 (eight) hours as needed for cough.  Dispense: 120 mL; Refill: 0 - albuterol (PROVENTIL HFA;VENTOLIN HFA) 108 (90 Base) MCG/ACT inhaler; Inhale 2 puffs into the lungs every 6 (six) hours as needed for wheezing or shortness of breath.  Dispense: 1 Inhaler; Refill: 0 - predniSONE (DELTASONE) 20 MG tablet; Take 2 daily for 4 days then 1 daily for 2 days  Dispense: 10 tablet; Refill: 0   Signed, [address] [place], [place], [facility] 11/13/2016 1:57 PM

## 2016-11-20 ENCOUNTER — Other Ambulatory Visit: Payer: Self-pay

## 2016-11-20 MED ORDER — FLUTICASONE PROPIONATE 50 MCG/ACT NA SUSP
1.0000 | Freq: Two times a day (BID) | NASAL | 0 refills | Status: DC
Start: 2016-11-20 — End: 2018-09-13

## 2016-11-22 ENCOUNTER — Ambulatory Visit: Payer: Medicare Other | Admitting: Physician Assistant

## 2016-11-22 ENCOUNTER — Other Ambulatory Visit: Payer: Self-pay

## 2016-11-22 MED ORDER — METOPROLOL TARTRATE 50 MG PO TABS: ORAL_TABLET | ORAL | 5 refills | Status: DC

## 2016-11-23 ENCOUNTER — Ambulatory Visit (INDEPENDENT_AMBULATORY_CARE_PROVIDER_SITE_OTHER): Payer: Medicare Other | Admitting: *Deleted

## 2016-11-23 DIAGNOSIS — J309 Allergic rhinitis, unspecified: Secondary | ICD-10-CM | POA: Diagnosis not present

## 2016-11-27 ENCOUNTER — Encounter: Payer: Self-pay | Admitting: Internal Medicine

## 2016-11-27 ENCOUNTER — Ambulatory Visit (INDEPENDENT_AMBULATORY_CARE_PROVIDER_SITE_OTHER): Payer: Medicare Other | Admitting: Physician Assistant

## 2016-11-27 ENCOUNTER — Encounter: Payer: Self-pay | Admitting: Physician Assistant

## 2016-11-27 VITALS — BP 134/78 | HR 69 | Temp 98.0°F | Resp 18 | Wt 236.6 lb

## 2016-11-27 DIAGNOSIS — B9689 Other specified bacterial agents as the cause of diseases classified elsewhere: Secondary | ICD-10-CM

## 2016-11-27 DIAGNOSIS — J988 Other specified respiratory disorders: Secondary | ICD-10-CM | POA: Diagnosis not present

## 2016-11-27 DIAGNOSIS — J209 Acute bronchitis, unspecified: Secondary | ICD-10-CM

## 2016-11-27 MED ORDER — HYDROCODONE-HOMATROPINE 5-1.5 MG/5ML PO SYRP: 5.0000 mL | ORAL_SOLUTION | Freq: Three times a day (TID) | ORAL | 0 refills | Status: DC | PRN

## 2016-11-27 MED ORDER — DOXYCYCLINE HYCLATE 100 MG PO TABS: 100.0000 mg | ORAL_TABLET | Freq: Two times a day (BID) | ORAL | 0 refills | Status: DC

## 2016-11-27 MED ORDER — PREDNISONE 20 MG PO TABS: ORAL_TABLET | ORAL | 0 refills | Status: DC

## 2016-11-27 NOTE — Progress Notes (Signed)
Patient ID: DEVONIA FARRO MRN: [id number], DOB: [birth year], 71 y.o. Date of Encounter: 11/27/2016, 12:49 PM    Chief Complaint:  Chief Complaint  Patient presents with  . chest congestion  . Cough     HPI: 71 y.o. year old female presents with above.  Reviewed her office note with me from 11/13/16. She reported having cough and chest congestion. Prescribed Azithromycin, albuterol and prednisone taper.  Today she reports that the symptoms decreased while she was on the medicine but once she completed the medicine, the symptoms came back. Says that she is having chest congestion and cough. No congestion in her head or nose. No fevers or chills. No additional concerns to address.     Home Meds:   Outpatient Medications Prior to Visit  Medication Sig Dispense Refill  . albuterol (PROVENTIL HFA;VENTOLIN HFA) 108 (90 Base) MCG/ACT inhaler Inhale 2 puffs into the lungs every 6 (six) hours as needed for wheezing or shortness of breath. 1 Inhaler 0  . ALPRAZolam (XANAX) 1 MG tablet TAKE 1 TABLET BY MOUTH TWICE A DAY 60 tablet 2  . aspirin 81 MG tablet Take 81 mg by mouth daily.      [name] atorvastatin (LIPITOR) 80 MG tablet Take 1 tablet (80 mg total) by mouth daily. 90 tablet 3  . EPIPEN 2-PAK 0.3 MG/0.3ML SOAJ injection USE AS DIRECTED FOR SEVERE ALLERGIC REACTION 2 Device 0  . fexofenadine (ALLEGRA) 180 MG tablet Take 1 tablet (180 mg total) by mouth daily. 30 tablet 5  . fish oil-omega-3 fatty acids 1000 MG capsule Take 1 g by mouth. Takes occasionally    . fluticasone (FLONASE) 50 MCG/ACT nasal spray Place 1 spray into both nostrils 2 (two) times daily. 48 g 0  . losartan (COZAAR) 50 MG tablet TAKE 1 TABLET BY MOUTH DAILY 90 tablet 2  . meloxicam (MOBIC) 7.5 MG tablet Take 7.5 mg by mouth daily.    . metoprolol (LOPRESSOR) 50 MG tablet TAKE 1 TABLET BY MOUTH IN THE MORNING AND 1&1/2 IN THE EVENING 90 tablet 5  . Misc Natural Products (GLUCOSAMINE CHOND COMPLEX/MSM PO) Take by mouth.  Occasionally    . Multiple Vitamin (MULTIVITAMIN) tablet Take 1 tablet by mouth. occasionally    . nitroGLYCERIN (NITROSTAT) 0.4 MG SL tablet Place 1 tablet (0.4 mg total) under the tongue every 5 (five) minutes as needed for chest pain. 25 tablet 3  . pantoprazole (PROTONIX) 40 MG tablet TAKE 1 TABLET EVERY DAY 90 tablet 3  . traMADol (ULTRAM) 50 MG tablet Take 50 mg by mouth as needed.    . triamterene-hydrochlorothiazide (MAXZIDE-25) 37.5-25 MG tablet TAKE 1 TABLET BY MOUTH EVERY DAY 90 tablet 2  . zolpidem (AMBIEN) 10 MG tablet TAKE 1 TABLET AT BEDTIME AS NEEDED 30 tablet 3  . HYDROcodone-homatropine (HYCODAN) 5-1.5 MG/5ML syrup Take 5 mLs by mouth every 8 (eight) hours as needed for cough. 120 mL 0  . azithromycin (ZITHROMAX) 250 MG tablet Day 1: Take 2 daily. Days 2 - 5: Take 1 daily. 6 tablet 0  . predniSONE (DELTASONE) 20 MG tablet Take 2 daily for 4 days then 1 daily for 2 days 10 tablet 0   No facility-administered medications prior to visit.     Allergies:  Allergies  Allergen Reactions  . Ciprofloxacin   . Citalopram Hydrobromide   . Escitalopram Oxalate   . Paroxetine   . Polysporin [Bacitracin-Polymyxin B]       Review of Systems: See HPI for pertinent  ROS. All other ROS negative.    Physical Exam: Blood pressure 134/78, pulse 69, temperature 98 F (36.7 C), temperature source Oral, resp. rate 18, weight 236 lb 9.6 oz (107.3 kg), SpO2 99 %., Body mass index is 38.19 kg/m. General:  Obese AAF.  Appears in no acute distress. HEENT: Normocephalic, atraumatic, eyes without discharge, sclera non-icteric, nares are without discharge. Bilateral auditory canals clear, TM's are without perforation, pearly grey and translucent with reflective cone of light bilaterally. Oral cavity moist, posterior pharynx without exudate, erythema, peritonsillar abscess.  Neck: Supple. No thyromegaly. No lymphadenopathy. Lungs: Slight wheeze scattered throughout bilaterally. Otherwise clear  with good breath sounds, good air movement.  Heart: Regular rhythm. No murmurs, rubs, or gallops. Msk:  Strength and tone normal for age. Extremities/Skin: Warm and dry. Neuro: Alert and oriented X 3. Moves all extremities spontaneously. Gait is normal. CNII-XII grossly in tact. Psych:  Responds to questions appropriately with a normal affect.     ASSESSMENT AND PLAN:  71 y.o. year old female with  1. Acute bronchitis, unspecified organism She is to take the doxycycline and prednisone as directed. Also to use albuterol inhaler 2 puffs every 4 hours as needed for wheezing. She requests refill on cough syrup so this is Rxed as well. - doxycycline (VIBRA-TABS) 100 MG tablet; Take 1 tablet (100 mg total) by mouth 2 (two) times daily.  Dispense: 20 tablet; Refill: 0 - predniSONE (DELTASONE) 20 MG tablet; Take 2 daily for 3 days then 1 daily for 3 days  Dispense: 9 tablet; Refill: 0  2. Bacterial respiratory infection - HYDROcodone-homatropine (HYCODAN) 5-1.5 MG/5ML syrup; Take 5 mLs by mouth every 8 (eight) hours as needed for cough.  Dispense: 120 mL; Refill: 0   Signed, [address] [place], [place], [facility] 11/27/2016 12:49 PM

## 2016-12-04 ENCOUNTER — Telehealth: Payer: Self-pay | Admitting: Physician Assistant

## 2016-12-04 MED ORDER — FLUCONAZOLE 150 MG PO TABS: 150.0000 mg | ORAL_TABLET | Freq: Once | ORAL | 0 refills | Status: AC

## 2016-12-04 NOTE — Telephone Encounter (Signed)
Pt called and states that she is having a yeast infection from the antibiotic we put her on wants to know if we can call in diflucan for yeast. Please call back

## 2016-12-04 NOTE — Telephone Encounter (Signed)
Per office protocol RX sent to pharmacy patient aware

## 2016-12-11 ENCOUNTER — Ambulatory Visit: Payer: Medicare Other | Admitting: Physician Assistant

## 2016-12-14 ENCOUNTER — Ambulatory Visit (INDEPENDENT_AMBULATORY_CARE_PROVIDER_SITE_OTHER): Payer: Medicare Other

## 2016-12-14 DIAGNOSIS — J309 Allergic rhinitis, unspecified: Secondary | ICD-10-CM

## 2017-01-01 DIAGNOSIS — Z1231 Encounter for screening mammogram for malignant neoplasm of breast: Secondary | ICD-10-CM | POA: Diagnosis not present

## 2017-01-01 LAB — HM MAMMOGRAPHY

## 2017-01-04 ENCOUNTER — Encounter: Payer: Self-pay | Admitting: Physician Assistant

## 2017-01-04 ENCOUNTER — Ambulatory Visit (INDEPENDENT_AMBULATORY_CARE_PROVIDER_SITE_OTHER): Payer: Medicare Other | Admitting: Physician Assistant

## 2017-01-04 VITALS — BP 130/84 | HR 62 | Temp 98.0°F | Resp 14 | Wt 231.0 lb

## 2017-01-04 DIAGNOSIS — K219 Gastro-esophageal reflux disease without esophagitis: Secondary | ICD-10-CM

## 2017-01-04 DIAGNOSIS — I10 Essential (primary) hypertension: Secondary | ICD-10-CM | POA: Diagnosis not present

## 2017-01-04 DIAGNOSIS — G4700 Insomnia, unspecified: Secondary | ICD-10-CM

## 2017-01-04 DIAGNOSIS — E785 Hyperlipidemia, unspecified: Secondary | ICD-10-CM

## 2017-01-04 DIAGNOSIS — E119 Type 2 diabetes mellitus without complications: Secondary | ICD-10-CM | POA: Diagnosis not present

## 2017-01-04 DIAGNOSIS — I2510 Atherosclerotic heart disease of native coronary artery without angina pectoris: Secondary | ICD-10-CM | POA: Diagnosis not present

## 2017-01-04 DIAGNOSIS — F419 Anxiety disorder, unspecified: Secondary | ICD-10-CM

## 2017-01-04 LAB — COMPLETE METABOLIC PANEL WITH GFR
ALT: 11 U/L (ref 6–29)
AST: 23 U/L (ref 10–35)
Albumin: 3.7 g/dL (ref 3.6–5.1)
Alkaline Phosphatase: 36 U/L (ref 33–130)
BUN: 15 mg/dL (ref 7–25)
CO2: 26 mmol/L (ref 20–31)
Calcium: 9.5 mg/dL (ref 8.6–10.4)
Chloride: 101 mmol/L (ref 98–110)
Creat: 0.79 mg/dL (ref 0.60–0.93)
GFR, Est African American: 87 mL/min (ref >=60)
GFR, Est Non African American: 76 mL/min (ref >=60)
Glucose, Bld: 99 mg/dL (ref 70–99)
Potassium: 3.5 mmol/L (ref 3.5–5.3)
Sodium: 138 mmol/L (ref 135–146)
Total Bilirubin: 0.7 mg/dL (ref 0.2–1.2)
Total Protein: 6.5 g/dL (ref 6.1–8.1)

## 2017-01-04 LAB — LIPID PANEL
Cholesterol: 165 mg/dL (ref <200)
HDL: 78 mg/dL (ref >50)
LDL Cholesterol: 75 mg/dL (ref <100)
Total CHOL/HDL Ratio: 2.1 Ratio (ref <5.0)
Triglycerides: 59 mg/dL (ref <150)
VLDL: 12 mg/dL (ref <30)

## 2017-01-04 MED ORDER — ALPRAZOLAM 1 MG PO TABS: 1.0000 mg | ORAL_TABLET | Freq: Two times a day (BID) | ORAL | 2 refills | Status: DC

## 2017-01-04 NOTE — Progress Notes (Signed)
Patient ID: Madeline Wood MRN: [id number], DOB: 04/18/1946, 71 y.o. Date of Encounter: 01/04/2017,      List the Names of Other Physician/Practitioners you currently use: Dr. Budd Palmer Allergy Specialist Cardiologist    Chief Complaint: Routine 6 month f/u OV  HPI: 71 y.o. y/o female  here for routine 6 month f/u OV.  She is taking all medications as directed and is having no adverse effects. She is taking Lipitor. No myalgias or other adverse effects. She is taking blood pressure medications. No lightheadedness or other adverse effects. She is taking Plavix as directed. No bleeding. She is taking medications for GERD and is controlling those symptoms.  At [place] 04/2016--"I noted that we had on her problem list gout but that she is on no medications for this. Asked if she had any recent symptoms that may have been from a gout flare. He states that yes just a couple of weeks ago her left big toe was really painful for a couple of days. Says that prior to that it up in years since a flare." At that OV--checked Uric Acid Level--it came back < 6  She has follow-up appointment with orthopedics regarding her knee. She has continued follow-up with Cardiology.  She has no additional complaints or concerns today.  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 She says that when she is not busy working she is kept busy by family members. Says that she has one brother who is very ill that she is constantly helping to care for and get to his appointments etc.   Review of Systems: Consitutional: No fever, chills, fatigue, night sweats, lymphadenopathy. No significant/unexplained weight changes. Eyes: No visual changes, eye redness, or discharge. ENT/Mouth: No ear pain, sore throat, nasal drainage, or sinus pain. Cardiovascular: No chest pressure,heaviness, tightness or squeezing, even with exertion. No increased shortness of breath or dyspnea on exertion.No palpitations, edema,  orthopnea, PND. Respiratory: No cough, hemoptysis, SOB, or wheezing. Gastrointestinal: No anorexia, dysphagia, reflux, pain, nausea, vomiting, hematemesis, diarrhea, constipation, BRBPR, or melena. Breast: No mass, nodules, bulging, or retraction. No skin changes or inflammation. No nipple discharge. No lymphadenopathy. Genitourinary: No dysuria, hematuria, incontinence, vaginal discharge, pruritis, burning, abnormal bleeding, or pain. Musculoskeletal: See HPI Skin: No rash, pruritis, or concerning lesions. Neurological: No headache, dizziness, syncope, seizures, tremors, memory loss, coordination problems, or paresthesias. Psychological: No anxiety, depression, hallucinations, SI/HI. Endocrine: No polydipsia, polyphagia, polyuria, or known diabetes.No increased fatigue. No palpitations/rapid heart rate. No significant/unexplained weight change. All other systems were reviewed and are otherwise negative.  Past Medical History:  Diagnosis Date  . 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 Anxiety   . CAD (coronary artery disease)    stent of distal RCA in 2006  . Diabetes mellitus without complication ([place])   . Diverticulosis   . GERD (gastroesophageal reflux disease)   . Gout   . Hiatal hernia   . History of nuclear stress test 03/2011   negative lexiscan myoview; normal perfusion  . Hyperlipidemia   . Hypertension   . Knee pain   . Obesity   . Positive TB test   . Venous insufficiency      Past Surgical History:  Procedure Laterality Date  . ABDOMINAL HYSTERECTOMY    . BACK SURGERY  2012  . BREAST LUMPECTOMY    . CORONARY ANGIOPLASTY WITH STENT PLACEMENT  06/29/2005   Taxus 2.5x57mm DES to distal RCA (Dr. Tami Ribas)  . TRANSTHORACIC ECHOCARDIOGRAM  12/20/2012   EF 50-55%, normal  systolic function, mild hypokinesis of inf myocardium; calcified MV annulua, LA & RA mildly dilated    Home Meds:  Outpatient Medications Prior to Visit  Medication Sig Dispense Refill  . albuterol (PROVENTIL  HFA;VENTOLIN HFA) 108 (90 Base) MCG/ACT inhaler Inhale 2 puffs into the lungs every 6 (six) hours as needed for wheezing or shortness of breath. 1 Inhaler 0  . ALPRAZolam (XANAX) 1 MG tablet TAKE 1 TABLET BY MOUTH TWICE A DAY 60 tablet 2  . aspirin 81 MG tablet Take 81 mg by mouth daily.      [name] atorvastatin (LIPITOR) 80 MG tablet Take 1 tablet (80 mg total) by mouth daily. 90 tablet 3  . EPIPEN 2-PAK 0.3 MG/0.3ML SOAJ injection USE AS DIRECTED FOR SEVERE ALLERGIC REACTION 2 Device 0  . fexofenadine (ALLEGRA) 180 MG tablet Take 1 tablet (180 mg total) by mouth daily. 30 tablet 5  . fish oil-omega-3 fatty acids 1000 MG capsule Take 1 g by mouth. Takes occasionally    . fluticasone (FLONASE) 50 MCG/ACT nasal spray Place 1 spray into both nostrils 2 (two) times daily. 48 g 0  . HYDROcodone-homatropine (HYCODAN) 5-1.5 MG/5ML syrup Take 5 mLs by mouth every 8 (eight) hours as needed for cough. 120 mL 0  . losartan (COZAAR) 50 MG tablet TAKE 1 TABLET BY MOUTH DAILY 90 tablet 2  . meloxicam (MOBIC) 7.5 MG tablet Take 7.5 mg by mouth daily.    . metoprolol (LOPRESSOR) 50 MG tablet TAKE 1 TABLET BY MOUTH IN THE MORNING AND 1&1/2 IN THE EVENING 90 tablet 5  . Misc Natural Products (GLUCOSAMINE CHOND COMPLEX/MSM PO) Take by mouth. Occasionally    . Multiple Vitamin (MULTIVITAMIN) tablet Take 1 tablet by mouth. occasionally    . nitroGLYCERIN (NITROSTAT) 0.4 MG SL tablet Place 1 tablet (0.4 mg total) under the tongue every 5 (five) minutes as needed for chest pain. 25 tablet 3  . pantoprazole (PROTONIX) 40 MG tablet TAKE 1 TABLET EVERY DAY 90 tablet 3  . traMADol (ULTRAM) 50 MG tablet Take 50 mg by mouth as needed.    . triamterene-hydrochlorothiazide (MAXZIDE-25) 37.5-25 MG tablet TAKE 1 TABLET BY MOUTH EVERY DAY 90 tablet 2  . zolpidem (AMBIEN) 10 MG tablet TAKE 1 TABLET AT BEDTIME AS NEEDED 30 tablet 3  . doxycycline (VIBRA-TABS) 100 MG tablet Take 1 tablet (100 mg total) by mouth 2 (two) times daily. 20  tablet 0  . predniSONE (DELTASONE) 20 MG tablet Take 2 daily for 3 days then 1 daily for 3 days 9 tablet 0   No facility-administered medications prior to visit.     Allergies:  Allergies  Allergen Reactions  . Ciprofloxacin   . Citalopram Hydrobromide   . Escitalopram Oxalate   . Paroxetine   . Polysporin [Bacitracin-Polymyxin B]     Social History   Social History  . Marital status: Single    Spouse name: N/A  . Number of children: 1  . 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 Smoking status: Former Smoker    Quit date: 12/20/2001  . Smokeless tobacco: Never Used  . Alcohol use No  . Drug use: No  . Sexual activity: Not on file   Other Topics Concern  . Not on file   Social History Narrative  . No narrative on file    Family History  Problem Relation Age of Onset  . Diabetes Sister   .  Pancreatic cancer Brother   . Ovarian cancer Mother   . Other Father        homicide  . Heart attack Brother        x2  . Hypertension Brother   . Hypertension Sister     Physical Exam: Blood pressure 130/84, pulse 62, temperature 98 F (36.7 C), temperature source Oral, resp. rate 14, weight 231 lb (104.8 kg), SpO2 99 %., Body mass index is 37.28 kg/m. General: Obese AAF. Appears in no acute distress. Neck: Supple. Trachea midline. No thyromegaly. Full ROM. No lymphadenopathy.No Carotid Bruits. Lungs: Clear to auscultation bilaterally without wheezes, rales, or rhonchi. Breathing is of normal effort and unlabored. Cardiovascular: RRR with S1 S2. No murmurs, rubs, or gallops. Distal pulses 2+ symmetrically. No carotid or abdominal bruits. Abdomen: Soft, non-tender, non-distended with normoactive bowel sounds. No hepatosplenomegaly or masses. No rebound/guarding. No CVA tenderness. No hernias.  Musculoskeletal: Full range of motion and 5/5 strength throughout.  Skin: Warm and moist without erythema, ecchymosis, wounds,  or rash. She does have skin lesion on left side of face---papule--same color as surrounding skin. Neuro: A+Ox3. CN II-XII grossly intact. Moves all extremities spontaneously. Full sensation throughout. Normal gait.  Psych:  Responds to questions appropriately with a normal affect. Diabetic foot exam: Inspection is normal. 2+ dorsalis pedis and posterior tibial pulses bilaterally. Sensation is intact    Assessment/Plan:  71 y.o. y/o female here for :   Insomnia At OV 07/2014  reported that she was able to fall asleep, but even with the Xanax, has difficulty staying asleep. 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 Said she was on regular sleep/wake cycle but still having insomnia. Active during day.   Informed not to take xanax and ambien together.  At that visit prescribed Ambien 10 mg #15+1 refill. At [place] 01/2015--she states that the Ambien does work well for her when she needs it. Causes no adverse effects. However see you says that she does not use it unless she really needs it. Says that she still has a few pills left from the prescription given at visit 07/2014 01/04/2017: Cont Ambien prn  Atherosclerosis of native coronary artery of native heart without angina pectoris 01/04/2017: Managed by cardiology  On Plavix--Checked CBC 04/2016  Gastroesophageal reflux disease, esophagitis presence not specified 01/04/2017: Symptoms controlled with current med.   History of colonic polyps See Below  Essential hypertension 01/04/2017: Blood pressure at goal. Continue current medications. Check lab to monitor.  Gout without tophus, unspecified cause, unspecified chronicity, unspecified site 01/04/2017---reviewed that 04/2016--Uric Acid level < 6 -- and has had very minimal flare---therefore on no med for this  Hyperlipidemia 01/04/2017: On Lipitor. Check labs to monitor. 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 04/2016--reports that she is still seeing allergist and gets allergy shots routinely.  Anxiety 01/04/2017: Stable/Controlled with current medication. Cont current medication.  Obesity 01/04/2017: She has been educated regarding low cholesterol low carbohydrate diet. Has also been educated regarding proper exercise but this is limited by her knee pain.  Hiatal hernia 01/04/2017: She sees Dr. Fuller Plan for GI.  Diabetes mellitus without complication 9/76/7341: - Hemoglobin A1c  - Microalbumin, urine----was checked 05/24/2016  So far this has been controlled without medications. Eye exam May 2016 Foot exam normal   Bilateral leg edema 01/04/2017: Controlled with  current medications.  Insomnia 01/04/2017: Controlled with current medications.   She has had Medicare physical. See that note for preventive care update.    Regular office visit 6 months or sooner if needed   SignedOlean Ree [place], [place], [facility] 01/04/2017 9:31 AM

## 2017-01-05 ENCOUNTER — Encounter: Payer: Self-pay | Admitting: Family Medicine

## 2017-01-05 LAB — HEMOGLOBIN A1C
Hgb A1c MFr Bld: 5.9 % — ABNORMAL HIGH (ref <5.7)
Mean Plasma Glucose: 123 mg/dL

## 2017-01-10 ENCOUNTER — Encounter: Payer: Self-pay | Admitting: *Deleted

## 2017-01-10 DIAGNOSIS — J301 Allergic rhinitis due to pollen: Secondary | ICD-10-CM | POA: Diagnosis not present

## 2017-01-10 NOTE — Progress Notes (Signed)
Maintenance vials made  

## 2017-01-11 DIAGNOSIS — J3089 Other allergic rhinitis: Secondary | ICD-10-CM | POA: Diagnosis not present

## 2017-01-12 ENCOUNTER — Ambulatory Visit (INDEPENDENT_AMBULATORY_CARE_PROVIDER_SITE_OTHER): Payer: Medicare Other

## 2017-01-12 DIAGNOSIS — J309 Allergic rhinitis, unspecified: Secondary | ICD-10-CM

## 2017-01-30 ENCOUNTER — Ambulatory Visit (INDEPENDENT_AMBULATORY_CARE_PROVIDER_SITE_OTHER): Payer: Medicare Other | Admitting: *Deleted

## 2017-01-30 DIAGNOSIS — J309 Allergic rhinitis, unspecified: Secondary | ICD-10-CM

## 2017-02-13 DIAGNOSIS — M1712 Unilateral primary osteoarthritis, left knee: Secondary | ICD-10-CM | POA: Diagnosis not present

## 2017-02-13 DIAGNOSIS — M1711 Unilateral primary osteoarthritis, right knee: Secondary | ICD-10-CM | POA: Diagnosis not present

## 2017-02-27 DIAGNOSIS — L720 Epidermal cyst: Secondary | ICD-10-CM | POA: Diagnosis not present

## 2017-03-01 ENCOUNTER — Ambulatory Visit (INDEPENDENT_AMBULATORY_CARE_PROVIDER_SITE_OTHER): Payer: Medicare Other | Admitting: *Deleted

## 2017-03-01 DIAGNOSIS — J309 Allergic rhinitis, unspecified: Secondary | ICD-10-CM

## 2017-03-08 DIAGNOSIS — M1711 Unilateral primary osteoarthritis, right knee: Secondary | ICD-10-CM | POA: Diagnosis not present

## 2017-03-12 ENCOUNTER — Other Ambulatory Visit: Payer: Self-pay | Admitting: Physician Assistant

## 2017-03-12 NOTE — Telephone Encounter (Signed)
ok 

## 2017-03-12 NOTE — Telephone Encounter (Signed)
Last OV 9/27 Last refill 06/28/2016 Ok to refill?

## 2017-03-13 NOTE — Telephone Encounter (Signed)
Rx called in to pharmacy. 

## 2017-03-15 DIAGNOSIS — M1711 Unilateral primary osteoarthritis, right knee: Secondary | ICD-10-CM | POA: Diagnosis not present

## 2017-03-16 DIAGNOSIS — M545 Low back pain: Secondary | ICD-10-CM | POA: Diagnosis not present

## 2017-03-20 ENCOUNTER — Other Ambulatory Visit: Payer: Self-pay | Admitting: Internal Medicine

## 2017-03-27 DIAGNOSIS — M1711 Unilateral primary osteoarthritis, right knee: Secondary | ICD-10-CM | POA: Diagnosis not present

## 2017-03-29 DIAGNOSIS — M545 Low back pain: Secondary | ICD-10-CM | POA: Diagnosis not present

## 2017-04-03 ENCOUNTER — Ambulatory Visit (INDEPENDENT_AMBULATORY_CARE_PROVIDER_SITE_OTHER): Payer: Medicare Other | Admitting: *Deleted

## 2017-04-03 DIAGNOSIS — J309 Allergic rhinitis, unspecified: Secondary | ICD-10-CM | POA: Diagnosis not present

## 2017-04-19 DIAGNOSIS — M1711 Unilateral primary osteoarthritis, right knee: Secondary | ICD-10-CM | POA: Diagnosis not present

## 2017-04-19 DIAGNOSIS — M1712 Unilateral primary osteoarthritis, left knee: Secondary | ICD-10-CM | POA: Diagnosis not present

## 2017-04-26 ENCOUNTER — Ambulatory Visit (INDEPENDENT_AMBULATORY_CARE_PROVIDER_SITE_OTHER): Payer: Medicare Other | Admitting: *Deleted

## 2017-04-26 ENCOUNTER — Other Ambulatory Visit: Payer: Self-pay | Admitting: Physician Assistant

## 2017-04-26 DIAGNOSIS — J309 Allergic rhinitis, unspecified: Secondary | ICD-10-CM | POA: Diagnosis not present

## 2017-04-26 NOTE — Telephone Encounter (Signed)
Rx called in to pharmacy. 

## 2017-04-26 NOTE — Telephone Encounter (Signed)
Approved # [id number] 2 

## 2017-04-26 NOTE — Telephone Encounter (Signed)
Last OV 01/04/2017 Last refill 01/04/2017 Ok to refill?

## 2017-05-09 ENCOUNTER — Other Ambulatory Visit: Payer: Self-pay | Admitting: Internal Medicine

## 2017-05-10 NOTE — Telephone Encounter (Signed)
Rx(s) sent to pharmacy electronically.  

## 2017-05-11 ENCOUNTER — Ambulatory Visit (INDEPENDENT_AMBULATORY_CARE_PROVIDER_SITE_OTHER): Payer: Medicare Other

## 2017-05-11 DIAGNOSIS — J309 Allergic rhinitis, unspecified: Secondary | ICD-10-CM

## 2017-05-27 ENCOUNTER — Other Ambulatory Visit: Payer: Self-pay | Admitting: Physician Assistant

## 2017-05-28 ENCOUNTER — Ambulatory Visit (INDEPENDENT_AMBULATORY_CARE_PROVIDER_SITE_OTHER): Payer: Medicare Other | Admitting: *Deleted

## 2017-05-28 DIAGNOSIS — J309 Allergic rhinitis, unspecified: Secondary | ICD-10-CM

## 2017-05-28 NOTE — Telephone Encounter (Signed)
Refill appropriate and filled per protocol. 

## 2017-06-04 ENCOUNTER — Ambulatory Visit (INDEPENDENT_AMBULATORY_CARE_PROVIDER_SITE_OTHER): Payer: Medicare Other | Admitting: Family Medicine

## 2017-06-04 DIAGNOSIS — Z23 Encounter for immunization: Secondary | ICD-10-CM | POA: Diagnosis not present

## 2017-06-06 ENCOUNTER — Ambulatory Visit (INDEPENDENT_AMBULATORY_CARE_PROVIDER_SITE_OTHER): Payer: Medicare Other | Admitting: *Deleted

## 2017-06-06 DIAGNOSIS — J309 Allergic rhinitis, unspecified: Secondary | ICD-10-CM

## 2017-06-11 ENCOUNTER — Ambulatory Visit (INDEPENDENT_AMBULATORY_CARE_PROVIDER_SITE_OTHER): Payer: Medicare Other | Admitting: Physician Assistant

## 2017-06-11 ENCOUNTER — Telehealth: Payer: Self-pay | Admitting: *Deleted

## 2017-06-11 ENCOUNTER — Encounter: Payer: Self-pay | Admitting: Physician Assistant

## 2017-06-11 VITALS — BP 130/68 | HR 80 | Temp 98.1°F | Resp 16 | Ht 66.0 in | Wt 233.0 lb

## 2017-06-11 DIAGNOSIS — M542 Cervicalgia: Secondary | ICD-10-CM | POA: Diagnosis not present

## 2017-06-11 DIAGNOSIS — M549 Dorsalgia, unspecified: Secondary | ICD-10-CM | POA: Diagnosis not present

## 2017-06-11 MED ORDER — CYCLOBENZAPRINE HCL 5 MG PO TABS: 5.0000 mg | ORAL_TABLET | Freq: Three times a day (TID) | ORAL | 0 refills | Status: DC | PRN

## 2017-06-11 MED ORDER — MELOXICAM 7.5 MG PO TABS: 7.5000 mg | ORAL_TABLET | Freq: Every day | ORAL | 0 refills | Status: DC

## 2017-06-11 MED ORDER — LEVALBUTEROL TARTRATE 45 MCG/ACT IN AERO: 2.0000 | INHALATION_SPRAY | RESPIRATORY_TRACT | 12 refills | Status: DC | PRN

## 2017-06-11 MED ORDER — HYDROCODONE-ACETAMINOPHEN 5-325 MG PO TABS: 1.0000 | ORAL_TABLET | Freq: Four times a day (QID) | ORAL | 0 refills | Status: DC | PRN

## 2017-06-11 NOTE — Telephone Encounter (Signed)
She has been experiencing palpitations and heart racing when she uses albuterol. She does have cardiac history. Xopenex does not cause increase heart rate.

## 2017-06-11 NOTE — Telephone Encounter (Signed)
Received fax requesting alternative medication to Levalbuterol.   Insurance prefers Technical sales engineer.   Please advise.
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
## 2017-06-13 ENCOUNTER — Ambulatory Visit (INDEPENDENT_AMBULATORY_CARE_PROVIDER_SITE_OTHER): Payer: Medicare Other | Admitting: *Deleted

## 2017-06-13 DIAGNOSIS — J309 Allergic rhinitis, unspecified: Secondary | ICD-10-CM | POA: Diagnosis not present

## 2017-06-21 ENCOUNTER — Ambulatory Visit (INDEPENDENT_AMBULATORY_CARE_PROVIDER_SITE_OTHER): Payer: Medicare Other | Admitting: *Deleted

## 2017-06-21 DIAGNOSIS — J309 Allergic rhinitis, unspecified: Secondary | ICD-10-CM

## 2017-06-22 ENCOUNTER — Other Ambulatory Visit: Payer: Self-pay | Admitting: *Deleted

## 2017-06-25 ENCOUNTER — Ambulatory Visit: Payer: Medicare Other | Admitting: Physician Assistant

## 2017-06-25 NOTE — Telephone Encounter (Signed)
Attempted PA, but insurance is incorrect.

## 2017-06-28 ENCOUNTER — Ambulatory Visit (INDEPENDENT_AMBULATORY_CARE_PROVIDER_SITE_OTHER): Payer: Medicare Other

## 2017-06-28 ENCOUNTER — Telehealth: Payer: Self-pay

## 2017-06-28 DIAGNOSIS — J309 Allergic rhinitis, unspecified: Secondary | ICD-10-CM | POA: Diagnosis not present

## 2017-06-28 NOTE — Telephone Encounter (Signed)
Prior authorization for levalbuterol tartrate HFA was denied will attempt to appeal it

## 2017-07-03 ENCOUNTER — Ambulatory Visit: Payer: Medicare Other | Admitting: Internal Medicine

## 2017-07-08 ENCOUNTER — Other Ambulatory Visit: Payer: Self-pay | Admitting: Physician Assistant

## 2017-07-08 DIAGNOSIS — M542 Cervicalgia: Secondary | ICD-10-CM

## 2017-07-08 DIAGNOSIS — M549 Dorsalgia, unspecified: Secondary | ICD-10-CM

## 2017-07-13 ENCOUNTER — Other Ambulatory Visit: Payer: Self-pay

## 2017-07-13 MED ORDER — LEVALBUTEROL TARTRATE 45 MCG/ACT IN AERO: 2.0000 | INHALATION_SPRAY | RESPIRATORY_TRACT | 12 refills | Status: DC | PRN

## 2017-07-13 NOTE — Telephone Encounter (Signed)
levalbuterol tartrate hfa approved [id number] 06/25/2017-08/27/2018

## 2017-07-18 ENCOUNTER — Ambulatory Visit (INDEPENDENT_AMBULATORY_CARE_PROVIDER_SITE_OTHER): Payer: Medicare Other

## 2017-07-18 DIAGNOSIS — J309 Allergic rhinitis, unspecified: Secondary | ICD-10-CM | POA: Diagnosis not present

## 2017-07-23 ENCOUNTER — Ambulatory Visit: Payer: Medicare Other | Admitting: Internal Medicine

## 2017-07-23 ENCOUNTER — Encounter: Payer: Self-pay | Admitting: Internal Medicine

## 2017-07-23 VITALS — BP 146/84 | HR 65 | Ht 66.0 in | Wt 233.0 lb

## 2017-07-23 DIAGNOSIS — I2510 Atherosclerotic heart disease of native coronary artery without angina pectoris: Secondary | ICD-10-CM | POA: Diagnosis not present

## 2017-07-23 DIAGNOSIS — E782 Mixed hyperlipidemia: Secondary | ICD-10-CM | POA: Diagnosis not present

## 2017-07-23 DIAGNOSIS — I10 Essential (primary) hypertension: Secondary | ICD-10-CM

## 2017-07-23 NOTE — Patient Instructions (Signed)
Dr Hilty recommends that you schedule a follow-up appointment in 12 months. You will receive a reminder letter in the mail two months in advance. If you don't receive a letter, please call our office to schedule the follow-up appointment.  If you need a refill on your cardiac medications before your next appointment, please call your pharmacy. 

## 2017-07-23 NOTE — Progress Notes (Signed)
OFFICE NOTE  Chief Complaint:  No complaints  Primary Care Physician: Orlena Sheldon, PA-C  HPI:  Madeline Wood is a 71 year old female patient formerly followed by Dr. Rollene Fare, who works as a Photographer. She has a history of coronary disease and had a drug-eluting stent placed to the distal RCA in 2006. She had a negative Myoview in 2012. Just has a history of obesity, hypertension and dyslipidemia. In 2012 underwent back surgery by Dr. Lynann Bologna and has had a good recovery from that.  Recently she had some problems with low blood pressure and had a decrease in her losartan dose to 50 mg once daily which has helped. She has however been having some lower extremity swelling.  When she last saw Dr. Rollene Fare in April 2014, he recommended starting her Dyazide to one full tablet daily. She did not start that given her concern about the change in her blood pressure. It is noted however her blood pressure is slightly higher today 150/84. She is asymptomatic, denying any chest pain, shortness of breath, palpitations, presyncope or syncopal symptoms.  Mrs. Mungia returns today for followup.  She denies any chest pain or worsening shortness of breath. Recent laboratory work shows good control of her cholesterol.  She is currently on atorvastatin 80 mg daily and recently had that he had discontinued due to excellent cholesterol control. She is also on aspirin and Plavix as well as losartan and Lopressor.  06/01/2016  Mrs. Cayson returns today for follow-up. She's done very well over he past year. r worsening shortness of breath. Weight is stable and I've encouraged her to continue to work on lowering it. Cholesterol control is been excellent. She has been on long-standing aspirin and Plavix however her stent was in 2006. Is not clear that there is an ongoing indication for dual antiplatelet therapy.Blood pressure is well controlled. EKG shows normal sinus rhythm at 62.  07/23/2017  Mrs. Leory Plowman  returns today for follow-up.  She denies any new symptoms such as chest pain or worsening shortness of breath.  She had repeat lipid testing and May which showed an LDL C of 75, down slightly from her most recent testing.  Her hemoglobin A1c also is improved at 5.9.  This is mostly with dietary changes, what weight has been fairly stable.  She is not on any diabetic medications.  Reports a decrease in exercise recently and is committed to restarting that.  PMHx:  Past Medical History:  Diagnosis Date  . 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 Anxiety   . CAD (coronary artery disease)    stent of distal RCA in 2006  . Diabetes mellitus without complication ([place])   . Diverticulosis   . GERD (gastroesophageal reflux disease)   . Gout   . Hiatal hernia   . History of nuclear stress test 03/2011   negative lexiscan myoview; normal perfusion  . Hyperlipidemia   . Hypertension   . Knee pain   . Obesity   . Positive TB test   . Venous insufficiency     Past Surgical History:  Procedure Laterality Date  . ABDOMINAL HYSTERECTOMY    . BACK SURGERY  2012  . BREAST LUMPECTOMY    . CORONARY ANGIOPLASTY WITH STENT PLACEMENT  06/29/2005   [place]us 2.5x77mm DES to distal RCA (Dr. Tami Ribas)  . TRANSTHORACIC ECHOCARDIOGRAM  12/20/2012   EF 41-96%, normal systolic function, mild hypokinesis of inf myocardium; calcified MV annulua, LA & RA mildly dilated    FAMHx:  Family History  Problem Relation Age of Onset  . Diabetes Sister   . Pancreatic cancer Brother   . Ovarian cancer Mother   . Other Father        homicide  . Heart attack Brother        x2  . Hypertension Brother   . Hypertension Sister     SOCHx:   reports that she quit smoking about 15 years ago. she has never used smokeless tobacco. She reports that she does not drink alcohol or use drugs.  ALLERGIES:  Allergies  Allergen Reactions  . Ciprofloxacin   . Citalopram Hydrobromide   . Escitalopram Oxalate   . Paroxetine   . Polysporin  [Bacitracin-Polymyxin B]     ROS: Pertinent items noted in HPI and remainder of comprehensive ROS otherwise negative.  HOME MEDS: Current Outpatient Medications  Medication Sig Dispense Refill  . ALPRAZolam (XANAX) 1 MG tablet TAKE 1 TABLET BY MOUTH TWICE A DAY 60 tablet 2  . aspirin 81 MG tablet Take 81 mg by mouth daily.      [name] atorvastatin (LIPITOR) 80 MG tablet Take 1 tablet (80 mg total) by mouth daily. 90 tablet 3  . cyclobenzaprine (FLEXERIL) 5 MG tablet Take 1 tablet (5 mg total) by mouth 3 (three) times daily as needed for muscle spasms. 30 tablet 0  . EPIPEN 2-PAK 0.3 MG/0.3ML SOAJ injection USE AS DIRECTED FOR SEVERE ALLERGIC REACTION 2 Device 0  . fexofenadine (ALLEGRA) 180 MG tablet Take 1 tablet (180 mg total) by mouth daily. 30 tablet 5  . fish oil-omega-3 fatty acids 1000 MG capsule Take 1 g by mouth. Takes occasionally    . fluticasone (FLONASE) 50 MCG/ACT nasal spray Place 1 spray into both nostrils 2 (two) times daily. 48 g 0  . HYDROcodone-acetaminophen (NORCO/VICODIN) 5-325 MG tablet Take 1 tablet by mouth every 6 (six) hours as needed. 30 tablet 0  . levalbuterol (XOPENEX HFA) 45 MCG/ACT inhaler Inhale 2 puffs every 4 (four) hours as needed into the lungs for wheezing. 1 Inhaler 12  . losartan (COZAAR) 50 MG tablet Take 1 tablet (50 mg total) by mouth daily. <PLEASE MAKE APPOINTMENT FOR REFILLS> 90 tablet 0  . meloxicam (MOBIC) 7.5 MG tablet TAKE 1 TABLET BY MOUTH EVERY DAY 30 tablet 0  . metoprolol tartrate (LOPRESSOR) 50 MG tablet TAKE 1 TABLET BY MOUTH IN THE MORNING AND 1&1/2 IN THE EVENING 90 tablet 5  . Misc Natural Products (GLUCOSAMINE CHOND COMPLEX/MSM PO) Take by mouth. Occasionally    . Multiple Vitamin (MULTIVITAMIN) tablet Take 1 tablet by mouth. occasionally    . nitroGLYCERIN (NITROSTAT) 0.4 MG SL tablet Place 1 tablet (0.4 mg total) under the tongue every 5 (five) minutes as needed for chest pain. 25 tablet 3  . pantoprazole (PROTONIX) 40 MG tablet TAKE  1 TABLET EVERY DAY 90 tablet 3  . triamterene-hydrochlorothiazide (MAXZIDE-25) 37.5-25 MG tablet TAKE 1 TABLET BY MOUTH EVERY DAY 90 tablet 2  . zolpidem (AMBIEN) 10 MG tablet TAKE 1 TABLET BY MOUTH AT BEDTIME AS NEEDED 30 tablet 0   No current facility-administered medications for this visit.     LABS/IMAGING: No results found for this or any previous visit (from the past 48 hour(s)). No results found.  VITALS: BP (!) 146/84   Pulse 65   Ht 5\' 6"  (1.676 m)   Wt 233 lb (105.7 kg)   BMI 37.61 kg/m   EXAM: General appearance: alert and no distress Neck: no adenopathy, no  carotid bruit, no JVD, supple, symmetrical, trachea midline and thyroid not enlarged, symmetric, no tenderness/mass/nodules Lungs: clear to auscultation bilaterally Heart: regular rate and rhythm, S1, S2 normal, no murmur, click, rub or gallop Abdomen: soft, non-tender; bowel sounds normal; no masses,  no organomegaly and obese Extremities: edema 1+ bilaterally and no venous stasis changes or varicose veins Pulses: 2+ and symmetric Skin: Skin color, texture, turgor normal. No rashes or lesions Neurologic: Grossly normal Psych: Pleasant mood, affect  EKG: Ectopic atrial rhythm at 65, nonspecific T wave changes-personally reviewed  ASSESSMENT: 1. Coronary disease status post PCI to the distal RCA in 2006 (DES) 2. Obesity 3. Hypertension- at goal  4. Dyslipidemia  PLAN: 1.   Mrs. Koppen is doing well.  Her Restoril is well controlled.  Hemoglobin A1c is nondiabetic.  Weight has been stable if not up a few pounds.  Blood pressure is well controlled.  She is encouraged to continue to work on increasing aerobic exercise and light strength training to help with weight reduction.  Follow-up annually or sooner as necessary.  Pixie Casino, MD, [place], [place] Director of the [facility] Attending Cardiologist  Direct Dial:  [phone]  Fax: [phone]  Website:  [email or web address]  Pixie Casino 07/23/2017, 2:29 PM

## 2017-07-26 ENCOUNTER — Other Ambulatory Visit: Payer: Self-pay | Admitting: Physician Assistant

## 2017-07-26 ENCOUNTER — Other Ambulatory Visit: Payer: Self-pay | Admitting: Internal Medicine

## 2017-07-26 NOTE — Telephone Encounter (Signed)
Medication refilled per protocol. 

## 2017-07-27 ENCOUNTER — Emergency Department (HOSPITAL_COMMUNITY)
Admission: EM | Admit: 2017-07-27 | Discharge: 2017-07-28 | Disposition: A | Discharge status: Home / Self Care | Payer: Medicare Other | Attending: Emergency Medicine | Admitting: Emergency Medicine

## 2017-07-27 ENCOUNTER — Encounter (HOSPITAL_COMMUNITY): Payer: Self-pay

## 2017-07-27 ENCOUNTER — Emergency Department (HOSPITAL_COMMUNITY): Payer: Medicare Other

## 2017-07-27 ENCOUNTER — Other Ambulatory Visit: Payer: Self-pay

## 2017-07-27 DIAGNOSIS — Y929 Unspecified place or not applicable: Secondary | ICD-10-CM | POA: Insufficient documentation

## 2017-07-27 DIAGNOSIS — T148XXA Other injury of unspecified body region, initial encounter: Secondary | ICD-10-CM | POA: Diagnosis not present

## 2017-07-27 DIAGNOSIS — S43004A Unspecified dislocation of right shoulder joint, initial encounter: Secondary | ICD-10-CM | POA: Diagnosis not present

## 2017-07-27 DIAGNOSIS — W01198A Fall on same level from slipping, tripping and stumbling with subsequent striking against other object, initial encounter: Secondary | ICD-10-CM | POA: Insufficient documentation

## 2017-07-27 DIAGNOSIS — M25511 Pain in right shoulder: Secondary | ICD-10-CM | POA: Diagnosis not present

## 2017-07-27 DIAGNOSIS — Z7982 Long term (current) use of aspirin: Secondary | ICD-10-CM | POA: Diagnosis not present

## 2017-07-27 DIAGNOSIS — I10 Essential (primary) hypertension: Secondary | ICD-10-CM | POA: Insufficient documentation

## 2017-07-27 DIAGNOSIS — Z79899 Other long term (current) drug therapy: Secondary | ICD-10-CM | POA: Insufficient documentation

## 2017-07-27 DIAGNOSIS — I2510 Atherosclerotic heart disease of native coronary artery without angina pectoris: Secondary | ICD-10-CM | POA: Insufficient documentation

## 2017-07-27 DIAGNOSIS — E119 Type 2 diabetes mellitus without complications: Secondary | ICD-10-CM | POA: Insufficient documentation

## 2017-07-27 DIAGNOSIS — Y999 Unspecified external cause status: Secondary | ICD-10-CM | POA: Insufficient documentation

## 2017-07-27 DIAGNOSIS — S0990XA Unspecified injury of head, initial encounter: Secondary | ICD-10-CM | POA: Diagnosis present

## 2017-07-27 DIAGNOSIS — Y939 Activity, unspecified: Secondary | ICD-10-CM | POA: Diagnosis not present

## 2017-07-27 DIAGNOSIS — Z87891 Personal history of nicotine dependence: Secondary | ICD-10-CM | POA: Diagnosis not present

## 2017-07-27 DIAGNOSIS — R51 Headache: Secondary | ICD-10-CM | POA: Diagnosis not present

## 2017-07-27 DIAGNOSIS — S199XXA Unspecified injury of neck, initial encounter: Secondary | ICD-10-CM | POA: Diagnosis not present

## 2017-07-27 LAB — BASIC METABOLIC PANEL
Anion gap: 8 (ref 5–15)
BUN: 10 mg/dL (ref 6–20)
CO2: 28 mmol/L (ref 22–32)
Calcium: 9.4 mg/dL (ref 8.9–10.3)
Chloride: 93 mmol/L — ABNORMAL LOW (ref 101–111)
Creatinine, Ser: 0.78 mg/dL (ref 0.44–1.00)
GFR calc Af Amer: >60 mL/min (ref >60)
GFR calc non Af Amer: >60 mL/min (ref >60)
Glucose, Bld: 101 mg/dL — ABNORMAL HIGH (ref 65–99)
Potassium: 4.7 mmol/L (ref 3.5–5.1)
Sodium: 129 mmol/L — ABNORMAL LOW (ref 135–145)

## 2017-07-27 LAB — CBC
HCT: 39 % (ref 36.0–46.0)
Hemoglobin: 13.2 g/dL (ref 12.0–15.0)
MCH: 30.4 pg (ref 26.0–34.0)
MCHC: 33.8 g/dL (ref 30.0–36.0)
MCV: 89.9 fL (ref 78.0–100.0)
Platelets: 236 10*3/uL (ref 150–400)
RBC: 4.34 MIL/uL (ref 3.87–5.11)
RDW: 13.3 % (ref 11.5–15.5)
WBC: 7 10*3/uL (ref 4.0–10.5)

## 2017-07-27 MED ORDER — SODIUM CHLORIDE 0.9 % IV BOLUS (SEPSIS)
1000.0000 mL | Freq: Once | INTRAVENOUS | Status: AC
  Administered 2017-07-27: 1000 mL via INTRAVENOUS

## 2017-07-27 MED ORDER — ACETAMINOPHEN 500 MG PO TABS
1000.0000 mg | ORAL_TABLET | Freq: Once | ORAL | Status: AC
  Administered 2017-07-28: 1000 mg via ORAL
  Filled 2017-07-27: qty 2

## 2017-07-27 MED ORDER — ONDANSETRON HCL 4 MG/2ML IJ SOLN
4.0000 mg | Freq: Once | INTRAMUSCULAR | Status: AC
  Administered 2017-07-27: 4 mg via INTRAVENOUS
  Filled 2017-07-27: qty 2

## 2017-07-27 MED ORDER — PROPOFOL 10 MG/ML IV BOLUS
INTRAVENOUS | Status: AC | PRN
  Administered 2017-07-27: 10 mg via INTRAVENOUS
  Administered 2017-07-27: 20 mg via INTRAVENOUS
  Administered 2017-07-27: 10 mg via INTRAVENOUS
  Administered 2017-07-27: 20 mg via INTRAVENOUS

## 2017-07-27 MED ORDER — SODIUM CHLORIDE 0.9 % IV SOLN
INTRAVENOUS | Status: AC | PRN
  Administered 2017-07-27: 1000 mL via INTRAVENOUS

## 2017-07-27 MED ORDER — ACETAMINOPHEN 325 MG PO TABS: 650.0000 mg | ORAL_TABLET | Freq: Four times a day (QID) | ORAL | 0 refills | Status: DC | PRN

## 2017-07-27 MED ORDER — MORPHINE SULFATE (PF) 4 MG/ML IV SOLN
4.0000 mg | Freq: Once | INTRAVENOUS | Status: AC
  Administered 2017-07-27: 4 mg via INTRAVENOUS
  Filled 2017-07-27: qty 1

## 2017-07-27 MED ORDER — MORPHINE SULFATE (PF) 2 MG/ML IV SOLN
2.0000 mg | Freq: Once | INTRAVENOUS | Status: AC
  Administered 2017-07-27: 2 mg via INTRAVENOUS
  Filled 2017-07-27: qty 1

## 2017-07-27 MED ORDER — FENTANYL CITRATE (PF) 100 MCG/2ML IJ SOLN
25.0000 ug | Freq: Once | INTRAMUSCULAR | Status: AC
  Administered 2017-07-27: 25 ug via INTRAVENOUS
  Filled 2017-07-27: qty 2

## 2017-07-27 MED ORDER — PROPOFOL 10 MG/ML IV BOLUS
0.5000 mg/kg | Freq: Once | INTRAVENOUS | Status: AC
  Administered 2017-07-27: 52.9 mg via INTRAVENOUS
  Filled 2017-07-27: qty 20

## 2017-07-27 NOTE — ED Provider Notes (Signed)
[facility] DEPT Provider Note   CSN: [id number] Arrival date & time: 07/27/17  1712     History   Chief Complaint Chief Complaint  Patient presents with  . Fall  . Shoulder Pain  . Neck Pain    HPI Madeline Wood is a 71 y.o. female presenting with right shoulder pain after a fall.  Patient states she tripped and fell backwards.  She denies dizziness or any symptoms that caused her to fall.  She fell, landing on her right shoulder and hitting her head against the ground.  She denies loss of consciousness.  She takes a baby aspirin daily.  She has not ambulated since.  She denies numbness or tingling.  She denies loss of bowel or bladder control.  She reports severe constant right shoulder pain worse with movement and palpation.  She has not taken anything for pain.  Nothing has made it better.  She reports some neck soreness, denies headache.  She denies vision changes, slurred speech, decreased concentration, nausea, vomiting.  She denies chest pain, shortness of breath, abdominal pain, or hip pain.  She denies injury elsewhere.  HPI  Past Medical History:  Diagnosis Date  . 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 Anxiety   . CAD (coronary artery disease)    stent of distal RCA in 2006  . Diabetes mellitus without complication ([place])   . Diverticulosis   . GERD (gastroesophageal reflux disease)   . Gout   . Hiatal hernia   . History of nuclear stress test 03/2011   negative lexiscan myoview; normal perfusion  . Hyperlipidemia   . Hypertension   . Knee pain   . Obesity   . Positive TB test   . Venous insufficiency     Patient Active Problem List   Diagnosis Date Noted  . Atherosclerosis of native coronary artery of native heart without angina pectoris 07/23/2017  . History of food allergy 01/05/2016  . Angioedema 11/17/2015  . Allergic rhinoconjunctivitis, seasonal and perennial 05/04/2015  . Insomnia 08/24/2014  . Bilateral leg edema 04/17/2014  .  Diabetes mellitus without complication ([place])   . Essential hypertension   . Gout   . Mixed hyperlipidemia   . 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 Anxiety   . Obesity   . Knee pain   . Positive TB test   . Hiatal hernia   . GERD (gastroesophageal reflux disease)   . Coronary atherosclerosis 06/06/2010  . GERD 06/06/2010  . History of colonic polyps 06/06/2010    Past Surgical History:  Procedure Laterality Date  . ABDOMINAL HYSTERECTOMY    . BACK SURGERY  2012  . BREAST LUMPECTOMY    . CORONARY ANGIOPLASTY WITH STENT PLACEMENT  06/29/2005   Taxus 2.5x79mm DES to distal RCA (Dr. Tami Ribas)  . TRANSTHORACIC ECHOCARDIOGRAM  12/20/2012   EF 37-62%, normal systolic function, mild hypokinesis of inf myocardium; calcified MV annulua, LA & RA mildly dilated    OB History    No data available       Home Medications    Prior to Admission medications   Medication Sig Start Date End Date Taking? Authorizing Provider  ALPRAZolam [name] 1 MG tablet TAKE 1 TABLET BY MOUTH TWICE A DAY 04/26/17  Yes Orlena Sheldon, PA-C  aspirin 81 MG tablet Take 81 mg by mouth daily.     Yes [provider]  atorvastatin (LIPITOR) 80 MG tablet TAKE 1 TABLET BY MOUTH EVERY DAY 07/27/17  Yes Hilty,  Nadean Corwin, MD  cyclobenzaprine (FLEXERIL) 5 MG tablet Take 1 tablet (5 mg total) by mouth 3 (three) times daily as needed for muscle spasms. 06/11/17  Yes Dena Billet B, PA-C  EPIPEN 2-PAK 0.3 MG/0.3ML SOAJ injection USE AS DIRECTED FOR SEVERE ALLERGIC REACTION 05/02/16  Yes Bobbitt, Sedalia Muta, MD  fexofenadine (ALLEGRA) 180 MG tablet Take 1 tablet (180 mg total) by mouth daily. 07/01/14  Yes Dena Billet B, PA-C  fish oil-omega-3 fatty acids 1000 MG capsule Take 1 g by mouth. Takes occasionally   Yes [provider]  fluticasone (FLONASE) 50 MCG/ACT nasal spray Place 1 spray into both nostrils 2 (two) times daily. 11/20/16  Yes Bobbitt, Sedalia Muta, MD  HYDROcodone-acetaminophen (NORCO/VICODIN) 5-325 MG tablet  Take 1 tablet by mouth every 6 (six) hours as needed. 06/11/17  Yes Orlena Sheldon, PA-C  levalbuterol (XOPENEX HFA) 45 MCG/ACT inhaler Inhale 2 puffs every 4 (four) hours as needed into the lungs for wheezing. 07/13/17  Yes Dena Billet B, PA-C  losartan (COZAAR) 50 MG tablet Take 1 tablet (50 mg total) by mouth daily. <PLEASE MAKE APPOINTMENT FOR REFILLS> 05/10/17  Yes Hilty, Nadean Corwin, MD  meloxicam (MOBIC) 7.5 MG tablet TAKE 1 TABLET BY MOUTH EVERY DAY 07/09/17  Yes Dena Billet B, PA-C  metoprolol tartrate (LOPRESSOR) 50 MG tablet TAKE 1 TABLET BY MOUTH IN THE MORNING AND 1&1/2 IN THE EVENING 03/20/17  Yes Hilty, Nadean Corwin, MD  Misc Natural Products (GLUCOSAMINE CHOND COMPLEX/MSM PO) Take by mouth. Occasionally   Yes [provider]  Multiple Vitamin (MULTIVITAMIN) tablet Take 1 tablet by mouth. occasionally   Yes [provider]  nitroGLYCERIN (NITROSTAT) 0.4 MG SL tablet Place 1 tablet (0.4 mg total) under the tongue every 5 (five) minutes as needed for chest pain. 05/20/15  Yes Pixie Casino, MD  pantoprazole (PROTONIX) 40 MG tablet TAKE 1 TABLET EVERY DAY 05/28/17  Yes Dena Billet B, PA-C  polyvinyl alcohol (LIQUIFILM TEARS) 1.4 % ophthalmic solution Place 1 drop into both eyes as needed for dry eyes.   Yes [provider]  triamterene-hydrochlorothiazide (MAXZIDE-25) 37.5-25 MG tablet TAKE 1 TABLET BY MOUTH EVERY DAY 07/26/17  Yes Dena Billet B, PA-C  zolpidem (AMBIEN) 10 MG tablet TAKE 1 TABLET BY MOUTH AT BEDTIME AS NEEDED 03/13/17  Yes [name], MD  acetaminophen (TYLENOL) 325 MG tablet Take 2 tablets (650 mg total) by mouth every 6 (six) hours as needed. 07/27/17   Lajune Perine, PA-C    Family History Family History  Problem Relation Age of Onset  . Diabetes Sister   . Pancreatic cancer Brother   . Ovarian cancer Mother   . Other Father        homicide  . Heart attack Brother        x2  . Hypertension Brother   . Hypertension Sister      Social History Social History   Tobacco Use  . Smoking status: Former Smoker    Last attempt to quit: 12/20/2001    Years since quitting: 15.6  . Smokeless tobacco: Never Used  Substance Use Topics  . Alcohol use: No    Alcohol/week: 0.0 oz  . Drug use: No     Allergies   Ciprofloxacin; Citalopram hydrobromide; Escitalopram oxalate; Paroxetine; and Polysporin [bacitracin-polymyxin b]   Review of Systems Review of Systems  Constitutional: Negative for chills and fever.  HENT: Negative for nosebleeds.   Eyes: Negative for visual disturbance.  Respiratory: Negative for shortness  of breath.   Cardiovascular: Negative for chest pain.  Gastrointestinal: Negative for abdominal pain, nausea and vomiting.  Genitourinary: Negative for pelvic pain.  Musculoskeletal: Positive for arthralgias and neck pain.  Skin: Negative for wound.  Neurological: Negative for dizziness, numbness and headaches.  Psychiatric/Behavioral: Negative for confusion.     Physical Exam Updated Vital Signs BP 134/76   Pulse 67   Temp 98.5 F (36.9 C) (Oral)   Resp 12   SpO2 99%   Physical Exam  Constitutional: She is oriented to person, place, and time. She appears well-developed and well-nourished. No distress.  HENT:  Head: Normocephalic and atraumatic.  No TTP of the head. No obvious lacerations or hematomas.   Eyes: EOM are normal. Pupils are equal, round, and reactive to light.  Neck: Normal range of motion.  Pt moving her head without pain. No TTP of midline c-spine.   Cardiovascular: Normal rate, regular rhythm and intact distal pulses.  Pulmonary/Chest: Effort normal and breath sounds normal. No respiratory distress. She has no wheezes. She has no rales.  Abdominal: Soft. She exhibits no distension. There is no tenderness.  Musculoskeletal: She exhibits tenderness.  Pt R arm in sling. TTP of shoulder joint, and palpable shoulder deformity. No TTP of R elbow or wrist. No TTP of LUE. No  TTP of back or midline spine. Sensation intact x4. Radial pulses equal bilaterally. Soft compartments.   Neurological: She is alert and oriented to person, place, and time. No sensory deficit. GCS eye subscore is 4. GCS verbal subscore is 5. GCS motor subscore is 6.  Skin: Skin is warm. No rash noted.  Psychiatric: She has a normal mood and affect.  Nursing note and vitals reviewed.    ED Treatments / Results  Labs (all labs ordered are listed, but only abnormal results are displayed) Labs Reviewed  BASIC METABOLIC PANEL - Abnormal; Notable for the following components:      Result Value   Sodium 129 (*)    Chloride 93 (*)    Glucose, Bld 101 (*)    All other components within normal limits  CBC    EKG  EKG Interpretation None       Radiology Dg Shoulder Right  Result Date: 07/27/2017 CLINICAL DATA:  Dislocation, postreduction. EXAM: RIGHT SHOULDER - 2+ VIEW COMPARISON:  Pre reduction radiographs earlier this day. FINDINGS: Prior anterior shoulder dislocation has been reduced, alignment is now anatomic. Inferior glenoid irregularity is again suspected, not as well visualized on the current exam. The acromioclavicular joint is congruent. IMPRESSION: Reduction of prior shoulder dislocation. Possible bony [place] from the inferior glenoid. Electronically Signed   By: Jeb Levering M.D.   On: 07/27/2017 23:31   Dg Shoulder Right  Result Date: 07/27/2017 CLINICAL DATA:  Right shoulder pain following a fall. EXAM: RIGHT SHOULDER - 2+ VIEW COMPARISON:  None. FINDINGS: Anterior dislocation of the humeral head relative to the glenoid. There is also a possible fracture of the inferior or anterior aspect of the glenoid with a curvilinear fracture fragment or loose body. Also noted are 2 level corticated loose bodies superior to the humeral head. Mild greater tuberosity spur formation. Marked inferior acromial spur formation. IMPRESSION: Anterior shoulder dislocation with a possible  fracture of the inferior or anterior aspect of the glenoid. Electronically Signed   By: Claudie Revering M.D.   On: 07/27/2017 20:06   Ct Head Wo Contrast  Result Date: 07/27/2017 CLINICAL DATA:  Tripped and fell backwards, hit head pain at the  shoulder EXAM: CT HEAD WITHOUT CONTRAST CT CERVICAL SPINE WITHOUT CONTRAST TECHNIQUE: Multidetector CT imaging of the head and cervical spine was performed following the standard protocol without intravenous contrast. Multiplanar CT image reconstructions of the cervical spine were also generated. COMPARISON:  12/28/2006 FINDINGS: CT HEAD FINDINGS Brain: No evidence of acute infarction, hemorrhage, hydrocephalus, extra-axial collection or mass lesion/mass effect. Vascular: No hyperdense vessels. Scattered calcifications at the carotid siphons. Skull: Normal. Negative for fracture or focal lesion. Sinuses/Orbits: No acute finding. Other: None CT CERVICAL SPINE FINDINGS Alignment: Trace anterolisthesis of C4 on C5. Straightening of the cervical spine. Facet alignment is maintained Skull base and vertebrae: No acute fracture. No primary bone lesion or focal pathologic process. Soft tissues and spinal canal: No prevertebral fluid or swelling. No visible canal hematoma. Disc levels:  Mild degenerative changes at C4-C5 and C5-C6. Upper chest: Apical emphysema. 11 mm hypodense nodule in left lobe of thyroid. Other: None IMPRESSION: 1. No CT evidence for acute intracranial abnormality. 2. Trace anterolisthesis of C4 on C5.  No fracture is seen 3. Apical emphysema 4. 11 mm left lobe of thyroid hypodense nodule, correlation with nonemergent thyroid ultrasound if indicated. Electronically Signed   By: Donavan Foil M.D.   On: 07/27/2017 22:00   Ct Cervical Spine Wo Contrast  Result Date: 07/27/2017 CLINICAL DATA:  Tripped and fell backwards, hit head pain at the shoulder EXAM: CT HEAD WITHOUT CONTRAST CT CERVICAL SPINE WITHOUT CONTRAST TECHNIQUE: Multidetector CT imaging of the  head and cervical spine was performed following the standard protocol without intravenous contrast. Multiplanar CT image reconstructions of the cervical spine were also generated. COMPARISON:  12/28/2006 FINDINGS: CT HEAD FINDINGS Brain: No evidence of acute infarction, hemorrhage, hydrocephalus, extra-axial collection or mass lesion/mass effect. Vascular: No hyperdense vessels. Scattered calcifications at the carotid siphons. Skull: Normal. Negative for fracture or focal lesion. Sinuses/Orbits: No acute finding. Other: None CT CERVICAL SPINE FINDINGS Alignment: Trace anterolisthesis of C4 on C5. Straightening of the cervical spine. Facet alignment is maintained Skull base and vertebrae: No acute fracture. No primary bone lesion or focal pathologic process. Soft tissues and spinal canal: No prevertebral fluid or swelling. No visible canal hematoma. Disc levels:  Mild degenerative changes at C4-C5 and C5-C6. Upper chest: Apical emphysema. 11 mm hypodense nodule in left lobe of thyroid. Other: None IMPRESSION: 1. No CT evidence for acute intracranial abnormality. 2. Trace anterolisthesis of C4 on C5.  No fracture is seen 3. Apical emphysema 4. 11 mm left lobe of thyroid hypodense nodule, correlation with nonemergent thyroid ultrasound if indicated. Electronically Signed   By: Donavan Foil M.D.   On: 07/27/2017 22:00    Procedures Reduction of dislocation Date/Time: 07/27/2017 11:35 PM Performed by: Franchot Heidelberg, PA-C Authorized by: Franchot Heidelberg, PA-C  Consent: Verbal consent obtained. Written consent obtained. Risks and benefits: risks, benefits and alternatives were discussed Consent given by: patient Patient understanding: patient states understanding of the procedure being performed Patient consent: the patient's understanding of the procedure matches consent given Procedure consent: procedure consent matches procedure scheduled Relevant documents: relevant documents present and  verified Test results: test results available and properly labeled Imaging studies: imaging studies available Patient identity confirmed: verbally with patient Time out: Immediately prior to procedure a "time out" was called to verify the correct patient, procedure, equipment, support staff and site/side marked as required. Local anesthesia used: no  Anesthesia: Local anesthesia used: no  Sedation: Patient sedated: yes Sedation type: moderate (conscious) sedation Sedatives: propofol Analgesia: morphine and fentanyl  Sedation start date/time: 07/27/2017 10:43 PM Sedation end date/time: 07/27/2017 9:00 PM Vitals: Vital signs were monitored during sedation.  Patient tolerance: Patient tolerated the procedure well with no immediate complications    (including critical care time)  Medications Ordered in ED Medications  fentaNYL (SUBLIMAZE) injection 25 mcg (25 mcg Intravenous Given 07/27/17 1921)  ondansetron (ZOFRAN) injection 4 mg (4 mg Intravenous Given 07/27/17 1921)  sodium chloride 0.9 % bolus 1,000 mL (0 mLs Intravenous Stopped 07/28/17 0019)  morphine 4 MG/ML injection 4 mg (4 mg Intravenous Given 07/27/17 2103)  propofol (DIPRIVAN) 10 mg/mL bolus/IV push 52.9 mg (52.9 mg Intravenous Given 07/27/17 2243)  morphine 2 MG/ML injection 2 mg (2 mg Intravenous Given 07/27/17 2230)  0.9 %  sodium chloride infusion ( Intravenous Stopped 07/28/17 0023)  propofol (DIPRIVAN) 10 mg/mL bolus/IV push (10 mg Intravenous Given 07/27/17 2246)  acetaminophen (TYLENOL) tablet 1,000 mg (1,000 mg Oral Given 07/28/17 0023)     Initial Impression / Assessment and Plan / ED Course  I have reviewed the triage vital signs and the nursing notes.  Pertinent labs & imaging results that were available during my care of the patient were reviewed by me and considered in my medical decision making (see chart for details).     Patient presenting with right shoulder pain after mechanical fall.  No loss of  consciousness.  No obvious neurologic deficits.  As patient hit her head and has continued pain of the right shoulder, will obtain x-ray of right shoulder and CT head and neck.  Pain controlled with fentanyl.  Basic labs obtained.  Sodium low.  Patient has a history of hyponatremia.  Will give fluid bolus while she is in the ER.  X-ray shows dislocation with small fracture.  CT head and neck negative.  Discussed case with attending, Dr. Rogene Houston evaluated patient.  Shoulder reduced successfully.  Follow-up x-ray without abnormality.  Elbow x-ray was not able to be obtained initially due to shoulder dislocation and afterwards as patient is unable to move arm without risk of dislocation.  Clinically, elbow did not appear to be injured, and during reduction process, there was no abnormality, deformity, or crepitus of the elbow.  Doubt elbow injury.  Pain is resolved at this time, patient remains neurovascularly intact.  Patient to follow-up with orthopedics on Monday.  At this time, patient appears safe for discharge.  Return precautions given.  Patient states she understands and agrees to plan.   Final Clinical Impressions(s) / ED Diagnoses   Final diagnoses:  Dislocation of right shoulder joint, initial encounter    ED Discharge Orders        Ordered    acetaminophen (TYLENOL) 325 MG tablet  Every 6 hours PRN     07/27/17 2341       Franchot Heidelberg, PA-C 07/28/17 0036    Fredia Sorrow, MD 07/28/17 [phone]

## 2017-07-27 NOTE — ED Notes (Signed)
Pt transported to CT ?

## 2017-07-27 NOTE — ED Triage Notes (Signed)
Per EMS- Patient c/o tripping on pavement, falling backwards. Patient c/o right shoulder pain, and posterior neck pain. Towel placed around the neck by EMS. MAE. No obvious deformities or bruising noted.

## 2017-07-27 NOTE — Discharge Instructions (Signed)
It is important that you keep your arm in the sling.  Do not lift your arm above shoulder height.  If you use your arm, this will increase your risk of dislocation. Take Tylenol 3 times a day.  Continue to take your daily aspirin. Use ice as frequently as possible, 20 minutes at a time. Follow-up with the orthopedic doctor on Monday for further evaluation and management of your shoulder. Return to the emergency room if you develop fevers, chills, numbness, color change of your hand, worsening pain, or any new or worsening symptoms.

## 2017-07-27 NOTE — ED Provider Notes (Signed)
Physical Exam  BP (!) 147/85   Pulse (!) 35   Temp 98.5 F (36.9 C) (Oral)   Resp 15   SpO2 99%   Physical Exam   Details of physical exam listed below.  In addition patient's heart was regular rate and rhythm lungs were clear bilaterally.  Oxygen saturations were in the upper 90s.  Abdomen was soft and nontender.  Hips nontender lower extremities without any tenderness or deformity.  Palpation of the cervical spine without any posterior tenderness.  Palpation of the low back without any tenderness.    ED Course  .Sedation Date/Time: 07/27/2017 11:30 PM Performed by: Fredia Sorrow, MD Authorized by: Fredia Sorrow, MD   Consent:    Consent obtained:  Written   Consent given by:  Patient   Risks discussed:  Allergic reaction, prolonged hypoxia resulting in organ damage, prolonged sedation necessitating reversal, inadequate sedation, dysrhythmia, vomiting, nausea and respiratory compromise necessitating ventilatory assistance and intubation   Alternatives discussed:  Analgesia without sedation Universal protocol:    Procedure explained and questions answered to patient or proxy's satisfaction: yes     Relevant documents present and verified: yes     Test results available and properly labeled: yes     Imaging studies available: yes     Required blood products, implants, devices, and special equipment available: yes     Site/side marked: yes     Immediately prior to procedure a time out was called: yes     Patient identity confirmation method:  Verbally with patient Indications:    Procedure performed:  Dislocation reduction   Intended level of sedation:  Moderate (conscious sedation) Pre-sedation assessment:    Time since last food or drink:  6 hours   NPO status caution: urgency dictates proceeding with non-ideal NPO status     ASA classification: class 2 - patient with mild systemic disease     Neck mobility: normal     Mouth opening:  3 or more finger widths  Mallampati score:  I - soft palate, uvula, fauces, pillars visible   Pre-sedation assessments completed and reviewed: airway patency, cardiovascular function, hydration status, mental status, nausea/vomiting, pain level, respiratory function and temperature     Pre-sedation assessment completed:  07/27/2017 10:35 PM Immediate pre-procedure details:    Reassessment: Patient reassessed immediately prior to procedure     Reviewed: vital signs     Verified: bag valve mask available, emergency equipment available, intubation equipment available, IV patency confirmed, oxygen available and suction available   Procedure details (see MAR for exact dosages):    Preoxygenation:  Nasal cannula   Sedation:  Propofol   Intra-procedure monitoring:  Blood pressure monitoring, cardiac monitor, continuous capnometry, continuous pulse oximetry, frequent LOC assessments and frequent vital sign checks   Intra-procedure events: none     Intra-procedure management:  Supplemental oxygen   Total Provider sedation time (minutes):  30 Post-procedure details:    Post-sedation assessment completed:  07/27/2017 11:36 PM   Attendance: Constant attendance by certified staff until patient recovered     Recovery: Patient returned to pre-procedure baseline     Post-sedation assessments completed and reviewed: airway patency, mental status and respiratory function     Patient is stable for discharge or admission: yes     Patient tolerance:  Tolerated well, no immediate complications    MDM  Medical screening examination/treatment/procedure(s) were conducted as a shared visit with non-physician practitioner(s) and myself.  I personally evaluated the patient during the encounter.  EKG Interpretation None       Patient seen by me along with the physician assistant.  Patient had a fall earlier today she tripped over a shopping cart.  Patient fell backwards hit her head and landed on her right shoulder.  Patient with  complaint of right shoulder pain.  Head pain was 4 out of 10.  Shoulder pain was 8-9 out of 10.  Basic x-ray showed an anterior shoulder dislocation with some question of the glenoid fossa fracture fragment.  Patient CT head was negative.  The physician assistant did the relocation and I did the conscious sedation.  Procedure tab entered below for conscious sedation.  Follow-up x-ray showed successful relocation of the dislocation.  Patient placed in a shoulder immobilizer and has orthopedic follow-up available to her.  She will call on Monday and make an appointment.  Patient was sedated with propofol.  There were no complicating factors.  Patient's radial pulse and sensation in her fingers was intact prior to the procedure and it was intact after the procedure.  X-rays verified relocation of the shoulder dislocation.          Fredia Sorrow, MD 07/27/17 2337

## 2017-07-27 NOTE — ED Notes (Signed)
Bed: PJ12 Expected date:  Expected time:  Means of arrival:  Comments: TRI 5

## 2017-07-27 NOTE — ED Triage Notes (Signed)
Pt states that she tripped on the wheel of a shopping cart today. Pt states that she fell backwards and hit her head and landed rt shoulder Pt denies LOC. Pt reports 8/10 pain that rt shoulder that radiates to her head 4/10

## 2017-07-30 ENCOUNTER — Ambulatory Visit: Payer: Medicare Other | Admitting: Physician Assistant

## 2017-07-30 ENCOUNTER — Encounter: Payer: Self-pay | Admitting: Physician Assistant

## 2017-08-02 ENCOUNTER — Other Ambulatory Visit: Payer: Self-pay | Admitting: Internal Medicine

## 2017-08-02 DIAGNOSIS — M1712 Unilateral primary osteoarthritis, left knee: Secondary | ICD-10-CM | POA: Diagnosis not present

## 2017-08-02 DIAGNOSIS — M1711 Unilateral primary osteoarthritis, right knee: Secondary | ICD-10-CM | POA: Diagnosis not present

## 2017-08-12 ENCOUNTER — Other Ambulatory Visit: Payer: Self-pay | Admitting: Physician Assistant

## 2017-08-12 DIAGNOSIS — M549 Dorsalgia, unspecified: Secondary | ICD-10-CM

## 2017-08-12 DIAGNOSIS — M542 Cervicalgia: Secondary | ICD-10-CM

## 2017-08-13 NOTE — Telephone Encounter (Signed)
Refill appropriate 

## 2017-08-15 ENCOUNTER — Encounter: Payer: Self-pay | Admitting: Physician Assistant

## 2017-08-15 ENCOUNTER — Ambulatory Visit (INDEPENDENT_AMBULATORY_CARE_PROVIDER_SITE_OTHER): Payer: Medicare Other | Admitting: Physician Assistant

## 2017-08-15 VITALS — BP 136/88 | HR 70 | Temp 97.9°F | Resp 14 | Wt 231.6 lb

## 2017-08-15 DIAGNOSIS — E119 Type 2 diabetes mellitus without complications: Secondary | ICD-10-CM | POA: Diagnosis not present

## 2017-08-15 DIAGNOSIS — G4700 Insomnia, unspecified: Secondary | ICD-10-CM | POA: Diagnosis not present

## 2017-08-15 DIAGNOSIS — I10 Essential (primary) hypertension: Secondary | ICD-10-CM | POA: Diagnosis not present

## 2017-08-15 DIAGNOSIS — K219 Gastro-esophageal reflux disease without esophagitis: Secondary | ICD-10-CM

## 2017-08-15 DIAGNOSIS — E782 Mixed hyperlipidemia: Secondary | ICD-10-CM

## 2017-08-15 DIAGNOSIS — F419 Anxiety disorder, unspecified: Secondary | ICD-10-CM

## 2017-08-15 DIAGNOSIS — I2510 Atherosclerotic heart disease of native coronary artery without angina pectoris: Secondary | ICD-10-CM | POA: Diagnosis not present

## 2017-08-15 DIAGNOSIS — M1000 Idiopathic gout, unspecified site: Secondary | ICD-10-CM
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
## 2017-08-16 LAB — COMPLETE METABOLIC PANEL WITH GFR
AG Ratio: 1.4 (calc) (ref 1.0–2.5)
ALT: 12 U/L (ref 6–29)
AST: 23 U/L (ref 10–35)
Albumin: 3.7 g/dL (ref 3.6–5.1)
Alkaline phosphatase (APISO): 48 U/L (ref 33–130)
BUN: 10 mg/dL (ref 7–25)
CO2: 29 mmol/L (ref 20–32)
Calcium: 9.6 mg/dL (ref 8.6–10.4)
Chloride: 97 mmol/L — ABNORMAL LOW (ref 98–110)
Creat: 0.72 mg/dL (ref 0.60–0.93)
GFR, Est African American: 98 mL/min/{1.73_m2} (ref >=60)
GFR, Est Non African American: 84 mL/min/{1.73_m2} (ref >=60)
Globulin: 2.6 g/dL (calc) (ref 1.9–3.7)
Glucose, Bld: 99 mg/dL (ref 65–99)
Potassium: 3.8 mmol/L (ref 3.5–5.3)
Sodium: 135 mmol/L (ref 135–146)
Total Bilirubin: 0.8 mg/dL (ref 0.2–1.2)
Total Protein: 6.3 g/dL (ref 6.1–8.1)

## 2017-08-16 LAB — HEMOGLOBIN A1C
Hgb A1c MFr Bld: 6.2 % of total Hgb — ABNORMAL HIGH (ref <5.7)
Mean Plasma Glucose: 131 (calc)
eAG (mmol/L): 7.3 (calc)

## 2017-08-17 ENCOUNTER — Ambulatory Visit (INDEPENDENT_AMBULATORY_CARE_PROVIDER_SITE_OTHER): Payer: Medicare Other

## 2017-08-17 DIAGNOSIS — J309 Allergic rhinitis, unspecified: Secondary | ICD-10-CM

## 2017-08-24 DIAGNOSIS — M67911 Unspecified disorder of synovium and tendon, right shoulder: Secondary | ICD-10-CM | POA: Diagnosis not present

## 2017-08-29 ENCOUNTER — Telehealth: Payer: Self-pay

## 2017-08-29 MED ORDER — NYSTATIN 100000 UNIT/GM EX CREA: 1.0000 "application " | TOPICAL_CREAM | Freq: Two times a day (BID) | CUTANEOUS | 1 refills | Status: DC

## 2017-08-29 NOTE — Telephone Encounter (Signed)
Nystatin Cream Apply to affected area BID # 45 grams. + 1 refill.  Tell her to dry the skin there as good as possible.

## 2017-08-29 NOTE — Telephone Encounter (Signed)
rx filled message left for patient

## 2017-08-29 NOTE — Telephone Encounter (Signed)
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   Pls advise

## 2017-08-30 ENCOUNTER — Other Ambulatory Visit: Payer: Self-pay | Admitting: Orthopedic Surgery

## 2017-08-30 DIAGNOSIS — M25511 Pain in right shoulder: Secondary | ICD-10-CM

## 2017-09-03 ENCOUNTER — Ambulatory Visit
Admission: RE | Admit: 2017-09-03 | Discharge: 2017-09-03 | Disposition: A | Discharge status: Home / Self Care | Payer: Medicare Other | Source: Ambulatory Visit | Attending: Orthopedic Surgery | Admitting: Orthopedic Surgery

## 2017-09-03 DIAGNOSIS — S43004A Unspecified dislocation of right shoulder joint, initial encounter: Secondary | ICD-10-CM | POA: Diagnosis not present

## 2017-09-03 DIAGNOSIS — M25511 Pain in right shoulder: Secondary | ICD-10-CM

## 2017-09-05 ENCOUNTER — Encounter: Payer: Self-pay | Admitting: *Deleted

## 2017-09-05 ENCOUNTER — Other Ambulatory Visit: Payer: Self-pay | Admitting: Physician Assistant

## 2017-09-05 DIAGNOSIS — J3089 Other allergic rhinitis: Secondary | ICD-10-CM | POA: Diagnosis not present

## 2017-09-05 NOTE — Telephone Encounter (Signed)
Last OV 08/15/2017 Last refill 04/26/2017 Ok to refill?

## 2017-09-05 NOTE — Progress Notes (Signed)
VIALS MADE EXP: 09-05-18. HV

## 2017-09-05 NOTE — Telephone Encounter (Signed)
Approved # [id number] 2 

## 2017-09-06 ENCOUNTER — Other Ambulatory Visit: Payer: Self-pay | Admitting: Physician Assistant

## 2017-09-06 DIAGNOSIS — M67911 Unspecified disorder of synovium and tendon, right shoulder: Secondary | ICD-10-CM | POA: Diagnosis not present

## 2017-09-06 DIAGNOSIS — J309 Allergic rhinitis, unspecified: Secondary | ICD-10-CM | POA: Diagnosis not present

## 2017-09-06 NOTE — Telephone Encounter (Signed)
rx called into pharmacy

## 2017-09-07 NOTE — Telephone Encounter (Signed)
rx has already been filled this was called in 09/06/2017

## 2017-09-12 ENCOUNTER — Ambulatory Visit (INDEPENDENT_AMBULATORY_CARE_PROVIDER_SITE_OTHER): Payer: Medicare Other

## 2017-09-12 ENCOUNTER — Other Ambulatory Visit: Payer: Self-pay | Admitting: Allergy and Immunology

## 2017-09-12 ENCOUNTER — Other Ambulatory Visit: Payer: Self-pay

## 2017-09-12 DIAGNOSIS — J309 Allergic rhinitis, unspecified: Secondary | ICD-10-CM | POA: Diagnosis not present

## 2017-09-12 MED ORDER — EPINEPHRINE 0.3 MG/0.3ML IJ SOAJ: 0.3000 mg | Freq: Once | INTRAMUSCULAR | 0 refills | Status: AC

## 2017-09-12 NOTE — Telephone Encounter (Signed)
Patient is wondering if she can have another epi pen sent in to her pharmacy, [facility]. She has misplaced hers and she is on allergy shots. She stated she thinks its time for her to come in for a visit but she fell and hurt her shoulder and can not do a visit right now.  Please Advise

## 2017-09-12 NOTE — Telephone Encounter (Signed)
Prescription has been sent in for patient with notation to make OV.

## 2017-09-20 ENCOUNTER — Telehealth: Payer: Self-pay | Admitting: Internal Medicine

## 2017-09-20 NOTE — Telephone Encounter (Signed)
Spoke to patient . Informed patient to contact pharmacy - in regards to losartan  Recall.   will call patient back after discussing the US of the meloxicam. Patient states she does not use medication on a regular basis. She state she is trying to avoid knee surgery as long as she can

## 2017-09-20 NOTE — Telephone Encounter (Signed)
Also She is wanting to know if its ok to take Meloxicam 7.5 mg once a day for inflammation of her knees.

## 2017-09-20 NOTE — Telephone Encounter (Signed)
Pt c/o medication issue:  1. Name of [place]  2. How are you currently taking this medication (dosage and times per day)? 50 mg in the morning and 75 mg at night   3. Are you having a reaction (difficulty breathing--STAT)? No  4. What is your medication issue? A Recall

## 2017-09-20 NOTE — Telephone Encounter (Signed)
Left message on cell voice mail - okay to use meloxicam on infrequent basis. Any question may call back.

## 2017-09-20 NOTE — Telephone Encounter (Signed)
Ok to use meloxicam infrequently. Could substitute irbesartan for losartan. Will include pharmacy on this.  Dr. Lemmie Evens

## 2017-10-04 DIAGNOSIS — S43004D Unspecified dislocation of right shoulder joint, subsequent encounter: Secondary | ICD-10-CM | POA: Diagnosis not present

## 2017-10-04 DIAGNOSIS — M25511 Pain in right shoulder: Secondary | ICD-10-CM | POA: Diagnosis not present

## 2017-10-10 DIAGNOSIS — M25311 Other instability, right shoulder: Secondary | ICD-10-CM | POA: Diagnosis not present

## 2017-10-12 ENCOUNTER — Ambulatory Visit (INDEPENDENT_AMBULATORY_CARE_PROVIDER_SITE_OTHER): Payer: Medicare Other | Admitting: *Deleted

## 2017-10-12 DIAGNOSIS — J309 Allergic rhinitis, unspecified: Secondary | ICD-10-CM

## 2017-11-07 ENCOUNTER — Other Ambulatory Visit: Payer: Self-pay | Admitting: Family Medicine

## 2017-11-07 DIAGNOSIS — M25311 Other instability, right shoulder: Secondary | ICD-10-CM | POA: Diagnosis not present

## 2017-11-08 NOTE — Telephone Encounter (Signed)
Requesting refill      LOV:  08/15/17 LRF:  09/06/17

## 2017-11-15 DIAGNOSIS — H2513 Age-related nuclear cataract, bilateral: Secondary | ICD-10-CM | POA: Diagnosis not present

## 2017-11-26 ENCOUNTER — Ambulatory Visit (INDEPENDENT_AMBULATORY_CARE_PROVIDER_SITE_OTHER): Payer: Medicare Other | Admitting: *Deleted

## 2017-11-26 DIAGNOSIS — J309 Allergic rhinitis, unspecified: Secondary | ICD-10-CM | POA: Diagnosis not present

## 2017-11-27 ENCOUNTER — Other Ambulatory Visit: Payer: Self-pay | Admitting: Internal Medicine

## 2017-12-21 ENCOUNTER — Ambulatory Visit (INDEPENDENT_AMBULATORY_CARE_PROVIDER_SITE_OTHER): Payer: Medicare Other

## 2017-12-21 DIAGNOSIS — J309 Allergic rhinitis, unspecified: Secondary | ICD-10-CM

## 2017-12-28 ENCOUNTER — Ambulatory Visit (INDEPENDENT_AMBULATORY_CARE_PROVIDER_SITE_OTHER): Payer: Medicare Other

## 2017-12-28 DIAGNOSIS — J309 Allergic rhinitis, unspecified: Secondary | ICD-10-CM | POA: Diagnosis not present

## 2018-01-03 ENCOUNTER — Ambulatory Visit (INDEPENDENT_AMBULATORY_CARE_PROVIDER_SITE_OTHER): Payer: Medicare Other

## 2018-01-03 DIAGNOSIS — J309 Allergic rhinitis, unspecified: Secondary | ICD-10-CM | POA: Diagnosis not present

## 2018-01-11 ENCOUNTER — Ambulatory Visit (INDEPENDENT_AMBULATORY_CARE_PROVIDER_SITE_OTHER): Payer: Medicare Other

## 2018-01-11 DIAGNOSIS — J309 Allergic rhinitis, unspecified: Secondary | ICD-10-CM

## 2018-01-15 ENCOUNTER — Other Ambulatory Visit: Payer: Self-pay | Admitting: Physician Assistant

## 2018-01-17 ENCOUNTER — Ambulatory Visit (INDEPENDENT_AMBULATORY_CARE_PROVIDER_SITE_OTHER): Payer: Medicare Other

## 2018-01-17 DIAGNOSIS — H9313 Tinnitus, bilateral: Secondary | ICD-10-CM | POA: Diagnosis not present

## 2018-01-17 DIAGNOSIS — J309 Allergic rhinitis, unspecified: Secondary | ICD-10-CM | POA: Diagnosis not present

## 2018-01-17 DIAGNOSIS — H9312 Tinnitus, left ear: Secondary | ICD-10-CM | POA: Insufficient documentation

## 2018-01-17 DIAGNOSIS — H903 Sensorineural hearing loss, bilateral: Secondary | ICD-10-CM | POA: Diagnosis not present

## 2018-01-17 DIAGNOSIS — H9319 Tinnitus, unspecified ear: Secondary | ICD-10-CM | POA: Diagnosis not present

## 2018-01-25 DIAGNOSIS — M1712 Unilateral primary osteoarthritis, left knee: Secondary | ICD-10-CM | POA: Diagnosis not present

## 2018-01-25 DIAGNOSIS — M1711 Unilateral primary osteoarthritis, right knee: Secondary | ICD-10-CM | POA: Diagnosis not present

## 2018-01-25 DIAGNOSIS — M25561 Pain in right knee: Secondary | ICD-10-CM | POA: Diagnosis not present

## 2018-01-25 DIAGNOSIS — M25562 Pain in left knee: Secondary | ICD-10-CM | POA: Diagnosis not present

## 2018-02-11 ENCOUNTER — Other Ambulatory Visit: Payer: Self-pay | Admitting: Physician Assistant

## 2018-02-12 NOTE — Telephone Encounter (Addendum)
Last OV 08/15/2017 Last refill  09/06/2017 Ok to refill

## 2018-02-13 ENCOUNTER — Ambulatory Visit: Payer: Medicare Other | Admitting: Physician Assistant

## 2018-02-14 ENCOUNTER — Encounter: Payer: Self-pay | Admitting: Physician Assistant

## 2018-02-18 ENCOUNTER — Ambulatory Visit (INDEPENDENT_AMBULATORY_CARE_PROVIDER_SITE_OTHER): Payer: Medicare Other | Admitting: *Deleted

## 2018-02-18 DIAGNOSIS — J309 Allergic rhinitis, unspecified: Secondary | ICD-10-CM

## 2018-02-21 ENCOUNTER — Other Ambulatory Visit: Payer: Self-pay | Admitting: Physician Assistant

## 2018-02-21 DIAGNOSIS — M549 Dorsalgia, unspecified: Secondary | ICD-10-CM

## 2018-02-21 DIAGNOSIS — M542 Cervicalgia: Secondary | ICD-10-CM

## 2018-02-27 ENCOUNTER — Encounter: Payer: Self-pay | Admitting: Physician Assistant

## 2018-02-27 ENCOUNTER — Ambulatory Visit (INDEPENDENT_AMBULATORY_CARE_PROVIDER_SITE_OTHER): Payer: Medicare Other | Admitting: Physician Assistant

## 2018-02-27 ENCOUNTER — Other Ambulatory Visit: Payer: Self-pay

## 2018-02-27 VITALS — BP 130/86 | HR 72 | Temp 98.0°F | Resp 16 | Ht 66.0 in | Wt 220.6 lb

## 2018-02-27 DIAGNOSIS — I10 Essential (primary) hypertension: Secondary | ICD-10-CM

## 2018-02-27 DIAGNOSIS — E782 Mixed hyperlipidemia: Secondary | ICD-10-CM

## 2018-02-27 DIAGNOSIS — E119 Type 2 diabetes mellitus without complications: Secondary | ICD-10-CM

## 2018-02-27 DIAGNOSIS — I2510 Atherosclerotic heart disease of native coronary artery without angina pectoris: Secondary | ICD-10-CM
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
## 2018-02-28 LAB — COMPLETE METABOLIC PANEL WITH GFR
AG Ratio: 1.5 (calc) (ref 1.0–2.5)
ALT: 12 U/L (ref 6–29)
AST: 25 U/L (ref 10–35)
Albumin: 4.3 g/dL (ref 3.6–5.1)
Alkaline phosphatase (APISO): 43 U/L (ref 33–130)
BUN: 10 mg/dL (ref 7–25)
CO2: 27 mmol/L (ref 20–32)
Calcium: 9.8 mg/dL (ref 8.6–10.4)
Chloride: 92 mmol/L — ABNORMAL LOW (ref 98–110)
Creat: 0.69 mg/dL (ref 0.60–0.93)
GFR, Est African American: 101 mL/min/{1.73_m2} (ref >=60)
GFR, Est Non African American: 87 mL/min/{1.73_m2} (ref >=60)
Globulin: 2.8 g/dL (calc) (ref 1.9–3.7)
Glucose, Bld: 102 mg/dL — ABNORMAL HIGH (ref 65–99)
Potassium: 3.9 mmol/L (ref 3.5–5.3)
Sodium: 130 mmol/L — ABNORMAL LOW (ref 135–146)
Total Bilirubin: 0.9 mg/dL (ref 0.2–1.2)
Total Protein: 7.1 g/dL (ref 6.1–8.1)

## 2018-02-28 LAB — HEMOGLOBIN A1C
Hgb A1c MFr Bld: 6 % of total Hgb — ABNORMAL HIGH (ref <5.7)
Mean Plasma Glucose: 126 (calc)
eAG (mmol/L): 7 (calc)

## 2018-02-28 LAB — MICROALBUMIN, URINE: Microalb, Ur: 0.4 mg/dL

## 2018-02-28 LAB — LIPID PANEL
Cholesterol: 169 mg/dL (ref <200)
HDL: 66 mg/dL (ref >50)
LDL Cholesterol (Calc): 89 mg/dL (calc)
Non-HDL Cholesterol (Calc): 103 mg/dL (calc) (ref <130)
Total CHOL/HDL Ratio: 2.6 (calc) (ref <5.0)
Triglycerides: 47 mg/dL (ref <150)

## 2018-03-12 ENCOUNTER — Ambulatory Visit (INDEPENDENT_AMBULATORY_CARE_PROVIDER_SITE_OTHER): Payer: Medicare Other | Admitting: *Deleted

## 2018-03-12 DIAGNOSIS — J309 Allergic rhinitis, unspecified: Secondary | ICD-10-CM

## 2018-03-18 ENCOUNTER — Telehealth: Payer: Self-pay

## 2018-03-18 ENCOUNTER — Other Ambulatory Visit: Payer: Self-pay | Admitting: Physician Assistant

## 2018-03-18 DIAGNOSIS — M542 Cervicalgia: Secondary | ICD-10-CM

## 2018-03-18 DIAGNOSIS — M549 Dorsalgia, unspecified: Secondary | ICD-10-CM

## 2018-03-18 NOTE — Telephone Encounter (Signed)
Patient called requesting a referral to a podiatrist to have her toenail clipped. She normally goes to the nail salon to get them done but has a had a bad experience in the past. Patient also states she has a foot fungas on her right foot. Please advise if it is okay to place the referral

## 2018-03-19 NOTE — Telephone Encounter (Signed)
Podiatry referral not appropriate/ necessary.  Regarding possible foot fungus--- this can be managed at our office--she can schedule OV with me.  If she feels quite certain that it is fungus on the skin of her foot---she can even treat this with otc med and doesn't even need OV at all--- can treat with spray--for "athlete foot"--otc

## 2018-03-19 NOTE — Telephone Encounter (Signed)
Call placed to patient she states the foot fungus is better, but would still like to have her toenails clipped to keep it from growing into an ingrown toenail

## 2018-03-21 ENCOUNTER — Encounter: Payer: Self-pay | Admitting: Physician Assistant

## 2018-03-21 ENCOUNTER — Ambulatory Visit (INDEPENDENT_AMBULATORY_CARE_PROVIDER_SITE_OTHER): Payer: Medicare Other | Admitting: Physician Assistant

## 2018-03-21 VITALS — BP 122/68 | HR 66 | Temp 97.8°F | Resp 14 | Ht 66.0 in | Wt 217.6 lb

## 2018-03-21 DIAGNOSIS — L600 Ingrowing nail: Secondary | ICD-10-CM

## 2018-03-21 MED ORDER — CEPHALEXIN 500 MG PO CAPS: 500.0000 mg | ORAL_CAPSULE | Freq: Four times a day (QID) | ORAL | 0 refills | Status: AC

## 2018-03-21 MED ORDER — FLUCONAZOLE 150 MG PO TABS: 150.0000 mg | ORAL_TABLET | Freq: Once | ORAL | 0 refills | Status: AC

## 2018-03-21 NOTE — Progress Notes (Signed)
Patient ID: Madeline Wood MRN: [id number], DOB: [birth year], 72 y.o. Date of Encounter: 03/21/2018, 12:02 PM    Chief Complaint:  Chief Complaint  Patient presents with  . Ingrown Toenail     HPI: 72 y.o. year old female presents with above.   She reports that she has not clipped her own toenails or had any personal family or friend clip her toenails in the years.  States that she goes and gets pedicures routinely whenever her toenails need clipping. Reports that she used to always go to the same person and felt like they provided good clean care.  However that person recently was no longer available so she started going to different places for these pedicures.  Some of her recent experiences she was concerned that the equipment and supplies were not sterile/clean.  Also concerned that they clipped down into the edges of her toenails especially on her big first toes.  Over the last 1 to 2 weeks has been noticing some redness and soreness along the lateral aspect of her first toe on both feet.  Concerned that these toenails are ingrown. No other concerns to address today.     Home Meds:   Outpatient Medications Prior to Visit  Medication Sig Dispense Refill  . acetaminophen (TYLENOL) 325 MG tablet Take 2 tablets (650 mg total) by mouth every 6 (six) hours as needed. 30 tablet 0  . ALPRAZolam (XANAX) 1 MG tablet TAKE 1 TABLET TWICE A DAY 60 tablet 2  . aspirin 81 MG tablet Take 81 mg by mouth daily.      [name] atorvastatin (LIPITOR) 80 MG tablet TAKE 1 TABLET BY MOUTH EVERY DAY 90 tablet 4  . fexofenadine (ALLEGRA) 180 MG tablet Take 1 tablet (180 mg total) by mouth daily. 30 tablet 5  . fish oil-omega-3 fatty acids 1000 MG capsule Take 1 g by mouth. Takes occasionally    . fluticasone (FLONASE) 50 MCG/ACT nasal spray Place 1 spray into both nostrils 2 (two) times daily. 48 g 0  . levalbuterol (XOPENEX HFA) 45 MCG/ACT inhaler Inhale 2 puffs every 4 (four) hours as needed into the lungs  for wheezing. 1 Inhaler 12  . losartan (COZAAR) 50 MG tablet Take 1 tablet (50 mg total) by mouth daily. 90 tablet 4  . meloxicam (MOBIC) 7.5 MG tablet TAKE 1 TABLET BY MOUTH EVERY DAY 30 tablet 0  . metoprolol tartrate (LOPRESSOR) 50 MG tablet TAKE 1 TABLET BY MOUTH IN THE MORNING AND 1&1/2 IN THE EVENING 90 tablet 2  . Misc Natural Products (GLUCOSAMINE CHOND COMPLEX/MSM PO) Take by mouth. Occasionally    . Multiple Vitamin (MULTIVITAMIN) tablet Take 1 tablet by mouth. occasionally    . nitroGLYCERIN (NITROSTAT) 0.4 MG SL tablet Place 1 tablet (0.4 mg total) under the tongue every 5 (five) minutes as needed for chest pain. 25 tablet 3  . pantoprazole (PROTONIX) 40 MG tablet TAKE 1 TABLET EVERY DAY 90 tablet 3  . triamterene-hydrochlorothiazide (MAXZIDE-25) 37.5-25 MG tablet TAKE 1 TABLET BY MOUTH EVERY DAY 90 tablet 1  . zolpidem (AMBIEN) 10 MG tablet TAKE 1 TABLET BY MOUTH EVERY DAY AT BEDTIME AS NEEDED 30 tablet 5  . metoprolol tartrate (LOPRESSOR) 50 MG tablet TAKE 1 TABLET BY MOUTH IN THE MORNING AND 1&1/2 IN THE EVENING 90 tablet 5   No facility-administered medications prior to visit.     Allergies:  Allergies  Allergen Reactions  . Ciprofloxacin   . Citalopram Hydrobromide   . Escitalopram  Oxalate   . Paroxetine   . Polysporin [Bacitracin-Polymyxin B]       Review of Systems: See HPI for pertinent ROS. All other ROS negative.    Physical Exam: Blood pressure 122/68, pulse 66, temperature 97.8 F (36.6 C), temperature source Oral, resp. rate 14, height 5\' 6"  (1.676 m), weight 98.7 kg (217 lb 9.6 oz), SpO2 99 %., Body mass index is 35.12 kg/m. General:  AAF. Appears in no acute distress. Neck: Supple. No thyromegaly. No lymphadenopathy. Lungs: Clear bilaterally to auscultation without wheezes, rales, or rhonchi. Breathing is unlabored. Heart: Regular rhythm. No murmurs, rubs, or gallops. Msk:  Strength and tone normal for age. Extremities/Skin: Hammer toe of 2nd toe.    Lateral edge of toenail on 1st toe bilaterally with mild erythema and this area istender with palpation. There is no purulent drainage, no abscess.  No other areas of erythema or tenderness. Neuro: Alert and oriented X 3. Moves all extremities spontaneously. Gait is normal. CNII-XII grossly in tact. Psych:  Responds to questions appropriately with a normal affect.     ASSESSMENT AND PLAN:  72 y.o. year old female with  1. Ingrown toenail She is to take the Keflex as directed.  Also will refer to podiatry for follow-up and further management.  She is also requesting a Diflucan as she usually gets yeast infections with antibiotics so I did send in 1 Diflucan as well.  She is to monitor her toes/sites.  If worsens then follow-up with [place] otherwise we will follow-up with podiatry. - cephALEXin (KEFLEX) 500 MG capsule; Take 1 capsule (500 mg total) by mouth 4 (four) times daily for 10 days.  Dispense: 40 capsule; Refill: 0 - Ambulatory referral to Podiatry   SignedOlean Ree Wood, [place], [facility] 03/21/2018 12:02 PM

## 2018-04-01 ENCOUNTER — Ambulatory Visit (INDEPENDENT_AMBULATORY_CARE_PROVIDER_SITE_OTHER): Payer: Medicare Other | Admitting: *Deleted

## 2018-04-01 DIAGNOSIS — J309 Allergic rhinitis, unspecified: Secondary | ICD-10-CM

## 2018-04-02 ENCOUNTER — Ambulatory Visit (INDEPENDENT_AMBULATORY_CARE_PROVIDER_SITE_OTHER): Payer: Medicare Other | Admitting: Sports Medicine

## 2018-04-02 ENCOUNTER — Encounter: Payer: Self-pay | Admitting: Sports Medicine

## 2018-04-02 VITALS — BP 142/79 | HR 73 | Resp 16

## 2018-04-02 DIAGNOSIS — B351 Tinea unguium: Secondary | ICD-10-CM | POA: Diagnosis not present

## 2018-04-02 DIAGNOSIS — M79675 Pain in left toe(s): Secondary | ICD-10-CM

## 2018-04-02 DIAGNOSIS — E119 Type 2 diabetes mellitus without complications: Secondary | ICD-10-CM

## 2018-04-02 DIAGNOSIS — I739 Peripheral vascular disease, unspecified: Secondary | ICD-10-CM

## 2018-04-02 DIAGNOSIS — Z9229 Personal history of other drug therapy: Secondary | ICD-10-CM | POA: Diagnosis not present

## 2018-04-02 DIAGNOSIS — M79674 Pain in right toe(s): Secondary | ICD-10-CM

## 2018-04-02 NOTE — Progress Notes (Signed)
Subjective: Madeline Wood is a 72 y.o. female patient seen today in office with complaint of mildly painful thickened and elongated toenails; unable to trim due to R shoulder dislocation. Patient denies history of Diabetes but does admits PREDIABETES, Denies Neuropathy, or Vascular disease but does has a stent history and was on Plavix now only on Baby Aspirin. Patient has no other pedal complaints at this time.   Patient is using clotrimazole cream as recommended by PCP and has got fungi-nail as well that seems like its helping.  Review of Systems  All other systems reviewed and are negative.    Patient Active Problem List   Diagnosis Date Noted  . Bilateral high frequency sensorineural hearing loss 01/17/2018  . Subjective tinnitus of left ear 01/17/2018  . Atherosclerosis of native coronary artery of native heart without angina pectoris 07/23/2017  . History of food allergy 01/05/2016  . Angioedema 11/17/2015  . Allergic rhinoconjunctivitis, seasonal and perennial 05/04/2015  . Insomnia 08/24/2014  . Bilateral leg edema 04/17/2014  . Diabetes mellitus without complication ([place])   . Essential hypertension   . Gout   . Mixed hyperlipidemia   . 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 Anxiety   . Obesity   . Knee pain   . Positive TB test   . Hiatal hernia   . GERD (gastroesophageal reflux disease)   . Coronary atherosclerosis 06/06/2010  . GERD 06/06/2010  . History of colonic polyps 06/06/2010    Current Outpatient Medications on File Prior to Visit  Medication Sig Dispense Refill  . acetaminophen (TYLENOL) 325 MG tablet Take 2 tablets (650 mg total) by mouth every 6 (six) hours as needed. 30 tablet 0  . ALPRAZolam (XANAX) 1 MG tablet TAKE 1 TABLET TWICE A DAY 60 tablet 2  . aspirin 81 MG tablet Take 81 mg by mouth daily.      [name] atorvastatin (LIPITOR) 80 MG tablet TAKE 1 TABLET BY MOUTH EVERY DAY 90 tablet 4  . fexofenadine (ALLEGRA) 180 MG tablet Take 1 tablet (180 mg total) by mouth  daily. 30 tablet 5  . fish oil-omega-3 fatty acids 1000 MG capsule Take 1 g by mouth. Takes occasionally    . fluticasone (FLONASE) 50 MCG/ACT nasal spray Place 1 spray into both nostrils 2 (two) times daily. 48 g 0  . levalbuterol (XOPENEX HFA) 45 MCG/ACT inhaler Inhale 2 puffs every 4 (four) hours as needed into the lungs for wheezing. 1 Inhaler 12  . losartan (COZAAR) 50 MG tablet Take 1 tablet (50 mg total) by mouth daily. 90 tablet 4  . meloxicam (MOBIC) 7.5 MG tablet TAKE 1 TABLET BY MOUTH EVERY DAY 30 tablet 0  . metoprolol tartrate (LOPRESSOR) 50 MG tablet TAKE 1 TABLET BY MOUTH IN THE MORNING AND 1&1/2 IN THE EVENING 90 tablet 2  . Misc Natural Products (GLUCOSAMINE CHOND COMPLEX/MSM PO) Take by mouth. Occasionally    . Multiple Vitamin (MULTIVITAMIN) tablet Take 1 tablet by mouth. occasionally    . nitroGLYCERIN (NITROSTAT) 0.4 MG SL tablet Place 1 tablet (0.4 mg total) under the tongue every 5 (five) minutes as needed for chest pain. 25 tablet 3  . pantoprazole (PROTONIX) 40 MG tablet TAKE 1 TABLET EVERY DAY 90 tablet 3  . triamterene-hydrochlorothiazide (MAXZIDE-25) 37.5-25 MG tablet TAKE 1 TABLET BY MOUTH EVERY DAY 90 tablet 1  . zolpidem (AMBIEN) 10 MG tablet TAKE 1 TABLET BY MOUTH EVERY DAY AT BEDTIME AS NEEDED 30 tablet 5  . [DISCONTINUED] omeprazole (PRILOSEC OTC)  20 MG tablet Take 1 tablet (20 mg total) by mouth daily. 30 tablet 1   No current facility-administered medications on file prior to visit.     Allergies  Allergen Reactions  . Ciprofloxacin   . Citalopram Hydrobromide   . Escitalopram Oxalate   . Paroxetine   . Polysporin [Bacitracin-Polymyxin B]     Objective: Physical Exam  General: Well developed, nourished, no acute distress, awake, alert and oriented x 3  Vascular: Dorsalis pedis artery 1/4 bilateral, Posterior tibial artery 1/4 bilateral, skin temperature warm to warm proximal to distal bilateral lower extremities, no varicosities, pedal hair present  bilateral.  Neurological: Gross sensation present via light touch bilateral.   Dermatological: Skin is warm, dry, and supple bilateral, Nails 1-10 are tender, long, thick, and discolored with mild subungal debris, no webspace macerations present bilateral, no open lesions present bilateral, no callus/corns/hyperkeratotic tissue present bilateral. No signs of infection bilateral.  Musculoskeletal: Asymptomatic pes planus and bunion boney deformities noted bilateral. Muscular strength within normal limits without painon range of motion. No pain with calf compression bilateral.  Assessment and Plan:  Problem List Items Addressed This Visit      Endocrine   Diabetes mellitus without complication ([place])    Other Visit Diagnoses    Pain due to onychomycosis of toenails of both feet    -  Primary   PVD (peripheral vascular disease) ([place])       HX: long term anticoagulant use       Was on Plavix for Stents      -Examined patient.  -Discussed treatment options for painful mycotic nails. -Mechanically debrided and reduced mycotic nails with sterile nail nipper and dremel nail file without incident. -Continue with clotrimazole cream and fungi-nail  -Patient to return in 3 months for follow up evaluation or sooner if symptoms worsen.  [name], [name]

## 2018-04-06 ENCOUNTER — Other Ambulatory Visit: Payer: Self-pay | Admitting: Internal Medicine

## 2018-04-10 ENCOUNTER — Encounter: Payer: Self-pay | Admitting: *Deleted

## 2018-04-10 NOTE — Progress Notes (Signed)
Vial made. Exp: 04-11-19. hv

## 2018-04-12 DIAGNOSIS — J301 Allergic rhinitis due to pollen: Secondary | ICD-10-CM | POA: Diagnosis not present

## 2018-04-15 DIAGNOSIS — J3089 Other allergic rhinitis: Secondary | ICD-10-CM | POA: Diagnosis not present

## 2018-04-19 ENCOUNTER — Ambulatory Visit (INDEPENDENT_AMBULATORY_CARE_PROVIDER_SITE_OTHER): Payer: Medicare Other

## 2018-04-19 DIAGNOSIS — J309 Allergic rhinitis, unspecified: Secondary | ICD-10-CM | POA: Diagnosis not present

## 2018-04-23 ENCOUNTER — Other Ambulatory Visit: Payer: Self-pay | Admitting: Physician Assistant

## 2018-04-23 DIAGNOSIS — M549 Dorsalgia, unspecified: Secondary | ICD-10-CM

## 2018-04-23 DIAGNOSIS — M542 Cervicalgia: Secondary | ICD-10-CM

## 2018-04-24 ENCOUNTER — Other Ambulatory Visit: Payer: Self-pay | Admitting: Physician Assistant

## 2018-05-02 DIAGNOSIS — Z01419 Encounter for gynecological examination (general) (routine) without abnormal findings: Secondary | ICD-10-CM | POA: Diagnosis not present

## 2018-05-15 ENCOUNTER — Ambulatory Visit (INDEPENDENT_AMBULATORY_CARE_PROVIDER_SITE_OTHER): Payer: Medicare Other | Admitting: *Deleted

## 2018-05-15 DIAGNOSIS — J309 Allergic rhinitis, unspecified: Secondary | ICD-10-CM | POA: Diagnosis not present

## 2018-05-22 ENCOUNTER — Other Ambulatory Visit: Payer: Self-pay | Admitting: Physician Assistant

## 2018-05-22 DIAGNOSIS — M549 Dorsalgia, unspecified: Secondary | ICD-10-CM

## 2018-05-22 DIAGNOSIS — M542 Cervicalgia: Secondary | ICD-10-CM

## 2018-05-27 DIAGNOSIS — H9313 Tinnitus, bilateral: Secondary | ICD-10-CM | POA: Diagnosis not present

## 2018-05-27 DIAGNOSIS — H903 Sensorineural hearing loss, bilateral: Secondary | ICD-10-CM | POA: Diagnosis not present

## 2018-05-29 ENCOUNTER — Encounter: Payer: Self-pay | Admitting: Physician Assistant

## 2018-05-29 ENCOUNTER — Ambulatory Visit (INDEPENDENT_AMBULATORY_CARE_PROVIDER_SITE_OTHER): Payer: Medicare Other | Admitting: Physician Assistant

## 2018-05-29 VITALS — BP 142/82 | HR 76 | Temp 98.2°F | Resp 14 | Ht 66.0 in | Wt 216.8 lb

## 2018-05-29 DIAGNOSIS — H903 Sensorineural hearing loss, bilateral: Secondary | ICD-10-CM

## 2018-05-29 DIAGNOSIS — Z23 Encounter for immunization: Secondary | ICD-10-CM | POA: Diagnosis not present

## 2018-05-29 DIAGNOSIS — H9312 Tinnitus, left ear: Secondary | ICD-10-CM

## 2018-05-29 NOTE — Progress Notes (Signed)
Patient ID: Madeline Wood MRN: [id number], DOB: 08-06-1946, 72 y.o. Date of Encounter: 05/29/2018, 3:07 PM    Chief Complaint:  Chief Complaint  Patient presents with  . ear discomfort     HPI: 72 y.o. year old female presents with above.   She reports that recently she has been hearing a humming sound in her ear at times. Reports that she has seen Dr. Redmond Baseman at [facility] regarding this. She states that he told her that he could not give her a good explanation for her symptoms and that there was no cure for this. She reports that he did give tell her a couple of things that she could try doing to see if it would help the symptoms. She reports that she came here just to get my input to see whether she needs to see any other specialist to evaluate this.  Not sure if this is the sign of something more serious did not know if it needed to be further evaluated.     Home Meds:   Outpatient Medications Prior to Visit  Medication Sig Dispense Refill  . acetaminophen (TYLENOL) 325 MG tablet Take 2 tablets (650 mg total) by mouth every 6 (six) hours as needed. 30 tablet 0  . ALPRAZolam (XANAX) 1 MG tablet TAKE 1 TABLET TWICE A DAY 60 tablet 2  . aspirin 81 MG tablet Take 81 mg by mouth daily.      [name] atorvastatin (LIPITOR) 80 MG tablet TAKE 1 TABLET BY MOUTH EVERY DAY 90 tablet 4  . fexofenadine (ALLEGRA) 180 MG tablet Take 1 tablet (180 mg total) by mouth daily. 30 tablet 5  . fish oil-omega-3 fatty acids 1000 MG capsule Take 1 g by mouth. Takes occasionally    . fluticasone (FLONASE) 50 MCG/ACT nasal spray Place 1 spray into both nostrils 2 (two) times daily. 48 g 0  . levalbuterol (XOPENEX HFA) 45 MCG/ACT inhaler Inhale 2 puffs every 4 (four) hours as needed into the lungs for wheezing. 1 Inhaler 12  . losartan (COZAAR) 50 MG tablet Take 1 tablet (50 mg total) by mouth daily. 90 tablet 4  . meloxicam (MOBIC) 7.5 MG tablet TAKE 1 TABLET BY MOUTH EVERY DAY 30 tablet 0  . metoprolol tartrate  (LOPRESSOR) 50 MG tablet TAKE 1 TABLET EVERY MORNING AND 1 AND 1/2 TABLET EVERY EVENING 225 tablet 1  . Misc Natural Products (GLUCOSAMINE CHOND COMPLEX/MSM PO) Take by mouth. Occasionally    . Multiple Vitamin (MULTIVITAMIN) tablet Take 1 tablet by mouth. occasionally    . nitroGLYCERIN (NITROSTAT) 0.4 MG SL tablet Place 1 tablet (0.4 mg total) under the tongue every 5 (five) minutes as needed for chest pain. 25 tablet 3  . pantoprazole (PROTONIX) 40 MG tablet TAKE 1 TABLET EVERY DAY 90 tablet 0  . triamterene-hydrochlorothiazide (MAXZIDE-25) 37.5-25 MG tablet TAKE 1 TABLET BY MOUTH EVERY DAY 90 tablet 1  . zolpidem (AMBIEN) 10 MG tablet TAKE 1 TABLET BY MOUTH EVERY DAY AT BEDTIME AS NEEDED 30 tablet 5   No facility-administered medications prior to visit.     Allergies:  Allergies  Allergen Reactions  . Ciprofloxacin   . Citalopram Hydrobromide   . Escitalopram Oxalate   . Paroxetine   . Polysporin [Bacitracin-Polymyxin B]       Review of Systems: See HPI for pertinent ROS. All other ROS negative.    Physical Exam: Blood pressure (!) 142/82, pulse 76, temperature 98.2 F (36.8 C), temperature source Oral, resp. rate 14,  height 5\' 6"  (1.676 m), weight 98.3 kg, SpO2 98 %., Body mass index is 34.99 kg/m. General:  AAF. Appears in no acute distress. HEENT: Normocephalic, atraumatic, eyes without discharge, sclera non-icteric, nares are without discharge. Bilateral auditory canals clear, TM's are without perforation, pearly grey and translucent with reflective cone of light bilaterally.  Neck: Supple. No thyromegaly. No lymphadenopathy. Lungs: Clear bilaterally to auscultation without wheezes, rales, or rhonchi. Breathing is unlabored. Heart: Regular rhythm. No murmurs, rubs, or gallops. Msk:  Strength and tone normal for age. Extremities/Skin: Warm and dry.  Neuro: Alert and oriented X 3. Moves all extremities spontaneously. Gait is normal. CNII-XII grossly in tact. Psych:   Responds to questions appropriately with a normal affect.     ASSESSMENT AND PLAN:  72 y.o. year old female with   1. Subjective tinnitus of left ear I reviewed Dr. Redmond Baseman note. Note documents that she had some high-frequency hearing loss.  Demented that she had no other abnormal findings.  Noted that physical exam was normal.  Noted that she had tinnitus and that he discussed that often the cause of this is unknown and there is no cure. Reassured her that this is a very common symptom for many people as they get older. Reassured her that medical literature does state that some cases are caused by hearing loss but otherwise generally no definitive cause for tinnitus can be identified and there is no specific treatment. Reassured her that no further evaluation is warranted. She voices understanding and agrees. Does want to get her influenza vaccine while she is here.  2. Bilateral high frequency sensorineural hearing loss    Signed, [address] [place], [place], [facility] 05/29/2018 3:07 PM

## 2018-06-19 ENCOUNTER — Ambulatory Visit (INDEPENDENT_AMBULATORY_CARE_PROVIDER_SITE_OTHER): Payer: Medicare Other

## 2018-06-19 ENCOUNTER — Other Ambulatory Visit: Payer: Self-pay | Admitting: Physician Assistant

## 2018-06-19 DIAGNOSIS — M542 Cervicalgia: Secondary | ICD-10-CM

## 2018-06-19 DIAGNOSIS — J309 Allergic rhinitis, unspecified: Secondary | ICD-10-CM

## 2018-06-19 DIAGNOSIS — M549 Dorsalgia, unspecified: Secondary | ICD-10-CM

## 2018-06-27 ENCOUNTER — Other Ambulatory Visit: Payer: Self-pay | Admitting: Physician Assistant

## 2018-06-28 NOTE — Telephone Encounter (Signed)
Ok to refill??  Last office visit 05/29/2018.  Last refill 02/13/2018, #2 refills.

## 2018-07-01 ENCOUNTER — Ambulatory Visit (INDEPENDENT_AMBULATORY_CARE_PROVIDER_SITE_OTHER): Payer: Medicare Other

## 2018-07-01 DIAGNOSIS — J309 Allergic rhinitis, unspecified: Secondary | ICD-10-CM | POA: Diagnosis not present

## 2018-07-01 DIAGNOSIS — M1712 Unilateral primary osteoarthritis, left knee: Secondary | ICD-10-CM | POA: Diagnosis not present

## 2018-07-01 DIAGNOSIS — M1711 Unilateral primary osteoarthritis, right knee: Secondary | ICD-10-CM | POA: Diagnosis not present

## 2018-07-02 ENCOUNTER — Ambulatory Visit (INDEPENDENT_AMBULATORY_CARE_PROVIDER_SITE_OTHER): Payer: Medicare Other | Admitting: Sports Medicine

## 2018-07-02 ENCOUNTER — Encounter: Payer: Self-pay | Admitting: Sports Medicine

## 2018-07-02 DIAGNOSIS — I739 Peripheral vascular disease, unspecified: Secondary | ICD-10-CM

## 2018-07-02 DIAGNOSIS — E119 Type 2 diabetes mellitus without complications: Secondary | ICD-10-CM

## 2018-07-02 DIAGNOSIS — M79675 Pain in left toe(s): Secondary | ICD-10-CM | POA: Diagnosis not present

## 2018-07-02 DIAGNOSIS — Z9229 Personal history of other drug therapy: Secondary | ICD-10-CM

## 2018-07-02 DIAGNOSIS — M79674 Pain in right toe(s): Secondary | ICD-10-CM

## 2018-07-02 DIAGNOSIS — B351 Tinea unguium: Secondary | ICD-10-CM

## 2018-07-02 NOTE — Progress Notes (Signed)
Subjective: JOSSLYN CIOLEK is a 72 y.o. female patient seen today in office with complaint of mildly painful thickened and elongated toenails; unable to trim due to R shoulder dislocation. Patient still on ASA treatment. Patient has no other pedal complaints at this time.   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   Patient Active Problem List   Diagnosis Date Noted  . Bilateral high frequency sensorineural hearing loss 01/17/2018  . Subjective tinnitus of left ear 01/17/2018  . Atherosclerosis of native coronary artery of native heart without angina pectoris 07/23/2017  . History of food allergy 01/05/2016  . Angioedema 11/17/2015  . Allergic rhinoconjunctivitis, seasonal and perennial 05/04/2015  . Insomnia 08/24/2014  . Bilateral leg edema 04/17/2014  . Diabetes mellitus without complication ([place])   . Essential hypertension   . Gout   . Mixed hyperlipidemia   . 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 Anxiety   . Obesity   . Knee pain   . Positive TB test   . Hiatal hernia   . GERD (gastroesophageal reflux disease)   . Coronary atherosclerosis 06/06/2010  . GERD 06/06/2010  . History of colonic polyps 06/06/2010    Current Outpatient Medications on File Prior to Visit  Medication Sig Dispense Refill  . acetaminophen (TYLENOL) 325 MG tablet Take 2 tablets (650 mg total) by mouth every 6 (six) hours as needed. 30 tablet 0  . ALPRAZolam (XANAX) 1 MG tablet TAKE 1 TABLET BY MOUTH TWICE A DAY 60 tablet 2  . aspirin 81 MG tablet Take 81 mg by mouth daily.      [name] atorvastatin (LIPITOR) 80 MG tablet TAKE 1 TABLET BY MOUTH EVERY DAY 90 tablet 4  . fexofenadine (ALLEGRA) 180 MG tablet Take 1 tablet (180 mg total) by mouth daily. 30 tablet 5  . fish oil-omega-3 fatty acids 1000 MG capsule Take 1 g by mouth. Takes occasionally    . fluticasone (FLONASE) 50 MCG/ACT nasal spray Place 1 spray into both  nostrils 2 (two) times daily. 48 g 0  . levalbuterol (XOPENEX HFA) 45 MCG/ACT inhaler Inhale 2 puffs every 4 (four) hours as needed into the lungs for wheezing. 1 Inhaler 12  . losartan (COZAAR) 50 MG tablet Take 1 tablet (50 mg total) by mouth daily. 90 tablet 4  . meloxicam (MOBIC) 7.5 MG tablet TAKE 1 TABLET BY MOUTH EVERY DAY 30 tablet 0  . metoprolol tartrate (LOPRESSOR) 50 MG tablet TAKE 1 TABLET EVERY MORNING AND 1 AND 1/2 TABLET EVERY EVENING 225 tablet 1  . Misc Natural Products (GLUCOSAMINE CHOND COMPLEX/MSM PO) Take by mouth. Occasionally    . Multiple Vitamin (MULTIVITAMIN) tablet Take 1 tablet by mouth. occasionally    . nitroGLYCERIN (NITROSTAT) 0.4 MG SL tablet Place 1 tablet (0.4 mg total) under the tongue every 5 (five) minutes as needed for chest pain. 25 tablet 3  . pantoprazole (PROTONIX) 40 MG tablet TAKE 1 TABLET EVERY DAY 90 tablet 0  . triamterene-hydrochlorothiazide (MAXZIDE-25) 37.5-25 MG tablet TAKE 1 TABLET BY MOUTH EVERY DAY 90 tablet 1  . zolpidem (AMBIEN) 10 MG tablet TAKE 1 TABLET BY MOUTH EVERY DAY AT BEDTIME AS NEEDED 30 tablet 5  . [DISCONTINUED] omeprazole (PRILOSEC OTC) 20 MG tablet Take 1 tablet (20 mg total) by mouth daily. 30 tablet 1   No current facility-administered medications on file prior to visit.     Allergies  Allergen  Reactions  . Ciprofloxacin   . Citalopram Hydrobromide   . Escitalopram Oxalate   . Paroxetine   . Polysporin [Bacitracin-Polymyxin B]     Objective: Physical Exam  General: Well developed, nourished, no acute distress, awake, alert and oriented x 3  Vascular: Dorsalis pedis artery 1/4 bilateral, Posterior tibial artery 1/4 bilateral, skin temperature warm to warm proximal to distal bilateral lower extremities, no varicosities, pedal hair present bilateral.  Neurological: Gross sensation present via light touch bilateral.   Dermatological: Skin is warm, dry, and supple bilateral, Nails 1-10 are tender, long, thick, and  discolored with mild subungal debris, no webspace macerations present bilateral, no open lesions present bilateral, no callus/corns/hyperkeratotic tissue present bilateral. No signs of infection bilateral.  Musculoskeletal: Asymptomatic pes planus and bunion boney deformities noted bilateral. Muscular strength within normal limits without painon range of motion. No pain with calf compression bilateral.  Assessment and Plan:  Problem List Items Addressed This Visit      Endocrine   Diabetes mellitus without complication ([place])    Other Visit Diagnoses    Pain due to onychomycosis of toenails of both feet    -  Primary   PVD (peripheral vascular disease) ([place])       HX: long term anticoagulant use         -Examined patient.  -Discussed treatment options for painful mycotic nails. -Mechanically debrided and reduced mycotic nails with sterile nail nipper and dremel nail file without incident. -Continue with clotrimazole cream and fungi-nail as previous -Patient to return in 3 months for follow up evaluation or sooner if symptoms worsen.  [name], [name]

## 2018-07-03 ENCOUNTER — Encounter: Payer: Self-pay | Admitting: Internal Medicine

## 2018-07-03 ENCOUNTER — Ambulatory Visit: Payer: Medicare Other | Admitting: Internal Medicine

## 2018-07-03 ENCOUNTER — Ambulatory Visit (INDEPENDENT_AMBULATORY_CARE_PROVIDER_SITE_OTHER): Payer: Medicare Other | Admitting: Internal Medicine

## 2018-07-03 VITALS — BP 144/88 | HR 60 | Ht 66.0 in | Wt 215.0 lb

## 2018-07-03 DIAGNOSIS — I2510 Atherosclerotic heart disease of native coronary artery without angina pectoris: Secondary | ICD-10-CM | POA: Diagnosis not present

## 2018-07-03 DIAGNOSIS — I10 Essential (primary) hypertension: Secondary | ICD-10-CM | POA: Diagnosis not present

## 2018-07-03 DIAGNOSIS — E782 Mixed hyperlipidemia: Secondary | ICD-10-CM | POA: Diagnosis not present

## 2018-07-03 NOTE — Progress Notes (Signed)
OFFICE NOTE  Chief Complaint:  Routine follow-up  Primary Care Physician: Orlena Sheldon, PA-C  HPI:  Madeline Wood is a 72 year old female patient formerly followed by Dr. Rollene Fare, who works as a Photographer. She has a history of coronary disease and had a drug-eluting stent placed to the distal RCA in 2006. She had a negative Myoview in 2012. Just has a history of obesity, hypertension and dyslipidemia. In 2012 underwent back surgery by Dr. Lynann Bologna and has had a good recovery from that.  Recently she had some problems with low blood pressure and had a decrease in her losartan dose to 50 mg once daily which has helped. She has however been having some lower extremity swelling.  When she last saw Dr. Rollene Fare in April 2014, he recommended starting her Dyazide to one full tablet daily. She did not start that given her concern about the change in her blood pressure. It is noted however her blood pressure is slightly higher today 150/84. She is asymptomatic, denying any chest pain, shortness of breath, palpitations, presyncope or syncopal symptoms.  Madeline Wood returns today for followup.  She denies any chest pain or worsening shortness of breath. Recent laboratory work shows good control of her cholesterol.  She is currently on atorvastatin 80 mg daily and recently had that he had discontinued due to excellent cholesterol control. She is also on aspirin and Plavix as well as losartan and Lopressor.  06/01/2016  Madeline Wood returns today for follow-up. She's done very well over he past year. r worsening shortness of breath. Weight is stable and I've encouraged her to continue to work on lowering it. Cholesterol control is been excellent. She has been on long-standing aspirin and Plavix however her stent was in 2006. Is not clear that there is an ongoing indication for dual antiplatelet therapy.Blood pressure is well controlled. EKG shows normal sinus rhythm at 62.  07/23/2017  [name] returns today for follow-up.  She denies any new symptoms such as chest pain or worsening shortness of breath.  She had repeat lipid testing and May which showed an LDL C of 75, down slightly from her most recent testing.  Her hemoglobin A1c also is improved at 5.9.  This is mostly with dietary changes, what weight has been fairly stable.  She is not on any diabetic medications.  Reports a decrease in exercise recently and is committed to restarting that.  07/03/2018  Madeline Wood is seen today for routine follow-up.  Overall she continues to be without complaints.  Blood pressure initially was elevated 156/90 have her recheck came down to 144/88.  She was late for the appointment today and rush to get to the office.  She denies any new chest pain or worsening shortness of breath.  The lipid panel in July 2019 which showed total cholesterol 169, HDL 66, triglycerides 47 and LDL 89.  This represents good control however is not a goal LDL less than 70.  PMHx:  Past Medical History:  Diagnosis Date  . 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 Anxiety   . CAD (coronary artery disease)    stent of distal RCA in 2006  . Diabetes mellitus without complication ([place])   . Diverticulosis   . GERD (gastroesophageal reflux disease)   . Gout   . Hiatal hernia   . History of nuclear stress test 03/2011   negative lexiscan myoview; normal perfusion  . Hyperlipidemia   . Hypertension   . Knee pain   .  Obesity   . Positive TB test   . Venous insufficiency     Past Surgical History:  Procedure Laterality Date  . ABDOMINAL HYSTERECTOMY    . BACK SURGERY  2012  . BREAST LUMPECTOMY    . CORONARY ANGIOPLASTY WITH STENT PLACEMENT  06/29/2005   Taxus 2.5x87mm DES to distal RCA (Dr. Tami Ribas)  . TRANSTHORACIC ECHOCARDIOGRAM  12/20/2012   EF 29-79%, normal systolic function, mild hypokinesis of inf myocardium; calcified MV annulua, LA & RA mildly dilated    FAMHx:  Family History  Problem Relation Age of Onset  .  Diabetes Sister   . Pancreatic cancer Brother   . Ovarian cancer Mother   . Other Father        homicide  . Heart attack Brother        x2  . Hypertension Brother   . Hypertension Sister     SOCHx:   reports that she quit smoking about 16 years ago. She has never used smokeless tobacco. She reports that she does not drink alcohol or use drugs.  ALLERGIES:  Allergies  Allergen Reactions  . Ciprofloxacin   . Citalopram Hydrobromide   . Escitalopram Oxalate   . Paroxetine   . Polysporin [Bacitracin-Polymyxin B]     ROS: Pertinent items noted in HPI and remainder of comprehensive ROS otherwise negative.  HOME MEDS: Current Outpatient Medications  Medication Sig Dispense Refill  . acetaminophen (TYLENOL) 325 MG tablet Take 2 tablets (650 mg total) by mouth every 6 (six) hours as needed. 30 tablet 0  . ALPRAZolam (XANAX) 1 MG tablet TAKE 1 TABLET BY MOUTH TWICE A DAY 60 tablet 2  . aspirin 81 MG tablet Take 81 mg by mouth daily.      [name] atorvastatin (LIPITOR) 80 MG tablet TAKE 1 TABLET BY MOUTH EVERY DAY 90 tablet 4  . fexofenadine (ALLEGRA) 180 MG tablet Take 1 tablet (180 mg total) by mouth daily. 30 tablet 5  . fish oil-omega-3 fatty acids 1000 MG capsule Take 1 g by mouth. Takes occasionally    . fluticasone (FLONASE) 50 MCG/ACT nasal spray Place 1 spray into both nostrils 2 (two) times daily. 48 g 0  . levalbuterol (XOPENEX HFA) 45 MCG/ACT inhaler Inhale 2 puffs every 4 (four) hours as needed into the lungs for wheezing. 1 Inhaler 12  . losartan (COZAAR) 50 MG tablet Take 1 tablet (50 mg total) by mouth daily. 90 tablet 4  . meloxicam (MOBIC) 7.5 MG tablet TAKE 1 TABLET BY MOUTH EVERY DAY 30 tablet 0  . metoprolol tartrate (LOPRESSOR) 50 MG tablet TAKE 1 TABLET EVERY MORNING AND 1 AND 1/2 TABLET EVERY EVENING 225 tablet 1  . Misc Natural Products (GLUCOSAMINE CHOND COMPLEX/MSM PO) Take by mouth. Occasionally    . Multiple Vitamin (MULTIVITAMIN) tablet Take 1 tablet by mouth.  occasionally    . nitroGLYCERIN (NITROSTAT) 0.4 MG SL tablet Place 1 tablet (0.4 mg total) under the tongue every 5 (five) minutes as needed for chest pain. 25 tablet 3  . pantoprazole (PROTONIX) 40 MG tablet TAKE 1 TABLET EVERY DAY 90 tablet 0  . traMADol (ULTRAM) 50 MG tablet     . triamterene-hydrochlorothiazide (MAXZIDE-25) 37.5-25 MG tablet TAKE 1 TABLET BY MOUTH EVERY DAY 90 tablet 1  . zolpidem (AMBIEN) 10 MG tablet TAKE 1 TABLET BY MOUTH EVERY DAY AT BEDTIME AS NEEDED 30 tablet 5   No current facility-administered medications for this visit.     LABS/IMAGING: No results found for this  or any previous visit (from the past 48 hour(s)). No results found.  VITALS: There were no vitals taken for this visit.  EXAM: General appearance: alert and no distress Neck: no adenopathy, no carotid bruit, no JVD, supple, symmetrical, trachea midline and thyroid not enlarged, symmetric, no tenderness/mass/nodules Lungs: clear to auscultation bilaterally Heart: regular rate and rhythm, S1, S2 normal, no murmur, click, rub or gallop Abdomen: soft, non-tender; bowel sounds normal; no masses,  no organomegaly and obese Extremities: edema 1+ bilaterally and no venous stasis changes or varicose veins Pulses: 2+ and symmetric Skin: Skin color, texture, turgor normal. No rashes or lesions Neurologic: Grossly normal Psych: Pleasant mood, affect  EKG: Sinus rhythm with sinus arrhythmia at 60-personally reviewed  ASSESSMENT: 1. Coronary disease status post PCI to the distal RCA in 2006 (DES) 2. Obesity 3. Hypertension 4. Dyslipidemia  PLAN: 1.   Madeline Wood had no further cardiac events.  Blood pressure is minimally elevated today.  Her cholesterol remains above goal LDL less than 70.  She will need to work on aggressive diet.  She does take atorvastatin 80 mg daily.  We could consider the addition of ezetimibe if she does not reach her target LDL less than 70.  Follow-up annually or sooner as  necessary.  Pixie Casino, MD, [place], [place] Director of the [facility] Attending Cardiologist  Direct Dial: [phone]  Fax: [phone]  Website:  [email or web address]  [place] 07/03/2018, 12:16 PM

## 2018-07-03 NOTE — Patient Instructions (Signed)

## 2018-07-04 ENCOUNTER — Telehealth: Payer: Self-pay | Admitting: Physician Assistant

## 2018-07-04 NOTE — Telephone Encounter (Signed)
Pt is going to the [place] on 15th and needs something for motion sickness, she would prefer a patch if possible, please call in to [facility].

## 2018-07-05 ENCOUNTER — Encounter: Payer: Self-pay | Admitting: Internal Medicine

## 2018-07-09 ENCOUNTER — Other Ambulatory Visit: Payer: Self-pay | Admitting: Physician Assistant

## 2018-07-09 ENCOUNTER — Ambulatory Visit: Payer: Medicare Other | Admitting: Internal Medicine

## 2018-07-09 ENCOUNTER — Telehealth: Payer: Self-pay | Admitting: Internal Medicine

## 2018-07-09 DIAGNOSIS — I2510 Atherosclerotic heart disease of native coronary artery without angina pectoris: Secondary | ICD-10-CM

## 2018-07-09 MED ORDER — NITROGLYCERIN 0.4 MG SL SUBL: 0.4000 mg | SUBLINGUAL_TABLET | SUBLINGUAL | 3 refills | Status: DC | PRN

## 2018-07-09 NOTE — Telephone Encounter (Signed)
Patient would like to know if Dr. Debara Pickett can write her a prescription for the patch for motion sickness.   She would like the sent to : [id number] - [place], [place]

## 2018-07-09 NOTE — Telephone Encounter (Signed)
Returned call to patient, advised to defer patch to PCP.   Request new rx for NTG to take on cruise as hers has expired.    rx sent to pharmacy.  Patient verbalized understanding.

## 2018-07-09 NOTE — Telephone Encounter (Signed)
Given age, I am concerned about scopolamine patch for 72 hours.  She can use otc dramamine as needed.  It would be safer.

## 2018-07-10 NOTE — Telephone Encounter (Signed)
Patient aware of providers recommendations.  

## 2018-07-19 ENCOUNTER — Other Ambulatory Visit: Payer: Self-pay | Admitting: Internal Medicine

## 2018-07-23 DIAGNOSIS — M1712 Unilateral primary osteoarthritis, left knee: Secondary | ICD-10-CM | POA: Diagnosis not present

## 2018-07-23 DIAGNOSIS — M1711 Unilateral primary osteoarthritis, right knee: Secondary | ICD-10-CM | POA: Diagnosis not present

## 2018-07-30 ENCOUNTER — Other Ambulatory Visit: Payer: Self-pay | Admitting: Family Medicine

## 2018-07-30 DIAGNOSIS — M1711 Unilateral primary osteoarthritis, right knee: Secondary | ICD-10-CM | POA: Diagnosis not present

## 2018-07-30 DIAGNOSIS — M1712 Unilateral primary osteoarthritis, left knee: Secondary | ICD-10-CM | POA: Diagnosis not present

## 2018-07-30 MED ORDER — PANTOPRAZOLE SODIUM 40 MG PO TBEC: 40.0000 mg | DELAYED_RELEASE_TABLET | Freq: Every day | ORAL | 0 refills | Status: DC

## 2018-08-06 DIAGNOSIS — M1712 Unilateral primary osteoarthritis, left knee: Secondary | ICD-10-CM | POA: Diagnosis not present

## 2018-08-06 DIAGNOSIS — M1711 Unilateral primary osteoarthritis, right knee: Secondary | ICD-10-CM | POA: Diagnosis not present

## 2018-08-08 ENCOUNTER — Ambulatory Visit (INDEPENDENT_AMBULATORY_CARE_PROVIDER_SITE_OTHER): Payer: Medicare Other | Admitting: Family Medicine

## 2018-08-08 ENCOUNTER — Encounter: Payer: Self-pay | Admitting: Family Medicine

## 2018-08-08 VITALS — BP 130/74 | HR 62 | Temp 97.6°F | Resp 15 | Ht 66.0 in | Wt 212.5 lb

## 2018-08-08 DIAGNOSIS — J209 Acute bronchitis, unspecified: Secondary | ICD-10-CM

## 2018-08-08 DIAGNOSIS — J0140 Acute pansinusitis, unspecified: Secondary | ICD-10-CM

## 2018-08-08 DIAGNOSIS — J3089 Other allergic rhinitis: Secondary | ICD-10-CM | POA: Diagnosis not present

## 2018-08-08 MED ORDER — BENZONATATE 100 MG PO CAPS: 100.0000 mg | ORAL_CAPSULE | Freq: Three times a day (TID) | ORAL | 0 refills | Status: DC | PRN

## 2018-08-08 MED ORDER — GUAIFENESIN ER 600 MG PO TB12: 600.0000 mg | ORAL_TABLET | Freq: Two times a day (BID) | ORAL | 0 refills | Status: AC

## 2018-08-08 MED ORDER — HYDROCODONE-HOMATROPINE 5-1.5 MG/5ML PO SYRP: 5.0000 mL | ORAL_SOLUTION | Freq: Three times a day (TID) | ORAL | 0 refills | Status: DC | PRN

## 2018-08-08 MED ORDER — DOXYCYCLINE HYCLATE 100 MG PO TABS: 100.0000 mg | ORAL_TABLET | Freq: Two times a day (BID) | ORAL | 0 refills | Status: AC

## 2018-08-08 MED ORDER — PREDNISONE 20 MG PO TABS: 40.0000 mg | ORAL_TABLET | Freq: Every day | ORAL | 0 refills | Status: AC

## 2018-08-08 MED ORDER — MONTELUKAST SODIUM 10 MG PO TABS: 10.0000 mg | ORAL_TABLET | Freq: Every day | ORAL | 3 refills | Status: DC

## 2018-08-08 NOTE — Patient Instructions (Addendum)
Make sure for the next 1-2 weeks you are taking a daily antihistamine and steroid nasal spray.  I recommend adding saline nasal spray and singulair (temporarily since you are behind on your allergy injections).    Take the antibiotics and steroids.  Antibiotics will treat bacterial sinusitis and atypical lung infections.  Steroids will help with your sinus swelling and chest congestion cough and wheeze.  Use your Xopenex inhaler frequently for the next couple days and then as needed every 4-6 hours cough, wheeze and shortness of breath.  You can try Tessalon Perles for cough suppression and can use sparingly the cough syrup -it will suppress her respirations because of the controlled substance hydrocodone component, make sure you do not take at the same time as any other sedating medicines such as sleep aids or antianxiety medicines.    I would call your allergist early next week and see as soon as they are willing to give you your shots to prevent this from worsening again.

## 2018-08-08 NOTE — Progress Notes (Signed)
Patient ID: Madeline Wood, female    DOB: Jan 18, 1946, 72 y.o.   MRN: [id number]  PCP: Orlena Sheldon, PA-C  Chief Complaint  Patient presents with  . Cough    Patient has c/o chest congestion, cough, nasal congestion. Onset 2 weeks ago    Subjective:   Madeline Wood is a 72 y.o. female, presents to clinic with CC of 3-4 weeks of nasal drainage/discharge, sinus congestion/pain/pressure, scratchy sore throat, and productive cough with sputum recently turning yellow.  Cough is worse when supine, some intermittent associated tightness, wheeze and SOB with coughing. She has xopenex inhaler at home but has not used it for coughing.   She is compliant with allergy meds - allegra and flonase - but usually get allergy injections and since becoming ill she has not been able to get her injections for the past 3 weeks and she feels this is making her worse.  She has associated fatigue and decreased appetite.  She denies any night sweats, weight loss, rash, near-syncope, chest pain, nausea, vomiting, diarrhea.   Patient Active Problem List   Diagnosis Date Noted  . Bilateral high frequency sensorineural hearing loss 01/17/2018  . Subjective tinnitus of left ear 01/17/2018  . Atherosclerosis of native coronary artery of native heart without angina pectoris 07/23/2017  . History of food allergy 01/05/2016  . Angioedema 11/17/2015  . Allergic rhinoconjunctivitis, seasonal and perennial 05/04/2015  . Insomnia 08/24/2014  . Bilateral leg edema 04/17/2014  . Diabetes mellitus without complication ([place])   . Essential hypertension   . Gout   . Mixed hyperlipidemia   . 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 Anxiety   . Obesity   . Knee pain   . Positive TB test   . Hiatal hernia   . GERD (gastroesophageal reflux disease)   . Coronary atherosclerosis 06/06/2010  . GERD 06/06/2010  . History of colonic polyps 06/06/2010     Prior to Admission medications   Medication Sig Start Date End Date Taking?  Authorizing Provider  acetaminophen (TYLENOL) 325 MG tablet Take 2 tablets (650 mg total) by mouth every 6 (six) hours as needed. 07/27/17  Yes Caccavale, Sophia, PA-C  ALPRAZolam (XANAX) 1 MG tablet TAKE 1 TABLET BY MOUTH TWICE A DAY 06/28/18  Yes [name], MD  aspirin 81 MG tablet Take 81 mg by mouth daily.     Yes [provider]  atorvastatin (LIPITOR) 80 MG tablet TAKE 1 TABLET BY MOUTH EVERY DAY 08/02/17  Yes Hilty, Nadean Corwin, MD  fexofenadine (ALLEGRA) 180 MG tablet Take 1 tablet (180 mg total) by mouth daily. 07/01/14  Yes Dena Billet B, PA-C  fish oil-omega-3 fatty acids 1000 MG capsule Take 1 g by mouth. Takes occasionally   Yes [provider]  fluticasone (FLONASE) 50 MCG/ACT nasal spray Place 1 spray into both nostrils 2 (two) times daily. 11/20/16  Yes Bobbitt, Sedalia Muta, MD  levalbuterol [facility] HFA) 45 MCG/ACT inhaler Inhale 2 puffs every 4 (four) hours as needed into the lungs for wheezing. 07/13/17  Yes Dena Billet B, PA-C  losartan (COZAAR) 25 MG tablet TAKE 2 TABLETS (50MG ) BY MOUTH EVERY DAY 07/19/18  Yes Hilty, Nadean Corwin, MD  meloxicam (MOBIC) 7.5 MG tablet TAKE 1 TABLET BY MOUTH EVERY DAY 06/19/18  Yes [name], MD  metoprolol tartrate (LOPRESSOR) 50 MG tablet TAKE 1 TABLET EVERY MORNING AND 1 AND 1/2 TABLET EVERY EVENING 04/09/18  Yes Hilty, Nadean Corwin, MD  Misc Natural  Products (GLUCOSAMINE CHOND COMPLEX/MSM PO) Take by mouth. Occasionally   Yes [provider]  Multiple Vitamin (MULTIVITAMIN) tablet Take 1 tablet by mouth. occasionally   Yes [provider]  pantoprazole (PROTONIX) 40 MG tablet Take 1 tablet (40 mg total) by mouth daily. 07/30/18  Yes [name], MD  triamterene-hydrochlorothiazide (MAXZIDE-25) 37.5-25 MG tablet TAKE 1 TABLET BY MOUTH EVERY DAY 07/09/18  Yes [name], MD  zolpidem (AMBIEN) 10 MG tablet TAKE 1 TABLET BY MOUTH EVERY DAY AT BEDTIME AS NEEDED 11/08/17  Yes Orlena Sheldon, PA-C    nitroGLYCERIN (NITROSTAT) 0.4 MG SL tablet Place 1 tablet (0.4 mg total) under the tongue every 5 (five) minutes as needed for chest pain. Patient not taking: Reported on 08/08/2018 07/09/18   Pixie Casino, MD  omeprazole (PRILOSEC OTC) 20 MG tablet Take 1 tablet (20 mg total) by mouth daily. 01/08/13 03/24/13  [name], MD     Allergies  Allergen Reactions  . Ciprofloxacin   . Citalopram Hydrobromide   . Escitalopram Oxalate   . Paroxetine   . Polysporin [Bacitracin-Polymyxin B]      Family History  Problem Relation Age of Onset  . Diabetes Sister   . Pancreatic cancer Brother   . Ovarian cancer Mother   . Other Father        homicide  . Heart attack Brother        x2  . Hypertension Brother   . Hypertension Sister      Social History   Socioeconomic History  . Marital status: Single    Spouse name: Not on file  . Number of children: 1  . Years of education: Not on file  . Highest education level: Not on file  Occupational History  . Occupation: Biomedical engineer  Social Needs  . Financial resource strain: Not on file  . Food insecurity:    Worry: Not on file    Inability: Not on file  . Transportation needs:    Medical: Not on file    Non-medical: Not on file  Tobacco Use  . Smoking status: Former Smoker    Last attempt to quit: 12/20/2001    Years since quitting: 16.6  . Smokeless tobacco: Never Used  Substance and Sexual Activity  . Alcohol use: No    Alcohol/week: 0.0 standard drinks  . Drug use: No  . Sexual activity: Not on file  Lifestyle  . Physical activity:    Days per week: Not on file    Minutes per session: Not on file  . Stress: Not on file  Relationships  . Social connections:    Talks on phone: Not on file    Gets together: Not on file    Attends religious service: Not on file    Active member of club or organization: Not on file    Attends meetings of clubs or organizations: Not on file    Relationship status: Not on file   . Intimate partner violence:    Fear of current or ex partner: Not on file    Emotionally abused: Not on file    Physically abused: Not on file    Forced sexual activity: Not on file  Other Topics Concern  . Not on file  Social History Narrative  . Not on file     Review of Systems  Constitutional: Positive for appetite change and fatigue. Negative for activity change, chills, diaphoresis, fever and unexpected weight change.  HENT: Positive  for congestion, postnasal drip, rhinorrhea, sinus pressure, sinus pain and voice change. Negative for ear discharge, ear pain, facial swelling and trouble swallowing.   Eyes: Negative.  Negative for pain.  Respiratory: Negative.  Negative for apnea, choking and stridor.   Cardiovascular: Negative.  Negative for chest pain, palpitations and leg swelling.  Gastrointestinal: Negative.   Endocrine: Negative.   Genitourinary: Negative.   Musculoskeletal: Negative.  Negative for back pain and myalgias.  Skin: Negative.   Allergic/Immunologic: Positive for environmental allergies.  Neurological: Negative.  Negative for dizziness, syncope, light-headedness and headaches.  Hematological: Negative.  Negative for adenopathy.  Psychiatric/Behavioral: Negative.  Negative for confusion.  All other systems reviewed and are negative.      Objective:    Vitals:   08/08/18 1213  BP: 130/74  Pulse: 62  Resp: 15  Temp: 97.6 F (36.4 C)  TempSrc: Oral  SpO2: 100%  Weight: 212 lb 8 oz (96.4 kg)  Height: 5\' 6"  (1.676 m)      Physical Exam Vitals signs and nursing note reviewed.  Constitutional:      General: She is not in acute distress.    Appearance: Normal appearance. She is well-developed. She is not ill-appearing, toxic-appearing or diaphoretic.  HENT:     Head: Normocephalic and atraumatic.     Right Ear: Tympanic membrane, ear canal and external ear normal.     Left Ear: Tympanic membrane, ear canal and external ear normal.     Nose:  Mucosal edema, congestion and rhinorrhea present.     Right Nostril: No epistaxis.     Left Nostril: No epistaxis.     Right Turbinates: Enlarged, swollen and pale.     Left Turbinates: Enlarged, swollen and pale.     Right Sinus: No maxillary sinus tenderness or frontal sinus tenderness.     Left Sinus: No maxillary sinus tenderness or frontal sinus tenderness.     Mouth/Throat:     Mouth: Mucous membranes are moist. Mucous membranes are not pale.     Pharynx: Uvula midline. Posterior oropharyngeal erythema present. No pharyngeal swelling, oropharyngeal exudate or uvula swelling.     Tonsils: No tonsillar exudate or tonsillar abscesses.  Eyes:     General:        Right eye: No discharge.        Left eye: No discharge.     Conjunctiva/sclera: Conjunctivae normal.     Pupils: Pupils are equal, round, and reactive to light.  Neck:     Musculoskeletal: Normal range of motion and neck supple.     Trachea: No tracheal deviation.  Cardiovascular:     Rate and Rhythm: Normal rate and regular rhythm.     Heart sounds: Normal heart sounds. No murmur. No friction rub. No gallop.   Pulmonary:     Effort: Pulmonary effort is normal. No respiratory distress.     Breath sounds: No stridor. Rhonchi present. No wheezing or rales.     Comments: Intermittent wet and harsh sounding cough, scattered rhonchi, BS anteriorly coarse, posteriorly no wheeze or rales No retractions or accessory muscle use Abdominal:     General: Bowel sounds are normal. There is no distension.     Palpations: Abdomen is soft.     Tenderness: There is no abdominal tenderness.  Musculoskeletal: Normal range of motion.  Skin:    General: Skin is warm and dry.     Coloration: Skin is not pale.     Findings: No rash.  Neurological:  Mental Status: She is alert.     Motor: No abnormal muscle tone.     Coordination: Coordination normal.  Psychiatric:        Behavior: Behavior normal.            Assessment & Plan:       ICD-10-CM   1. Acute pansinusitis, recurrence not specified J01.40   2. Acute bronchitis, unspecified organism J20.9   3. Environmental and seasonal allergies J30.89      Patient is a 72 year old female with history of severe allergies requiring allergy injections, she has a history of bronchitis and has been Xopenex with multiple refills at home she denies any history of asthma or COPD, she is a former smoker, she presented with at least 3 to 4 weeks of URI illness, 3 weeks ago because of getting sick she was unable to get her allergy injections and so she has been ill and gradually worsening for the past 3 weeks.  She reports severe sinus pain and pressure with purulent and bloody nasal discharge and productive cough with occasional wheeze and shortness of breath and generally feeling ill and very fatigued.    In clinic she is well-appearing, vital signs are stable, no respiratory distress. Good breath sounds in all lung fields with scattered rhonchi and congestion auscultated anteriorly, no wheeze or rales auscultated posteriorly in all lung fields.  She has no chest pain, sweats, fever.  We will treat her with steroids and antibiotics to cover acute bacterial sinusitis and will treat for acute bronchitis with cough medicines, Mucinex, inhaler use with prednisone burst.  She used to be on more allergy medications but as she got injection she was able to discontinue some.  While she is currently unable to get her allergy shots I have encouraged her to make sure she is on a daily antihistamine, steroid nasal spray and add montelukast until she is able to get back to her allergy doctor.    Encouraged her to follow-up with any worsening of her symptoms or if she does not improve gradually in the next 1 to 2 weeks   Delsa Grana, PA-C 08/08/18 12:33 PM

## 2018-08-12 ENCOUNTER — Telehealth: Payer: Self-pay | Admitting: Physician Assistant

## 2018-08-12 NOTE — Telephone Encounter (Signed)
Patient is calling to know, since she had taken antibotics could leisa please call her in a rx for diflucan for a yeast infection  [facility] [address]

## 2018-08-14 MED ORDER — FLUCONAZOLE 150 MG PO TABS: 150.0000 mg | ORAL_TABLET | ORAL | 1 refills | Status: DC | PRN

## 2018-08-14 NOTE — Telephone Encounter (Signed)
Meds were sent in

## 2018-08-14 NOTE — Telephone Encounter (Signed)
Recent visit, meds and liver function checked - diflucan sent to pharmacy for yeast infection secondary to oral antibiotic use.  Delsa Grana, PA-C

## 2018-08-15 ENCOUNTER — Other Ambulatory Visit: Payer: Self-pay | Admitting: Internal Medicine

## 2018-08-15 NOTE — Telephone Encounter (Signed)
Spoke with patient and informed her that we sent in diflucan. 

## 2018-08-16 ENCOUNTER — Other Ambulatory Visit: Payer: Self-pay | Admitting: *Deleted

## 2018-08-16 MED ORDER — FLUCONAZOLE 150 MG PO TABS: 150.0000 mg | ORAL_TABLET | ORAL | 1 refills | Status: DC | PRN

## 2018-08-23 ENCOUNTER — Ambulatory Visit (INDEPENDENT_AMBULATORY_CARE_PROVIDER_SITE_OTHER): Payer: Medicare Other

## 2018-08-23 DIAGNOSIS — J309 Allergic rhinitis, unspecified: Secondary | ICD-10-CM | POA: Diagnosis not present

## 2018-09-02 ENCOUNTER — Ambulatory Visit (INDEPENDENT_AMBULATORY_CARE_PROVIDER_SITE_OTHER): Payer: Medicare Other | Admitting: *Deleted

## 2018-09-02 DIAGNOSIS — J309 Allergic rhinitis, unspecified: Secondary | ICD-10-CM | POA: Diagnosis not present

## 2018-09-05 ENCOUNTER — Ambulatory Visit: Payer: Medicare Other | Admitting: Physician Assistant

## 2018-09-10 ENCOUNTER — Other Ambulatory Visit: Payer: Self-pay | Admitting: Allergy and Immunology

## 2018-09-13 ENCOUNTER — Ambulatory Visit (INDEPENDENT_AMBULATORY_CARE_PROVIDER_SITE_OTHER): Payer: Medicare Other | Admitting: Family Medicine

## 2018-09-13 ENCOUNTER — Encounter: Payer: Self-pay | Admitting: Family Medicine

## 2018-09-13 VITALS — BP 136/90 | HR 66 | Resp 16 | Ht 66.0 in | Wt 221.0 lb

## 2018-09-13 DIAGNOSIS — J309 Allergic rhinitis, unspecified: Secondary | ICD-10-CM

## 2018-09-13 DIAGNOSIS — T783XXD Angioneurotic edema, subsequent encounter: Secondary | ICD-10-CM

## 2018-09-13 MED ORDER — FLUTICASONE PROPIONATE 50 MCG/ACT NA SUSP: 1.0000 | Freq: Two times a day (BID) | NASAL | 0 refills | Status: DC

## 2018-09-13 NOTE — Patient Instructions (Addendum)
Allergic rhinitis Continue Flonase 1 spray in each nostril 1-2 times a day Consider saline nasal rinses as needed for nasal symptoms Continue allergen immunotherapy as scheduled Continue allergen avoidance measures   Angioedema If your symptoms re-occur, begin a journal of events that occurred for up to 6 hours before your symptoms began including foods and beverages consumed, soaps or perfumes you had contact with, and medications.   Call the clinic if this treatment plan is not working well for you.  Follow up in 1 year or sooner if needed

## 2018-09-13 NOTE — Progress Notes (Signed)
[facility] [place] [id number] Dept: [phone]  FOLLOW UP NOTE  Patient ID: Madeline Wood, female    DOB: August 04, 1946  Age: 73 y.o. MRN: [id number] Date of Office Visit: 09/13/2018  Assessment  Chief Complaint: Allergic Rhinitis   HPI Madeline Wood is a 73 year old female who presents to the clinic for a follow up visit. She was last seen in this clinic on 01/05/2016 by Dr. [name] for a food challenge to peanut. At that time, she was able to tolerate the peanut challenge. At today's visit, she reports her allergies have been well controlled with daily Flonase and allergen immunotherapy. She reports that she recently dislocated her left shoulder which resulted in her not being able to travel to the office regularly for allergy immunotherapy, however, she is slowly improving and it is now easier for her to travel. She reports a significant decrease in her allergy symptoms while continuing allergen immunotherapy. She denies any incidences of angioedema since her alst visit to this clinic. Her current medications are listed in the chart.    Drug Allergies:  Allergies  Allergen Reactions  . Ciprofloxacin   . Citalopram Hydrobromide   . Escitalopram Oxalate   . Paroxetine   . Polysporin [Bacitracin-Polymyxin B]     Physical Exam: BP 136/90 (BP Location: Left Arm, Patient Position: Sitting, Cuff Size: Normal)   Pulse 66   Resp 16    Physical Exam Vitals signs reviewed.  Constitutional:      Appearance: Normal appearance.  HENT:     Head: Normocephalic and atraumatic.     Right Ear: Tympanic membrane normal.     Left Ear: Tympanic membrane normal.     Nose:     Comments: Bilateral nares slightly erythematous with no nasal drainage noted.  Pharynx normal.  Ears normal.  Eyes normal.    Mouth/Throat:     Pharynx: Oropharynx is clear.  Eyes:     Conjunctiva/sclera: Conjunctivae normal.  Cardiovascular:     Rate and Rhythm: Normal rate and regular rhythm.   Heart sounds: Normal heart sounds. No murmur.  Pulmonary:     Effort: Pulmonary effort is normal.     Breath sounds: Normal breath sounds.     Comments: Lungs clear to auscultation Musculoskeletal: Normal range of motion.  Skin:    General: Skin is warm and dry.  Neurological:     Mental Status: She is alert and oriented to person, place, and time.  Psychiatric:        Mood and Affect: Mood normal.        Behavior: Behavior normal.        Thought Content: Thought content normal.        Judgment: Judgment normal.     Assessment and Plan: 1. Allergic rhinitis, unspecified seasonality, unspecified trigger   2. Angioedema, subsequent encounter     Meds ordered this encounter  Medications  . fluticasone (FLONASE) 50 MCG/ACT nasal spray    Sig: Place 1 spray into both nostrils 2 (two) times daily.    Dispense:  48 g    Refill:  0    ONE REFILL ONLY (90 DAYS SUPPLY).  PATIENT NEEDS OV MAY 2018.    Patient Instructions  Allergic rhinitis Continue Flonase 1 spray in each nostril 1-2 times a day Consider saline nasal rinses as needed for nasal symptoms Continue allergen immunotherapy as scheduled Continue allergen avoidance measures   Angioedema If your symptoms re-occur, begin a journal of events  that occurred for up to 6 hours before your symptoms began including foods and beverages consumed, soaps or perfumes you had contact with, and medications.   Call the clinic if this treatment plan is not working well for you.  Follow up in 1 year or sooner if needed   Return in about 1 year (around 09/14/2019), or if symptoms worsen or fail to improve.    Thank you for the opportunity to care for this patient.  Please do not hesitate to contact me with questions.  Gareth Morgan, FNP [facility]

## 2018-09-19 DIAGNOSIS — Z1231 Encounter for screening mammogram for malignant neoplasm of breast: Secondary | ICD-10-CM | POA: Diagnosis not present

## 2018-09-19 LAB — HM MAMMOGRAPHY

## 2018-09-20 ENCOUNTER — Ambulatory Visit (INDEPENDENT_AMBULATORY_CARE_PROVIDER_SITE_OTHER): Payer: Medicare Other

## 2018-09-20 DIAGNOSIS — J309 Allergic rhinitis, unspecified: Secondary | ICD-10-CM

## 2018-09-27 ENCOUNTER — Ambulatory Visit (INDEPENDENT_AMBULATORY_CARE_PROVIDER_SITE_OTHER): Payer: Medicare Other | Admitting: *Deleted

## 2018-09-27 DIAGNOSIS — J309 Allergic rhinitis, unspecified: Secondary | ICD-10-CM

## 2018-09-30 ENCOUNTER — Encounter: Payer: Self-pay | Admitting: *Deleted

## 2018-09-30 ENCOUNTER — Other Ambulatory Visit: Payer: Self-pay | Admitting: Family Medicine

## 2018-10-01 ENCOUNTER — Other Ambulatory Visit: Payer: Self-pay | Admitting: Internal Medicine

## 2018-10-01 ENCOUNTER — Encounter: Payer: Self-pay | Admitting: Sports Medicine

## 2018-10-01 ENCOUNTER — Ambulatory Visit (INDEPENDENT_AMBULATORY_CARE_PROVIDER_SITE_OTHER): Payer: Medicare Other | Admitting: Sports Medicine

## 2018-10-01 DIAGNOSIS — E119 Type 2 diabetes mellitus without complications: Secondary | ICD-10-CM | POA: Diagnosis not present

## 2018-10-01 DIAGNOSIS — M79674 Pain in right toe(s): Secondary | ICD-10-CM

## 2018-10-01 DIAGNOSIS — I2510 Atherosclerotic heart disease of native coronary artery without angina pectoris: Secondary | ICD-10-CM

## 2018-10-01 DIAGNOSIS — M79675 Pain in left toe(s): Secondary | ICD-10-CM

## 2018-10-01 DIAGNOSIS — B351 Tinea unguium: Secondary | ICD-10-CM

## 2018-10-01 DIAGNOSIS — Z9229 Personal history of other drug therapy: Secondary | ICD-10-CM

## 2018-10-01 DIAGNOSIS — I739 Peripheral vascular disease, unspecified: Secondary | ICD-10-CM

## 2018-10-01 NOTE — Progress Notes (Signed)
Subjective: Madeline Wood is a 73 y.o. female patient seen today in office with complaint of mildly painful thickened and elongated toenails; unable to trim due to R shoulder dislocation like before. Patient still on ASA treatment. Patient has no other pedal complaints at this time.    Patient Active Problem List   Diagnosis Date Noted  . Allergic rhinitis 09/13/2018  . Bilateral high frequency sensorineural hearing loss 01/17/2018  . Subjective tinnitus of left ear 01/17/2018  . Atherosclerosis of native coronary artery of native heart without angina pectoris 07/23/2017  . History of food allergy 01/05/2016  . Angio-edema 11/17/2015  . Allergic rhinoconjunctivitis, seasonal and perennial 05/04/2015  . Insomnia 08/24/2014  . Bilateral leg edema 04/17/2014  . Diabetes mellitus without complication ([place])   . Essential hypertension   . Gout   . Mixed hyperlipidemia   . 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 Anxiety   . Obesity   . Knee pain   . Positive TB test   . Hiatal hernia   . GERD (gastroesophageal reflux disease)   . Coronary atherosclerosis 06/06/2010  . GERD 06/06/2010  . History of colonic polyps 06/06/2010    Current Outpatient Medications on File Prior to Visit  Medication Sig Dispense Refill  . acetaminophen (TYLENOL) 325 MG tablet Take 2 tablets (650 mg total) by mouth every 6 (six) hours as needed. 30 tablet 0  . ALPRAZolam (XANAX) 1 MG tablet TAKE 1 TABLET BY MOUTH TWICE A DAY 60 tablet 2  . aspirin 81 MG tablet Take 81 mg by mouth daily.      [name] atorvastatin (LIPITOR) 80 MG tablet TAKE 1 TABLET BY MOUTH EVERY DAY 90 tablet 3  . fexofenadine (ALLEGRA) 180 MG tablet Take 1 tablet (180 mg total) by mouth daily. 30 tablet 5  . fish oil-omega-3 fatty acids 1000 MG capsule Take 1 g by mouth. Takes occasionally    . fluticasone (FLONASE) 50 MCG/ACT nasal spray Place 1 spray into both nostrils 2 (two) times daily. 48 g 0  . levalbuterol (XOPENEX HFA) 45 MCG/ACT inhaler Inhale 2 puffs  every 4 (four) hours as needed into the lungs for wheezing. 1 Inhaler 12  . losartan (COZAAR) 25 MG tablet TAKE 2 TABLETS (50MG ) BY MOUTH EVERY DAY 60 tablet 6  . meloxicam (MOBIC) 7.5 MG tablet TAKE 1 TABLET BY MOUTH EVERY DAY 30 tablet 0  . Misc Natural Products (GLUCOSAMINE CHOND COMPLEX/MSM PO) Take by mouth. Occasionally    . Multiple Vitamin (MULTIVITAMIN) tablet Take 1 tablet by mouth. occasionally    . pantoprazole (PROTONIX) 40 MG tablet Take 1 tablet (40 mg total) by mouth daily. 90 tablet 0  . triamterene-hydrochlorothiazide (MAXZIDE-25) 37.5-25 MG tablet TAKE 1 TABLET BY MOUTH EVERY DAY 90 tablet 2  . zolpidem (AMBIEN) 10 MG tablet TAKE 1 TABLET BY MOUTH EVERY DAY AT BEDTIME AS NEEDED 30 tablet 5  . [DISCONTINUED] omeprazole (PRILOSEC OTC) 20 MG tablet Take 1 tablet (20 mg total) by mouth daily. 30 tablet 1   No current facility-administered medications on file prior to visit.     Allergies  Allergen Reactions  . Ciprofloxacin   . Citalopram Hydrobromide   . Escitalopram Oxalate   . Paroxetine   . Polysporin [Bacitracin-Polymyxin B]     Objective: Physical Exam  General: Well developed, nourished, no acute distress, awake, alert and oriented x 3  Vascular: Dorsalis pedis artery 1/4 bilateral, Posterior tibial artery 1/4 bilateral, skin temperature warm to warm proximal to distal bilateral  lower extremities, no varicosities, pedal hair present bilateral.  Neurological: Gross sensation present via light touch bilateral.   Dermatological: Skin is warm, dry, and supple bilateral, Nails 1-10 are tender, long, thick, and discolored with mild subungal debris, no webspace macerations present bilateral, no open lesions present bilateral, no callus/corns/hyperkeratotic tissue present bilateral. No signs of infection bilateral.  Musculoskeletal: Asymptomatic pes planus and bunion boney deformities noted bilateral. Muscular strength within normal limits without painon range of  motion. No pain with calf compression bilateral.  Assessment and Plan:  Problem List Items Addressed This Visit      Endocrine   Diabetes mellitus without complication ([place])    Other Visit Diagnoses    Pain due to onychomycosis of toenails of both feet    -  Primary   PVD (peripheral vascular disease) ([place])       HX: long term anticoagulant use         -Examined patient.  -Discussed treatment options for painful mycotic nails. -Mechanically debrided and reduced mycotic nails with sterile nail nipper and dremel nail file without incident. -Continue with clotrimazole cream and fungi-nail as previous as things continue to grow out  -Patient to return in 3 months for follow up evaluation or sooner if symptoms worsen.  [name], [name]

## 2018-10-03 ENCOUNTER — Telehealth: Payer: Self-pay | Admitting: Family Medicine

## 2018-10-03 NOTE — Telephone Encounter (Signed)
Pt called and said that she is having coughing and nose running and eyes running water but everything is clear and wanted to know if is can take the singulair that she has or needs something else. [facility] [address] [phone].

## 2018-10-03 NOTE — Telephone Encounter (Signed)
Please advise 

## 2018-10-04 NOTE — Telephone Encounter (Signed)
Called patient and advised. Advised patient to not come back for allergy shot until she is feeling better. Patient verbalized understanding.

## 2018-10-04 NOTE — Telephone Encounter (Signed)
She can take montelukast 10 mg once a day. Thank you. Please have her follow up if her condition worsens or does not improve.

## 2018-10-11 ENCOUNTER — Ambulatory Visit (INDEPENDENT_AMBULATORY_CARE_PROVIDER_SITE_OTHER): Payer: Medicare Other | Admitting: *Deleted

## 2018-10-11 DIAGNOSIS — J309 Allergic rhinitis, unspecified: Secondary | ICD-10-CM

## 2018-10-15 ENCOUNTER — Ambulatory Visit (INDEPENDENT_AMBULATORY_CARE_PROVIDER_SITE_OTHER): Payer: Medicare Other | Admitting: Family Medicine

## 2018-10-15 VITALS — BP 120/70 | HR 62 | Temp 97.5°F | Resp 16 | Ht 66.0 in | Wt 212.0 lb

## 2018-10-15 DIAGNOSIS — R300 Dysuria: Secondary | ICD-10-CM | POA: Diagnosis not present

## 2018-10-15 DIAGNOSIS — I2510 Atherosclerotic heart disease of native coronary artery without angina pectoris: Secondary | ICD-10-CM | POA: Diagnosis not present

## 2018-10-15 DIAGNOSIS — E782 Mixed hyperlipidemia: Secondary | ICD-10-CM

## 2018-10-15 DIAGNOSIS — E119 Type 2 diabetes mellitus without complications: Secondary | ICD-10-CM

## 2018-10-15 DIAGNOSIS — I10 Essential (primary) hypertension: Secondary | ICD-10-CM

## 2018-10-15 LAB — URINALYSIS, ROUTINE W REFLEX MICROSCOPIC
Bacteria, UA: NONE SEEN /HPF
Bilirubin Urine: NEGATIVE
Glucose, UA: NEGATIVE
Ketones, ur: NEGATIVE
Leukocytes,Ua: NEGATIVE
Nitrite: NEGATIVE
Protein, ur: NEGATIVE
Specific Gravity, Urine: 1.01 (ref 1.001–1.03)
WBC, UA: NONE SEEN /HPF (ref 0–5)
pH: 7 (ref 5.0–8.0)

## 2018-10-15 LAB — MICROSCOPIC MESSAGE

## 2018-10-15 NOTE — Progress Notes (Signed)
Subjective:    Patient ID: Madeline Wood, female    DOB: 01-18-1946, 73 y.o.   MRN: [id number]  HPI   Patient is a very pleasant 73 year old African-American female who is here today for a checkup.  Immunizations are up-to-date.  Mammogram was just completed.  She believes she had a Cologuard performed 2 years ago.  She is not due for her next Cologuard until next year.  Past medical history significant for coronary artery disease with a stent put in her distal RCA in 2006.  She is currently on Lipitor 80 mg a day as well as aspirin.  She denies any angina or chest pain or shortness of breath.  Her blood pressure today is well controlled at 120/70.  She is on a beta-blocker.  She also has a history of diet-controlled diabetes mellitus.  She denies any polyuria, polydipsia, or blurry vision.  She denies any neuropathy in her feet.  She denies any myalgias or right upper quadrant pain on her statin.  She does have occasional mild dysuria over the last 2 days.  However urinalysis today shows only trace blood.  There is no nitrites.  There is no leukocyte esterase.  Urinalysis does not suggest urinary tract infection  Past Medical History:  Diagnosis Date  . 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 Anxiety   . CAD (coronary artery disease)    stent of distal RCA in 2006  . Diabetes mellitus without complication ([place])   . Diverticulosis   . GERD (gastroesophageal reflux disease)   . Gout   . Hiatal hernia   . History of nuclear stress test 03/2011   negative lexiscan myoview; normal perfusion  . Hyperlipidemia   . Hypertension   . Knee pain   . Obesity   . Positive TB test   . Venous insufficiency    Past Surgical History:  Procedure Laterality Date  . ABDOMINAL HYSTERECTOMY    . BACK SURGERY  2012  . BREAST LUMPECTOMY    . CORONARY ANGIOPLASTY WITH STENT PLACEMENT  06/29/2005   Taxus 2.5x35mm DES to distal RCA (Dr. Tami Ribas)  . TRANSTHORACIC ECHOCARDIOGRAM  12/20/2012   EF 40-98%, normal systolic  function, mild hypokinesis of inf myocardium; calcified MV annulua, LA & RA mildly dilated   Current Outpatient Medications on File Prior to Visit  Medication Sig Dispense Refill  . ALPRAZolam (XANAX) 1 MG tablet TAKE 1 TABLET BY MOUTH TWICE A DAY 60 tablet 2  . aspirin 81 MG tablet Take 81 mg by mouth daily.      [name] atorvastatin (LIPITOR) 80 MG tablet TAKE 1 TABLET BY MOUTH EVERY DAY 90 tablet 3  . fexofenadine (ALLEGRA) 180 MG tablet Take 1 tablet (180 mg total) by mouth daily. 30 tablet 5  . fish oil-omega-3 fatty acids 1000 MG capsule Take 1 g by mouth. Takes occasionally    . fluticasone (FLONASE) 50 MCG/ACT nasal spray Place 1 spray into both nostrils 2 (two) times daily. 48 g 0  . levalbuterol (XOPENEX HFA) 45 MCG/ACT inhaler Inhale 2 puffs every 4 (four) hours as needed into the lungs for wheezing. 1 Inhaler 12  . losartan (COZAAR) 25 MG tablet TAKE 2 TABLETS (50MG ) BY MOUTH EVERY DAY 60 tablet 6  . meloxicam (MOBIC) 7.5 MG tablet TAKE 1 TABLET BY MOUTH EVERY DAY 30 tablet 0  . metoprolol tartrate (LOPRESSOR) 50 MG tablet TAKE 1 TABLET EVERY MORNING AND 1 AND 1/2 TABLET EVERY EVENING 225 tablet 1  . Misc Natural  Products (GLUCOSAMINE CHOND COMPLEX/MSM PO) Take by mouth. Occasionally    . Multiple Vitamin (MULTIVITAMIN) tablet Take 1 tablet by mouth. occasionally    . nitroGLYCERIN (NITROSTAT) 0.4 MG SL tablet PLACE 1 TABLET UNDER THE TONGUE EVERY 5 (FIVE) MINUTES AS NEEDED FOR CHEST PAIN. 75 tablet 1  . pantoprazole (PROTONIX) 40 MG tablet Take 1 tablet (40 mg total) by mouth daily. 90 tablet 0  . triamterene-hydrochlorothiazide (MAXZIDE-25) 37.5-25 MG tablet TAKE 1 TABLET BY MOUTH EVERY DAY 90 tablet 2  . zolpidem (AMBIEN) 10 MG tablet TAKE 1 TABLET BY MOUTH EVERY DAY AT BEDTIME AS NEEDED 30 tablet 5  . [DISCONTINUED] omeprazole (PRILOSEC OTC) 20 MG tablet Take 1 tablet (20 mg total) by mouth daily. 30 tablet 1   No current facility-administered medications on file prior to visit.     Allergies  Allergen Reactions  . Ciprofloxacin   . Citalopram Hydrobromide   . Escitalopram Oxalate   . Paroxetine   . Polysporin [Bacitracin-Polymyxin B]    Social History   Socioeconomic History  . Marital status: Single    Spouse name: Not on file  . Number of children: 1  . Years of education: Not on file  . Highest education level: Not on file  Occupational History  . Occupation: Biomedical engineer  Social Needs  . Financial resource strain: Not on file  . Food insecurity:    Worry: Not on file    Inability: Not on file  . Transportation needs:    Medical: Not on file    Non-medical: Not on file  Tobacco Use  . Smoking status: Former Smoker    Last attempt to quit: 12/20/2001    Years since quitting: 16.8  . Smokeless tobacco: Never Used  Substance and Sexual Activity  . Alcohol use: No    Alcohol/week: 0.0 standard drinks  . Drug use: No  . Sexual activity: Not on file  Lifestyle  . Physical activity:    Days per week: Not on file    Minutes per session: Not on file  . Stress: Not on file  Relationships  . Social connections:    Talks on phone: Not on file    Gets together: Not on file    Attends religious service: Not on file    Active member of club or organization: Not on file    Attends meetings of clubs or organizations: Not on file    Relationship status: Not on file  . Intimate partner violence:    Fear of current or ex partner: Not on file    Emotionally abused: Not on file    Physically abused: Not on file    Forced sexual activity: Not on file  Other Topics Concern  . Not on file  Social History Narrative  . Not on file     Review of Systems  All other systems reviewed and are negative.      Objective:   Physical Exam Constitutional:      General: She is not in acute distress.    Appearance: Normal appearance. She is not ill-appearing, toxic-appearing or diaphoretic.  Cardiovascular:     Rate and Rhythm: Normal rate and regular  rhythm.     Pulses: Normal pulses.     Heart sounds: Normal heart sounds. No murmur. No friction rub. No gallop.   Pulmonary:     Effort: Pulmonary effort is normal. No respiratory distress.     Breath sounds: Normal breath sounds. No stridor. No wheezing,  rhonchi or rales.  Abdominal:     General: Bowel sounds are normal. There is no distension.     Palpations: Abdomen is soft.     Tenderness: There is no abdominal tenderness. There is no rebound.  Musculoskeletal:     Right lower leg: No edema.     Left lower leg: No edema.  Neurological:     Mental Status: She is alert.           Assessment & Plan:  Dysuria - Plan: Urinalysis, Routine w reflex microscopic  Atherosclerosis of native coronary artery of native heart without angina pectoris  Essential hypertension  Mixed hyperlipidemia  Diabetes mellitus without complication ([place]) - Plan: Hemoglobin A1c, CBC with Differential/Platelet, COMPLETE METABOLIC PANEL WITH GFR, Lipid panel  Aside from trace hematuria urinalysis looks normal.  I see no indication for treatment of urinary tract infection.  I would monitor the patient clinically and repeat a urinalysis in 1 to 2 weeks to ensure resolution of the hematuria.  Given her history of coronary artery disease, I would like to check a fasting lipid panel.  Goal LDL cholesterol is less than 70.  Her blood pressure today is well controlled at 120/70 and she is compliant with her beta-blocker.  Given her diet-controlled diabetes mellitus I will check a hemoglobin A1c.  Goal hemoglobin A1c is less than 6.5.  If greater than 6.5, consider Jardiance for diabetes and also for the cardioprotective effect.  Also check a CBC, CMP.  Immunizations are up-to-date.  Cancer screening is up-to-date.

## 2018-10-16 LAB — CBC WITH DIFFERENTIAL/PLATELET
Absolute Monocytes: 597 cells/uL (ref 200–950)
Basophils Absolute: 52 cells/uL (ref 0–200)
Basophils Relative: 0.9 %
Eosinophils Absolute: 81 cells/uL (ref 15–500)
Eosinophils Relative: 1.4 %
HCT: 37.5 % (ref 35.0–45.0)
Hemoglobin: 12.8 g/dL (ref 11.7–15.5)
Lymphs Abs: 2598 cells/uL (ref 850–3900)
MCH: 30.5 pg (ref 27.0–33.0)
MCHC: 34.1 g/dL (ref 32.0–36.0)
MCV: 89.5 fL (ref 80.0–100.0)
MPV: 8.8 fL (ref 7.5–12.5)
Monocytes Relative: 10.3 %
Neutro Abs: 2471 cells/uL (ref 1500–7800)
Neutrophils Relative %: 42.6 %
Platelets: 255 10*3/uL (ref 140–400)
RBC: 4.19 10*6/uL (ref 3.80–5.10)
RDW: 12.4 % (ref 11.0–15.0)
Total Lymphocyte: 44.8 %
WBC: 5.8 10*3/uL (ref 3.8–10.8)

## 2018-10-16 LAB — COMPLETE METABOLIC PANEL WITH GFR
AG Ratio: 1.4 (calc) (ref 1.0–2.5)
ALT: 10 U/L (ref 6–29)
AST: 23 U/L (ref 10–35)
Albumin: 4 g/dL (ref 3.6–5.1)
Alkaline phosphatase (APISO): 42 U/L (ref 37–153)
BUN: 10 mg/dL (ref 7–25)
CO2: 29 mmol/L (ref 20–32)
Calcium: 10 mg/dL (ref 8.6–10.4)
Chloride: 91 mmol/L — ABNORMAL LOW (ref 98–110)
Creat: 0.7 mg/dL (ref 0.60–0.93)
GFR, Est African American: 100 mL/min/{1.73_m2} (ref >=60)
GFR, Est Non African American: 86 mL/min/{1.73_m2} (ref >=60)
Globulin: 2.9 g/dL (calc) (ref 1.9–3.7)
Glucose, Bld: 97 mg/dL (ref 65–99)
Potassium: 3.8 mmol/L (ref 3.5–5.3)
Sodium: 129 mmol/L — ABNORMAL LOW (ref 135–146)
Total Bilirubin: 0.8 mg/dL (ref 0.2–1.2)
Total Protein: 6.9 g/dL (ref 6.1–8.1)

## 2018-10-16 LAB — LIPID PANEL
Cholesterol: 152 mg/dL (ref <200)
HDL: 62 mg/dL (ref >=50)
LDL Cholesterol (Calc): 77 mg/dL (calc)
Non-HDL Cholesterol (Calc): 90 mg/dL (calc) (ref <130)
Total CHOL/HDL Ratio: 2.5 (calc) (ref <5.0)
Triglycerides: 54 mg/dL (ref <150)

## 2018-10-16 LAB — HEMOGLOBIN A1C
Hgb A1c MFr Bld: 6 % of total Hgb — ABNORMAL HIGH (ref <5.7)
Mean Plasma Glucose: 126 (calc)
eAG (mmol/L): 7 (calc)

## 2018-10-17 ENCOUNTER — Other Ambulatory Visit: Payer: Self-pay | Admitting: Family Medicine

## 2018-10-17 DIAGNOSIS — E871 Hypo-osmolality and hyponatremia: Secondary | ICD-10-CM

## 2018-10-18 ENCOUNTER — Other Ambulatory Visit: Payer: Self-pay | Admitting: Family Medicine

## 2018-10-21 ENCOUNTER — Encounter: Payer: Self-pay | Admitting: *Deleted

## 2018-10-21 ENCOUNTER — Other Ambulatory Visit: Payer: Self-pay | Admitting: Family Medicine

## 2018-10-21 MED ORDER — BENZONATATE 100 MG PO CAPS: 100.0000 mg | ORAL_CAPSULE | Freq: Three times a day (TID) | ORAL | 0 refills | Status: DC | PRN

## 2018-10-21 MED ORDER — PANTOPRAZOLE SODIUM 40 MG PO TBEC: 40.0000 mg | DELAYED_RELEASE_TABLET | Freq: Every day | ORAL | 0 refills | Status: DC

## 2018-10-21 NOTE — Telephone Encounter (Signed)
Pt called and would like a refill on Tessalon for her cough? Ok to refill?

## 2018-10-21 NOTE — Progress Notes (Signed)
This encounter was created in error - please disregard.

## 2018-10-23 ENCOUNTER — Other Ambulatory Visit: Payer: Self-pay | Admitting: Internal Medicine

## 2018-10-23 DIAGNOSIS — I2510 Atherosclerotic heart disease of native coronary artery without angina pectoris: Secondary | ICD-10-CM

## 2018-10-25 ENCOUNTER — Other Ambulatory Visit: Payer: Medicare Other

## 2018-10-25 DIAGNOSIS — E871 Hypo-osmolality and hyponatremia: Secondary | ICD-10-CM

## 2018-10-26 LAB — BASIC METABOLIC PANEL
BUN: 18 mg/dL (ref 7–25)
CO2: 28 mmol/L (ref 20–32)
Calcium: 9.4 mg/dL (ref 8.6–10.4)
Chloride: 100 mmol/L (ref 98–110)
Creat: 0.82 mg/dL (ref 0.60–0.93)
Glucose, Bld: 104 mg/dL — ABNORMAL HIGH (ref 65–99)
Potassium: 4 mmol/L (ref 3.5–5.3)
Sodium: 137 mmol/L (ref 135–146)

## 2018-10-28 ENCOUNTER — Other Ambulatory Visit: Payer: Self-pay | Admitting: Family Medicine

## 2018-11-04 ENCOUNTER — Ambulatory Visit (INDEPENDENT_AMBULATORY_CARE_PROVIDER_SITE_OTHER): Payer: Medicare Other

## 2018-11-04 DIAGNOSIS — J309 Allergic rhinitis, unspecified: Secondary | ICD-10-CM

## 2018-11-25 ENCOUNTER — Ambulatory Visit (INDEPENDENT_AMBULATORY_CARE_PROVIDER_SITE_OTHER): Payer: Medicare Other | Admitting: Family Medicine

## 2018-11-25 ENCOUNTER — Other Ambulatory Visit: Payer: Self-pay

## 2018-11-25 DIAGNOSIS — J301 Allergic rhinitis due to pollen: Secondary | ICD-10-CM

## 2018-11-25 MED ORDER — PREDNISONE 20 MG PO TABS: ORAL_TABLET | ORAL | 0 refills | Status: DC

## 2018-11-25 MED ORDER — BENZONATATE 100 MG PO CAPS: 100.0000 mg | ORAL_CAPSULE | Freq: Three times a day (TID) | ORAL | 0 refills | Status: DC | PRN

## 2018-11-25 NOTE — Progress Notes (Signed)
Subjective:    Patient ID: ULA COUVILLON, female    DOB: [birth year], 73 y.o.   MRN: [id number]  HPI  Patient is being seen today via telephone visit.  She consents to be seen by telephone.  Phone call initiated at [phone].  Patient is at home.  I am in my office.  Patient has a history of severe allergies.  In fact she is on allergy shots.  She is also on Allegra and Flonase.  Her medicine list also includes Singulair however the patient does not believe she is taking the Singulair.  She states that when the weather changed a little more than a week ago and became warmer and the pollen started blowing off the pine trees, she started developing significant head congestion and rhinorrhea.  She is having extensive postnasal drip causing a tickle cough.  Cough is nonproductive.  She denies any purulent sputum.  She denies any chest pain.  She denies any shortness of breath.  She denies any fevers or chills.  She denies any sinus pain although she has significant head congestion.  She is tried Coricidin HBP over-the-counter along with Mucinex with little relief in her cough or sneezing or sinus congestion.  She denies any sick contacts or travel. Past Medical History:  Diagnosis Date  . 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 Anxiety   . CAD (coronary artery disease)    stent of distal RCA in 2006  . Diabetes mellitus without complication ([place])   . Diverticulosis   . GERD (gastroesophageal reflux disease)   . Gout   . Hiatal hernia   . History of nuclear stress test 03/2011   negative lexiscan myoview; normal perfusion  . Hyperlipidemia   . Hypertension   . Knee pain   . Obesity   . Positive TB test   . Venous insufficiency    Past Surgical History:  Procedure Laterality Date  . ABDOMINAL HYSTERECTOMY    . BACK SURGERY  2012  . BREAST LUMPECTOMY    . CORONARY ANGIOPLASTY WITH STENT PLACEMENT  06/29/2005   Taxus 2.5x64mm DES to distal RCA (Dr. Tami Ribas)  . TRANSTHORACIC ECHOCARDIOGRAM  12/20/2012   EF  45-80%, normal systolic function, mild hypokinesis of inf myocardium; calcified MV annulua, LA & RA mildly dilated   Current Outpatient Medications on File Prior to Visit  Medication Sig Dispense Refill  . ALPRAZolam (XANAX) 1 MG tablet TAKE 1 TABLET BY MOUTH TWICE A DAY 60 tablet 2  . aspirin 81 MG tablet Take 81 mg by mouth daily.      [name] atorvastatin (LIPITOR) 80 MG tablet TAKE 1 TABLET BY MOUTH EVERY DAY 90 tablet 3  . fexofenadine (ALLEGRA) 180 MG tablet Take 1 tablet (180 mg total) by mouth daily. 30 tablet 5  . fish oil-omega-3 fatty acids 1000 MG capsule Take 1 g by mouth. Takes occasionally    . fluticasone (FLONASE) 50 MCG/ACT nasal spray Place 1 spray into both nostrils 2 (two) times daily. 48 g 0  . levalbuterol (XOPENEX HFA) 45 MCG/ACT inhaler Inhale 2 puffs every 4 (four) hours as needed into the lungs for wheezing. 1 Inhaler 12  . losartan (COZAAR) 25 MG tablet TAKE 2 TABLETS (50MG ) BY MOUTH EVERY DAY 60 tablet 6  . meloxicam (MOBIC) 7.5 MG tablet TAKE 1 TABLET BY MOUTH EVERY DAY 30 tablet 0  . metoprolol tartrate (LOPRESSOR) 50 MG tablet TAKE 1 TABLET EVERY MORNING AND 1 AND 1/2 TABLET EVERY EVENING 225 tablet 1  .  Misc Natural Products (GLUCOSAMINE CHOND COMPLEX/MSM PO) Take by mouth. Occasionally    . montelukast (SINGULAIR) 10 MG tablet TAKE 1 TABLET BY MOUTH EVERYDAY AT BEDTIME 90 tablet 1  . Multiple Vitamin (MULTIVITAMIN) tablet Take 1 tablet by mouth. occasionally    . nitroGLYCERIN (NITROSTAT) 0.4 MG SL tablet PLACE 1 TABLET UNDER THE TONGUE EVERY 5 MINUTES AS NEEDED FOR CHEST PAIN. 75 tablet 1  . pantoprazole (PROTONIX) 40 MG tablet Take 1 tablet (40 mg total) by mouth daily. 90 tablet 0  . triamterene-hydrochlorothiazide (MAXZIDE-25) 37.5-25 MG tablet TAKE 1 TABLET BY MOUTH EVERY DAY 90 tablet 2  . zolpidem (AMBIEN) 10 MG tablet TAKE 1 TABLET BY MOUTH EVERY DAY AT BEDTIME AS NEEDED 30 tablet 5  . [DISCONTINUED] omeprazole (PRILOSEC OTC) 20 MG tablet Take 1 tablet (20 mg  total) by mouth daily. 30 tablet 1   No current facility-administered medications on file prior to visit.    Allergies  Allergen Reactions  . Ciprofloxacin   . Citalopram Hydrobromide   . Escitalopram Oxalate   . Paroxetine   . Polysporin [Bacitracin-Polymyxin B]    Social History   Socioeconomic History  . Marital status: Single    Spouse name: Not on file  . Number of children: 1  . Years of education: Not on file  . Highest education level: Not on file  Occupational History  . Occupation: Biomedical engineer  Social Needs  . Financial resource strain: Not on file  . Food insecurity:    Worry: Not on file    Inability: Not on file  . Transportation needs:    Medical: Not on file    Non-medical: Not on file  Tobacco Use  . Smoking status: Former Smoker    Last attempt to quit: 12/20/2001    Years since quitting: 16.9  . Smokeless tobacco: Never Used  Substance and Sexual Activity  . Alcohol use: No    Alcohol/week: 0.0 standard drinks  . Drug use: No  . Sexual activity: Not on file  Lifestyle  . Physical activity:    Days per week: Not on file    Minutes per session: Not on file  . Stress: Not on file  Relationships  . Social connections:    Talks on phone: Not on file    Gets together: Not on file    Attends religious service: Not on file    Active member of club or organization: Not on file    Attends meetings of clubs or organizations: Not on file    Relationship status: Not on file  . Intimate partner violence:    Fear of current or ex partner: Not on file    Emotionally abused: Not on file    Physically abused: Not on file    Forced sexual activity: Not on file  Other Topics Concern  . Not on file  Social History Narrative  . Not on file     Review of Systems  All other systems reviewed and are negative.      Objective:   Physical Exam No physical exam was performed as the patient was seen via telephone visit       Assessment & Plan:  Allergic  rhinitis due to pollen, unspecified seasonality  After an extensive discussion with the patient, I believe that the symptoms are primarily due to allergies.  I do not believe the patient is demonstrating signs or symptoms of coronavirus infection.  I have recommended continued quarantine at home to  avoid her acquiring the illness.  However I will treat her allergies with combination of a prednisone taper pack, continued use of Flonase and Allegra, and the addition of Tessalon Perles 100 mg every 8 hours as needed for cough.  Reassess in 1 week if no better or sooner if worse.  Phone call and office visit concluded at [phone].

## 2018-12-02 ENCOUNTER — Ambulatory Visit: Payer: Medicare Other | Admitting: Allergy and Immunology

## 2018-12-08 ENCOUNTER — Telehealth: Payer: Self-pay | Admitting: Family Medicine

## 2018-12-08 NOTE — Telephone Encounter (Signed)
Pt called on 4/11 she has been seen about 2 weeks ago with sinus drainage, allergies, she was given prednisone and using allergy meds, states she does get shots usually. She now has some cough, mostly clear production but not improving, no SOB, no fever, feels okay otherwise. Still has some sinus drainage as well Will treat with amoxicillin for 10 days Continue allergy meds Tessalon perrrles refilled  Will send message to PCP to f/u with patient on Monday  She has been quarantined within her home being high risk with her age  73 of red flags of when to go to ER if needed

## 2018-12-17 ENCOUNTER — Telehealth: Payer: Self-pay | Admitting: Family Medicine

## 2018-12-17 ENCOUNTER — Other Ambulatory Visit: Payer: Self-pay | Admitting: Family Medicine

## 2018-12-17 MED ORDER — FLUCONAZOLE 150 MG PO TABS: 150.0000 mg | ORAL_TABLET | Freq: Once | ORAL | 0 refills | Status: AC

## 2018-12-17 MED ORDER — FLUTICASONE PROPIONATE 50 MCG/ACT NA SUSP: 1.0000 | Freq: Two times a day (BID) | NASAL | 3 refills | Status: DC

## 2018-12-17 NOTE — Telephone Encounter (Signed)
Pt called and would like some Diflucan called out for her as she has a yeast infection d/t antibx. Ok to send?

## 2018-12-17 NOTE — Telephone Encounter (Signed)
I did

## 2018-12-18 ENCOUNTER — Other Ambulatory Visit: Payer: Self-pay | Admitting: *Deleted

## 2018-12-18 NOTE — Telephone Encounter (Signed)
Pt would also like a refill on her Xanax  LRF - 06/28/18

## 2018-12-19 MED ORDER — ALPRAZOLAM 1 MG PO TABS: 1.0000 mg | ORAL_TABLET | Freq: Two times a day (BID) | ORAL | 2 refills | Status: DC

## 2019-01-25 ENCOUNTER — Other Ambulatory Visit: Payer: Self-pay | Admitting: Internal Medicine

## 2019-02-04 ENCOUNTER — Other Ambulatory Visit: Payer: Self-pay | Admitting: Family Medicine

## 2019-02-05 NOTE — Telephone Encounter (Signed)
Last seen 11/25/2018 Last refilled: 11/13/2017

## 2019-02-18 LAB — BASIC METABOLIC PANEL: Creatinine: 0.8 (ref <=1.1)

## 2019-02-18 LAB — LIPID PANEL
HDL: 73 — AB (ref 35–70)
LDL Cholesterol: 87

## 2019-02-18 LAB — HEMOGLOBIN A1C: Hemoglobin A1C: 5.9

## 2019-03-26 ENCOUNTER — Other Ambulatory Visit: Payer: Self-pay | Admitting: Internal Medicine

## 2019-04-02 ENCOUNTER — Other Ambulatory Visit: Payer: Self-pay | Admitting: Internal Medicine

## 2019-04-04 ENCOUNTER — Other Ambulatory Visit: Payer: Self-pay | Admitting: Family Medicine

## 2019-04-08 ENCOUNTER — Encounter: Payer: Self-pay | Admitting: Sports Medicine

## 2019-04-08 ENCOUNTER — Other Ambulatory Visit: Payer: Self-pay

## 2019-04-08 ENCOUNTER — Ambulatory Visit (INDEPENDENT_AMBULATORY_CARE_PROVIDER_SITE_OTHER): Payer: Medicare Other | Admitting: Sports Medicine

## 2019-04-08 DIAGNOSIS — M79674 Pain in right toe(s): Secondary | ICD-10-CM | POA: Diagnosis not present

## 2019-04-08 DIAGNOSIS — B351 Tinea unguium: Secondary | ICD-10-CM

## 2019-04-08 DIAGNOSIS — M79675 Pain in left toe(s): Secondary | ICD-10-CM

## 2019-04-08 DIAGNOSIS — I739 Peripheral vascular disease, unspecified: Secondary | ICD-10-CM

## 2019-04-08 DIAGNOSIS — E119 Type 2 diabetes mellitus without complications: Secondary | ICD-10-CM

## 2019-04-08 DIAGNOSIS — Z9229 Personal history of other drug therapy: Secondary | ICD-10-CM

## 2019-04-08 NOTE — Progress Notes (Addendum)
Subjective: Madeline Wood is a 73 y.o. female patient seen today in office with complaint of mildly painful thickened and elongated toenails; unable to trim due to R shoulder dislocation like before unable to reach toes that well. Patient still on ASA treatment. Patient has no other pedal complaints at this time.   Last A1c 6.6   Patient Active Problem List   Diagnosis Date Noted  . Allergic rhinitis 09/13/2018  . Bilateral high frequency sensorineural hearing loss 01/17/2018  . Subjective tinnitus of left ear 01/17/2018  . Atherosclerosis of native coronary artery of native heart without angina pectoris 07/23/2017  . History of food allergy 01/05/2016  . Angio-edema 11/17/2015  . Allergic rhinoconjunctivitis, seasonal and perennial 05/04/2015  . Insomnia 08/24/2014  . Bilateral leg edema 04/17/2014  . Diabetes mellitus without complication ([place])   . Essential hypertension   . Gout   . Mixed hyperlipidemia   . 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 Anxiety   . Obesity   . Knee pain   . Positive TB test   . Hiatal hernia   . GERD (gastroesophageal reflux disease)   . Coronary atherosclerosis 06/06/2010  . GERD 06/06/2010  . History of colonic polyps 06/06/2010    Current Outpatient Medications on File Prior to Visit  Medication Sig Dispense Refill  . ALPRAZolam (XANAX) 1 MG tablet Take 1 tablet (1 mg total) by mouth 2 (two) times daily. 60 tablet 2  . aspirin 81 MG tablet Take 81 mg by mouth daily.      [name] atorvastatin (LIPITOR) 80 MG tablet TAKE 1 TABLET BY MOUTH EVERY DAY 90 tablet 3  . benzonatate (TESSALON) 100 MG capsule Take 1 capsule (100 mg total) by mouth 3 (three) times daily as needed for cough. 30 capsule 0  . fexofenadine (ALLEGRA) 180 MG tablet Take 1 tablet (180 mg total) by mouth daily. 30 tablet 5  . fish oil-omega-3 fatty acids 1000 MG capsule Take 1 g by mouth. Takes occasionally    . fluticasone (FLONASE) 50 MCG/ACT nasal spray Place 1 spray into both nostrils 2 (two)  times daily. 48 g 3  . levalbuterol (XOPENEX HFA) 45 MCG/ACT inhaler Inhale 2 puffs every 4 (four) hours as needed into the lungs for wheezing. 1 Inhaler 12  . losartan (COZAAR) 25 MG tablet TAKE 2 TABLETS (50MG ) BY MOUTH EVERY DAY 180 tablet 1  . meloxicam (MOBIC) 7.5 MG tablet TAKE 1 TABLET BY MOUTH EVERY DAY 30 tablet 0  . metoprolol tartrate (LOPRESSOR) 50 MG tablet TAKE 1 TABLET EVERY MORNING AND 1 AND 1/2 TABLET EVERY EVENING 225 tablet 0  . Misc Natural Products (GLUCOSAMINE CHOND COMPLEX/MSM PO) Take by mouth. Occasionally    . montelukast (SINGULAIR) 10 MG tablet TAKE 1 TABLET BY MOUTH EVERYDAY AT BEDTIME 90 tablet 1  . Multiple Vitamin (MULTIVITAMIN) tablet Take 1 tablet by mouth. occasionally    . nitroGLYCERIN (NITROSTAT) 0.4 MG SL tablet PLACE 1 TABLET UNDER THE TONGUE EVERY 5 MINUTES AS NEEDED FOR CHEST PAIN. 75 tablet 1  . pantoprazole (PROTONIX) 40 MG tablet TAKE 1 TABLET BY MOUTH EVERY DAY 90 tablet 0  . predniSONE (DELTASONE) 20 MG tablet 3 tabs poqday 1-2, 2 tabs poqday 3-4, 1 tab poqday 5-6 12 tablet 0  . triamterene-hydrochlorothiazide (MAXZIDE-25) 37.5-25 MG tablet TAKE 1 TABLET BY MOUTH EVERY DAY 90 tablet 2  . zolpidem (AMBIEN) 10 MG tablet TAKE 1 TABLET BY MOUTH EVERY DAY AT BEDTIME AS NEEDED 30 tablet 0  . [DISCONTINUED]  omeprazole (PRILOSEC OTC) 20 MG tablet Take 1 tablet (20 mg total) by mouth daily. 30 tablet 1   No current facility-administered medications on file prior to visit.     Allergies  Allergen Reactions  . Ciprofloxacin   . Citalopram Hydrobromide   . Escitalopram Oxalate   . Paroxetine   . Polysporin [Bacitracin-Polymyxin B]     Objective: Physical Exam  General: Well developed, nourished, no acute distress, awake, alert and oriented x 3  Vascular: Dorsalis pedis artery 1/4 bilateral, Posterior tibial artery 1/4 bilateral, skin temperature warm to warm proximal to distal bilateral lower extremities, no varicosities, pedal hair present  bilateral.  Neurological: Gross sensation present via light touch bilateral.   Dermatological: Skin is warm, dry, and supple bilateral, Nails 1-10 are tender, long, thick, and discolored with mild subungal debris, no webspace macerations present bilateral, no open lesions present bilateral, no callus/corns/hyperkeratotic tissue present bilateral. No signs of infection bilateral.  Musculoskeletal: Asymptomatic pes planus and bunion boney deformities noted bilateral. Muscular strength within normal limits without painon range of motion. No pain with calf compression bilateral.  Assessment and Plan:  Problem List Items Addressed This Visit      Endocrine   Diabetes mellitus without complication ([place])    Other Visit Diagnoses    Pain due to onychomycosis of toenails of both feet    -  Primary   PVD (peripheral vascular disease) ([place])       HX: long term anticoagulant use         -Examined patient.  -Discussed treatment options for painful mycotic nails. -Mechanically debrided and reduced mycotic nails with sterile nail nipper and dremel nail file without incident. -Continue with good hygiene habits and topical cream and nail solution  -Patient to return in 3 months for follow up evaluation or sooner if symptoms worsen.  [name], [name]

## 2019-04-15 ENCOUNTER — Telehealth: Payer: Self-pay | Admitting: *Deleted

## 2019-04-15 DIAGNOSIS — J3089 Other allergic rhinitis: Secondary | ICD-10-CM | POA: Diagnosis not present

## 2019-04-15 NOTE — Telephone Encounter (Signed)
Patient last received allergy injections 11/04/2018 at .50 Every 4 Weeks and is now wanting to start back with her allergy injections. Patient's vials have expired and will need new vials made. Please advise where you would like for the patient to start back with her injections. Thank You.

## 2019-04-15 NOTE — Telephone Encounter (Signed)
Start back at [facility] 0.05 and build up on schedule B. Thanks.

## 2019-04-16 DIAGNOSIS — J301 Allergic rhinitis due to pollen: Secondary | ICD-10-CM | POA: Diagnosis not present

## 2019-04-16 NOTE — Progress Notes (Signed)
VIALS EXP 04-13-20

## 2019-04-17 NOTE — Telephone Encounter (Signed)
New vials have been ordered. Called and spoke with patient and advised. Patient is scheduled for 04/29/2019 to come in and re-start allergy injections.

## 2019-04-29 ENCOUNTER — Ambulatory Visit (INDEPENDENT_AMBULATORY_CARE_PROVIDER_SITE_OTHER): Payer: Medicare Other | Admitting: *Deleted

## 2019-04-29 DIAGNOSIS — J309 Allergic rhinitis, unspecified: Secondary | ICD-10-CM | POA: Diagnosis not present

## 2019-04-29 NOTE — Progress Notes (Signed)
Immunotherapy   Patient Details  Name: YANINE EIFERT MRN: [id number] Date of Birth: 05-05-1946  04/29/2019  [name] is restarted injections for  Grass-W-T-Mite & Mold-Cat-Cockroach.  Following schedule: B  Frequency:1 time per week Epi-Pen:Epi-Pen Available  Consent signed and patient instructions given. No problems after 30 minutes in the office.   Horris Latino 04/29/2019, 4:56 PM

## 2019-05-06 ENCOUNTER — Ambulatory Visit (INDEPENDENT_AMBULATORY_CARE_PROVIDER_SITE_OTHER): Payer: Medicare Other | Admitting: *Deleted

## 2019-05-06 DIAGNOSIS — J309 Allergic rhinitis, unspecified: Secondary | ICD-10-CM | POA: Diagnosis not present

## 2019-05-06 MED ORDER — EPINEPHRINE 0.3 MG/0.3ML IJ SOAJ: 0.3000 mg | Freq: Once | INTRAMUSCULAR | 1 refills | Status: AC

## 2019-05-08 ENCOUNTER — Other Ambulatory Visit: Payer: Self-pay | Admitting: Internal Medicine

## 2019-05-08 ENCOUNTER — Other Ambulatory Visit: Payer: Self-pay | Admitting: Family Medicine

## 2019-05-10 IMAGING — CT CT HEAD W/O CM
3 of 7 series · 15 of 47 positions shown, 18 images · non-contrast
Comparison: 12/28/2006

CLINICAL DATA: Tripped and fell backwards, hit head pain at the
shoulder

EXAM:
CT HEAD WITHOUT CONTRAST
CT CERVICAL SPINE WITHOUT CONTRAST
TECHNIQUE: Multidetector CT imaging of the head and cervical spine was
performed following the standard protocol without intravenous
contrast. Multiplanar CT image reconstructions of the cervical spine
were also generated.

[Series 5: coronal · coronal · 0.28mm/px · 3 of 74 slices shown]
[im 25/74  brain]
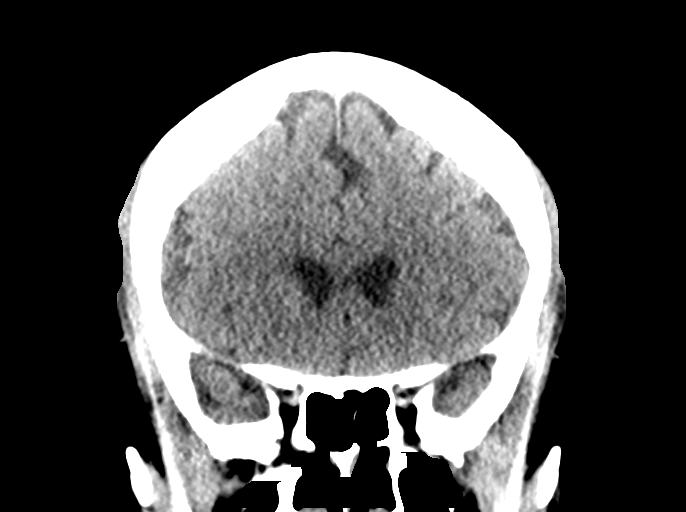
[im 33/74  brain]
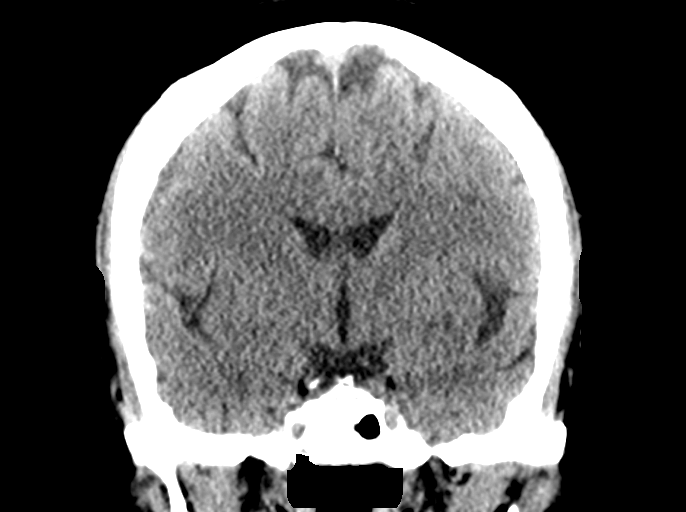
[im 41/74  brain]
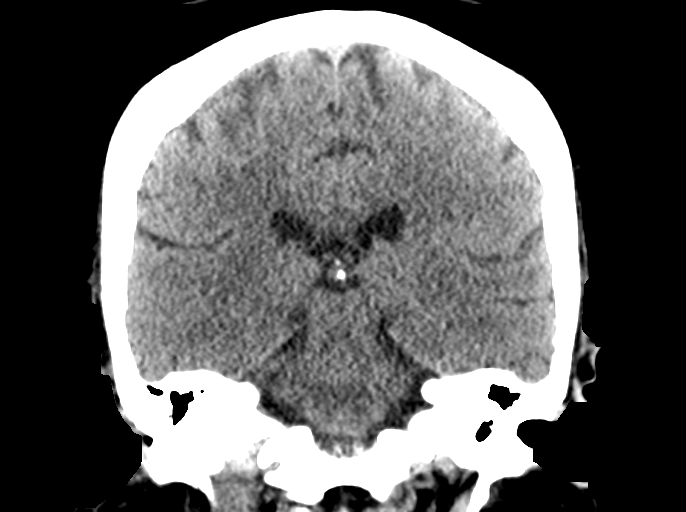

[Series 10: axial recon · axial · 0.23mm/px · z∈[-303,-159]mm · 10 of 107 slices shown, 13 images]
[im 9/107  brain]
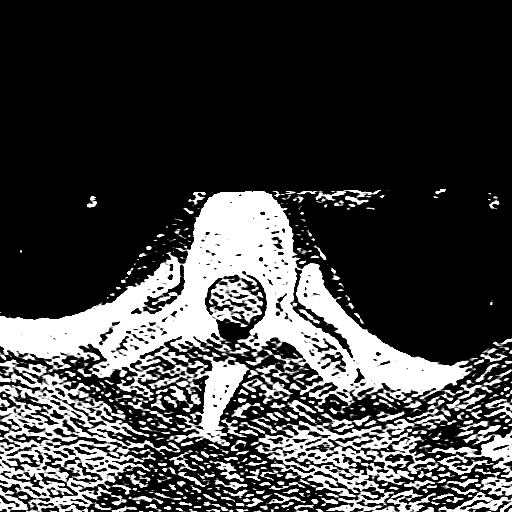
[im 9/107  bone]
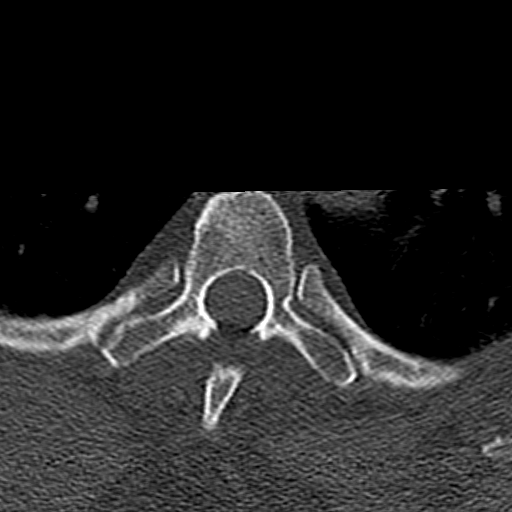
[im 18/107  brain]
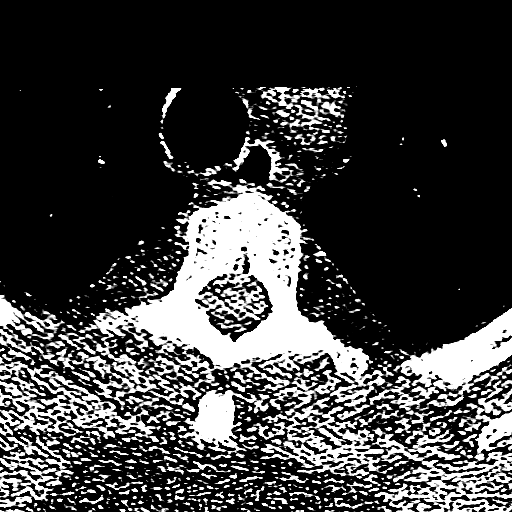
[im 27/107  brain]
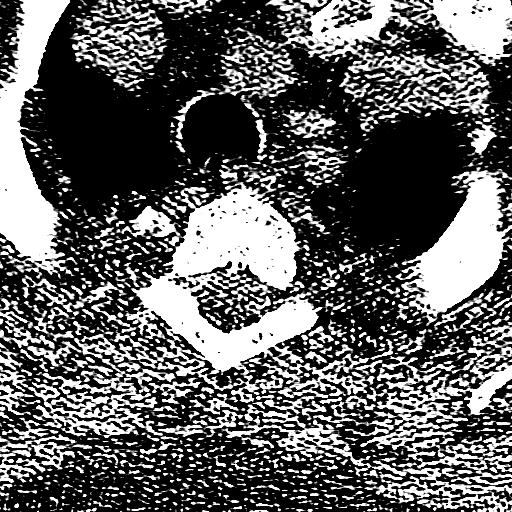
[im 36/107  brain]
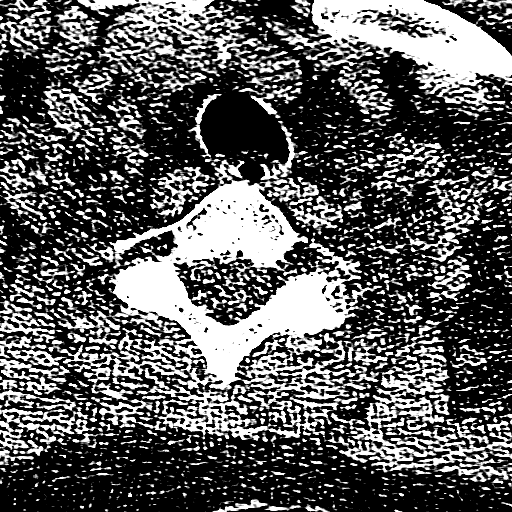
[im 45/107  brain]
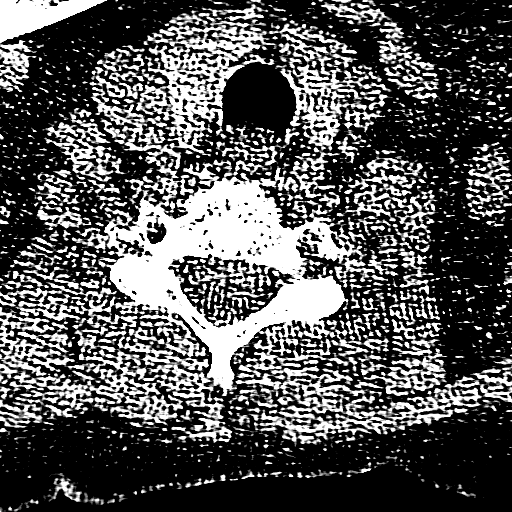
[im 45/107  bone]
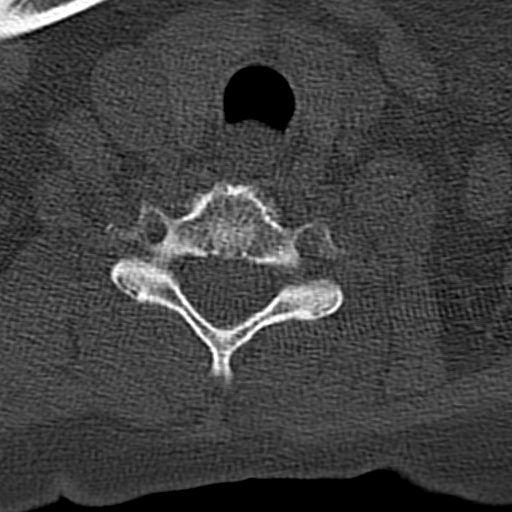
[im 62/107  brain]
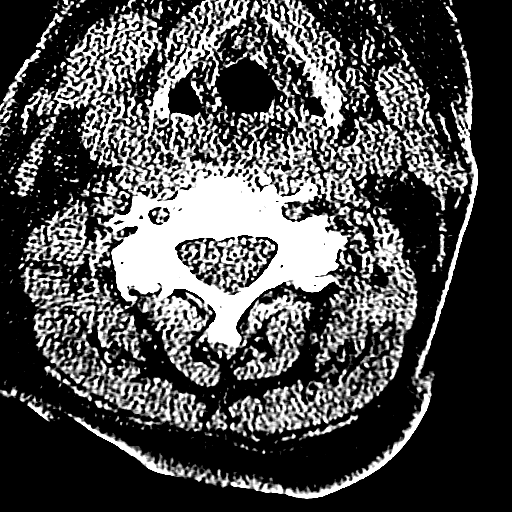
[im 71/107  brain]
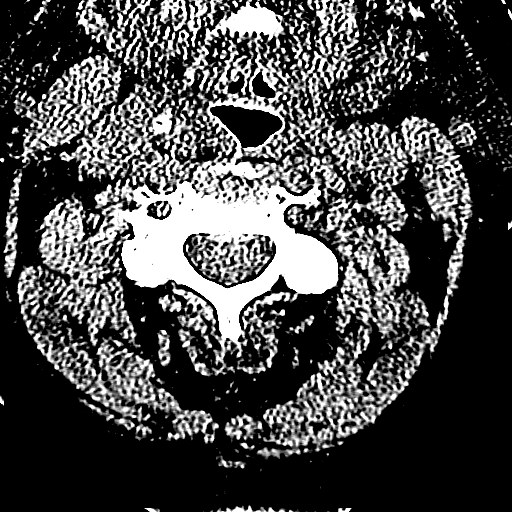
[im 80/107  brain]
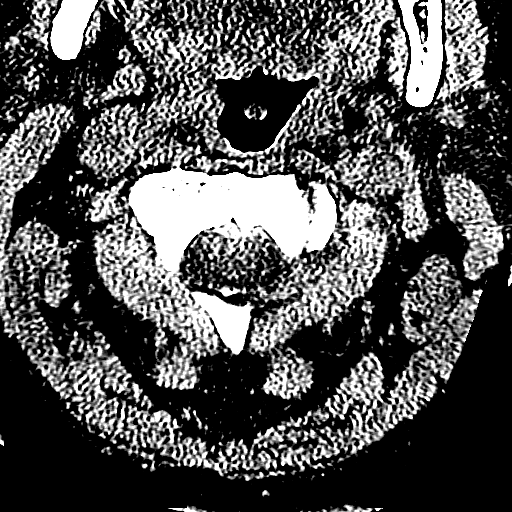
[im 89/107  brain]
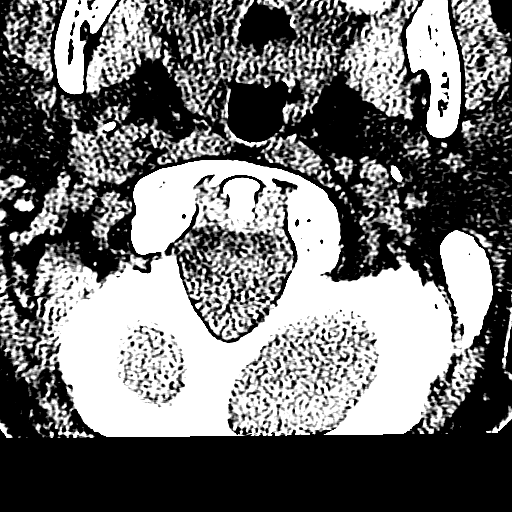
[im 89/107  bone]
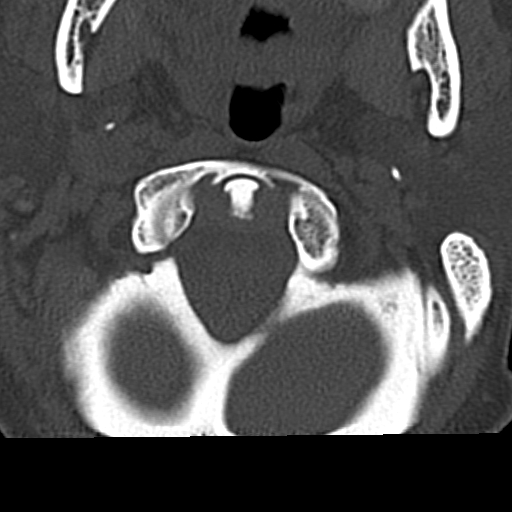
[im 98/107  brain]
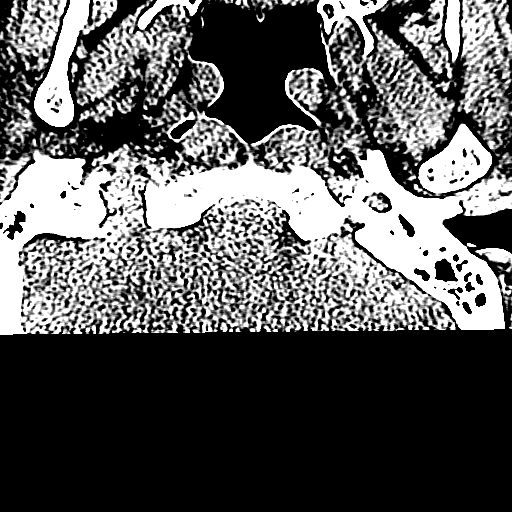

[Series 12: sagittal · sagittal · 0.35mm/px · 2 of 61 slices shown]
[im 21/61  brain]
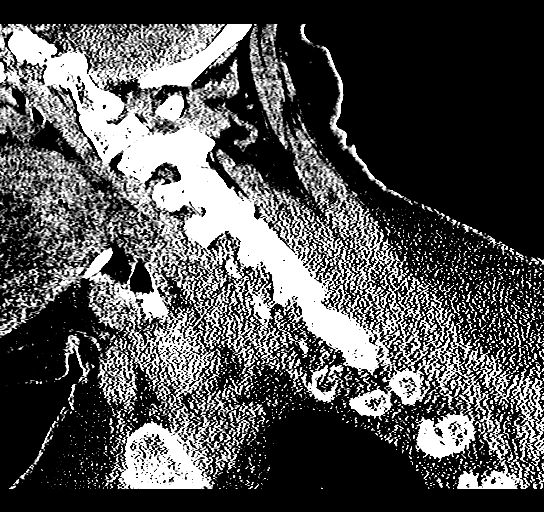
[im 41/61  brain]
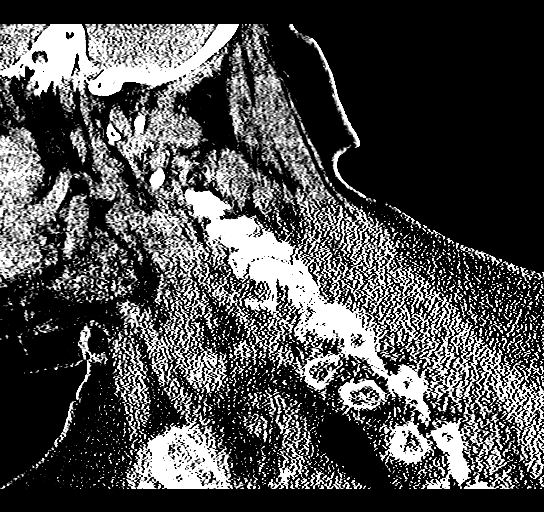

[15 of 47 positions shown; findings below may reference images not displayed]

FINDINGS: CT HEAD FINDINGS

Brain: No evidence of acute infarction, hemorrhage, hydrocephalus,
extra-axial collection or mass lesion/mass effect.

Vascular: No hyperdense vessels. Scattered calcifications at the
carotid siphons.

Skull: Normal. Negative for fracture or focal lesion.

Sinuses/Orbits: No acute finding.

Other: None

CT CERVICAL SPINE FINDINGS

Alignment: Trace anterolisthesis of C4 on C5. Straightening of the
cervical spine. Facet alignment is maintained

Skull base and vertebrae: No acute fracture. No primary bone lesion
or focal pathologic process.

Soft tissues and spinal canal: No prevertebral fluid or swelling. No
visible canal hematoma.

Disc levels:  Mild degenerative changes at C4-C5 and C5-C6.

Upper chest: Apical emphysema. 11 mm hypodense nodule in left lobe
of thyroid.

Other: None
IMPRESSION: 1. No CT evidence for acute intracranial abnormality.
2. Trace anterolisthesis of C4 on C5.  No fracture is seen
3. Apical emphysema
4. 11 mm left lobe of thyroid hypodense nodule, correlation with
nonemergent thyroid ultrasound if indicated.

## 2019-05-10 IMAGING — DX DG SHOULDER 2+V*R*
2 series · 2 of 2 positions shown · non-contrast
Comparison: Pre reduction radiographs earlier this day.

CLINICAL DATA: Dislocation, postreduction.

EXAM:
RIGHT SHOULDER - 2+ VIEW

[shoulder axial]
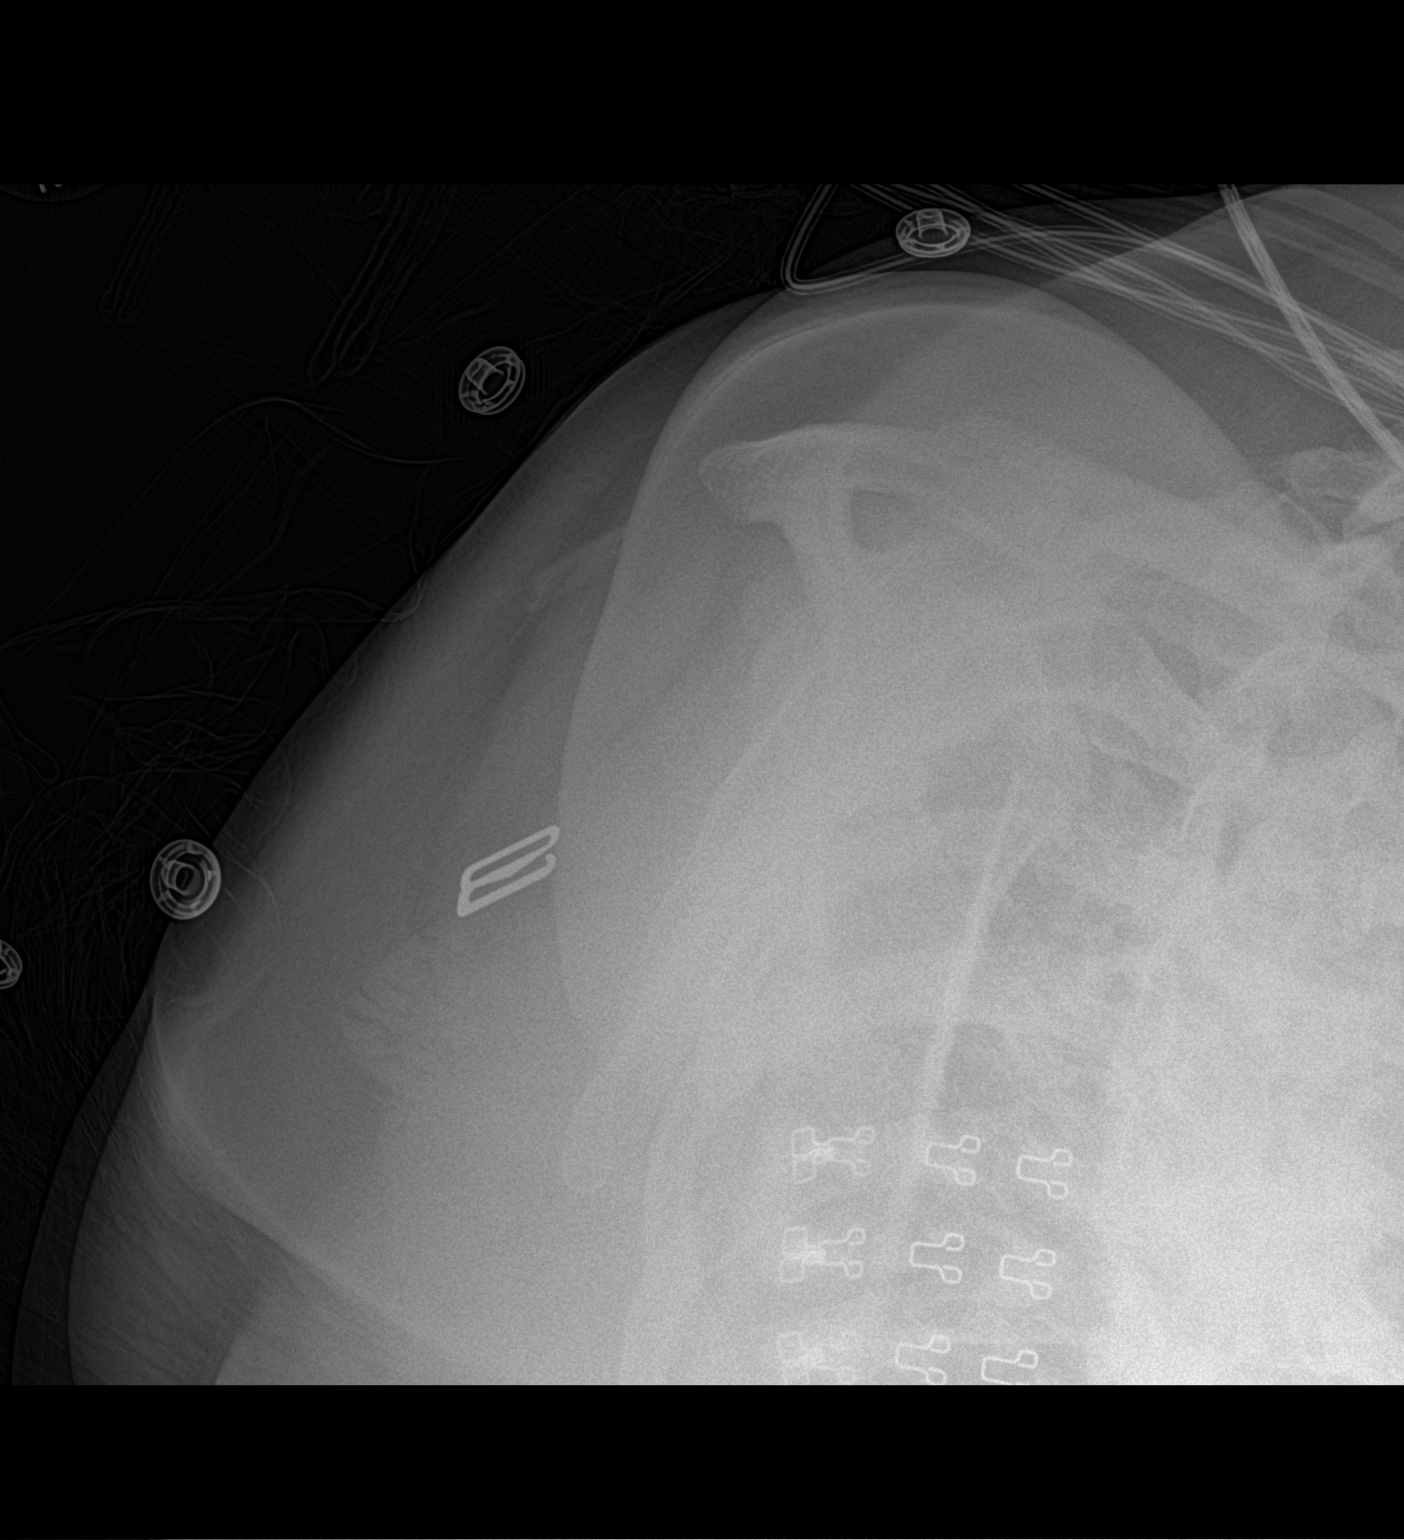

[shoulder ap]
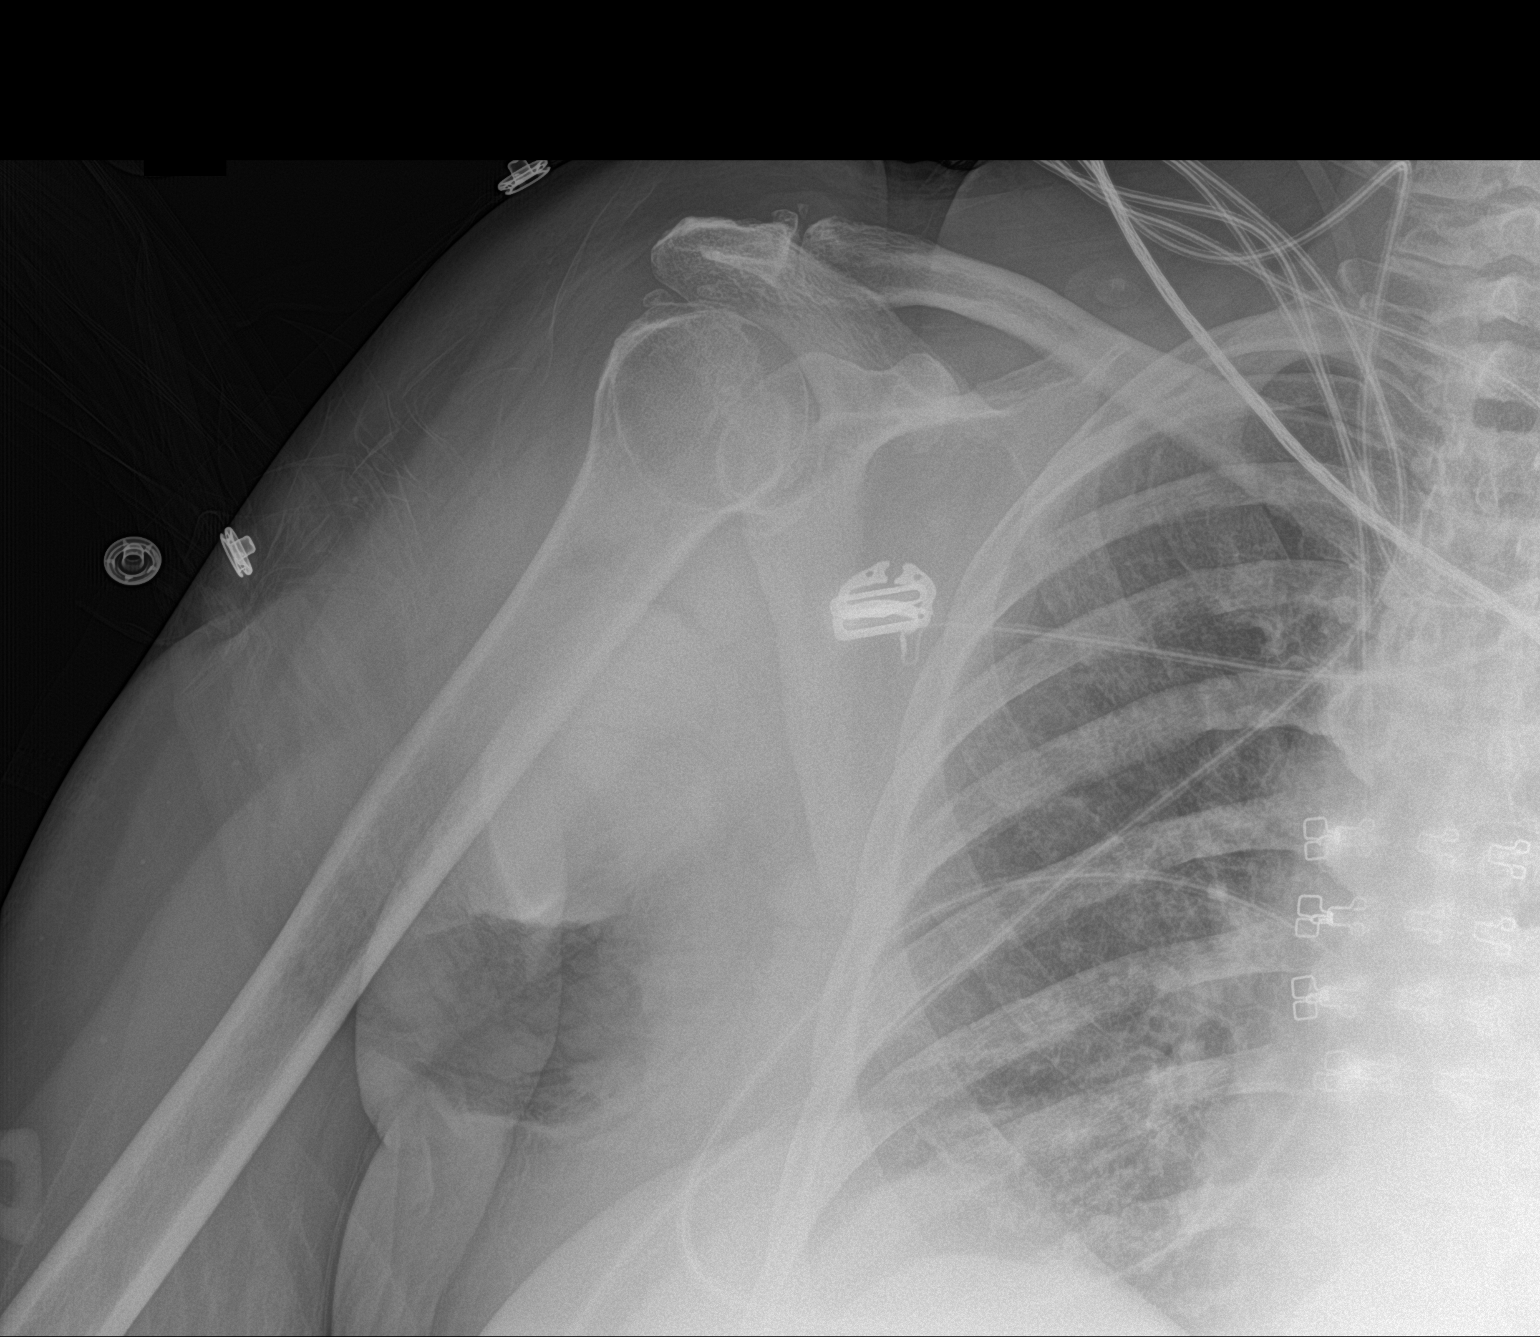

[2 of 2 positions shown; findings below may reference images not displayed]

FINDINGS: Prior anterior shoulder dislocation has been reduced, alignment is
now anatomic. Inferior glenoid irregularity is again suspected, not
as well visualized on the current exam. The acromioclavicular joint
is congruent.
IMPRESSION: Reduction of prior shoulder dislocation. Possible bony Bankart from
the inferior glenoid.

## 2019-05-14 ENCOUNTER — Ambulatory Visit (INDEPENDENT_AMBULATORY_CARE_PROVIDER_SITE_OTHER): Payer: Medicare Other | Admitting: *Deleted

## 2019-05-14 DIAGNOSIS — J309 Allergic rhinitis, unspecified: Secondary | ICD-10-CM | POA: Diagnosis not present

## 2019-05-18 DIAGNOSIS — H2513 Age-related nuclear cataract, bilateral: Secondary | ICD-10-CM | POA: Diagnosis not present

## 2019-05-19 ENCOUNTER — Other Ambulatory Visit: Payer: Self-pay

## 2019-05-20 ENCOUNTER — Encounter: Payer: Self-pay | Admitting: Family Medicine

## 2019-05-20 ENCOUNTER — Other Ambulatory Visit: Payer: Self-pay | Admitting: Family Medicine

## 2019-05-20 ENCOUNTER — Ambulatory Visit (INDEPENDENT_AMBULATORY_CARE_PROVIDER_SITE_OTHER): Payer: Medicare Other | Admitting: Family Medicine

## 2019-05-20 ENCOUNTER — Other Ambulatory Visit: Payer: Self-pay | Admitting: Internal Medicine

## 2019-05-20 VITALS — BP 138/70 | HR 70 | Temp 98.5°F | Resp 14 | Ht 66.0 in | Wt 215.0 lb

## 2019-05-20 DIAGNOSIS — E782 Mixed hyperlipidemia: Secondary | ICD-10-CM

## 2019-05-20 DIAGNOSIS — E119 Type 2 diabetes mellitus without complications: Secondary | ICD-10-CM | POA: Diagnosis not present

## 2019-05-20 DIAGNOSIS — I10 Essential (primary) hypertension: Secondary | ICD-10-CM

## 2019-05-20 DIAGNOSIS — I2510 Atherosclerotic heart disease of native coronary artery without angina pectoris: Secondary | ICD-10-CM | POA: Diagnosis not present

## 2019-05-20 DIAGNOSIS — Z23 Encounter for immunization: Secondary | ICD-10-CM | POA: Diagnosis not present

## 2019-05-20 DIAGNOSIS — Z1159 Encounter for screening for other viral diseases: Secondary | ICD-10-CM | POA: Diagnosis not present

## 2019-05-20 NOTE — Addendum Note (Signed)
Addended by: Shary Decamp B on: 05/20/2019 09:32 AM   Modules accepted: Orders

## 2019-05-20 NOTE — Progress Notes (Signed)
Subjective:    Patient ID: Madeline Wood, female    DOB: November 18, 1945, 73 y.o.   MRN: [id number]  HPI   Patient is a very pleasant 73 year old African-American female who is here today for a checkup.  Immunizations are up-to-date.  Mammogram is up to date.  She is due for her next Cologuard in 2021.  Past medical history significant for coronary artery disease with a stent put in her distal RCA in 2006.  She is currently on Lipitor 80 mg a day as well as aspirin.  She denies any angina or chest pain or shortness of breath.  Her blood pressure today is well controlled at 120/70.  She is on a beta-blocker.  She also has a history of diet-controlled diabetes mellitus.  She denies any polyuria, polydipsia, or blurry vision.  She denies any neuropathy in her feet.  She denies any myalgias or right upper quadrant pain on Lipitor 80 mg poqday.  She is due for her flu shot.   Reviewing her records, she is due for a hepatitis screening test.  She did have a blood transfusion in 1979 which put would put her at increased risk.  It states that she has not had a diabetic eye exam however she saw her eye doctor last week and had a normal eye exam.  We have no records of this but I have asked to try to get her records to update her chart.  She is also due for a flu shot.  She is due for a foot exam which is performed today and is completely normal.  Past Medical History:  Diagnosis Date  . 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 Anxiety   . CAD (coronary artery disease)    stent of distal RCA in 2006  . Diabetes mellitus without complication ([place])   . Diverticulosis   . GERD (gastroesophageal reflux disease)   . Gout   . Hiatal hernia   . History of nuclear stress test 03/2011   negative lexiscan myoview; normal perfusion  . Hyperlipidemia   . Hypertension   . Knee pain   . Obesity   . Positive TB test   . Venous insufficiency    Past Surgical History:  Procedure Laterality Date  . ABDOMINAL HYSTERECTOMY    .  BACK SURGERY  2012  . BREAST LUMPECTOMY    . CORONARY ANGIOPLASTY WITH STENT PLACEMENT  06/29/2005   [place]us 2.5x69mm DES to distal RCA (Dr. Tami Ribas)  . TRANSTHORACIC ECHOCARDIOGRAM  12/20/2012   EF 99991111, normal systolic function, mild hypokinesis of inf myocardium; calcified MV annulua, LA & RA mildly dilated   Current Outpatient Medications on File Prior to Visit  Medication Sig Dispense Refill  . ALPRAZolam (XANAX) 1 MG tablet Take 1 tablet (1 mg total) by mouth 2 (two) times daily. 60 tablet 2  . aspirin 81 MG tablet Take 81 mg by mouth daily.      [name] atorvastatin (LIPITOR) 80 MG tablet TAKE 1 TABLET BY MOUTH EVERY DAY 90 tablet 3  . benzonatate (TESSALON) 100 MG capsule Take 1 capsule (100 mg total) by mouth 3 (three) times daily as needed for cough. 30 capsule 0  . fexofenadine (ALLEGRA) 180 MG tablet Take 1 tablet (180 mg total) by mouth daily. 30 tablet 5  . fish oil-omega-3 fatty acids 1000 MG capsule Take 1 g by mouth. Takes occasionally    . fluticasone (FLONASE) 50 MCG/ACT nasal spray Place 1 spray into both nostrils 2 (two) times  daily. 48 g 3  . levalbuterol (XOPENEX HFA) 45 MCG/ACT inhaler Inhale 2 puffs every 4 (four) hours as needed into the lungs for wheezing. 1 Inhaler 12  . losartan (COZAAR) 25 MG tablet TAKE 2 TABLETS (50MG ) BY MOUTH EVERY DAY 180 tablet 0  . meloxicam (MOBIC) 7.5 MG tablet TAKE 1 TABLET BY MOUTH EVERY DAY 30 tablet 0  . metoprolol tartrate (LOPRESSOR) 50 MG tablet TAKE 1 TABLET EVERY MORNING AND 1 AND 1/2 TABLET EVERY EVENING 225 tablet 0  . Misc Natural Products (GLUCOSAMINE CHOND COMPLEX/MSM PO) Take by mouth. Occasionally    . montelukast (SINGULAIR) 10 MG tablet TAKE 1 TABLET BY MOUTH EVERYDAY AT BEDTIME 90 tablet 1  . Multiple Vitamin (MULTIVITAMIN) tablet Take 1 tablet by mouth. occasionally    . nitroGLYCERIN (NITROSTAT) 0.4 MG SL tablet PLACE 1 TABLET UNDER THE TONGUE EVERY 5 MINUTES AS NEEDED FOR CHEST PAIN. 75 tablet 1  . pantoprazole (PROTONIX)  40 MG tablet TAKE 1 TABLET BY MOUTH EVERY DAY 90 tablet 2  . predniSONE (DELTASONE) 20 MG tablet 3 tabs poqday 1-2, 2 tabs poqday 3-4, 1 tab poqday 5-6 12 tablet 0  . triamterene-hydrochlorothiazide (MAXZIDE-25) 37.5-25 MG tablet TAKE 1 TABLET BY MOUTH EVERY DAY 90 tablet 2  . zolpidem (AMBIEN) 10 MG tablet TAKE 1 TABLET BY MOUTH EVERY DAY AT BEDTIME AS NEEDED 30 tablet 0  . [DISCONTINUED] omeprazole (PRILOSEC OTC) 20 MG tablet Take 1 tablet (20 mg total) by mouth daily. 30 tablet 1   No current facility-administered medications on file prior to visit.    Allergies  Allergen Reactions  . Ciprofloxacin   . Citalopram Hydrobromide   . Escitalopram Oxalate   . Paroxetine   . Polysporin [Bacitracin-Polymyxin B]    Social History   Socioeconomic History  . Marital status: Single    Spouse name: Not on file  . Number of children: 1  . Years of education: Not on file  . Highest education level: Not on file  Occupational History  . Occupation: Biomedical engineer  Social Needs  . Financial resource strain: Not on file  . Food insecurity    Worry: Not on file    Inability: Not on file  . Transportation needs    Medical: Not on file    Non-medical: Not on file  Tobacco Use  . Smoking status: Former Smoker    Quit date: 12/20/2001    Years since quitting: 17.4  . Smokeless tobacco: Never Used  Substance and Sexual Activity  . Alcohol use: No    Alcohol/week: 0.0 standard drinks  . Drug use: No  . Sexual activity: Not on file  Lifestyle  . Physical activity    Days per week: Not on file    Minutes per session: Not on file  . Stress: Not on file  Relationships  . Social Herbalist on phone: Not on file    Gets together: Not on file    Attends religious service: Not on file    Active member of club or organization: Not on file    Attends meetings of clubs or organizations: Not on file    Relationship status: Not on file  . Intimate partner violence    Fear of current or ex  partner: Not on file    Emotionally abused: Not on file    Physically abused: Not on file    Forced sexual activity: Not on file  Other Topics Concern  . Not on  file  Social History Narrative  . Not on file     Review of Systems  All other systems reviewed and are negative.      Objective:   Physical Exam Constitutional:      General: She is not in acute distress.    Appearance: Normal appearance. She is not ill-appearing, toxic-appearing or diaphoretic.  Cardiovascular:     Rate and Rhythm: Normal rate and regular rhythm.     Pulses: Normal pulses.     Heart sounds: Normal heart sounds. No murmur. No friction rub. No gallop.   Pulmonary:     Effort: Pulmonary effort is normal. No respiratory distress.     Breath sounds: Normal breath sounds. No stridor. No wheezing, rhonchi or rales.  Abdominal:     General: Bowel sounds are normal. There is no distension.     Palpations: Abdomen is soft.     Tenderness: There is no abdominal tenderness. There is no rebound.  Musculoskeletal:     Right lower leg: No edema.     Left lower leg: No edema.  Neurological:     Mental Status: She is alert.           Assessment & Plan:  .Atherosclerosis of native coronary artery of native heart without angina pectoris - Plan: CANCELED: CBC with Differential/Platelet, CANCELED: COMPLETE METABOLIC PANEL WITH GFR, CANCELED: Lipid panel  Diabetes mellitus without complication ([place]) - Plan: CANCELED: Hemoglobin A1c, CANCELED: CBC with Differential/Platelet, CANCELED: COMPLETE METABOLIC PANEL WITH GFR, CANCELED: Lipid panel, CANCELED: Microalbumin, urine  Essential hypertension - Plan: CANCELED: CBC with Differential/Platelet, CANCELED: COMPLETE METABOLIC PANEL WITH GFR, CANCELED: Lipid panel  Mixed hyperlipidemia - Plan: CANCELED: CBC with Differential/Platelet, CANCELED: COMPLETE METABOLIC PANEL WITH GFR, CANCELED: Lipid panel  Encounter for hepatitis C screening test for low risk patient -  Plan: Hepatitis C Antibody  Recently had labs pertaining to her insurance.  Regarding her diabetes, her hemoglobin A1c was 5.9 and outstanding.  Her creatinine was 0.80 and her GFR was calculated to be 86 mL of blood per minute which is outstanding.  Her alkaline phosphatase, AST, and.  Her total cholesterol was 174.  Her HDL cholesterol was 73.  Her LDL cholesterol slightly high at 87 given the high HDL I believe this is acceptable.  She also had a CEA that was normal as well as a pro BNP that was normal and a urinalysis that was normal with a urine protein to creatinine ratio of 34.  Lab work was outstanding.  I will perform hepatitis C screening and she did receive her flu shot.  Her blood pressure is well controlled today.  The remainder of her preventative care is up-to-date.

## 2019-05-21 LAB — HEPATITIS C ANTIBODY
Hepatitis C Ab: NONREACTIVE
SIGNAL TO CUT-OFF: 0.01 (ref <1.00)

## 2019-05-22 ENCOUNTER — Ambulatory Visit (INDEPENDENT_AMBULATORY_CARE_PROVIDER_SITE_OTHER): Payer: Medicare Other

## 2019-05-22 ENCOUNTER — Encounter: Payer: Self-pay | Admitting: Family Medicine

## 2019-05-22 DIAGNOSIS — J309 Allergic rhinitis, unspecified: Secondary | ICD-10-CM | POA: Diagnosis not present

## 2019-05-23 ENCOUNTER — Other Ambulatory Visit: Payer: Self-pay | Admitting: *Deleted

## 2019-05-23 MED ORDER — ZOLPIDEM TARTRATE 10 MG PO TABS: ORAL_TABLET | ORAL | 0 refills | Status: DC

## 2019-05-23 MED ORDER — ALPRAZOLAM 1 MG PO TABS: 1.0000 mg | ORAL_TABLET | Freq: Two times a day (BID) | ORAL | 2 refills | Status: DC

## 2019-05-23 NOTE — Telephone Encounter (Signed)
Received fax requesting refill on Ambien and Xanax.   Ok to refill??  Last office visit 05/20/2019.  Last refill on Ambien 02/06/2019.  Last refill on Xanax 12/19/2018.

## 2019-05-29 ENCOUNTER — Ambulatory Visit (INDEPENDENT_AMBULATORY_CARE_PROVIDER_SITE_OTHER): Payer: Medicare Other | Admitting: *Deleted

## 2019-05-29 DIAGNOSIS — J309 Allergic rhinitis, unspecified: Secondary | ICD-10-CM

## 2019-06-05 ENCOUNTER — Ambulatory Visit (INDEPENDENT_AMBULATORY_CARE_PROVIDER_SITE_OTHER): Payer: Medicare Other | Admitting: *Deleted

## 2019-06-05 DIAGNOSIS — J309 Allergic rhinitis, unspecified: Secondary | ICD-10-CM | POA: Diagnosis not present

## 2019-06-10 ENCOUNTER — Other Ambulatory Visit: Payer: Self-pay | Admitting: Internal Medicine

## 2019-06-10 ENCOUNTER — Telehealth: Payer: Self-pay | Admitting: Internal Medicine

## 2019-06-10 MED ORDER — LOSARTAN POTASSIUM 25 MG PO TABS: ORAL_TABLET | ORAL | 0 refills | Status: DC

## 2019-06-10 NOTE — Telephone Encounter (Signed)
Spoke to patient she stated she lost her bottle of Losartan.Stated she just had refilled and she thinks she threw it away by accident.Spoke to pharmacist at [address]she will call her insurance to get a over ride.She will call patient when prescription is ready.

## 2019-06-10 NOTE — Telephone Encounter (Signed)
New message:     Patient calling stating she has missed placed some medications and would like for some one to call her.

## 2019-06-16 ENCOUNTER — Ambulatory Visit (INDEPENDENT_AMBULATORY_CARE_PROVIDER_SITE_OTHER): Payer: Medicare Other

## 2019-06-16 DIAGNOSIS — J309 Allergic rhinitis, unspecified: Secondary | ICD-10-CM | POA: Diagnosis not present

## 2019-06-25 ENCOUNTER — Ambulatory Visit (INDEPENDENT_AMBULATORY_CARE_PROVIDER_SITE_OTHER): Payer: Medicare Other

## 2019-06-25 DIAGNOSIS — J309 Allergic rhinitis, unspecified: Secondary | ICD-10-CM | POA: Diagnosis not present

## 2019-07-08 ENCOUNTER — Other Ambulatory Visit: Payer: Self-pay

## 2019-07-08 ENCOUNTER — Encounter: Payer: Self-pay | Admitting: Sports Medicine

## 2019-07-08 ENCOUNTER — Ambulatory Visit (INDEPENDENT_AMBULATORY_CARE_PROVIDER_SITE_OTHER): Payer: Medicare Other | Admitting: *Deleted

## 2019-07-08 ENCOUNTER — Ambulatory Visit (INDEPENDENT_AMBULATORY_CARE_PROVIDER_SITE_OTHER): Payer: Medicare Other | Admitting: Sports Medicine

## 2019-07-08 DIAGNOSIS — M79674 Pain in right toe(s): Secondary | ICD-10-CM

## 2019-07-08 DIAGNOSIS — Z9229 Personal history of other drug therapy: Secondary | ICD-10-CM | POA: Diagnosis not present

## 2019-07-08 DIAGNOSIS — B351 Tinea unguium: Secondary | ICD-10-CM | POA: Diagnosis not present

## 2019-07-08 DIAGNOSIS — M79675 Pain in left toe(s): Secondary | ICD-10-CM | POA: Diagnosis not present

## 2019-07-08 DIAGNOSIS — J309 Allergic rhinitis, unspecified: Secondary | ICD-10-CM

## 2019-07-08 DIAGNOSIS — E119 Type 2 diabetes mellitus without complications: Secondary | ICD-10-CM

## 2019-07-08 DIAGNOSIS — I739 Peripheral vascular disease, unspecified: Secondary | ICD-10-CM

## 2019-07-08 NOTE — Progress Notes (Signed)
Subjective: Madeline Wood is a 73 y.o. female patient seen today in office with complaint of mildly painful thickened and elongated toenails; unable to trim. Patient has no other pedal complaints at this time.   Last A1c 6.6 same as previous    Patient Active Problem List   Diagnosis Date Noted  . Allergic rhinitis 09/13/2018  . Bilateral high frequency sensorineural hearing loss 01/17/2018  . Subjective tinnitus of left ear 01/17/2018  . Atherosclerosis of native coronary artery of native heart without angina pectoris 07/23/2017  . History of food allergy 01/05/2016  . Angio-edema 11/17/2015  . Allergic rhinoconjunctivitis, seasonal and perennial 05/04/2015  . Insomnia 08/24/2014  . Bilateral leg edema 04/17/2014  . Diabetes mellitus without complication ([place])   . Essential hypertension   . Gout   . Mixed hyperlipidemia   . 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 Anxiety   . Obesity   . Knee pain   . Positive TB test   . Hiatal hernia   . GERD (gastroesophageal reflux disease)   . Coronary atherosclerosis 06/06/2010  . GERD 06/06/2010  . History of colonic polyps 06/06/2010    Current Outpatient Medications on File Prior to Visit  Medication Sig Dispense Refill  . ALPRAZolam (XANAX) 1 MG tablet Take 1 tablet (1 mg total) by mouth 2 (two) times daily. 60 tablet 2  . aspirin 81 MG tablet Take 81 mg by mouth daily.      [name] atorvastatin (LIPITOR) 80 MG tablet TAKE 1 TABLET BY MOUTH EVERY DAY 90 tablet 3  . fexofenadine (ALLEGRA) 180 MG tablet Take 1 tablet (180 mg total) by mouth daily. 30 tablet 5  . fish oil-omega-3 fatty acids 1000 MG capsule Take 1 g by mouth. Takes occasionally    . fluticasone (FLONASE) 50 MCG/ACT nasal spray Place 1 spray into both nostrils 2 (two) times daily. 48 g 3  . levalbuterol (XOPENEX HFA) 45 MCG/ACT inhaler Inhale 2 puffs every 4 (four) hours as needed into the lungs for wheezing. 1 Inhaler 12  . losartan (COZAAR) 25 MG tablet TAKE 2 TABLETS (50MG ) BY MOUTH  EVERY DAY 60 tablet 0  . meloxicam (MOBIC) 7.5 MG tablet TAKE 1 TABLET BY MOUTH EVERY DAY 30 tablet 0  . metoprolol tartrate (LOPRESSOR) 50 MG tablet TAKE 1 TABLET EVERY MORNING AND 1 AND 1/2 TABLET EVERY EVENING 225 tablet 1  . Misc Natural Products (GLUCOSAMINE CHOND COMPLEX/MSM PO) Take by mouth. Occasionally    . montelukast (SINGULAIR) 10 MG tablet TAKE 1 TABLET BY MOUTH EVERYDAY AT BEDTIME 90 tablet 1  . Multiple Vitamin (MULTIVITAMIN) tablet Take 1 tablet by mouth. occasionally    . nitroGLYCERIN (NITROSTAT) 0.4 MG SL tablet PLACE 1 TABLET UNDER THE TONGUE EVERY 5 MINUTES AS NEEDED FOR CHEST PAIN. 75 tablet 1  . pantoprazole (PROTONIX) 40 MG tablet TAKE 1 TABLET BY MOUTH EVERY DAY 90 tablet 2  . triamterene-hydrochlorothiazide (MAXZIDE-25) 37.5-25 MG tablet TAKE 1 TABLET BY MOUTH EVERY DAY 90 tablet 2  . zolpidem (AMBIEN) 10 MG tablet TAKE 1 TABLET BY MOUTH EVERY DAY AT BEDTIME AS NEEDED 30 tablet 0  . [DISCONTINUED] omeprazole (PRILOSEC OTC) 20 MG tablet Take 1 tablet (20 mg total) by mouth daily. 30 tablet 1   No current facility-administered medications on file prior to visit.     Allergies  Allergen Reactions  . Ciprofloxacin   . Citalopram Hydrobromide   . Escitalopram Oxalate   . Paroxetine   . Polysporin [Bacitracin-Polymyxin B]  Objective: Physical Exam  General: Well developed, nourished, no acute distress, awake, alert and oriented x 3  Vascular: Dorsalis pedis artery 1/4 bilateral, Posterior tibial artery 1/4 bilateral, skin temperature warm to warm proximal to distal bilateral lower extremities, no varicosities, pedal hair present bilateral.  Neurological: Gross sensation present via light touch bilateral.   Dermatological: Skin is warm, dry, and supple bilateral, Nails 1-10 are tender, long, thick, and discolored with mild subungal debris, no webspace macerations present bilateral, no open lesions present bilateral, no callus/corns/hyperkeratotic tissue present  bilateral. No signs of infection bilateral.  Musculoskeletal: Asymptomatic pes planus and bunion boney deformities noted bilateral. Muscular strength within normal limits without painon range of motion. No pain with calf compression bilateral.  Assessment and Plan:  Problem List Items Addressed This Visit      Endocrine   Diabetes mellitus without complication ([place])    Other Visit Diagnoses    Pain due to onychomycosis of toenails of both feet    -  Primary   PVD (peripheral vascular disease) ([place])       HX: long term anticoagulant use         -Examined patient.  -Discussed treatment options for painful mycotic nail and encouraged daily inspection of feet in setting of diabetes -Mechanically debrided and reduced mycotic nails with sterile nail nipper and dremel nail file without incident.  -Patient to return in 2.5 to 3 months for follow up evaluation or sooner if symptoms worsen.  [name], [name]

## 2019-07-11 ENCOUNTER — Ambulatory Visit (INDEPENDENT_AMBULATORY_CARE_PROVIDER_SITE_OTHER): Payer: Medicare Other | Admitting: Internal Medicine

## 2019-07-11 ENCOUNTER — Encounter: Payer: Self-pay | Admitting: Internal Medicine

## 2019-07-11 ENCOUNTER — Other Ambulatory Visit: Payer: Self-pay

## 2019-07-11 VITALS — BP 124/76 | HR 65 | Temp 97.3°F | Ht 66.0 in | Wt 215.6 lb

## 2019-07-11 DIAGNOSIS — I2510 Atherosclerotic heart disease of native coronary artery without angina pectoris: Secondary | ICD-10-CM

## 2019-07-11 DIAGNOSIS — E782 Mixed hyperlipidemia: Secondary | ICD-10-CM

## 2019-07-11 DIAGNOSIS — I10 Essential (primary) hypertension: Secondary | ICD-10-CM

## 2019-07-11 NOTE — Patient Instructions (Signed)
Medication Instructions:  Your physician recommends that you continue on your current medications as directed. Please refer to the Current Medication list given to you today.  *If you need a refill on your cardiac medications before your next appointment, please call your pharmacy*   Follow-Up: At [facility], you and your health needs are our priority.  As part of our continuing mission to provide you with exceptional heart care, we have created designated Provider Care Teams.  These Care Teams include your primary Cardiologist (physician) and Advanced Practice Providers (APPs -  Physician Assistants and Nurse Practitioners) who all work together to provide you with the care you need, when you need it.  Your next appointment:   12 months  The format for your next appointment:   In Person  Provider:   You may see Dr. Hilty or one of the following Advanced Practice Providers on your designated Care Team:    Hao Meng, PA-C  Angela Duke, PA-C or   Krista Kroeger, PA-C   Other Instructions   

## 2019-07-11 NOTE — Progress Notes (Signed)
OFFICE NOTE  Chief Complaint:  Routine follow-up  Primary Care Physician: [name], MD  HPI:  Madeline Wood is a 73 year old female patient formerly followed by Dr. Rollene Fare, who works as a Photographer. She has a history of coronary disease and had a drug-eluting stent placed to the distal RCA in 2006. She had a negative Myoview in 2012. Just has a history of obesity, hypertension and dyslipidemia. In 2012 underwent back surgery by Dr. Lynann Bologna and has had a good recovery from that.  Recently she had some problems with low blood pressure and had a decrease in her losartan dose to 50 mg once daily which has helped. She has however been having some lower extremity swelling.  When she last saw Dr. Rollene Fare in April 2014, he recommended starting her Dyazide to one full tablet daily. She did not start that given her concern about the change in her blood pressure. It is noted however her blood pressure is slightly higher today 150/84. She is asymptomatic, denying any chest pain, shortness of breath, palpitations, presyncope or syncopal symptoms.  Madeline Wood returns today for followup.  She denies any chest pain or worsening shortness of breath. Recent laboratory work shows good control of her cholesterol.  She is currently on atorvastatin 80 mg daily and recently had that he had discontinued due to excellent cholesterol control. She is also on aspirin and Plavix as well as losartan and Lopressor.  06/01/2016  Madeline Wood returns today for follow-up. She's done very well over he past year. r worsening shortness of breath. Weight is stable and I've encouraged her to continue to work on lowering it. Cholesterol control is been excellent. She has been on long-standing aspirin and Plavix however her stent was in 2006. Is not clear that there is an ongoing indication for dual antiplatelet therapy.Blood pressure is well controlled. EKG shows normal sinus rhythm at 62.  07/23/2017  Mrs.  Wood returns today for follow-up.  She denies any new symptoms such as chest pain or worsening shortness of breath.  She had repeat lipid testing and May which showed an LDL C of 75, down slightly from her most recent testing.  Her hemoglobin A1c also is improved at 5.9.  This is mostly with dietary changes, what weight has been fairly stable.  She is not on any diabetic medications.  Reports a decrease in exercise recently and is committed to restarting that.  07/03/2018  Madeline Wood is seen today for routine follow-up.  Overall she continues to be without complaints.  Blood pressure initially was elevated 156/90 have her recheck came down to 144/88.  She was late for the appointment today and rush to get to the office.  She denies any new chest pain or worsening shortness of breath.  The lipid panel in July 2019 which showed total cholesterol 169, HDL 66, triglycerides 47 and LDL 89.  This represents good control however is not a goal LDL less than 70.  07/11/2019  Madeline Wood returns today for follow-up.  Overall she is well.  Her weight is stable.  Recent labs showed HDL 73 LDL 87.  She had extensive work-up as part of an insurance physical with multiple labs that are all pretty good.  Her blood pressure today was 124/76.  EKG shows sinus rhythm.  PMHx:  Past Medical History:  Diagnosis Date  . 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 Anxiety   . CAD (coronary artery disease)    stent of distal  RCA in 2006  . Diabetes mellitus without complication ([place])   . Diverticulosis   . GERD (gastroesophageal reflux disease)   . Gout   . Hiatal hernia   . History of nuclear stress test 03/2011   negative lexiscan myoview; normal perfusion  . Hyperlipidemia   . Hypertension   . Knee pain   . Obesity   . Positive TB test   . Venous insufficiency     Past Surgical History:  Procedure Laterality Date  . ABDOMINAL HYSTERECTOMY    . BACK SURGERY  2012  . BREAST LUMPECTOMY    . CORONARY ANGIOPLASTY WITH STENT  PLACEMENT  06/29/2005   [place] 2.5x87mm DES to distal RCA (Dr. Tami Ribas)  . TRANSTHORACIC ECHOCARDIOGRAM  12/20/2012   EF [id number], normal systolic function, mild hypokinesis of inf myocardium; calcified MV annulua, LA & RA mildly dilated    FAMHx:  Family History  Problem Relation Age of Onset  . Diabetes Sister   . Pancreatic cancer Brother   . Ovarian cancer Mother   . Other Father        homicide  . Heart attack Brother        x2  . Hypertension Brother   . Hypertension Sister     SOCHx:   reports that she quit smoking about 17 years ago. She has never used smokeless tobacco. She reports that she does not drink alcohol or use drugs.  ALLERGIES:  Allergies  Allergen Reactions  . Ciprofloxacin   . Citalopram Hydrobromide   . Escitalopram Oxalate   . Paroxetine   . Polysporin [Bacitracin-Polymyxin B]     ROS: Pertinent items noted in HPI and remainder of comprehensive ROS otherwise negative.  HOME MEDS: Current Outpatient Medications  Medication Sig Dispense Refill  . ALPRAZolam (XANAX) 1 MG tablet Take 1 tablet (1 mg total) by mouth 2 (two) times daily. 60 tablet 2  . aspirin 81 MG tablet Take 81 mg by mouth daily.      [name] atorvastatin (LIPITOR) 80 MG tablet TAKE 1 TABLET BY MOUTH EVERY DAY 90 tablet 3  . fexofenadine (ALLEGRA) 180 MG tablet Take 1 tablet (180 mg total) by mouth daily. 30 tablet 5  . fish oil-omega-3 fatty acids 1000 MG capsule Take 1 g by mouth. Takes occasionally    . fluticasone (FLONASE) 50 MCG/ACT nasal spray Place 1 spray into both nostrils 2 (two) times daily. 48 g 3  . levalbuterol (XOPENEX HFA) 45 MCG/ACT inhaler Inhale 2 puffs every 4 (four) hours as needed into the lungs for wheezing. 1 Inhaler 12  . losartan (COZAAR) 25 MG tablet TAKE 2 TABLETS (50MG ) BY MOUTH EVERY DAY 60 tablet 0  . meloxicam (MOBIC) 7.5 MG tablet TAKE 1 TABLET BY MOUTH EVERY DAY 30 tablet 0  . metoprolol tartrate (LOPRESSOR) 50 MG tablet TAKE 1 TABLET EVERY MORNING AND 1 AND  1/2 TABLET EVERY EVENING 225 tablet 1  . Misc Natural Products (GLUCOSAMINE CHOND COMPLEX/MSM PO) Take by mouth. Occasionally    . montelukast (SINGULAIR) 10 MG tablet TAKE 1 TABLET BY MOUTH EVERYDAY AT BEDTIME 90 tablet 1  . Multiple Vitamin (MULTIVITAMIN) tablet Take 1 tablet by mouth. occasionally    . nitroGLYCERIN (NITROSTAT) 0.4 MG SL tablet PLACE 1 TABLET UNDER THE TONGUE EVERY 5 MINUTES AS NEEDED FOR CHEST PAIN. 75 tablet 1  . pantoprazole (PROTONIX) 40 MG tablet TAKE 1 TABLET BY MOUTH EVERY DAY 90 tablet 2  . triamterene-hydrochlorothiazide (MAXZIDE-25) 37.5-25 MG tablet TAKE 1 TABLET BY  MOUTH EVERY DAY 90 tablet 2  . zolpidem (AMBIEN) 10 MG tablet TAKE 1 TABLET BY MOUTH EVERY DAY AT BEDTIME AS NEEDED 30 tablet 0   No current facility-administered medications for this visit.     LABS/IMAGING: No results found for this or any previous visit (from the past 48 hour(s)). No results found.  VITALS: Pulse 65   Temp (!) 97.3 F (36.3 C)   Ht 5\' 6"  (1.676 m)   Wt 215 lb 9.6 oz (97.8 kg)   SpO2 98%   BMI 34.80 kg/m   EXAM: General appearance: alert and no distress Neck: no adenopathy, no carotid bruit, no JVD, supple, symmetrical, trachea midline and thyroid not enlarged, symmetric, no tenderness/mass/nodules Lungs: clear to auscultation bilaterally Heart: regular rate and rhythm, S1, S2 normal, no murmur, click, rub or gallop Abdomen: soft, non-tender; bowel sounds normal; no masses,  no organomegaly and obese Extremities: edema 1+ bilaterally and no venous stasis changes or varicose veins Pulses: 2+ and symmetric Skin: Skin color, texture, turgor normal. No rashes or lesions Neurologic: Grossly normal Psych: Pleasant mood, affect  EKG: Normal sinus rhythm 65-personally reviewed  ASSESSMENT: 1. Coronary disease status post PCI to the distal RCA in 2006 (DES) 2. Obesity 3. Hypertension 4. Dyslipidemia  PLAN: 1.   Madeline Wood continues to do well without any further  chest pain or worsening shortness of breath.  She is now 14 years after stenting of the RCA.  Lipids are well controlled.  Her blood pressure is at goal.  No changes were made to her medicines today.  Follow-up annually or sooner as necessary.  Pixie Casino, MD, [place], [place] Director of the [facility] Attending Cardiologist  Direct Dial: [phone]  Fax: [phone]  Website:  [email or web address]  [place] 07/11/2019, 11:02 AM

## 2019-07-21 ENCOUNTER — Ambulatory Visit (INDEPENDENT_AMBULATORY_CARE_PROVIDER_SITE_OTHER): Payer: Medicare Other

## 2019-07-21 DIAGNOSIS — J309 Allergic rhinitis, unspecified: Secondary | ICD-10-CM

## 2019-08-01 ENCOUNTER — Ambulatory Visit (INDEPENDENT_AMBULATORY_CARE_PROVIDER_SITE_OTHER): Payer: Medicare Other | Admitting: *Deleted

## 2019-08-01 DIAGNOSIS — J309 Allergic rhinitis, unspecified: Secondary | ICD-10-CM

## 2019-08-09 ENCOUNTER — Telehealth: Payer: Self-pay | Admitting: Allergy

## 2019-08-09 NOTE — Telephone Encounter (Signed)
Please call patient to follow up. She is also due for an office visit.   Patient called as she used a chap stick and afterwards had some itching after using it.  Lips are not itchy anymore but slightly puffy.  No respiratory issues.  She has been putting A&D ointment on it.  She took allegra this morning. Advised her to only use plain vaseline for the lips and take some benadryl as needed. Call back if worsening. Take pictures of the lips and ingredients of the chap stick.

## 2019-08-11 NOTE — Telephone Encounter (Addendum)
Pt states that she is doing okay, lips have cleared up and skin that peeled has healed.  Bought the vaseline has been using that.  Pt was sent to the front desk to schedule appointment with Dr [name].  09/29/2019

## 2019-08-12 ENCOUNTER — Ambulatory Visit (INDEPENDENT_AMBULATORY_CARE_PROVIDER_SITE_OTHER): Payer: Medicare Other

## 2019-08-12 DIAGNOSIS — J309 Allergic rhinitis, unspecified: Secondary | ICD-10-CM

## 2019-08-25 ENCOUNTER — Ambulatory Visit (INDEPENDENT_AMBULATORY_CARE_PROVIDER_SITE_OTHER): Payer: Medicare Other

## 2019-08-25 DIAGNOSIS — J309 Allergic rhinitis, unspecified: Secondary | ICD-10-CM

## 2019-08-27 ENCOUNTER — Other Ambulatory Visit: Payer: Self-pay | Admitting: Internal Medicine

## 2019-08-27 ENCOUNTER — Other Ambulatory Visit: Payer: Self-pay | Admitting: Family Medicine

## 2019-08-27 NOTE — Telephone Encounter (Signed)
Rx request sent to pharmacy.  

## 2019-08-28 NOTE — Telephone Encounter (Signed)
Ok to refill??  Last office visit 05/20/2019.  Last refill 05/23/2019.

## 2019-09-09 ENCOUNTER — Ambulatory Visit (INDEPENDENT_AMBULATORY_CARE_PROVIDER_SITE_OTHER): Payer: Medicare Other

## 2019-09-09 ENCOUNTER — Telehealth: Payer: Self-pay | Admitting: Internal Medicine

## 2019-09-09 DIAGNOSIS — J309 Allergic rhinitis, unspecified: Secondary | ICD-10-CM

## 2019-09-09 NOTE — Telephone Encounter (Signed)
New Message      We are recommending the COVID-19 vaccine to all of our patients. Cardiac medications (including blood thinners) should not deter anyone from being vaccinated and there is no need to hold any of those medications prior to vaccine administration.     Currently, there is a hotline to call (active 09/05/19) to schedule vaccination appointments as no walk-ins will be accepted.   Number: [phone]    If you have further questions or concerns about the vaccine process, please visit [email or web address] or contact your primary care physician.         

## 2019-09-16 ENCOUNTER — Encounter: Payer: Self-pay | Admitting: Sports Medicine

## 2019-09-16 ENCOUNTER — Ambulatory Visit (INDEPENDENT_AMBULATORY_CARE_PROVIDER_SITE_OTHER): Payer: Medicare Other | Admitting: Sports Medicine

## 2019-09-16 ENCOUNTER — Other Ambulatory Visit: Payer: Self-pay

## 2019-09-16 VITALS — Temp 97.0°F

## 2019-09-16 DIAGNOSIS — M79674 Pain in right toe(s): Secondary | ICD-10-CM | POA: Diagnosis not present

## 2019-09-16 DIAGNOSIS — E119 Type 2 diabetes mellitus without complications: Secondary | ICD-10-CM | POA: Diagnosis not present

## 2019-09-16 DIAGNOSIS — Z9229 Personal history of other drug therapy: Secondary | ICD-10-CM | POA: Diagnosis not present

## 2019-09-16 DIAGNOSIS — M79675 Pain in left toe(s): Secondary | ICD-10-CM | POA: Diagnosis not present

## 2019-09-16 DIAGNOSIS — I739 Peripheral vascular disease, unspecified: Secondary | ICD-10-CM

## 2019-09-16 DIAGNOSIS — B351 Tinea unguium: Secondary | ICD-10-CM

## 2019-09-16 NOTE — Progress Notes (Signed)
Subjective: Madeline Wood is a 74 y.o. female patient seen today in office with complaint of mildly painful thickened and elongated toenails; unable to trim.  Reports that her first toes are sore and she has realized that as her nails have grown out they have gotten a little sore almost could not wait until nail trim due to soreness at first toes right greater than left.  Patient denies redness warmth drainage or any other constitutional symptoms at this time.  Patient has no other pedal complaints at this time.   A1c no longer recorded consider prediabetes.   Patient Active Problem List   Diagnosis Date Noted  . Allergic rhinitis 09/13/2018  . Bilateral high frequency sensorineural hearing loss 01/17/2018  . Subjective tinnitus of left ear 01/17/2018  . Atherosclerosis of native coronary artery of native heart without angina pectoris 07/23/2017  . History of food allergy 01/05/2016  . Angio-edema 11/17/2015  . Allergic rhinoconjunctivitis, seasonal and perennial 05/04/2015  . Insomnia 08/24/2014  . Bilateral leg edema 04/17/2014  . Diabetes mellitus without complication ([place])   . Essential hypertension   . Gout   . Mixed hyperlipidemia   . 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 Anxiety   . Obesity   . Knee pain   . Positive TB test   . Hiatal hernia   . GERD (gastroesophageal reflux disease)   . Coronary atherosclerosis 06/06/2010  . GERD 06/06/2010  . History of colonic polyps 06/06/2010    Current Outpatient Medications on File Prior to Visit  Medication Sig Dispense Refill  . ALPRAZolam (XANAX) 1 MG tablet Take 1 tablet (1 mg total) by mouth 2 (two) times daily. 60 tablet 2  . aspirin 81 MG tablet Take 81 mg by mouth daily.      [name] atorvastatin (LIPITOR) 80 MG tablet TAKE 1 TABLET BY MOUTH EVERY DAY 90 tablet 3  . fexofenadine (ALLEGRA) 180 MG tablet Take 1 tablet (180 mg total) by mouth daily. 30 tablet 5  . fish oil-omega-3 fatty acids 1000 MG capsule Take 1 g by mouth. Takes  occasionally    . fluticasone (FLONASE) 50 MCG/ACT nasal spray Place 1 spray into both nostrils 2 (two) times daily. 48 g 3  . levalbuterol (XOPENEX HFA) 45 MCG/ACT inhaler Inhale 2 puffs every 4 (four) hours as needed into the lungs for wheezing. 1 Inhaler 12  . losartan (COZAAR) 25 MG tablet TAKE 2 TABLETS (50MG ) BY MOUTH EVERY DAY 180 tablet 1  . meloxicam (MOBIC) 7.5 MG tablet TAKE 1 TABLET BY MOUTH EVERY DAY 30 tablet 0  . metoprolol tartrate (LOPRESSOR) 50 MG tablet TAKE 1 TABLET EVERY MORNING AND 1 AND 1/2 TABLET EVERY EVENING 225 tablet 1  . Misc Natural Products (GLUCOSAMINE CHOND COMPLEX/MSM PO) Take by mouth. Occasionally    . montelukast (SINGULAIR) 10 MG tablet TAKE 1 TABLET BY MOUTH EVERYDAY AT BEDTIME 90 tablet 1  . Multiple Vitamin (MULTIVITAMIN) tablet Take 1 tablet by mouth. occasionally    . nitroGLYCERIN (NITROSTAT) 0.4 MG SL tablet PLACE 1 TABLET UNDER THE TONGUE EVERY 5 MINUTES AS NEEDED FOR CHEST PAIN. 75 tablet 1  . pantoprazole (PROTONIX) 40 MG tablet TAKE 1 TABLET BY MOUTH EVERY DAY 90 tablet 2  . triamterene-hydrochlorothiazide (MAXZIDE-25) 37.5-25 MG tablet TAKE 1 TABLET BY MOUTH EVERY DAY 90 tablet 2  . zolpidem (AMBIEN) 10 MG tablet TAKE 1 TABLET BY MOUTH EVERY DAY AT BEDTIME AS NEEDED 30 tablet 0  . [DISCONTINUED] omeprazole (PRILOSEC OTC) 20 MG tablet  Take 1 tablet (20 mg total) by mouth daily. 30 tablet 1   No current facility-administered medications on file prior to visit.    Allergies  Allergen Reactions  . Ciprofloxacin   . Citalopram Hydrobromide   . Escitalopram Oxalate   . Paroxetine   . Polysporin [Bacitracin-Polymyxin B]     Objective: Physical Exam  General: Well developed, nourished, no acute distress, awake, alert and oriented x 3  Vascular: Dorsalis pedis artery 1/4 bilateral, Posterior tibial artery 1/4 bilateral, skin temperature warm to warm proximal to distal bilateral lower extremities, no varicosities, pedal hair present  bilateral.  Neurological: Gross sensation present via light touch bilateral.   Dermatological: Skin is warm, dry, and supple bilateral, Nails 1-10 are tender, long, thick, and discolored with mild subungal debris, no acute ingrowing noted, no webspace macerations present bilateral, no open lesions present bilateral, no callus/corns/hyperkeratotic tissue present bilateral. No signs of infection bilateral.  Musculoskeletal: Asymptomatic pes planus and bunion boney deformities noted bilateral. Muscular strength within normal limits without painon range of motion. No pain with calf compression bilateral.  Assessment and Plan:  Problem List Items Addressed This Visit      Endocrine   Diabetes mellitus without complication ([place])    Other Visit Diagnoses    Pain due to onychomycosis of toenails of both feet    -  Primary   PVD (peripheral vascular disease) ([place])       HX: long term anticoagulant use         -Examined patient.  -Discussed treatment options for painful mycotic nail and encouraged weekly filing to help with any pain in the first toes -Mechanically debrided and reduced mycotic nails with sterile nail nipper and dremel nail file without incident.  -Patient to return in 2.5  months for follow up evaluation or sooner if symptoms worsen.  [name], [name]

## 2019-09-17 ENCOUNTER — Telehealth: Payer: Self-pay | Admitting: Allergy

## 2019-09-17 NOTE — Telephone Encounter (Signed)
Called and spoke with Madeline Wood. Patient is just wondering if it is safe to receive her COVID vaccine. Patient denies any reactions to vaccines in the past. Informed patient to take her antihistamine the morning of, carry her Epipen, and wait in the office afterwards to be monitored. Patient voiced understanding.

## 2019-09-17 NOTE — Telephone Encounter (Signed)
Pt needs to talk with you about getting the covid shot [phone]

## 2019-09-29 ENCOUNTER — Ambulatory Visit (INDEPENDENT_AMBULATORY_CARE_PROVIDER_SITE_OTHER): Payer: Medicare Other | Admitting: Allergy and Immunology

## 2019-09-29 ENCOUNTER — Other Ambulatory Visit: Payer: Self-pay

## 2019-09-29 ENCOUNTER — Encounter: Payer: Self-pay | Admitting: Allergy and Immunology

## 2019-09-29 ENCOUNTER — Ambulatory Visit: Payer: Self-pay

## 2019-09-29 DIAGNOSIS — H1010 Acute atopic conjunctivitis, unspecified eye: Secondary | ICD-10-CM

## 2019-09-29 DIAGNOSIS — T7840XD Allergy, unspecified, subsequent encounter: Secondary | ICD-10-CM | POA: Diagnosis not present

## 2019-09-29 DIAGNOSIS — K219 Gastro-esophageal reflux disease without esophagitis: Secondary | ICD-10-CM

## 2019-09-29 DIAGNOSIS — J302 Other seasonal allergic rhinitis: Secondary | ICD-10-CM

## 2019-09-29 DIAGNOSIS — J3089 Other allergic rhinitis: Secondary | ICD-10-CM | POA: Diagnosis not present

## 2019-09-29 DIAGNOSIS — T7840XA Allergy, unspecified, initial encounter: Secondary | ICD-10-CM | POA: Insufficient documentation

## 2019-09-29 DIAGNOSIS — J309 Allergic rhinitis, unspecified: Secondary | ICD-10-CM

## 2019-09-29 MED ORDER — FLUTICASONE PROPIONATE 50 MCG/ACT NA SUSP: 1.0000 | Freq: Every day | NASAL | 3 refills | Status: DC | PRN

## 2019-09-29 NOTE — Progress Notes (Signed)
Follow-up Note  RE: Madeline Wood MRN: [id number] DOB: June 18, 1946 Date of Office Visit: 09/29/2019  Primary care provider: [name], MD Referring provider: Orlena Sheldon, PA-C  History of present illness: Madeline Wood is a 74 y.o. female with allergic rhinitis and history of angioedema presenting today for follow-up.  She was last seen in this clinic in January 2020.  She reports that approximately 2 months ago she used a medicated lip balm for the first time and the next day developed swelling of the lips.  The lip balm contained octinoxate 7.5% and oxybenzone 3.5%.  She stopped using lip balm and has not had recurrence of lip angioedema.  She denies concomitant urticaria, cardiopulmonary symptoms, and/or GI symptoms.  She is receiving maintenance dose immunotherapy injections without problems or complications.  Her nasal allergy symptoms have been well controlled with fluticasone nasal spray as needed.  She notes that she takes montelukast sporadically as needed, however does not perceive benefit from this medication and wonders if she can discontinue montelukast.  She takes pantoprazole daily and reports that her acid reflux is well controlled.  Assessment and plan: Allergic rhinoconjunctivitis, seasonal and perennial  Continue appropriate allergen avoidance measures, aeroallergen immunotherapy as prescribed and as tolerated, and fluticasone nasal spray as needed.  Nasal saline spray (i.e. Simply Saline) is recommended prior to medicated nasal sprays and as needed.  May discontinue montelukast due to lack of perceived benefit from this medication.  Allergic reaction The patient's history suggests angioedema/allergic reaction to lip balm containing octinoxate and oxybenzone.  The problem started within 24 hours of the first use of this lip balm and has not recurred since discontinuing the lip balm.  Continue avoidance of products containing octinoxate and oxybenzone.  Should  symptoms recur, a journal is to be kept recording any foods eaten, beverages consumed, and medications taken within a 6 hour time period prior to the onset of symptoms, as well as record activities being performed, and environmental conditions. For any symptoms concerning for anaphylaxis, epinephrine is to be administered and 911 is to be called immediately.  If this problem recurs, progresses, or changes in character, we will evaluate further.  GERD (gastroesophageal reflux disease)  For now, continue appropriate lifestyle modifications and pantoprazole as prescribed.   Meds ordered this encounter  Medications  . fluticasone (FLONASE) 50 MCG/ACT nasal spray    Sig: Place 1-2 sprays into both nostrils daily as needed for allergies or rhinitis.    Dispense:  48 g    Refill:  3    Physical examination: Blood pressure 134/86, pulse 67, temperature (!) 97.1 F (36.2 C), temperature source Temporal, resp. rate 16, height 5\' 6"  (1.676 m), weight 218 lb 3.2 oz (99 kg), SpO2 99 %.  General: Alert, interactive, in no acute distress. HEENT: TMs pearly gray, turbinates mildly edematous without discharge, post-pharynx mildly erythematous. Neck: Supple without lymphadenopathy. Lungs: Clear to auscultation without wheezing, rhonchi or rales. CV: Normal S1, S2 without murmurs. Skin: Warm and dry, without lesions or rashes.  The following portions of the patient's history were reviewed and updated as appropriate: allergies, current medications, past family history, past medical history, past social history, past surgical history and problem list.  Current Outpatient Medications  Medication Sig Dispense Refill  . ALPRAZolam (XANAX) 1 MG tablet Take 1 tablet (1 mg total) by mouth 2 (two) times daily. 60 tablet 2  . aspirin 81 MG tablet Take 81 mg by mouth daily.      [name]  atorvastatin (LIPITOR) 80 MG tablet TAKE 1 TABLET BY MOUTH EVERY DAY 90 tablet 3  . fexofenadine (ALLEGRA) 180 MG tablet Take 1  tablet (180 mg total) by mouth daily. 30 tablet 5  . fish oil-omega-3 fatty acids 1000 MG capsule Take 1 g by mouth. Takes occasionally    . fluticasone (FLONASE) 50 MCG/ACT nasal spray Place 1-2 sprays into both nostrils daily as needed for allergies or rhinitis. 48 g 3  . losartan (COZAAR) 25 MG tablet TAKE 2 TABLETS (50MG ) BY MOUTH EVERY DAY 180 tablet 1  . metoprolol tartrate (LOPRESSOR) 50 MG tablet TAKE 1 TABLET EVERY MORNING AND 1 AND 1/2 TABLET EVERY EVENING 225 tablet 1  . Misc Natural Products (GLUCOSAMINE CHOND COMPLEX/MSM PO) Take by mouth. Occasionally    . Multiple Vitamin (MULTIVITAMIN) tablet Take 1 tablet by mouth. occasionally    . nitroGLYCERIN (NITROSTAT) 0.4 MG SL tablet PLACE 1 TABLET UNDER THE TONGUE EVERY 5 MINUTES AS NEEDED FOR CHEST PAIN. 75 tablet 1  . pantoprazole (PROTONIX) 40 MG tablet TAKE 1 TABLET BY MOUTH EVERY DAY 90 tablet 2  . triamterene-hydrochlorothiazide (MAXZIDE-25) 37.5-25 MG tablet TAKE 1 TABLET BY MOUTH EVERY DAY 90 tablet 2  . zolpidem (AMBIEN) 10 MG tablet TAKE 1 TABLET BY MOUTH EVERY DAY AT BEDTIME AS NEEDED 30 tablet 0  . levalbuterol (XOPENEX HFA) 45 MCG/ACT inhaler Inhale 2 puffs every 4 (four) hours as needed into the lungs for wheezing. (Patient not taking: Reported on 09/29/2019) 1 Inhaler 12  . meloxicam (MOBIC) 7.5 MG tablet TAKE 1 TABLET BY MOUTH EVERY DAY (Patient not taking: Reported on 09/29/2019) 30 tablet 0  . montelukast (SINGULAIR) 10 MG tablet TAKE 1 TABLET BY MOUTH EVERYDAY AT BEDTIME (Patient not taking: Reported on 09/29/2019) 90 tablet 1   No current facility-administered medications for this visit.    Allergies  Allergen Reactions  . Ciprofloxacin   . Citalopram Hydrobromide   . Escitalopram Oxalate   . Paroxetine   . Polysporin [Bacitracin-Polymyxin B]    Review of systems: Review of systems negative except as noted in HPI / PMHx.  Past Medical History:  Diagnosis Date  . 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 Angio-edema   . Anxiety    . CAD (coronary artery disease)    stent of distal RCA in 2006  . Diabetes mellitus without complication ([place])   . Diverticulosis   . GERD (gastroesophageal reflux disease)   . Gout   . Hiatal hernia   . History of nuclear stress test 03/2011   negative lexiscan myoview; normal perfusion  . Hyperlipidemia   . Hypertension   . Knee pain   . Obesity   . Positive TB test   . Venous insufficiency     Family History  Problem Relation Age of Onset  . Diabetes Sister   . Pancreatic cancer Brother   . Ovarian cancer Mother   . Other Father        homicide  . Heart attack Brother        x2  . Hypertension Brother   . Hypertension Sister     Social History   Socioeconomic History  . Marital status: Single    Spouse name: Not on file  . Number of children: 1  . Years of education: Not on file  . Highest education level: Not on file  Occupational History  . Occupation: Biomedical engineer  Tobacco Use  . Smoking status: Former Smoker    Quit date: 12/20/2001  Years since quitting: 17.7  . Smokeless tobacco: Never Used  Substance and Sexual Activity  . Alcohol use: No    Alcohol/week: 0.0 standard drinks  . Drug use: No  . Sexual activity: Not on file  Other Topics Concern  . Not on file  Social History Narrative  . Not on file   Social Determinants of Health   Financial Resource Strain:   . Difficulty of Paying Living Expenses: Not on file  Food Insecurity:   . Worried About Charity fundraiser in the Last Year: Not on file  . Ran Out of Food in the Last Year: Not on file  Transportation Needs:   . Lack of Transportation (Medical): Not on file  . Lack of Transportation (Non-Medical): Not on file  Physical Activity:   . Days of Exercise per Week: Not on file  . Minutes of Exercise per Session: Not on file  Stress:   . Feeling of Stress : Not on file  Social Connections:   . Frequency of Communication with Friends and Family: Not on file  . Frequency of Social  Gatherings with Friends and Family: Not on file  . Attends Religious Services: Not on file  . Active Member of Clubs or Organizations: Not on file  . Attends Archivist Meetings: Not on file  . Marital Status: Not on file  Intimate Partner Violence:   . Fear of Current or Ex-Partner: Not on file  . Emotionally Abused: Not on file  . Physically Abused: Not on file  . Sexually Abused: Not on file    I appreciate the opportunity to take part in Alianis's care. Please do not hesitate to contact me with questions.  Sincerely,   R. Edgar Frisk, MD

## 2019-09-29 NOTE — Assessment & Plan Note (Signed)
   Continue appropriate allergen avoidance measures, aeroallergen immunotherapy as prescribed and as tolerated, and fluticasone nasal spray as needed.  Nasal saline spray (i.e. Simply Saline) is recommended prior to medicated nasal sprays and as needed.  May discontinue montelukast due to lack of perceived benefit from this medication.

## 2019-09-29 NOTE — Assessment & Plan Note (Addendum)
The patient's history suggests angioedema/allergic reaction to lip balm containing octinoxate and oxybenzone.  The problem started within 24 hours of the first use of this lip balm and has not recurred since discontinuing the lip balm.  Continue avoidance of products containing octinoxate and oxybenzone.  Should symptoms recur, a journal is to be kept recording any foods eaten, beverages consumed, and medications taken within a 6 hour time period prior to the onset of symptoms, as well as record activities being performed, and environmental conditions. For any symptoms concerning for anaphylaxis, epinephrine is to be administered and 911 is to be called immediately.  If this problem recurs, progresses, or changes in character, we will evaluate further.

## 2019-09-29 NOTE — Assessment & Plan Note (Signed)
   For now, continue appropriate lifestyle modifications and pantoprazole as prescribed.

## 2019-09-29 NOTE — Patient Instructions (Addendum)
Allergic rhinoconjunctivitis, seasonal and perennial  Continue appropriate allergen avoidance measures, aeroallergen immunotherapy as prescribed and as tolerated, and fluticasone nasal spray as needed.  Nasal saline spray (i.e. Simply Saline) is recommended prior to medicated nasal sprays and as needed.  May discontinue montelukast due to lack of perceived benefit from this medication.  Allergic reaction The patient's history suggests angioedema/allergic reaction to lip balm containing octinoxate and oxybenzone.  The problem started within 24 hours of the first use of this lip balm and has not recurred since discontinuing the lip balm.  Continue avoidance of products containing octinoxate and oxybenzone.  Should symptoms recur, a journal is to be kept recording any foods eaten, beverages consumed, and medications taken within a 6 hour time period prior to the onset of symptoms, as well as record activities being performed, and environmental conditions. For any symptoms concerning for anaphylaxis, epinephrine is to be administered and 911 is to be called immediately.  If this problem recurs, progresses, or changes in character, we will evaluate further.  GERD (gastroesophageal reflux disease)  For now, continue appropriate lifestyle modifications and pantoprazole as prescribed.   Return in about 1 year (around 09/28/2020), or if symptoms worsen or fail to improve.

## 2019-10-07 ENCOUNTER — Ambulatory Visit (INDEPENDENT_AMBULATORY_CARE_PROVIDER_SITE_OTHER): Payer: Medicare Other

## 2019-10-07 DIAGNOSIS — J309 Allergic rhinitis, unspecified: Secondary | ICD-10-CM | POA: Diagnosis not present

## 2019-10-28 ENCOUNTER — Ambulatory Visit (INDEPENDENT_AMBULATORY_CARE_PROVIDER_SITE_OTHER): Payer: Medicare Other

## 2019-10-28 DIAGNOSIS — J309 Allergic rhinitis, unspecified: Secondary | ICD-10-CM | POA: Diagnosis not present

## 2019-10-29 ENCOUNTER — Other Ambulatory Visit: Payer: Self-pay | Admitting: Family Medicine

## 2019-10-30 NOTE — Telephone Encounter (Signed)
Ok to refill??  Last office visit 05/20/2019.  Last refill 05/23/2019, #2 refills.

## 2019-11-04 ENCOUNTER — Ambulatory Visit (INDEPENDENT_AMBULATORY_CARE_PROVIDER_SITE_OTHER): Payer: Medicare Other

## 2019-11-04 DIAGNOSIS — J309 Allergic rhinitis, unspecified: Secondary | ICD-10-CM | POA: Diagnosis not present

## 2019-11-17 ENCOUNTER — Ambulatory Visit (INDEPENDENT_AMBULATORY_CARE_PROVIDER_SITE_OTHER): Payer: Medicare Other

## 2019-11-17 DIAGNOSIS — J309 Allergic rhinitis, unspecified: Secondary | ICD-10-CM | POA: Diagnosis not present

## 2019-11-24 ENCOUNTER — Ambulatory Visit (INDEPENDENT_AMBULATORY_CARE_PROVIDER_SITE_OTHER): Payer: Medicare Other

## 2019-11-24 DIAGNOSIS — J309 Allergic rhinitis, unspecified: Secondary | ICD-10-CM | POA: Diagnosis not present

## 2019-11-25 ENCOUNTER — Ambulatory Visit (INDEPENDENT_AMBULATORY_CARE_PROVIDER_SITE_OTHER): Payer: Medicare Other | Admitting: Sports Medicine

## 2019-11-25 ENCOUNTER — Encounter: Payer: Self-pay | Admitting: Sports Medicine

## 2019-11-25 VITALS — Temp 97.3°F

## 2019-11-25 DIAGNOSIS — M79674 Pain in right toe(s): Secondary | ICD-10-CM

## 2019-11-25 DIAGNOSIS — M79675 Pain in left toe(s): Secondary | ICD-10-CM | POA: Diagnosis not present

## 2019-11-25 DIAGNOSIS — E119 Type 2 diabetes mellitus without complications: Secondary | ICD-10-CM

## 2019-11-25 DIAGNOSIS — Z9229 Personal history of other drug therapy: Secondary | ICD-10-CM | POA: Diagnosis not present

## 2019-11-25 DIAGNOSIS — I739 Peripheral vascular disease, unspecified: Secondary | ICD-10-CM | POA: Diagnosis not present

## 2019-11-25 DIAGNOSIS — B351 Tinea unguium: Secondary | ICD-10-CM | POA: Diagnosis not present

## 2019-11-25 NOTE — Progress Notes (Signed)
Subjective: Madeline Wood is a 74 y.o. female patient seen today in office with complaint of mildly painful thickened and elongated toenails; unable to trim.  Reports that her toes are doing better after getting her nails trim denies any changes with medication or any pedal complaints at this time.  Last A1c seen with recorded at 5.9 in June 2020 and does not check her blood sugars currently monitors because consider now prediabetic.  Denies any other pedal complaints at this time.  Patient Active Problem List   Diagnosis Date Noted  . Allergic reaction 09/29/2019  . Allergic rhinitis 09/13/2018  . Bilateral high frequency sensorineural hearing loss 01/17/2018  . Subjective tinnitus of left ear 01/17/2018  . Atherosclerosis of native coronary artery of native heart without angina pectoris 07/23/2017  . History of food allergy 01/05/2016  . Angioedema 11/17/2015  . Allergic rhinoconjunctivitis, seasonal and perennial 05/04/2015  . Insomnia 08/24/2014  . Bilateral leg edema 04/17/2014  . Diabetes mellitus without complication ([place])   . Essential hypertension   . Gout   . Mixed hyperlipidemia   . 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 Anxiety   . Obesity   . Knee pain   . Positive TB test   . Hiatal hernia   . GERD (gastroesophageal reflux disease)   . Coronary atherosclerosis 06/06/2010  . GERD 06/06/2010  . History of colonic polyps 06/06/2010    Current Outpatient Medications on File Prior to Visit  Medication Sig Dispense Refill  . ALPRAZolam (XANAX) 1 MG tablet TAKE 1 TABLET BY MOUTH TWICE A DAY 60 tablet 2  . aspirin 81 MG tablet Take 81 mg by mouth daily.      [name] atorvastatin (LIPITOR) 80 MG tablet TAKE 1 TABLET BY MOUTH EVERY DAY 90 tablet 3  . fexofenadine (ALLEGRA) 180 MG tablet Take 1 tablet (180 mg total) by mouth daily. 30 tablet 5  . fish oil-omega-3 fatty acids 1000 MG capsule Take 1 g by mouth. Takes occasionally    . fluticasone (FLONASE) 50 MCG/ACT nasal spray Place 1-2 sprays  into both nostrils daily as needed for allergies or rhinitis. 48 g 3  . levalbuterol (XOPENEX HFA) 45 MCG/ACT inhaler Inhale 2 puffs every 4 (four) hours as needed into the lungs for wheezing. 1 Inhaler 12  . losartan (COZAAR) 25 MG tablet TAKE 2 TABLETS (50MG ) BY MOUTH EVERY DAY 180 tablet 1  . meloxicam (MOBIC) 7.5 MG tablet TAKE 1 TABLET BY MOUTH EVERY DAY 30 tablet 0  . metoprolol tartrate (LOPRESSOR) 50 MG tablet TAKE 1 TABLET EVERY MORNING AND 1 AND 1/2 TABLET EVERY EVENING 225 tablet 1  . Misc Natural Products (GLUCOSAMINE CHOND COMPLEX/MSM PO) Take by mouth. Occasionally    . montelukast (SINGULAIR) 10 MG tablet TAKE 1 TABLET BY MOUTH EVERYDAY AT BEDTIME 90 tablet 1  . Multiple Vitamin (MULTIVITAMIN) tablet Take 1 tablet by mouth. occasionally    . nitroGLYCERIN (NITROSTAT) 0.4 MG SL tablet PLACE 1 TABLET UNDER THE TONGUE EVERY 5 MINUTES AS NEEDED FOR CHEST PAIN. 75 tablet 1  . pantoprazole (PROTONIX) 40 MG tablet TAKE 1 TABLET BY MOUTH EVERY DAY 90 tablet 2  . triamterene-hydrochlorothiazide (MAXZIDE-25) 37.5-25 MG tablet TAKE 1 TABLET BY MOUTH EVERY DAY 90 tablet 2  . zolpidem (AMBIEN) 10 MG tablet TAKE 1 TABLET BY MOUTH EVERY DAY AT BEDTIME AS NEEDED 30 tablet 0  . [DISCONTINUED] omeprazole (PRILOSEC OTC) 20 MG tablet Take 1 tablet (20 mg total) by mouth daily. 30 tablet 1  No current facility-administered medications on file prior to visit.    Allergies  Allergen Reactions  . Ciprofloxacin   . Citalopram Hydrobromide   . Escitalopram Oxalate   . Paroxetine   . Polysporin [Bacitracin-Polymyxin B]     Objective: Physical Exam  General: Well developed, nourished, no acute distress, awake, alert and oriented x 3  Vascular: Dorsalis pedis artery 1/4 bilateral, Posterior tibial artery 1/4 bilateral, skin temperature warm to warm proximal to distal bilateral lower extremities, no varicosities, pedal hair present bilateral.  Neurological: Gross sensation present via light touch  bilateral.   Dermatological: Skin is warm, dry, and supple bilateral, Nails 1-10 are tender, long, thick, and discolored with mild subungal debris, no acute ingrowing noted, no webspace macerations present bilateral, no open lesions present bilateral, no callus/corns/hyperkeratotic tissue present bilateral. No signs of infection bilateral.  Musculoskeletal: Asymptomatic pes planus and bunion boney deformities noted bilateral. Muscular strength within normal limits without painon range of motion. No pain with calf compression bilateral.  Assessment and Plan:  Problem List Items Addressed This Visit      Endocrine   Diabetes mellitus without complication ([place])    Other Visit Diagnoses    Pain due to onychomycosis of toenails of both feet    -  Primary   PVD (peripheral vascular disease) ([place])       HX: long term anticoagulant use         -Examined patient.  -Re-Discussed treatment options for painful mycotic nails -Mechanically debrided and reduced mycotic nails with sterile nail nipper and dremel nail file without incident.  -Patient to return in 2.5  months for follow up evaluation or sooner if symptoms worsen.  [name], [name]

## 2019-12-01 ENCOUNTER — Ambulatory Visit (INDEPENDENT_AMBULATORY_CARE_PROVIDER_SITE_OTHER): Payer: Medicare Other

## 2019-12-01 DIAGNOSIS — J309 Allergic rhinitis, unspecified: Secondary | ICD-10-CM

## 2019-12-08 ENCOUNTER — Ambulatory Visit (INDEPENDENT_AMBULATORY_CARE_PROVIDER_SITE_OTHER): Payer: Medicare Other

## 2019-12-08 DIAGNOSIS — J309 Allergic rhinitis, unspecified: Secondary | ICD-10-CM | POA: Diagnosis not present

## 2019-12-16 ENCOUNTER — Ambulatory Visit (INDEPENDENT_AMBULATORY_CARE_PROVIDER_SITE_OTHER): Payer: Medicare Other

## 2019-12-16 DIAGNOSIS — J309 Allergic rhinitis, unspecified: Secondary | ICD-10-CM

## 2019-12-23 ENCOUNTER — Ambulatory Visit (INDEPENDENT_AMBULATORY_CARE_PROVIDER_SITE_OTHER): Payer: Medicare Other

## 2019-12-23 DIAGNOSIS — J309 Allergic rhinitis, unspecified: Secondary | ICD-10-CM

## 2019-12-29 ENCOUNTER — Encounter: Payer: Self-pay | Admitting: Family Medicine

## 2019-12-29 DIAGNOSIS — Z1231 Encounter for screening mammogram for malignant neoplasm of breast: Secondary | ICD-10-CM | POA: Diagnosis not present

## 2019-12-30 ENCOUNTER — Ambulatory Visit (INDEPENDENT_AMBULATORY_CARE_PROVIDER_SITE_OTHER): Payer: Medicare Other

## 2019-12-30 DIAGNOSIS — J309 Allergic rhinitis, unspecified: Secondary | ICD-10-CM | POA: Diagnosis not present

## 2019-12-31 ENCOUNTER — Other Ambulatory Visit: Payer: Self-pay | Admitting: Family Medicine

## 2020-01-04 ENCOUNTER — Other Ambulatory Visit: Payer: Self-pay | Admitting: Internal Medicine

## 2020-01-04 DIAGNOSIS — I2510 Atherosclerotic heart disease of native coronary artery without angina pectoris: Secondary | ICD-10-CM

## 2020-01-07 ENCOUNTER — Ambulatory Visit (INDEPENDENT_AMBULATORY_CARE_PROVIDER_SITE_OTHER): Payer: Medicare Other

## 2020-01-07 DIAGNOSIS — J309 Allergic rhinitis, unspecified: Secondary | ICD-10-CM

## 2020-01-14 ENCOUNTER — Ambulatory Visit (INDEPENDENT_AMBULATORY_CARE_PROVIDER_SITE_OTHER): Payer: Medicare Other

## 2020-01-14 DIAGNOSIS — J309 Allergic rhinitis, unspecified: Secondary | ICD-10-CM

## 2020-01-21 ENCOUNTER — Ambulatory Visit (INDEPENDENT_AMBULATORY_CARE_PROVIDER_SITE_OTHER): Payer: Medicare Other

## 2020-01-21 DIAGNOSIS — J309 Allergic rhinitis, unspecified: Secondary | ICD-10-CM | POA: Diagnosis not present

## 2020-01-28 ENCOUNTER — Ambulatory Visit (INDEPENDENT_AMBULATORY_CARE_PROVIDER_SITE_OTHER): Payer: Medicare Other

## 2020-01-28 DIAGNOSIS — J309 Allergic rhinitis, unspecified: Secondary | ICD-10-CM

## 2020-02-03 ENCOUNTER — Other Ambulatory Visit: Payer: Self-pay

## 2020-02-03 ENCOUNTER — Encounter: Payer: Self-pay | Admitting: Sports Medicine

## 2020-02-03 ENCOUNTER — Ambulatory Visit (INDEPENDENT_AMBULATORY_CARE_PROVIDER_SITE_OTHER): Payer: Medicare Other | Admitting: Sports Medicine

## 2020-02-03 VITALS — Temp 97.0°F

## 2020-02-03 DIAGNOSIS — B351 Tinea unguium: Secondary | ICD-10-CM

## 2020-02-03 DIAGNOSIS — M79675 Pain in left toe(s): Secondary | ICD-10-CM

## 2020-02-03 DIAGNOSIS — I739 Peripheral vascular disease, unspecified: Secondary | ICD-10-CM

## 2020-02-03 DIAGNOSIS — Z9229 Personal history of other drug therapy: Secondary | ICD-10-CM

## 2020-02-03 DIAGNOSIS — E119 Type 2 diabetes mellitus without complications: Secondary | ICD-10-CM | POA: Diagnosis not present

## 2020-02-03 DIAGNOSIS — M79674 Pain in right toe(s): Secondary | ICD-10-CM | POA: Diagnosis not present

## 2020-02-03 NOTE — Progress Notes (Signed)
Subjective: Madeline Wood is a 74 y.o. female patient seen today in office with complaint of mildly painful thickened and elongated toenails; unable to trim.  Last A1c not recorded and does not check her blood sugars currently monitors like before.  Denies any other pedal complaints at this time.  Patient Active Problem List   Diagnosis Date Noted  . Allergic reaction 09/29/2019  . Allergic rhinitis 09/13/2018  . Bilateral high frequency sensorineural hearing loss 01/17/2018  . Subjective tinnitus of left ear 01/17/2018  . Atherosclerosis of native coronary artery of native heart without angina pectoris 07/23/2017  . History of food allergy 01/05/2016  . Angioedema 11/17/2015  . Allergic rhinoconjunctivitis, seasonal and perennial 05/04/2015  . Insomnia 08/24/2014  . Bilateral leg edema 04/17/2014  . Diabetes mellitus without complication ([place])   . Essential hypertension   . Gout   . Mixed hyperlipidemia   . 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 Anxiety   . Obesity   . Knee pain   . Positive TB test   . Hiatal hernia   . GERD (gastroesophageal reflux disease)   . Coronary atherosclerosis 06/06/2010  . GERD 06/06/2010  . History of colonic polyps 06/06/2010    Current Outpatient Medications on File Prior to Visit  Medication Sig Dispense Refill  . ALPRAZolam (XANAX) 1 MG tablet TAKE 1 TABLET BY MOUTH TWICE A DAY 60 tablet 2  . aspirin 81 MG tablet Take 81 mg by mouth daily.      [name] atorvastatin (LIPITOR) 80 MG tablet TAKE 1 TABLET BY MOUTH EVERY DAY 90 tablet 3  . fexofenadine (ALLEGRA) 180 MG tablet Take 1 tablet (180 mg total) by mouth daily. 30 tablet 5  . fish oil-omega-3 fatty acids 1000 MG capsule Take 1 g by mouth. Takes occasionally    . fluticasone (FLONASE) 50 MCG/ACT nasal spray Place 1-2 sprays into both nostrils daily as needed for allergies or rhinitis. 48 g 3  . levalbuterol (XOPENEX HFA) 45 MCG/ACT inhaler Inhale 2 puffs every 4 (four) hours as needed into the lungs for  wheezing. 1 Inhaler 12  . losartan (COZAAR) 25 MG tablet TAKE 2 TABLETS (50MG ) BY MOUTH EVERY DAY 180 tablet 1  . meloxicam (MOBIC) 7.5 MG tablet TAKE 1 TABLET BY MOUTH EVERY DAY 30 tablet 0  . metoprolol tartrate (LOPRESSOR) 50 MG tablet TAKE 1 TABLET EVERY MORNING AND 1 AND 1/2 TABLET EVERY EVENING 225 tablet 1  . Misc Natural Products (GLUCOSAMINE CHOND COMPLEX/MSM PO) Take by mouth. Occasionally    . montelukast (SINGULAIR) 10 MG tablet TAKE 1 TABLET BY MOUTH EVERYDAY AT BEDTIME 90 tablet 1  . Multiple Vitamin (MULTIVITAMIN) tablet Take 1 tablet by mouth. occasionally    . nitroGLYCERIN (NITROSTAT) 0.4 MG SL tablet PLACE 1 TABLET UNDER THE TONGUE EVERY 5 MINUTES AS NEEDED FOR CHEST PAIN. 75 tablet 1  . pantoprazole (PROTONIX) 40 MG tablet TAKE 1 TABLET BY MOUTH EVERY DAY 90 tablet 2  . triamterene-hydrochlorothiazide (MAXZIDE-25) 37.5-25 MG tablet TAKE 1 TABLET BY MOUTH EVERY DAY 90 tablet 2  . zolpidem (AMBIEN) 10 MG tablet TAKE 1 TABLET BY MOUTH EVERY DAY AT BEDTIME AS NEEDED 30 tablet 0  . [DISCONTINUED] omeprazole (PRILOSEC OTC) 20 MG tablet Take 1 tablet (20 mg total) by mouth daily. 30 tablet 1   No current facility-administered medications on file prior to visit.    Allergies  Allergen Reactions  . Ciprofloxacin   . Citalopram Hydrobromide   . Escitalopram Oxalate   .  Paroxetine   . Polysporin [Bacitracin-Polymyxin B]     Objective: Physical Exam  General: Well developed, nourished, no acute distress, awake, alert and oriented x 3  Vascular: Dorsalis pedis artery 1/4 bilateral, Posterior tibial artery 1/4 bilateral, skin temperature warm to warm proximal to distal bilateral lower extremities, no varicosities, pedal hair present bilateral.  Neurological: Gross sensation present via light touch bilateral.   Dermatological: Skin is warm, dry, and supple bilateral, Nails 1-10 are tender, long, thick, and discolored with mild subungal debris, no acute ingrowing noted, no  webspace macerations present bilateral, no open lesions present bilateral, no callus/corns/hyperkeratotic tissue present bilateral. No signs of infection bilateral.  Musculoskeletal: Asymptomatic pes planus and bunion boney deformities noted bilateral. Muscular strength within normal limits without painon range of motion. No pain with calf compression bilateral.  Assessment and Plan:  Problem List Items Addressed This Visit      Endocrine   Diabetes mellitus without complication ([place])    Other Visit Diagnoses    Pain due to onychomycosis of toenails of both feet    -  Primary   PVD (peripheral vascular disease) ([place])       HX: long term anticoagulant use         -Examined patient.  -Re-Discussed treatment options for painful mycotic nails -Mechanically debrided and reduced mycotic nails with sterile nail nipper and dremel nail file without incident.  -Patient to return in 2.5  months for follow up evaluation or sooner if symptoms worsen.  Madeline Wood, [name] Subjective: Madeline Wood is a 74 y.o. female patient seen today in office with complaint of mildly painful thickened and elongated toenails; unable to trim.  Reports that her toes are doing better after getting her nails trim denies any changes with medication or any pedal complaints at this time.  Last A1c seen with recorded at 5.9 in June 2020 and does not check her blood sugars currently monitors because consider now prediabetic.  Denies any other pedal complaints at this time.  Patient Active Problem List   Diagnosis Date Noted  . Allergic reaction 09/29/2019  . Allergic rhinitis 09/13/2018  . Bilateral high frequency sensorineural hearing loss 01/17/2018  . Subjective tinnitus of left ear 01/17/2018  . Atherosclerosis of native coronary artery of native heart without angina pectoris 07/23/2017  . History of food allergy 01/05/2016  . Angioedema 11/17/2015  . Allergic rhinoconjunctivitis, seasonal and perennial 05/04/2015   . Insomnia 08/24/2014  . Bilateral leg edema 04/17/2014  . Diabetes mellitus without complication ([place])   . Essential hypertension   . Gout   . Mixed hyperlipidemia   . 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 Anxiety   . Obesity   . Knee pain   . Positive TB test   . Hiatal hernia   . GERD (gastroesophageal reflux disease)   . Coronary atherosclerosis 06/06/2010  . GERD 06/06/2010  . History of colonic polyps 06/06/2010    Current Outpatient Medications on File Prior to Visit  Medication Sig Dispense Refill  . ALPRAZolam (XANAX) 1 MG tablet TAKE 1 TABLET BY MOUTH TWICE A DAY 60 tablet 2  . aspirin 81 MG tablet Take 81 mg by mouth daily.      [name] atorvastatin (LIPITOR) 80 MG tablet TAKE 1 TABLET BY MOUTH EVERY DAY 90 tablet 3  . fexofenadine (ALLEGRA) 180 MG tablet Take 1 tablet (180 mg total) by mouth daily. 30 tablet 5  . fish oil-omega-3 fatty acids 1000 MG capsule Take 1 g by  mouth. Takes occasionally    . fluticasone (FLONASE) 50 MCG/ACT nasal spray Place 1-2 sprays into both nostrils daily as needed for allergies or rhinitis. 48 g 3  . levalbuterol (XOPENEX HFA) 45 MCG/ACT inhaler Inhale 2 puffs every 4 (four) hours as needed into the lungs for wheezing. 1 Inhaler 12  . losartan (COZAAR) 25 MG tablet TAKE 2 TABLETS (50MG ) BY MOUTH EVERY DAY 180 tablet 1  . meloxicam (MOBIC) 7.5 MG tablet TAKE 1 TABLET BY MOUTH EVERY DAY 30 tablet 0  . metoprolol tartrate (LOPRESSOR) 50 MG tablet TAKE 1 TABLET EVERY MORNING AND 1 AND 1/2 TABLET EVERY EVENING 225 tablet 1  . Misc Natural Products (GLUCOSAMINE CHOND COMPLEX/MSM PO) Take by mouth. Occasionally    . montelukast (SINGULAIR) 10 MG tablet TAKE 1 TABLET BY MOUTH EVERYDAY AT BEDTIME 90 tablet 1  . Multiple Vitamin (MULTIVITAMIN) tablet Take 1 tablet by mouth. occasionally    . nitroGLYCERIN (NITROSTAT) 0.4 MG SL tablet PLACE 1 TABLET UNDER THE TONGUE EVERY 5 MINUTES AS NEEDED FOR CHEST PAIN. 75 tablet 1  . pantoprazole (PROTONIX) 40 MG tablet TAKE 1  TABLET BY MOUTH EVERY DAY 90 tablet 2  . triamterene-hydrochlorothiazide (MAXZIDE-25) 37.5-25 MG tablet TAKE 1 TABLET BY MOUTH EVERY DAY 90 tablet 2  . zolpidem (AMBIEN) 10 MG tablet TAKE 1 TABLET BY MOUTH EVERY DAY AT BEDTIME AS NEEDED 30 tablet 0  . [DISCONTINUED] omeprazole (PRILOSEC OTC) 20 MG tablet Take 1 tablet (20 mg total) by mouth daily. 30 tablet 1   No current facility-administered medications on file prior to visit.    Allergies  Allergen Reactions  . Ciprofloxacin   . Citalopram Hydrobromide   . Escitalopram Oxalate   . Paroxetine   . Polysporin [Bacitracin-Polymyxin B]     Objective: Physical Exam  General: Well developed, nourished, no acute distress, awake, alert and oriented x 3  Vascular: Dorsalis pedis artery 1/4 bilateral, Posterior tibial artery 1/4 bilateral, skin temperature warm to warm proximal to distal bilateral lower extremities, no varicosities, pedal hair present bilateral.  Neurological: Gross sensation present via light touch bilateral.   Dermatological: Skin is warm, dry, and supple bilateral, Nails 1-10 are tender, long, thick, and discolored with mild subungal debris, no acute ingrowing noted, no webspace macerations present bilateral, no open lesions present bilateral, no callus/corns/hyperkeratotic tissue present bilateral. No signs of infection bilateral.  Musculoskeletal: Asymptomatic pes planus and bunion boney deformities noted bilateral. Muscular strength within normal limits without painon range of motion. No pain with calf compression bilateral.  Assessment and Plan:  Problem List Items Addressed This Visit      Endocrine   Diabetes mellitus without complication ([place])    Other Visit Diagnoses    Pain due to onychomycosis of toenails of both feet    -  Primary   PVD (peripheral vascular disease) ([place])       HX: long term anticoagulant use         -Examined patient.  -Re-Discussed treatment options for painful mycotic  nails -Mechanically debrided and reduced mycotic nails with sterile nail nipper and dremel nail file without incident.  -Patient to return in 2.5  months for follow up evaluation or sooner if symptoms worsen.  Madeline Wood, [name] Subjective: Madeline Wood is a 74 y.o. female patient seen today in office with complaint of mildly painful thickened and elongated toenails; unable to trim.  Reports that her toes are doing better after getting her nails trim denies any changes with medication  or any pedal complaints at this time.  Last A1c seen with recorded at 5.9 in June 2020 and does not check her blood sugars currently monitors because consider now prediabetic.  Denies any other pedal complaints at this time.  Patient Active Problem List   Diagnosis Date Noted  . Allergic reaction 09/29/2019  . Allergic rhinitis 09/13/2018  . Bilateral high frequency sensorineural hearing loss 01/17/2018  . Subjective tinnitus of left ear 01/17/2018  . Atherosclerosis of native coronary artery of native heart without angina pectoris 07/23/2017  . History of food allergy 01/05/2016  . Angioedema 11/17/2015  . Allergic rhinoconjunctivitis, seasonal and perennial 05/04/2015  . Insomnia 08/24/2014  . Bilateral leg edema 04/17/2014  . Diabetes mellitus without complication ([place])   . Essential hypertension   . Gout   . Mixed hyperlipidemia   . 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 Anxiety   . Obesity   . Knee pain   . Positive TB test   . Hiatal hernia   . GERD (gastroesophageal reflux disease)   . Coronary atherosclerosis 06/06/2010  . GERD 06/06/2010  . History of colonic polyps 06/06/2010    Current Outpatient Medications on File Prior to Visit  Medication Sig Dispense Refill  . ALPRAZolam (XANAX) 1 MG tablet TAKE 1 TABLET BY MOUTH TWICE A DAY 60 tablet 2  . aspirin 81 MG tablet Take 81 mg by mouth daily.      [name] atorvastatin (LIPITOR) 80 MG tablet TAKE 1 TABLET BY MOUTH EVERY DAY 90 tablet 3  . fexofenadine  (ALLEGRA) 180 MG tablet Take 1 tablet (180 mg total) by mouth daily. 30 tablet 5  . fish oil-omega-3 fatty acids 1000 MG capsule Take 1 g by mouth. Takes occasionally    . fluticasone (FLONASE) 50 MCG/ACT nasal spray Place 1-2 sprays into both nostrils daily as needed for allergies or rhinitis. 48 g 3  . levalbuterol (XOPENEX HFA) 45 MCG/ACT inhaler Inhale 2 puffs every 4 (four) hours as needed into the lungs for wheezing. 1 Inhaler 12  . losartan (COZAAR) 25 MG tablet TAKE 2 TABLETS (50MG ) BY MOUTH EVERY DAY 180 tablet 1  . meloxicam (MOBIC) 7.5 MG tablet TAKE 1 TABLET BY MOUTH EVERY DAY 30 tablet 0  . metoprolol tartrate (LOPRESSOR) 50 MG tablet TAKE 1 TABLET EVERY MORNING AND 1 AND 1/2 TABLET EVERY EVENING 225 tablet 1  . Misc Natural Products (GLUCOSAMINE CHOND COMPLEX/MSM PO) Take by mouth. Occasionally    . montelukast (SINGULAIR) 10 MG tablet TAKE 1 TABLET BY MOUTH EVERYDAY AT BEDTIME 90 tablet 1  . Multiple Vitamin (MULTIVITAMIN) tablet Take 1 tablet by mouth. occasionally    . nitroGLYCERIN (NITROSTAT) 0.4 MG SL tablet PLACE 1 TABLET UNDER THE TONGUE EVERY 5 MINUTES AS NEEDED FOR CHEST PAIN. 75 tablet 1  . pantoprazole (PROTONIX) 40 MG tablet TAKE 1 TABLET BY MOUTH EVERY DAY 90 tablet 2  . triamterene-hydrochlorothiazide (MAXZIDE-25) 37.5-25 MG tablet TAKE 1 TABLET BY MOUTH EVERY DAY 90 tablet 2  . zolpidem (AMBIEN) 10 MG tablet TAKE 1 TABLET BY MOUTH EVERY DAY AT BEDTIME AS NEEDED 30 tablet 0  . [DISCONTINUED] omeprazole (PRILOSEC OTC) 20 MG tablet Take 1 tablet (20 mg total) by mouth daily. 30 tablet 1   No current facility-administered medications on file prior to visit.    Allergies  Allergen Reactions  . Ciprofloxacin   . Citalopram Hydrobromide   . Escitalopram Oxalate   . Paroxetine   . Polysporin [Bacitracin-Polymyxin B]  Objective: Physical Exam  General: Well developed, nourished, no acute distress, awake, alert and oriented x 3  Vascular: Dorsalis pedis artery  1/4 bilateral, Posterior tibial artery 1/4 bilateral, skin temperature warm to warm proximal to distal bilateral lower extremities, no varicosities, pedal hair present bilateral.  Neurological: Gross sensation present via light touch bilateral.   Dermatological: Skin is warm, dry, and supple bilateral, Nails 1-10 are tender, long, thick, and discolored with mild subungal debris, no acute ingrowing noted, no webspace macerations present bilateral, no open lesions present bilateral, no callus/corns/hyperkeratotic tissue present bilateral. No signs of infection bilateral.  Musculoskeletal: Asymptomatic pes planus and bunion boney deformities noted bilateral. Muscular strength within normal limits without painon range of motion. No pain with calf compression bilateral.  Assessment and Plan:  Problem List Items Addressed This Visit      Endocrine   Diabetes mellitus without complication ([place])    Other Visit Diagnoses    Pain due to onychomycosis of toenails of both feet    -  Primary   PVD (peripheral vascular disease) ([place])       HX: long term anticoagulant use         -Examined patient.  -Re-Discussed treatment options for painful mycotic nails like previous -Mechanically debrided and reduced mycotic nails with sterile nail nipper and dremel nail file without incident.  -Patient to return in 2.5  months for follow up evaluation or sooner if symptoms worsen.  Madeline Wood, [name]

## 2020-02-04 ENCOUNTER — Ambulatory Visit (INDEPENDENT_AMBULATORY_CARE_PROVIDER_SITE_OTHER): Payer: Medicare Other

## 2020-02-04 DIAGNOSIS — J309 Allergic rhinitis, unspecified: Secondary | ICD-10-CM | POA: Diagnosis not present

## 2020-02-16 ENCOUNTER — Other Ambulatory Visit: Payer: Self-pay

## 2020-02-16 ENCOUNTER — Encounter: Payer: Self-pay | Admitting: Gastroenterology

## 2020-02-16 ENCOUNTER — Ambulatory Visit (INDEPENDENT_AMBULATORY_CARE_PROVIDER_SITE_OTHER): Payer: Medicare Other | Admitting: Family Medicine

## 2020-02-16 ENCOUNTER — Ambulatory Visit (INDEPENDENT_AMBULATORY_CARE_PROVIDER_SITE_OTHER): Payer: Medicare Other

## 2020-02-16 VITALS — BP 120/82 | HR 65 | Temp 95.9°F | Wt 206.0 lb

## 2020-02-16 DIAGNOSIS — E782 Mixed hyperlipidemia: Secondary | ICD-10-CM

## 2020-02-16 DIAGNOSIS — I2510 Atherosclerotic heart disease of native coronary artery without angina pectoris: Secondary | ICD-10-CM

## 2020-02-16 DIAGNOSIS — Z1211 Encounter for screening for malignant neoplasm of colon: Secondary | ICD-10-CM | POA: Diagnosis not present

## 2020-02-16 DIAGNOSIS — I10 Essential (primary) hypertension: Secondary | ICD-10-CM

## 2020-02-16 DIAGNOSIS — J309 Allergic rhinitis, unspecified: Secondary | ICD-10-CM

## 2020-02-16 DIAGNOSIS — E119 Type 2 diabetes mellitus without complications: Secondary | ICD-10-CM

## 2020-02-16 MED ORDER — ALPRAZOLAM 1 MG PO TABS: 1.0000 mg | ORAL_TABLET | Freq: Two times a day (BID) | ORAL | 2 refills | Status: DC

## 2020-02-16 MED ORDER — ZOLPIDEM TARTRATE 10 MG PO TABS: 10.0000 mg | ORAL_TABLET | Freq: Every evening | ORAL | 2 refills | Status: DC | PRN

## 2020-02-16 NOTE — Progress Notes (Signed)
Subjective:    Patient ID: Madeline Wood, female    DOB: May 10, 1946, 74 y.o.   MRN: [id number]  Medication Refill     Patient is a very pleasant 74 year old African-American female who is here today for a checkup.  Past medical history significant for coronary artery disease with a stent put in her distal RCA in 2006.  She is currently on Lipitor 80 mg a day as well as aspirin.  She also has a history of diet-controlled diabetes mellitus. She is compliant taking her aspirin.  She denies any angina.  She denies any chest pain.  She denies any shortness of breath.  She denies any dyspnea on exertion.  She has had her Covid vaccination.  Her blood pressure today is well controlled at 120/82.  She also has a history of borderline diabetes mellitus.  She denies any polyuria, polydipsia, blurry vision.  She denies any neuropathy in her feet.  Her last colonoscopy was 2011.  She is due again this fall.  She would like me to schedule this for her.  Past Medical History:  Diagnosis Date  . 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 Angio-edema   . Anxiety   . CAD (coronary artery disease)    stent of distal RCA in 2006  . Diabetes mellitus without complication ([place])   . Diverticulosis   . GERD (gastroesophageal reflux disease)   . Gout   . Hiatal hernia   . History of nuclear stress test 03/2011   negative lexiscan myoview; normal perfusion  . Hyperlipidemia   . Hypertension   . Knee pain   . Obesity   . Positive TB test   . Venous insufficiency    Past Surgical History:  Procedure Laterality Date  . ABDOMINAL HYSTERECTOMY    . BACK SURGERY  2012  . BREAST LUMPECTOMY    . CORONARY ANGIOPLASTY WITH STENT PLACEMENT  06/29/2005   Taxus 2.5x28mm DES to distal RCA (Dr. Tami Ribas)  . TRANSTHORACIC ECHOCARDIOGRAM  12/20/2012   EF 20-25%, normal systolic function, mild hypokinesis of inf myocardium; calcified MV annulua, LA & RA mildly dilated   Current Outpatient Medications on File Prior to Visit   Medication Sig Dispense Refill  . ALPRAZolam (XANAX) 1 MG tablet TAKE 1 TABLET BY MOUTH TWICE A DAY 60 tablet 2  . aspirin 81 MG tablet Take 81 mg by mouth daily.      [name] atorvastatin (LIPITOR) 80 MG tablet TAKE 1 TABLET BY MOUTH EVERY DAY 90 tablet 3  . fexofenadine (ALLEGRA) 180 MG tablet Take 1 tablet (180 mg total) by mouth daily. 30 tablet 5  . fish oil-omega-3 fatty acids 1000 MG capsule Take 1 g by mouth. Takes occasionally    . fluticasone (FLONASE) 50 MCG/ACT nasal spray Place 1-2 sprays into both nostrils daily as needed for allergies or rhinitis. 48 g 3  . levalbuterol (XOPENEX HFA) 45 MCG/ACT inhaler Inhale 2 puffs every 4 (four) hours as needed into the lungs for wheezing. 1 Inhaler 12  . losartan (COZAAR) 25 MG tablet TAKE 2 TABLETS (50MG ) BY MOUTH EVERY DAY 180 tablet 1  . meloxicam (MOBIC) 7.5 MG tablet TAKE 1 TABLET BY MOUTH EVERY DAY 30 tablet 0  . metoprolol tartrate (LOPRESSOR) 50 MG tablet TAKE 1 TABLET EVERY MORNING AND 1 AND 1/2 TABLET EVERY EVENING 225 tablet 1  . Misc Natural Products (GLUCOSAMINE CHOND COMPLEX/MSM PO) Take by mouth. Occasionally    . montelukast (SINGULAIR) 10 MG tablet TAKE 1 TABLET BY  MOUTH EVERYDAY AT BEDTIME 90 tablet 1  . Multiple Vitamin (MULTIVITAMIN) tablet Take 1 tablet by mouth. occasionally    . nitroGLYCERIN (NITROSTAT) 0.4 MG SL tablet PLACE 1 TABLET UNDER THE TONGUE EVERY 5 MINUTES AS NEEDED FOR CHEST PAIN. 75 tablet 1  . pantoprazole (PROTONIX) 40 MG tablet TAKE 1 TABLET BY MOUTH EVERY DAY 90 tablet 2  . triamterene-hydrochlorothiazide (MAXZIDE-25) 37.5-25 MG tablet TAKE 1 TABLET BY MOUTH EVERY DAY 90 tablet 2  . zolpidem (AMBIEN) 10 MG tablet TAKE 1 TABLET BY MOUTH EVERY DAY AT BEDTIME AS NEEDED 30 tablet 0  . [DISCONTINUED] omeprazole (PRILOSEC OTC) 20 MG tablet Take 1 tablet (20 mg total) by mouth daily. 30 tablet 1   No current facility-administered medications on file prior to visit.   Allergies  Allergen Reactions  .  Ciprofloxacin   . Citalopram Hydrobromide   . Escitalopram Oxalate   . Paroxetine   . Polysporin [Bacitracin-Polymyxin B]    Social History   Socioeconomic History  . Marital status: Single    Spouse name: Not on file  . Number of children: 1  . Years of education: Not on file  . Highest education level: Not on file  Occupational History  . Occupation: Biomedical engineer  Tobacco Use  . Smoking status: Former Smoker    Quit date: 12/20/2001    Years since quitting: 18.1  . Smokeless tobacco: Never Used  Vaping Use  . Vaping Use: Never used  Substance and Sexual Activity  . Alcohol use: No    Alcohol/week: 0.0 standard drinks  . Drug use: No  . Sexual activity: Not on file  Other Topics Concern  . Not on file  Social History Narrative  . Not on file   Social Determinants of Health   Financial Resource Strain:   . Difficulty of Paying Living Expenses:   Food Insecurity:   . Worried About Charity fundraiser in the Last Year:   . Arboriculturist in the Last Year:   Transportation Needs:   . Film/video editor (Medical):   [name] Lack of Transportation (Non-Medical):   Physical Activity:   . Days of Exercise per Week:   . Minutes of Exercise per Session:   Stress:   . Feeling of Stress :   Social Connections:   . Frequency of Communication with Friends and Family:   . Frequency of Social Gatherings with Friends and Family:   . Attends Religious Services:   . Active Member of Clubs or Organizations:   . Attends Archivist Meetings:   [name] Marital Status:   Intimate Partner Violence:   . Fear of Current or Ex-Partner:   . Emotionally Abused:   [name] Physically Abused:   . Sexually Abused:      Review of Systems  All other systems reviewed and are negative.      Objective:   Physical Exam Constitutional:      General: She is not in acute distress.    Appearance: Normal appearance. She is not ill-appearing, toxic-appearing or diaphoretic.  Cardiovascular:      Rate and Rhythm: Normal rate and regular rhythm.     Pulses: Normal pulses.     Heart sounds: Normal heart sounds. No murmur heard.  No friction rub. No gallop.   Pulmonary:     Effort: Pulmonary effort is normal. No respiratory distress.     Breath sounds: Normal breath sounds. No stridor. No wheezing, rhonchi or rales.  Abdominal:  General: Bowel sounds are normal. There is no distension.     Palpations: Abdomen is soft.     Tenderness: There is no abdominal tenderness. There is no rebound.  Musculoskeletal:     Right lower leg: No edema.     Left lower leg: No edema.  Neurological:     Mental Status: She is alert.           Assessment & Plan:  Atherosclerosis of native coronary artery of native heart without angina pectoris  Diabetes mellitus without complication ([place]) - Plan: CBC with Differential/Platelet, COMPLETE METABOLIC PANEL WITH GFR, Lipid panel, Hemoglobin A1c  Colon cancer screening - Plan: Ambulatory referral to Gastroenterology  Essential hypertension  Mixed hyperlipidemia   Patient's blood pressure today is well controlled at 120/82.  I will check a fasting lipid panel.  Given her history of coronary artery disease, her goal LDL cholesterol is less than 70.  She also has a history of diabetes mellitus type 2.  She denies any symptoms of uncontrolled blood sugar such as polyuria, polydipsia, or blurry vision.  Diabetic foot exam was performed today and is normal.  Therefore I will check a hemoglobin A1c.  Goal hemoglobin A1c is less than 6.5.  Patient has had a Covid shot per her report.  Diabetic foot exam was performed today and is normal.

## 2020-02-17 LAB — COMPLETE METABOLIC PANEL WITH GFR
AG Ratio: 1.5 (calc) (ref 1.0–2.5)
ALT: 12 U/L (ref 6–29)
AST: 27 U/L (ref 10–35)
Albumin: 4 g/dL (ref 3.6–5.1)
Alkaline phosphatase (APISO): 38 U/L (ref 37–153)
BUN: 17 mg/dL (ref 7–25)
CO2: 26 mmol/L (ref 20–32)
Calcium: 9.9 mg/dL (ref 8.6–10.4)
Chloride: 93 mmol/L — ABNORMAL LOW (ref 98–110)
Creat: 0.79 mg/dL (ref 0.60–0.93)
GFR, Est African American: 85 mL/min/{1.73_m2} (ref >=60)
GFR, Est Non African American: 74 mL/min/{1.73_m2} (ref >=60)
Globulin: 2.7 g/dL (calc) (ref 1.9–3.7)
Glucose, Bld: 100 mg/dL — ABNORMAL HIGH (ref 65–99)
Potassium: 4 mmol/L (ref 3.5–5.3)
Sodium: 130 mmol/L — ABNORMAL LOW (ref 135–146)
Total Bilirubin: 0.7 mg/dL (ref 0.2–1.2)
Total Protein: 6.7 g/dL (ref 6.1–8.1)

## 2020-02-17 LAB — LIPID PANEL
Cholesterol: 150 mg/dL (ref <200)
HDL: 62 mg/dL (ref >=50)
LDL Cholesterol (Calc): 75 mg/dL (calc)
Non-HDL Cholesterol (Calc): 88 mg/dL (calc) (ref <130)
Total CHOL/HDL Ratio: 2.4 (calc) (ref <5.0)
Triglycerides: 53 mg/dL (ref <150)

## 2020-02-17 LAB — CBC WITH DIFFERENTIAL/PLATELET
Absolute Monocytes: 711 cells/uL (ref 200–950)
Basophils Absolute: 39 cells/uL (ref 0–200)
Basophils Relative: 0.8 %
Eosinophils Absolute: 98 cells/uL (ref 15–500)
Eosinophils Relative: 2 %
HCT: 38.6 % (ref 35.0–45.0)
Hemoglobin: 12.6 g/dL (ref 11.7–15.5)
Lymphs Abs: 1960 cells/uL (ref 850–3900)
MCH: 29.3 pg (ref 27.0–33.0)
MCHC: 32.6 g/dL (ref 32.0–36.0)
MCV: 89.8 fL (ref 80.0–100.0)
MPV: 9.1 fL (ref 7.5–12.5)
Monocytes Relative: 14.5 %
Neutro Abs: 2092 cells/uL (ref 1500–7800)
Neutrophils Relative %: 42.7 %
Platelets: 236 10*3/uL (ref 140–400)
RBC: 4.3 10*6/uL (ref 3.80–5.10)
RDW: 12.5 % (ref 11.0–15.0)
Total Lymphocyte: 40 %
WBC: 4.9 10*3/uL (ref 3.8–10.8)

## 2020-02-17 LAB — HEMOGLOBIN A1C
Hgb A1c MFr Bld: 5.9 % of total Hgb — ABNORMAL HIGH (ref <5.7)
Mean Plasma Glucose: 123 (calc)
eAG (mmol/L): 6.8 (calc)

## 2020-03-02 ENCOUNTER — Ambulatory Visit (INDEPENDENT_AMBULATORY_CARE_PROVIDER_SITE_OTHER): Payer: Medicare Other

## 2020-03-02 DIAGNOSIS — J309 Allergic rhinitis, unspecified: Secondary | ICD-10-CM

## 2020-03-09 ENCOUNTER — Ambulatory Visit (INDEPENDENT_AMBULATORY_CARE_PROVIDER_SITE_OTHER): Payer: Medicare Other

## 2020-03-09 DIAGNOSIS — J309 Allergic rhinitis, unspecified: Secondary | ICD-10-CM | POA: Diagnosis not present

## 2020-03-10 DIAGNOSIS — J3089 Other allergic rhinitis: Secondary | ICD-10-CM

## 2020-03-10 NOTE — Progress Notes (Signed)
VIALS EXP 03-10-21

## 2020-03-11 DIAGNOSIS — J3081 Allergic rhinitis due to animal (cat) (dog) hair and dander: Secondary | ICD-10-CM

## 2020-03-22 ENCOUNTER — Ambulatory Visit (INDEPENDENT_AMBULATORY_CARE_PROVIDER_SITE_OTHER): Payer: Medicare Other

## 2020-03-22 DIAGNOSIS — J309 Allergic rhinitis, unspecified: Secondary | ICD-10-CM | POA: Diagnosis not present

## 2020-03-26 ENCOUNTER — Other Ambulatory Visit: Payer: Self-pay

## 2020-03-26 ENCOUNTER — Ambulatory Visit (AMBULATORY_SURGERY_CENTER): Payer: Self-pay | Admitting: *Deleted

## 2020-03-26 VITALS — Ht 66.0 in | Wt 204.0 lb

## 2020-03-26 DIAGNOSIS — Z1211 Encounter for screening for malignant neoplasm of colon: Secondary | ICD-10-CM

## 2020-03-26 MED ORDER — SUPREP BOWEL PREP KIT 17.5-3.13-1.6 GM/177ML PO SOLN: 1.0000 | Freq: Once | ORAL | 0 refills | Status: AC

## 2020-03-26 NOTE — Progress Notes (Signed)
cov vacc x 2   No egg or soy allergy known to patient  No issues with past sedation with any surgeries or procedures no intubation problems in the past  No diet pills per patient No home 02 use per patient  No blood thinners per patient  Pt denies issues with constipation  No A fib or A flutter  EMMI video to pt or MyChart  COVID 19 guidelines implemented in [facility] today      Due to the COVID-19 pandemic we are asking patients to follow these guidelines. Please only bring one care partner. Please be aware that your care partner may wait in the car in the parking lot or if they feel like they will be too hot to wait in the car, they may wait in the lobby on the 4th floor. All care partners are required to wear a mask the entire time (we do not have any that we can provide them), they need to practice social distancing, and we will do a Covid check for all patient's and care partners when you arrive. Also we will check their temperature and your temperature. If the care partner waits in their car they need to stay in the parking lot the entire time and we will call them on their cell phone when the patient is ready for discharge so they can bring the car to the front of the building. Also all patient's will need to wear a mask into building.

## 2020-03-31 ENCOUNTER — Other Ambulatory Visit: Payer: Self-pay

## 2020-03-31 ENCOUNTER — Other Ambulatory Visit: Payer: Self-pay | Admitting: Internal Medicine

## 2020-03-31 MED ORDER — LEVALBUTEROL TARTRATE 45 MCG/ACT IN AERO: 2.0000 | INHALATION_SPRAY | RESPIRATORY_TRACT | 12 refills | Status: DC | PRN

## 2020-04-01 ENCOUNTER — Telehealth: Payer: Self-pay | Admitting: *Deleted

## 2020-04-01 NOTE — Telephone Encounter (Signed)
Received fax requesting alternative to Xopenex.   Insurance prefers [employer].   MD please advise.

## 2020-04-02 ENCOUNTER — Ambulatory Visit (INDEPENDENT_AMBULATORY_CARE_PROVIDER_SITE_OTHER): Payer: Medicare Other

## 2020-04-02 DIAGNOSIS — J309 Allergic rhinitis, unspecified: Secondary | ICD-10-CM | POA: Diagnosis not present

## 2020-04-02 MED ORDER — ALBUTEROL SULFATE HFA 108 (90 BASE) MCG/ACT IN AERS
2.0000 | INHALATION_SPRAY | Freq: Four times a day (QID) | RESPIRATORY_TRACT | 11 refills | Status: DC | PRN
Start: 2020-04-02 — End: 2020-11-12

## 2020-04-02 NOTE — Telephone Encounter (Signed)
Switch xopenex to proair 2 puff inh q 6 hrs prn.

## 2020-04-02 NOTE — Telephone Encounter (Signed)
Call placed to patient and patient made aware.   Prescription sent to pharmacy.  

## 2020-04-06 ENCOUNTER — Other Ambulatory Visit: Payer: Self-pay | Admitting: Family Medicine

## 2020-04-06 ENCOUNTER — Other Ambulatory Visit: Payer: Self-pay | Admitting: Internal Medicine

## 2020-04-06 DIAGNOSIS — K219 Gastro-esophageal reflux disease without esophagitis: Secondary | ICD-10-CM

## 2020-04-09 ENCOUNTER — Telehealth: Payer: Self-pay

## 2020-04-09 NOTE — Telephone Encounter (Signed)
Pt called in concern of her BP, she does take her BP medicine everyday, she doesn't know if it was something she ate or what.  BP Readings :  167/85 174/84 152/76

## 2020-04-12 NOTE — Telephone Encounter (Signed)
Those are high, I would increase losartan to 100 mg a day and recheck in 2 weeks.

## 2020-04-13 ENCOUNTER — Other Ambulatory Visit: Payer: Self-pay

## 2020-04-13 ENCOUNTER — Ambulatory Visit (INDEPENDENT_AMBULATORY_CARE_PROVIDER_SITE_OTHER): Payer: Medicare Other | Admitting: Family Medicine

## 2020-04-13 VITALS — BP 110/70 | HR 72 | Temp 96.6°F | Ht 66.0 in | Wt 204.0 lb

## 2020-04-13 DIAGNOSIS — I10 Essential (primary) hypertension: Secondary | ICD-10-CM

## 2020-04-13 NOTE — Progress Notes (Signed)
Subjective:    Patient ID: Madeline Wood, female    DOB: Dec 26, 1945, 74 y.o.   MRN: [id number]  HPI Patient called Friday with elevated blood pressures in the 917-915 range systolic.  I recommended that she increase her losartan to 100 mg.  However my nurse had not yet been able to reach the patient so the patient had not yet made a change in her medication.  Instead she made an appointment today to come in to discuss it.  Her blood pressure here is between 110 and 120/70-76.  Both my nurse and myself confirm this.  Patient states that when she checked her blood pressure Friday, she was very nervous and upset because she felt lightheaded.  Apparently last week she felt tired and lethargic.  She felt congested.  She attributes this to an upper respiratory infection.  However her symptoms have completely subsided.  Today she states she feels fine.  She denies any chest pain, shortness of breath, dyspnea on exertion.  She denies any nausea vomiting or diarrhea.  She denies any dysuria or urgency or frequency.  She denies any head congestion or rhinorrhea.  Overall, she is back to her baseline. Past Medical History:  Diagnosis Date  . Allergy    seasonal/ environmental/ animals gets allergy injections   . 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 Angio-edema   . Anxiety   . Arthritis   . Blood transfusion without reported diagnosis    1985 with hysterectomy   . CAD (coronary artery disease)    stent of distal RCA in 2006  . Diabetes mellitus without complication ([place])    diet controlled-   . Diverticulosis   . GERD (gastroesophageal reflux disease)   . Gout   . H/O heart artery stent   . Hiatal hernia   . History of nuclear stress test 03/2011   negative lexiscan myoview; normal perfusion  . Hyperlipidemia   . Hypertension   . Knee pain   . Neuromuscular disorder ([place])    Hiatal Hernia   . Obesity   . Positive TB test   . Venous insufficiency    Past Surgical History:  Procedure Laterality Date   . ABDOMINAL HYSTERECTOMY    . BACK SURGERY  2012  . BREAST LUMPECTOMY    . COLONOSCOPY  2011  . CORONARY ANGIOPLASTY WITH STENT PLACEMENT  06/29/2005   Taxus 2.5x38mm DES to distal RCA (Dr. Tami Ribas)  . TRANSTHORACIC ECHOCARDIOGRAM  12/20/2012   EF 05-69%, normal systolic function, mild hypokinesis of inf myocardium; calcified MV annulua, LA & RA mildly dilated   Current Outpatient Medications on File Prior to Visit  Medication Sig Dispense Refill  . albuterol (VENTOLIN HFA) 108 (90 Base) MCG/ACT inhaler Inhale 2 puffs into the lungs every 6 (six) hours as needed for wheezing or shortness of breath. 18 g 11  . ALPRAZolam (XANAX) 1 MG tablet Take 1 tablet (1 mg total) by mouth 2 (two) times daily. 60 tablet 2  . aspirin 81 MG tablet Take 81 mg by mouth daily.      [name] atorvastatin (LIPITOR) 80 MG tablet TAKE 1 TABLET BY MOUTH EVERY DAY 90 tablet 3  . fexofenadine (ALLEGRA) 180 MG tablet Take 1 tablet (180 mg total) by mouth daily. 30 tablet 5  . fish oil-omega-3 fatty acids 1000 MG capsule Take 1 g by mouth. Takes occasionally    . fluticasone (FLONASE) 50 MCG/ACT nasal spray Place 1-2 sprays into both nostrils daily as needed for allergies  or rhinitis. 48 g 3  . losartan (COZAAR) 25 MG tablet TAKE 2 TABLETS (50MG ) BY MOUTH EVERY DAY 180 tablet 3  . metoprolol tartrate (LOPRESSOR) 50 MG tablet TAKE 1 TABLET EVERY MORNING AND 1 AND 1/2 TABLET EVERY EVENING 225 tablet 0  . Misc Natural Products (GLUCOSAMINE CHOND COMPLEX/MSM PO) Take by mouth. Occasionally    . montelukast (SINGULAIR) 10 MG tablet TAKE 1 TABLET BY MOUTH EVERYDAY AT BEDTIME 90 tablet 1  . Multiple Vitamin (MULTIVITAMIN) tablet Take 1 tablet by mouth. occasionally    . nitroGLYCERIN (NITROSTAT) 0.4 MG SL tablet PLACE 1 TABLET UNDER THE TONGUE EVERY 5 MINUTES AS NEEDED FOR CHEST PAIN. 75 tablet 1  . pantoprazole (PROTONIX) 40 MG tablet TAKE 1 TABLET BY MOUTH EVERY DAY 90 tablet 1  . triamterene-hydrochlorothiazide (MAXZIDE-25)  37.5-25 MG tablet TAKE 1 TABLET BY MOUTH EVERY DAY 90 tablet 2  . Wheat Dextrin (BENEFIBER PO) Take by mouth. Daily    . zolpidem (AMBIEN) 10 MG tablet Take 1 tablet (10 mg total) by mouth at bedtime as needed for sleep. 30 tablet 2  . [DISCONTINUED] omeprazole (PRILOSEC OTC) 20 MG tablet Take 1 tablet (20 mg total) by mouth daily. 30 tablet 1   No current facility-administered medications on file prior to visit.   Allergies  Allergen Reactions  . Ciprofloxacin   . Citalopram Hydrobromide   . Escitalopram Oxalate   . Paroxetine   . Polysporin [Bacitracin-Polymyxin B]    Social History   Socioeconomic History  . Marital status: Single    Spouse name: Not on file  . Number of children: 1  . Years of education: Not on file  . Highest education level: Not on file  Occupational History  . Occupation: Biomedical engineer  Tobacco Use  . Smoking status: Former Smoker    Quit date: 12/20/2001    Years since quitting: 18.3  . Smokeless tobacco: Never Used  Vaping Use  . Vaping Use: Never used  Substance and Sexual Activity  . Alcohol use: No    Alcohol/week: 0.0 standard drinks  . Drug use: No  . Sexual activity: Not on file  Other Topics Concern  . Not on file  Social History Narrative  . Not on file   Social Determinants of Health   Financial Resource Strain:   . Difficulty of Paying Living Expenses:   Food Insecurity:   . Worried About Charity fundraiser in the Last Year:   . Arboriculturist in the Last Year:   Transportation Needs:   . Film/video editor (Medical):   [name] Lack of Transportation (Non-Medical):   Physical Activity:   . Days of Exercise per Week:   . Minutes of Exercise per Session:   Stress:   . Feeling of Stress :   Social Connections:   . Frequency of Communication with Friends and Family:   . Frequency of Social Gatherings with Friends and Family:   . Attends Religious Services:   . Active Member of Clubs or Organizations:   . Attends Theatre manager Meetings:   [name] Marital Status:   Intimate Partner Violence:   . Fear of Current or Ex-Partner:   . Emotionally Abused:   [name] Physically Abused:   . Sexually Abused:       Review of Systems  All other systems reviewed and are negative.      Objective:   Physical Exam Vitals reviewed.  Constitutional:      General:  She is not in acute distress.    Appearance: Normal appearance. She is normal weight. She is not ill-appearing or toxic-appearing.  HENT:     Nose: Nose normal. No congestion or rhinorrhea.  Neck:     Vascular: No carotid bruit.  Cardiovascular:     Rate and Rhythm: Normal rate and regular rhythm.     Pulses: Normal pulses.     Heart sounds: Normal heart sounds. No murmur heard.  No gallop.   Pulmonary:     Effort: No respiratory distress.     Breath sounds: Normal breath sounds. No stridor. No wheezing, rhonchi or rales.  Abdominal:     General: Abdomen is flat. Bowel sounds are normal. There is no distension.     Palpations: Abdomen is soft.     Tenderness: There is no abdominal tenderness.  Musculoskeletal:     Cervical back: Neck supple.     Right lower leg: No edema.     Left lower leg: No edema.  Lymphadenopathy:     Cervical: No cervical adenopathy.  Neurological:     Mental Status: She is alert.           Assessment & Plan:  Essential hypertension  Blood pressure today is excellent.  I believe her blood pressure was elevated last week because she was anxious.  Now that she has relax her blood pressure is back to normal.  No further treatment is necessary at this time.  I advised the patient not to increase her medication as I had recommended last week.  Instead we will clinically monitor the situation.

## 2020-04-14 ENCOUNTER — Emergency Department (HOSPITAL_COMMUNITY)
Admission: EM | Admit: 2020-04-14 | Discharge: 2020-04-14 | Disposition: A | Discharge status: Home / Self Care | Payer: Medicare Other | Attending: Emergency Medicine | Admitting: Emergency Medicine

## 2020-04-14 ENCOUNTER — Encounter (HOSPITAL_COMMUNITY): Payer: Self-pay

## 2020-04-14 ENCOUNTER — Emergency Department (HOSPITAL_COMMUNITY): Payer: Medicare Other

## 2020-04-14 ENCOUNTER — Other Ambulatory Visit: Payer: Self-pay

## 2020-04-14 DIAGNOSIS — Z7982 Long term (current) use of aspirin: Secondary | ICD-10-CM | POA: Diagnosis not present

## 2020-04-14 DIAGNOSIS — R079 Chest pain, unspecified: Secondary | ICD-10-CM | POA: Diagnosis not present

## 2020-04-14 DIAGNOSIS — Y939 Activity, unspecified: Secondary | ICD-10-CM | POA: Diagnosis not present

## 2020-04-14 DIAGNOSIS — S199XXA Unspecified injury of neck, initial encounter: Secondary | ICD-10-CM | POA: Diagnosis not present

## 2020-04-14 DIAGNOSIS — T1490XA Injury, unspecified, initial encounter: Secondary | ICD-10-CM | POA: Diagnosis not present

## 2020-04-14 DIAGNOSIS — Z79899 Other long term (current) drug therapy: Secondary | ICD-10-CM | POA: Insufficient documentation

## 2020-04-14 DIAGNOSIS — Z87891 Personal history of nicotine dependence: Secondary | ICD-10-CM | POA: Insufficient documentation

## 2020-04-14 DIAGNOSIS — E119 Type 2 diabetes mellitus without complications: Secondary | ICD-10-CM | POA: Insufficient documentation

## 2020-04-14 DIAGNOSIS — R0789 Other chest pain: Secondary | ICD-10-CM | POA: Insufficient documentation

## 2020-04-14 DIAGNOSIS — Y929 Unspecified place or not applicable: Secondary | ICD-10-CM | POA: Diagnosis not present

## 2020-04-14 DIAGNOSIS — I10 Essential (primary) hypertension: Secondary | ICD-10-CM | POA: Insufficient documentation

## 2020-04-14 DIAGNOSIS — I2510 Atherosclerotic heart disease of native coronary artery without angina pectoris: Secondary | ICD-10-CM | POA: Diagnosis not present

## 2020-04-14 DIAGNOSIS — S3992XA Unspecified injury of lower back, initial encounter: Secondary | ICD-10-CM | POA: Diagnosis not present

## 2020-04-14 DIAGNOSIS — M545 Low back pain: Secondary | ICD-10-CM | POA: Diagnosis not present

## 2020-04-14 DIAGNOSIS — Y999 Unspecified external cause status: Secondary | ICD-10-CM | POA: Insufficient documentation

## 2020-04-14 DIAGNOSIS — R21 Rash and other nonspecific skin eruption: Secondary | ICD-10-CM | POA: Diagnosis not present

## 2020-04-14 DIAGNOSIS — M542 Cervicalgia: Secondary | ICD-10-CM | POA: Diagnosis not present

## 2020-04-14 MED ORDER — ACETAMINOPHEN 325 MG PO TABS
650.0000 mg | ORAL_TABLET | Freq: Once | ORAL | Status: AC
  Administered 2020-04-14: 650 mg via ORAL
  Filled 2020-04-14: qty 2

## 2020-04-14 MED ORDER — CYCLOBENZAPRINE HCL 10 MG PO TABS
5.0000 mg | ORAL_TABLET | Freq: Once | ORAL | Status: AC
  Administered 2020-04-14: 5 mg via ORAL
  Filled 2020-04-14: qty 1

## 2020-04-14 MED ORDER — CYCLOBENZAPRINE HCL 5 MG PO TABS: 5.0000 mg | ORAL_TABLET | Freq: Three times a day (TID) | ORAL | 0 refills | Status: DC | PRN

## 2020-04-14 MED ORDER — LOSARTAN POTASSIUM 100 MG PO TABS: 100.0000 mg | ORAL_TABLET | Freq: Every day | ORAL | 3 refills | Status: DC

## 2020-04-14 MED ORDER — ALPRAZOLAM 0.25 MG PO TABS
0.2500 mg | ORAL_TABLET | Freq: Once | ORAL | Status: AC
  Administered 2020-04-14: 0.25 mg via ORAL
  Filled 2020-04-14: qty 1

## 2020-04-14 NOTE — Discharge Instructions (Signed)
Expect to be stiff and sore for several days.  Take Tylenol and Motrin for pain.  Take Flexeril for muscle spasms.  See your doctor for follow-up . Return to ER if you have worse back pain, neck pain, headaches.

## 2020-04-14 NOTE — Telephone Encounter (Signed)
Rx was sent in for Pt to Pharmacy
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
## 2020-04-15 ENCOUNTER — Telehealth: Payer: Self-pay | Admitting: Gastroenterology

## 2020-04-15 NOTE — Telephone Encounter (Signed)
Please reschedule her colonoscopy without another previsit.
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
## 2020-04-15 NOTE — Telephone Encounter (Signed)
Colon r/s to 10/6 at 2:00pm. At [facility].

## 2020-04-16 ENCOUNTER — Encounter: Payer: Medicare Other | Admitting: Gastroenterology

## 2020-04-19 ENCOUNTER — Ambulatory Visit (INDEPENDENT_AMBULATORY_CARE_PROVIDER_SITE_OTHER): Payer: Medicare Other

## 2020-04-19 DIAGNOSIS — J309 Allergic rhinitis, unspecified: Secondary | ICD-10-CM

## 2020-04-20 ENCOUNTER — Ambulatory Visit (INDEPENDENT_AMBULATORY_CARE_PROVIDER_SITE_OTHER): Payer: Medicare Other | Admitting: Family Medicine

## 2020-04-20 ENCOUNTER — Other Ambulatory Visit: Payer: Self-pay

## 2020-04-20 VITALS — BP 140/80 | HR 67 | Temp 97.3°F | Ht 66.0 in | Wt 204.0 lb

## 2020-04-20 DIAGNOSIS — S134XXA Sprain of ligaments of cervical spine, initial encounter: Secondary | ICD-10-CM

## 2020-04-20 MED ORDER — MELOXICAM 7.5 MG PO TABS: 7.5000 mg | ORAL_TABLET | Freq: Every day | ORAL | 0 refills | Status: DC

## 2020-04-20 MED ORDER — CYCLOBENZAPRINE HCL 5 MG PO TABS: 5.0000 mg | ORAL_TABLET | Freq: Three times a day (TID) | ORAL | 0 refills | Status: DC | PRN
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
## 2020-04-21 ENCOUNTER — Other Ambulatory Visit: Payer: Self-pay | Admitting: Internal Medicine

## 2020-04-25 ENCOUNTER — Other Ambulatory Visit: Payer: Self-pay | Admitting: Family Medicine

## 2020-04-28 ENCOUNTER — Telehealth: Payer: Self-pay | Admitting: Family Medicine

## 2020-04-28 NOTE — Progress Notes (Signed)
°  Chronic Care Management   Outreach Note  04/28/2020 Name: Madeline Wood MRN: [id number] DOB: November 02, 1945  Referred by: [name], MD Reason for referral : No chief complaint on file.   An unsuccessful telephone outreach was attempted today. The patient was referred to the pharmacist for assistance with care management and care coordination.   Follow Up Plan:   Carley Perdue [employer] Scheduler

## 2020-04-30 ENCOUNTER — Ambulatory Visit (INDEPENDENT_AMBULATORY_CARE_PROVIDER_SITE_OTHER): Payer: Medicare Other

## 2020-04-30 DIAGNOSIS — J309 Allergic rhinitis, unspecified: Secondary | ICD-10-CM

## 2020-05-06 ENCOUNTER — Ambulatory Visit (INDEPENDENT_AMBULATORY_CARE_PROVIDER_SITE_OTHER): Payer: Medicare Other | Admitting: Sports Medicine

## 2020-05-06 ENCOUNTER — Encounter: Payer: Self-pay | Admitting: Sports Medicine

## 2020-05-06 ENCOUNTER — Ambulatory Visit (INDEPENDENT_AMBULATORY_CARE_PROVIDER_SITE_OTHER): Payer: Medicare Other

## 2020-05-06 ENCOUNTER — Telehealth: Payer: Self-pay | Admitting: Family Medicine

## 2020-05-06 ENCOUNTER — Other Ambulatory Visit: Payer: Self-pay

## 2020-05-06 DIAGNOSIS — M79675 Pain in left toe(s): Secondary | ICD-10-CM | POA: Diagnosis not present

## 2020-05-06 DIAGNOSIS — I739 Peripheral vascular disease, unspecified: Secondary | ICD-10-CM

## 2020-05-06 DIAGNOSIS — M79674 Pain in right toe(s): Secondary | ICD-10-CM

## 2020-05-06 DIAGNOSIS — J309 Allergic rhinitis, unspecified: Secondary | ICD-10-CM | POA: Diagnosis not present

## 2020-05-06 DIAGNOSIS — E119 Type 2 diabetes mellitus without complications: Secondary | ICD-10-CM

## 2020-05-06 DIAGNOSIS — B351 Tinea unguium: Secondary | ICD-10-CM | POA: Diagnosis not present

## 2020-05-06 NOTE — Progress Notes (Signed)
  Chronic Care Management   Note  05/06/2020 Name: [name] MRN: [id number] DOB: 1946/02/24  Madeline Wood is a 74 y.o. year old female who is a primary care patient of Dennard Schaumann, Cammie Mcgee, MD. I reached out to [name] by phone today in response to a referral sent by Madeline Wood's PCP, [name], MD.   Madeline Wood was given information about Chronic Care Management services today including:  1. CCM service includes personalized support from designated clinical staff supervised by her physician, including individualized plan of care and coordination with other care providers 2. 24/7 contact phone numbers for assistance for urgent and routine care needs. 3. Service will only be billed when office clinical staff spend 20 minutes or more in a month to coordinate care. 4. Only one practitioner may furnish and bill the service in a calendar month. 5. The patient may stop CCM services at any time (effective at the end of the month) by phone call to the office staff.   Patient agreed to services and verbal consent obtained.   Follow up plan:   Carley Perdue [employer] Scheduler

## 2020-05-06 NOTE — Progress Notes (Signed)
Subjective: Madeline Wood is a 74 y.o. female patient seen today in office with complaint of mildly painful thickened and elongated toenails; unable to trim.  Last A1c not recorded and does not check her blood sugars currently monitors like before with a last visit to PCP AC 1C recorded at 5.6.  Denies any other pedal complaints at this time.  Patient Active Problem List   Diagnosis Date Noted  . Allergic reaction 09/29/2019  . Allergic rhinitis 09/13/2018  . Bilateral high frequency sensorineural hearing loss 01/17/2018  . Subjective tinnitus of left ear 01/17/2018  . Atherosclerosis of native coronary artery of native heart without angina pectoris 07/23/2017  . History of food allergy 01/05/2016  . Angioedema 11/17/2015  . Allergic rhinoconjunctivitis, seasonal and perennial 05/04/2015  . Insomnia 08/24/2014  . Bilateral leg edema 04/17/2014  . Diabetes mellitus without complication ([place])   . Essential hypertension   . Gout   . Mixed hyperlipidemia   . 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 Anxiety   . Obesity   . Knee pain   . Positive TB test   . Hiatal hernia   . GERD (gastroesophageal reflux disease)   . Coronary atherosclerosis 06/06/2010  . GERD 06/06/2010  . History of colonic polyps 06/06/2010    Current Outpatient Medications on File Prior to Visit  Medication Sig Dispense Refill  . albuterol (VENTOLIN HFA) 108 (90 Base) MCG/ACT inhaler Inhale 2 puffs into the lungs every 6 (six) hours as needed for wheezing or shortness of breath. 18 g 11  . ALPRAZolam (XANAX) 1 MG tablet Take 1 tablet (1 mg total) by mouth 2 (two) times daily. 60 tablet 2  . aspirin 81 MG tablet Take 81 mg by mouth daily.      [name] atorvastatin (LIPITOR) 80 MG tablet TAKE 1 TABLET BY MOUTH EVERY DAY 90 tablet 1  . cyclobenzaprine (FLEXERIL) 5 MG tablet Take 1 tablet (5 mg total) by mouth 3 (three) times daily as needed for muscle spasms. 20 tablet 0  . fexofenadine (ALLEGRA) 180 MG tablet Take 1 tablet (180 mg  total) by mouth daily. 30 tablet 5  . fish oil-omega-3 fatty acids 1000 MG capsule Take 1 g by mouth. Takes occasionally    . fluticasone (FLONASE) 50 MCG/ACT nasal spray Place 1-2 sprays into both nostrils daily as needed for allergies or rhinitis. 48 g 3  . losartan (COZAAR) 100 MG tablet Take 1 tablet (100 mg total) by mouth daily. 90 tablet 3  . meloxicam (MOBIC) 7.5 MG tablet TAKE 1 TABLET BY MOUTH EVERY DAY 30 tablet 3  . metoprolol tartrate (LOPRESSOR) 50 MG tablet TAKE 1 TABLET EVERY MORNING AND 1 AND 1/2 TABLET EVERY EVENING 225 tablet 0  . Misc Natural Products (GLUCOSAMINE CHOND COMPLEX/MSM PO) Take by mouth. Occasionally    . montelukast (SINGULAIR) 10 MG tablet TAKE 1 TABLET BY MOUTH EVERYDAY AT BEDTIME 90 tablet 1  . Multiple Vitamin (MULTIVITAMIN) tablet Take 1 tablet by mouth. occasionally    . nitroGLYCERIN (NITROSTAT) 0.4 MG SL tablet PLACE 1 TABLET UNDER THE TONGUE EVERY 5 MINUTES AS NEEDED FOR CHEST PAIN. 75 tablet 1  . pantoprazole (PROTONIX) 40 MG tablet TAKE 1 TABLET BY MOUTH EVERY DAY 90 tablet 1  . SUPREP BOWEL PREP KIT 17.5-3.13-1.6 GM/177ML SOLN SMARTSIG:1 Kit(s) By Mouth Once    . triamterene-hydrochlorothiazide (MAXZIDE-25) 37.5-25 MG tablet TAKE 1 TABLET BY MOUTH EVERY DAY 90 tablet 2  . Wheat Dextrin (BENEFIBER PO) Take by mouth. Daily    .  zolpidem (AMBIEN) 10 MG tablet Take 1 tablet (10 mg total) by mouth at bedtime as needed for sleep. 30 tablet 2  . [DISCONTINUED] omeprazole (PRILOSEC OTC) 20 MG tablet Take 1 tablet (20 mg total) by mouth daily. 30 tablet 1   No current facility-administered medications on file prior to visit.    Allergies  Allergen Reactions  . Ciprofloxacin   . Citalopram Hydrobromide   . Escitalopram Oxalate   . Paroxetine   . Polysporin [Bacitracin-Polymyxin B]     Objective: Physical Exam  General: Well developed, nourished, no acute distress, awake, alert and oriented x 3  Vascular: Dorsalis pedis artery 1/4 bilateral,  Posterior tibial artery 1/4 bilateral, skin temperature warm to warm proximal to distal bilateral lower extremities, no varicosities, pedal hair present bilateral.  Neurological: Gross sensation present via light touch bilateral.   Dermatological: Skin is warm, dry, and supple bilateral, Nails 1-10 are tender, long, thick, and discolored with mild subungal debris, no acute ingrowing noted, no webspace macerations present bilateral, no open lesions present bilateral, no callus/corns/hyperkeratotic tissue present bilateral. No signs of infection bilateral.  Musculoskeletal: Asymptomatic pes planus and bunion boney deformities noted bilateral. Muscular strength within normal limits without painon range of motion. No pain with calf compression bilateral.  Assessment and Plan:  Problem List Items Addressed This Visit      Endocrine   Diabetes mellitus without complication ([place])    Other Visit Diagnoses    Pain due to onychomycosis of toenails of both feet    -  Primary   PVD (peripheral vascular disease) ([place])         -Examined patient.  -Re-Discussed treatment options for painful mycotic nails -Mechanically debrided and reduced mycotic nails with sterile nail nipper and dremel nail file without incident.  -Patient to return in 2.5  months for follow up evaluation or sooner if symptoms worsen.  [name], [name] Subjective: Madeline Wood is a 74 y.o. female patient seen today in office with complaint of mildly painful thickened and elongated toenails; unable to trim.  Reports that her toes are doing better after getting her nails trim denies any changes with medication or any pedal complaints at this time.  Last A1c seen with recorded at 5.9 in June 2020 and does not check her blood sugars currently monitors because consider now prediabetic.  Denies any other pedal complaints at this time.  Patient Active Problem List   Diagnosis Date Noted  . Allergic reaction 09/29/2019  . Allergic  rhinitis 09/13/2018  . Bilateral high frequency sensorineural hearing loss 01/17/2018  . Subjective tinnitus of left ear 01/17/2018  . Atherosclerosis of native coronary artery of native heart without angina pectoris 07/23/2017  . History of food allergy 01/05/2016  . Angioedema 11/17/2015  . Allergic rhinoconjunctivitis, seasonal and perennial 05/04/2015  . Insomnia 08/24/2014  . Bilateral leg edema 04/17/2014  . Diabetes mellitus without complication ([place])   . Essential hypertension   . Gout   . Mixed hyperlipidemia   . 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 Anxiety   . Obesity   . Knee pain   . Positive TB test   . Hiatal hernia   . GERD (gastroesophageal reflux disease)   . Coronary atherosclerosis 06/06/2010  . GERD 06/06/2010  . History of colonic polyps 06/06/2010    Current Outpatient Medications on File Prior to Visit  Medication Sig Dispense Refill  . albuterol (VENTOLIN HFA) 108 (90 Base) MCG/ACT inhaler Inhale 2 puffs into the lungs every  6 (six) hours as needed for wheezing or shortness of breath. 18 g 11  . ALPRAZolam (XANAX) 1 MG tablet Take 1 tablet (1 mg total) by mouth 2 (two) times daily. 60 tablet 2  . aspirin 81 MG tablet Take 81 mg by mouth daily.      [name] atorvastatin (LIPITOR) 80 MG tablet TAKE 1 TABLET BY MOUTH EVERY DAY 90 tablet 1  . cyclobenzaprine (FLEXERIL) 5 MG tablet Take 1 tablet (5 mg total) by mouth 3 (three) times daily as needed for muscle spasms. 20 tablet 0  . fexofenadine (ALLEGRA) 180 MG tablet Take 1 tablet (180 mg total) by mouth daily. 30 tablet 5  . fish oil-omega-3 fatty acids 1000 MG capsule Take 1 g by mouth. Takes occasionally    . fluticasone (FLONASE) 50 MCG/ACT nasal spray Place 1-2 sprays into both nostrils daily as needed for allergies or rhinitis. 48 g 3  . losartan (COZAAR) 100 MG tablet Take 1 tablet (100 mg total) by mouth daily. 90 tablet 3  . meloxicam (MOBIC) 7.5 MG tablet TAKE 1 TABLET BY MOUTH EVERY DAY 30 tablet 3  . metoprolol  tartrate (LOPRESSOR) 50 MG tablet TAKE 1 TABLET EVERY MORNING AND 1 AND 1/2 TABLET EVERY EVENING 225 tablet 0  . Misc Natural Products (GLUCOSAMINE CHOND COMPLEX/MSM PO) Take by mouth. Occasionally    . montelukast (SINGULAIR) 10 MG tablet TAKE 1 TABLET BY MOUTH EVERYDAY AT BEDTIME 90 tablet 1  . Multiple Vitamin (MULTIVITAMIN) tablet Take 1 tablet by mouth. occasionally    . nitroGLYCERIN (NITROSTAT) 0.4 MG SL tablet PLACE 1 TABLET UNDER THE TONGUE EVERY 5 MINUTES AS NEEDED FOR CHEST PAIN. 75 tablet 1  . pantoprazole (PROTONIX) 40 MG tablet TAKE 1 TABLET BY MOUTH EVERY DAY 90 tablet 1  . SUPREP BOWEL PREP KIT 17.5-3.13-1.6 GM/177ML SOLN SMARTSIG:1 Kit(s) By Mouth Once    . triamterene-hydrochlorothiazide (MAXZIDE-25) 37.5-25 MG tablet TAKE 1 TABLET BY MOUTH EVERY DAY 90 tablet 2  . Wheat Dextrin (BENEFIBER PO) Take by mouth. Daily    . zolpidem (AMBIEN) 10 MG tablet Take 1 tablet (10 mg total) by mouth at bedtime as needed for sleep. 30 tablet 2  . [DISCONTINUED] omeprazole (PRILOSEC OTC) 20 MG tablet Take 1 tablet (20 mg total) by mouth daily. 30 tablet 1   No current facility-administered medications on file prior to visit.    Allergies  Allergen Reactions  . Ciprofloxacin   . Citalopram Hydrobromide   . Escitalopram Oxalate   . Paroxetine   . Polysporin [Bacitracin-Polymyxin B]     Objective: Physical Exam  General: Well developed, nourished, no acute distress, awake, alert and oriented x 3  Vascular: Dorsalis pedis artery 1/4 bilateral, Posterior tibial artery 1/4 bilateral, skin temperature warm to warm proximal to distal bilateral lower extremities, no varicosities, pedal hair present bilateral.  Neurological: Gross sensation present via light touch bilateral.   Dermatological: Skin is warm, dry, and supple bilateral, Nails 1-10 are tender, long, thick, and discolored with mild subungal debris, no acute ingrowing noted, no webspace macerations present bilateral, no open  lesions present bilateral, no callus/corns/hyperkeratotic tissue present bilateral. No signs of infection bilateral.  Musculoskeletal: Asymptomatic pes planus and bunion boney deformities noted bilateral. Muscular strength within normal limits without painon range of motion. No pain with calf compression bilateral.  Assessment and Plan:  Problem List Items Addressed This Visit      Endocrine   Diabetes mellitus without complication ([place])    Other Visit Diagnoses  Pain due to onychomycosis of toenails of both feet    -  Primary   PVD (peripheral vascular disease) ([facility])         -Examined patient.  -Re-Discussed treatment options for painful mycotic nails -Mechanically debrided and reduced mycotic nails with sterile nail nipper and dremel nail file without incident.  -Patient to return in 2.5  months for follow up evaluation or sooner if symptoms worsen.  [name], [name] Subjective: Madeline Wood is a 74 y.o. female patient seen today in office with complaint of mildly painful thickened and elongated toenails; unable to trim.  Reports that her toes are doing better after getting her nails trim denies any changes with medication or any pedal complaints at this time.  Last A1c seen with recorded at 5.9 in June 2020 and does not check her blood sugars currently monitors because consider now prediabetic.  Denies any other pedal complaints at this time.  Patient Active Problem List   Diagnosis Date Noted  . Allergic reaction 09/29/2019  . Allergic rhinitis 09/13/2018  . Bilateral high frequency sensorineural hearing loss 01/17/2018  . Subjective tinnitus of left ear 01/17/2018  . Atherosclerosis of native coronary artery of native heart without angina pectoris 07/23/2017  . History of food allergy 01/05/2016  . Angioedema 11/17/2015  . Allergic rhinoconjunctivitis, seasonal and perennial 05/04/2015  . Insomnia 08/24/2014  . Bilateral leg edema 04/17/2014  . Diabetes mellitus  without complication ([place])   . Essential hypertension   . Gout   . Mixed hyperlipidemia   . 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 Anxiety   . Obesity   . Knee pain   . Positive TB test   . Hiatal hernia   . GERD (gastroesophageal reflux disease)   . Coronary atherosclerosis 06/06/2010  . GERD 06/06/2010  . History of colonic polyps 06/06/2010    Current Outpatient Medications on File Prior to Visit  Medication Sig Dispense Refill  . albuterol (VENTOLIN HFA) 108 (90 Base) MCG/ACT inhaler Inhale 2 puffs into the lungs every 6 (six) hours as needed for wheezing or shortness of breath. 18 g 11  . ALPRAZolam (XANAX) 1 MG tablet Take 1 tablet (1 mg total) by mouth 2 (two) times daily. 60 tablet 2  . aspirin 81 MG tablet Take 81 mg by mouth daily.      [name] atorvastatin (LIPITOR) 80 MG tablet TAKE 1 TABLET BY MOUTH EVERY DAY 90 tablet 1  . cyclobenzaprine (FLEXERIL) 5 MG tablet Take 1 tablet (5 mg total) by mouth 3 (three) times daily as needed for muscle spasms. 20 tablet 0  . fexofenadine (ALLEGRA) 180 MG tablet Take 1 tablet (180 mg total) by mouth daily. 30 tablet 5  . fish oil-omega-3 fatty acids 1000 MG capsule Take 1 g by mouth. Takes occasionally    . fluticasone (FLONASE) 50 MCG/ACT nasal spray Place 1-2 sprays into both nostrils daily as needed for allergies or rhinitis. 48 g 3  . losartan (COZAAR) 100 MG tablet Take 1 tablet (100 mg total) by mouth daily. 90 tablet 3  . meloxicam (MOBIC) 7.5 MG tablet TAKE 1 TABLET BY MOUTH EVERY DAY 30 tablet 3  . metoprolol tartrate (LOPRESSOR) 50 MG tablet TAKE 1 TABLET EVERY MORNING AND 1 AND 1/2 TABLET EVERY EVENING 225 tablet 0  . Misc Natural Products (GLUCOSAMINE CHOND COMPLEX/MSM PO) Take by mouth. Occasionally    . montelukast (SINGULAIR) 10 MG tablet TAKE 1 TABLET BY MOUTH EVERYDAY AT BEDTIME 90 tablet 1  .  Multiple Vitamin (MULTIVITAMIN) tablet Take 1 tablet by mouth. occasionally    . nitroGLYCERIN (NITROSTAT) 0.4 MG SL tablet PLACE 1 TABLET UNDER  THE TONGUE EVERY 5 MINUTES AS NEEDED FOR CHEST PAIN. 75 tablet 1  . pantoprazole (PROTONIX) 40 MG tablet TAKE 1 TABLET BY MOUTH EVERY DAY 90 tablet 1  . SUPREP BOWEL PREP KIT 17.5-3.13-1.6 GM/177ML SOLN SMARTSIG:1 Kit(s) By Mouth Once    . triamterene-hydrochlorothiazide (MAXZIDE-25) 37.5-25 MG tablet TAKE 1 TABLET BY MOUTH EVERY DAY 90 tablet 2  . Wheat Dextrin (BENEFIBER PO) Take by mouth. Daily    . zolpidem (AMBIEN) 10 MG tablet Take 1 tablet (10 mg total) by mouth at bedtime as needed for sleep. 30 tablet 2  . [DISCONTINUED] omeprazole (PRILOSEC OTC) 20 MG tablet Take 1 tablet (20 mg total) by mouth daily. 30 tablet 1   No current facility-administered medications on file prior to visit.    Allergies  Allergen Reactions  . Ciprofloxacin   . Citalopram Hydrobromide   . Escitalopram Oxalate   . Paroxetine   . Polysporin [Bacitracin-Polymyxin B]     Objective: Physical Exam  General: Well developed, nourished, no acute distress, awake, alert and oriented x 3  Vascular: Dorsalis pedis artery 1/4 bilateral, Posterior tibial artery 1/4 bilateral, skin temperature warm to warm proximal to distal bilateral lower extremities, no varicosities, pedal hair present bilateral.  Neurological: Gross sensation present via light touch bilateral.   Dermatological: Skin is warm, dry, and supple bilateral, Nails 1-10 are tender, long, thick, and discolored with mild subungal debris, no acute ingrowing noted, no webspace macerations present bilateral, no open lesions present bilateral, no callus/corns/hyperkeratotic tissue present bilateral. No signs of infection bilateral.  Musculoskeletal: Asymptomatic pes planus and bunion boney deformities noted bilateral. Muscular strength within normal limits without painon range of motion. No pain with calf compression bilateral.  Assessment and Plan:  Problem List Items Addressed This Visit      Endocrine   Diabetes mellitus without complication ([place])     Other Visit Diagnoses    Pain due to onychomycosis of toenails of both feet    -  Primary   PVD (peripheral vascular disease) ([place])         -Examined patient.  -Re-Discussed treatment options for painful mycotic nails like previous -Mechanically debrided and reduced mycotic nails with sterile nail nipper and dremel nail file without incident.  -Patient to return in 2.5  months for follow up evaluation or sooner if symptoms worsen.  [name], [name]

## 2020-05-13 ENCOUNTER — Ambulatory Visit (INDEPENDENT_AMBULATORY_CARE_PROVIDER_SITE_OTHER): Payer: Medicare Other | Admitting: *Deleted

## 2020-05-13 DIAGNOSIS — J309 Allergic rhinitis, unspecified: Secondary | ICD-10-CM | POA: Diagnosis not present

## 2020-05-21 ENCOUNTER — Ambulatory Visit (INDEPENDENT_AMBULATORY_CARE_PROVIDER_SITE_OTHER): Payer: Medicare Other

## 2020-05-21 DIAGNOSIS — J309 Allergic rhinitis, unspecified: Secondary | ICD-10-CM | POA: Diagnosis not present

## 2020-05-30 ENCOUNTER — Other Ambulatory Visit: Payer: Self-pay | Admitting: Internal Medicine

## 2020-05-31 ENCOUNTER — Ambulatory Visit (INDEPENDENT_AMBULATORY_CARE_PROVIDER_SITE_OTHER): Payer: Medicare Other

## 2020-05-31 DIAGNOSIS — J309 Allergic rhinitis, unspecified: Secondary | ICD-10-CM

## 2020-06-02 ENCOUNTER — Other Ambulatory Visit: Payer: Self-pay

## 2020-06-02 ENCOUNTER — Ambulatory Visit (AMBULATORY_SURGERY_CENTER): Payer: Medicare Other | Admitting: Gastroenterology

## 2020-06-02 ENCOUNTER — Encounter: Payer: Self-pay | Admitting: Gastroenterology

## 2020-06-02 VITALS — BP 127/77 | HR 66 | Temp 96.6°F | Resp 17 | Ht 66.0 in | Wt 204.0 lb

## 2020-06-02 DIAGNOSIS — K635 Polyp of colon: Secondary | ICD-10-CM | POA: Diagnosis not present

## 2020-06-02 DIAGNOSIS — Z1211 Encounter for screening for malignant neoplasm of colon: Secondary | ICD-10-CM | POA: Diagnosis not present

## 2020-06-02 DIAGNOSIS — D123 Benign neoplasm of transverse colon: Secondary | ICD-10-CM

## 2020-06-02 DIAGNOSIS — K6389 Other specified diseases of intestine: Secondary | ICD-10-CM | POA: Diagnosis not present

## 2020-06-02 MED ORDER — SODIUM CHLORIDE 0.9 % IV SOLN: 500.0000 mL | Freq: Once | INTRAVENOUS | Status: DC

## 2020-06-02 NOTE — Progress Notes (Signed)
PT taken to PACU. Monitors in place. VSS. Report given to RN. 

## 2020-06-02 NOTE — Patient Instructions (Signed)
Handouts provided on polyps, hemorrhoids, and diverticulosis  Await pathology results    YOU HAD AN ENDOSCOPIC PROCEDURE TODAY AT [facility]:   Refer to the procedure report that was given to you for any specific questions about what was found during the examination.  If the procedure report does not answer your questions, please call your gastroenterologist to clarify.  If you requested that your care partner not be given the details of your procedure findings, then the procedure report has been included in a sealed envelope for you to review at your convenience later.  YOU SHOULD EXPECT: Some feelings of bloating in the abdomen. Passage of more gas than usual.  Walking can help get rid of the air that was put into your GI tract during the procedure and reduce the bloating. If you had a lower endoscopy (such as a colonoscopy or flexible sigmoidoscopy) you may notice spotting of blood in your stool or on the toilet paper. If you underwent a bowel prep for your procedure, you may not have a normal bowel movement for a few days.  Please Note:  You might notice some irritation and congestion in your nose or some drainage.  This is from the oxygen used during your procedure.  There is no need for concern and it should clear up in a day or so.  SYMPTOMS TO REPORT IMMEDIATELY:   Following lower endoscopy (colonoscopy or flexible sigmoidoscopy):  Excessive amounts of blood in the stool  Significant tenderness or worsening of abdominal pains  Swelling of the abdomen that is new, acute  Fever of 100F or higher  For urgent or emergent issues, a gastroenterologist can be reached at any hour by calling [phone]. Do not use MyChart messaging for urgent concerns.    DIET:  We do recommend a small meal at first, but then you may proceed to your regular diet.  Drink plenty of fluids but you should avoid alcoholic beverages for 24 hours.  ACTIVITY:  You should plan to take it easy  for the rest of today and you should NOT DRIVE or use heavy machinery until tomorrow (because of the sedation medicines used during the test).    FOLLOW UP: Our staff will call the number listed on your records 48-72 hours following your procedure to check on you and address any questions or concerns that you may have regarding the information given to you following your procedure. If we do not reach you, we will leave a message.  We will attempt to reach you two times.  During this call, we will ask if you have developed any symptoms of COVID 19. If you develop any symptoms (ie: fever, flu-like symptoms, shortness of breath, cough etc.) before then, please call [phone].  If you test positive for Covid 19 in the 2 weeks post procedure, please call and report this information to [place].    If any biopsies were taken you will be contacted by phone or by letter within the next 1-3 weeks.  Please call us at [phone] if you have not heard about the biopsies in 3 weeks.    SIGNATURES/CONFIDENTIALITY: You and/or your care partner have signed paperwork which will be entered into your electronic medical record.  These signatures attest to the fact that that the information above on your After Visit Summary has been reviewed and is understood.  Full responsibility of the confidentiality of this discharge information lies with you and/or your care-partner.

## 2020-06-02 NOTE — Op Note (Signed)
[facility] Patient Name: Madeline Wood Procedure Date: 06/02/2020 2:12 PM MRN: [id number] Endoscopist: Ladene Artist , MD Age: 74 Referring MD:  Date of Birth: April 23, 1946 Gender: Female Account #: [id number] Procedure:                Colonoscopy Indications:              Screening for colorectal malignant neoplasm Medicines:                Monitored Anesthesia Care Procedure:                Pre-Anesthesia Assessment:                           - Prior to the procedure, a History and Physical                            was performed, and patient medications and                            allergies were reviewed. The patient's tolerance of                            previous anesthesia was also reviewed. The risks                            and benefits of the procedure and the sedation                            options and risks were discussed with the patient.                            All questions were answered, and informed consent                            was obtained. Prior Anticoagulants: The patient has                            taken no previous anticoagulant or antiplatelet                            agents. ASA Grade Assessment: III - A patient with                            severe systemic disease. After reviewing the risks                            and benefits, the patient was deemed in                            satisfactory condition to undergo the procedure.                           After obtaining informed consent, the colonoscope  was passed under direct vision. Throughout the                            procedure, the patient's blood pressure, pulse, and                            oxygen saturations were monitored continuously. The                            Colonoscope was introduced through the anus and                            advanced to the the cecum, identified by                            appendiceal orifice and  ileocecal valve. The                            ileocecal valve, appendiceal orifice, and rectum                            were photographed. The quality of the bowel                            preparation was good. The colonoscopy was somewhat                            difficult due to significant looping and a tortuous                            colon. The patient tolerated the procedure well. Scope In: 2:19:49 PM Scope Out: 2:33:21 PM Scope Withdrawal Time: 0 hours 8 minutes 21 seconds  Total Procedure Duration: 0 hours 13 minutes 32 seconds  Findings:                 The perianal and digital rectal examinations were                            normal.                           A few medium-mouthed diverticula were found in the                            right colon. There was no evidence of diverticular                            bleeding.                           A single small localized angiodysplastic lesion                            without bleeding was found in the cecum.  A 7 mm polyp was found in the transverse colon. The                            polyp was sessile. The polyp was removed with a                            cold snare. Resection and retrieval were complete.                           Multiple medium-mouthed diverticula were found in                            the left colon. There was narrowing of the colon in                            association with the diverticular opening. There                            was evidence of diverticular spasm.                            Peri-diverticular erythema was seen. There was no                            evidence of diverticular bleeding.                           Internal hemorrhoids were found during                            retroflexion. The hemorrhoids were medium-sized and                            Grade I (internal hemorrhoids that do not prolapse).                           The  exam was otherwise without abnormality on                            direct and retroflexion views. Complications:            No immediate complications. Estimated blood loss:                            None. Estimated Blood Loss:     Estimated blood loss: none. Impression:               - Mild diverticulosis in the right colon. .                           - A single non-bleeding colonic angiodysplastic                            lesion.                           -  One 7 mm polyp in the transverse colon, removed                            with a cold snare. Resected and retrieved.                           - Moderate diverticulosis in the left colon.                           - Internal hemorrhoids.                           - The examination was otherwise normal on direct                            and retroflexion views. Recommendation:           - Patient has a contact number available for                            emergencies. The signs and symptoms of potential                            delayed complications were discussed with the                            patient. Return to normal activities tomorrow.                            Written discharge instructions were provided to the                            patient.                           - High fiber diet.                           - Continue present medications.                           - Await pathology results.                           - No repeat colonoscopy due to age. Ladene Artist, MD 06/02/2020 2:53:42 PM This report has been signed electronically.

## 2020-06-02 NOTE — Progress Notes (Signed)
Called to room to assist during endoscopic procedure.  Patient ID and intended procedure confirmed with present staff. Received instructions for my participation in the procedure from the performing physician.  

## 2020-06-04 ENCOUNTER — Telehealth: Payer: Self-pay | Admitting: *Deleted

## 2020-06-04 NOTE — Telephone Encounter (Signed)
  Follow up Call-  Call back number 06/02/2020  Post procedure Call Back phone  # [phone]  Permission to leave phone message Yes  Some recent data might be hidden     Patient questions:  Do you have a fever, pain , or abdominal swelling? No. Pain Score  0 *  Have you tolerated food without any problems? Yes.    Have you been able to return to your normal activities? Yes.    Do you have any questions about your discharge instructions: Diet   No. Medications  No. Follow up visit  No.  Do you have questions or concerns about your Care? No.  Actions: * If pain score is 4 or above: No action needed, pain <4.  1. Have you developed a fever since your procedure? no  2.   Have you had an respiratory symptoms (SOB or cough) since your procedure? no  3.   Have you tested positive for COVID 19 since your procedure no  4.   Have you had any family members/close contacts diagnosed with the COVID 19 since your procedure?  no   If yes to any of these questions please route to Joylene John, RN and Joella Prince, RN

## 2020-06-07 ENCOUNTER — Ambulatory Visit (INDEPENDENT_AMBULATORY_CARE_PROVIDER_SITE_OTHER): Payer: Medicare Other

## 2020-06-07 DIAGNOSIS — J309 Allergic rhinitis, unspecified: Secondary | ICD-10-CM

## 2020-06-10 ENCOUNTER — Encounter: Payer: Self-pay | Admitting: Gastroenterology

## 2020-06-15 ENCOUNTER — Telehealth: Payer: Self-pay | Admitting: Family Medicine

## 2020-06-15 NOTE — Progress Notes (Signed)
  Chronic Care Management   Note  06/15/2020 Name: HABIBA TRELOAR MRN: [id number] DOB: [birth year]  MYRL BYNUM is a 74 y.o. year old female who is a primary care patient of Dennard Schaumann, Cammie Mcgee, MD. I reached out to [name] by phone today in response to a referral sent by Ms. Burman Freestone Rajkumar's PCP, [name], MD.   Ms. Howse was given information about Chronic Care Management services today including:  1. CCM service includes personalized support from designated clinical staff supervised by her physician, including individualized plan of care and coordination with other care providers 2. 24/7 contact phone numbers for assistance for urgent and routine care needs. 3. Service will only be billed when office clinical staff spend 20 minutes or more in a month to coordinate care. 4. Only one practitioner may furnish and bill the service in a calendar month. 5. The patient may stop CCM services at any time (effective at the end of the month) by phone call to the office staff.   Patient agreed to services and verbal consent obtained.   Follow up plan:   Carley Perdue [employer] Scheduler

## 2020-06-17 ENCOUNTER — Telehealth: Payer: Medicare Other

## 2020-06-17 ENCOUNTER — Ambulatory Visit (INDEPENDENT_AMBULATORY_CARE_PROVIDER_SITE_OTHER): Payer: Medicare Other

## 2020-06-17 DIAGNOSIS — J309 Allergic rhinitis, unspecified: Secondary | ICD-10-CM

## 2020-06-18 DIAGNOSIS — Z780 Asymptomatic menopausal state: Secondary | ICD-10-CM | POA: Diagnosis not present

## 2020-06-24 ENCOUNTER — Ambulatory Visit (INDEPENDENT_AMBULATORY_CARE_PROVIDER_SITE_OTHER): Payer: Medicare Other

## 2020-06-24 DIAGNOSIS — J309 Allergic rhinitis, unspecified: Secondary | ICD-10-CM | POA: Diagnosis not present
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
## 2020-06-29 ENCOUNTER — Telehealth: Payer: Self-pay | Admitting: Pharmacist

## 2020-06-29 ENCOUNTER — Other Ambulatory Visit: Payer: Self-pay | Admitting: *Deleted

## 2020-06-29 DIAGNOSIS — E782 Mixed hyperlipidemia: Secondary | ICD-10-CM

## 2020-06-29 DIAGNOSIS — E119 Type 2 diabetes mellitus without complications: Secondary | ICD-10-CM

## 2020-06-29 DIAGNOSIS — I10 Essential (primary) hypertension: Secondary | ICD-10-CM

## 2020-06-29 DIAGNOSIS — I2510 Atherosclerotic heart disease of native coronary artery without angina pectoris: Secondary | ICD-10-CM
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
## 2020-06-30 ENCOUNTER — Ambulatory Visit: Payer: Medicare Other | Admitting: Pharmacist

## 2020-06-30 DIAGNOSIS — E119 Type 2 diabetes mellitus without complications: Secondary | ICD-10-CM

## 2020-06-30 DIAGNOSIS — E782 Mixed hyperlipidemia: Secondary | ICD-10-CM

## 2020-06-30 DIAGNOSIS — I10 Essential (primary) hypertension: Secondary | ICD-10-CM

## 2020-06-30 NOTE — Patient Instructions (Addendum)
Visit Information Thank you for meeting with me today!  I look forward to working with you to help you meet all of your healthcare goals and answer any questions you may have.  Feel free to contact me anytime!  Goals Addressed            This Visit's Progress   . Pharmacy Care Plan:       CARE PLAN ENTRY (see longitudinal plan of care for additional care plan information)  Current Barriers:  . Chronic Disease Management support, education, and care coordination needs related to Hypertension, Hyperlipidemia, and Diabetes   Hypertension BP Readings from Last 3 Encounters:  06/02/20 127/77  04/20/20 140/80  04/14/20 (!) 141/68   . Pharmacist Clinical Goal(s): o Over the next 180 days, patient will work with PharmD and providers to maintain BP goal <140/90 . Current regimen:  . Losartan 50mg  daily  . Metoprolol tartrate 50mg  tablet one tablet every morning and one and one-half tablet every evening . Triamterene/HCTZ 37.5-25mg  . Interventions: o Reviewed home BP monitoring o Discussed need for continued physical activity . Patient self care activities - Over the next 180 days, patient will: o Check BP daily, document, and provide at future appointments o Ensure daily salt intake < 2300 mg/day  Hyperlipidemia Lab Results  Component Value Date/Time   LDLCALC 75 02/16/2020 08:33 AM   . Pharmacist Clinical Goal(s): o Over the next 180 days, patient will work with PharmD and providers to achieve LDL goal < 70 . Current regimen:  o Atorvastatin 80mg  . Interventions: o Reviewed most recent lipid panel o Discussed goals of LDL < 70.   o Encouraged continued efforts on diet and exercise . Patient self care activities - Over the next 180 days, patient will: o Continue to focus on medication adherence by pill count o Continue progress made on diet and exercise  Diabetes Lab Results  Component Value Date/Time   HGBA1C 5.9 (H) 02/16/2020 08:33 AM   HGBA1C 5.9 02/18/2019 12:00 AM    HGBA1C 6.0 (H) 10/15/2018 10:38 AM   . Pharmacist Clinical Goal(s): o Over the next 180 days, patient will work with PharmD and providers to achieve A1c goal  < 5.7 . Current regimen:  o No medications . Interventions: o Discussed importance of diet and exercise on prevention of diabetes o Encouraged patient to continue lifestyle mods . Patient self care activities - Over the next 180 days, patient will: o Continue to work to prevent further progression of diabetes    Initial goal documentation        Madeline Wood was given information about Chronic Care Management services today including:  1. CCM service includes personalized support from designated clinical staff supervised by her physician, including individualized plan of care and coordination with other care providers 2. 24/7 contact phone numbers for assistance for urgent and routine care needs. 3. Standard insurance, coinsurance, copays and deductibles apply for chronic care management only during months in which we provide at least 20 minutes of these services. Most insurances cover these services at 100%, however patients may be responsible for any copay, coinsurance and/or deductible if applicable. This service may help you avoid the need for more expensive face-to-face services. 4. Only one practitioner may furnish and bill the service in a calendar month. 5. The patient may stop CCM services at any time (effective at the end of the month) by phone call to the office staff.  Patient agreed to services and verbal consent obtained.  The patient verbalized understanding of instructions provided today and agreed to receive a mailed copy of patient instruction and/or educational materials. Telephone follow up appointment with pharmacy team member scheduled for: 59 months  Madeline Wood, PharmD Clinical Pharmacist [facility] [phone]   Diabetes Mellitus and Nutrition, Adult When you have  diabetes (diabetes mellitus), it is very important to have healthy eating habits because your blood sugar (glucose) levels are greatly affected by what you eat and drink. Eating healthy foods in the appropriate amounts, at about the same times every day, can help you:  Control your blood glucose.  Lower your risk of heart disease.  Improve your blood pressure.  Reach or maintain a healthy weight. Every person with diabetes is different, and each person has different needs for a meal plan. Your health care provider may recommend that you work with a diet and nutrition specialist (dietitian) to make a meal plan that is best for you. Your meal plan may vary depending on factors such as:  The calories you need.  The medicines you take.  Your weight.  Your blood glucose, blood pressure, and cholesterol levels.  Your activity level.  Other health conditions you have, such as heart or kidney disease. How do carbohydrates affect me? Carbohydrates, also called carbs, affect your blood glucose level more than any other type of food. Eating carbs naturally raises the amount of glucose in your blood. Carb counting is a method for keeping track of how many carbs you eat. Counting carbs is important to keep your blood glucose at a healthy level, especially if you use insulin or take certain oral diabetes medicines. It is important to know how many carbs you can safely have in each meal. This is different for every person. Your dietitian can help you calculate how many carbs you should have at each meal and for each snack. Foods that contain carbs include:  Bread, cereal, rice, pasta, and crackers.  Potatoes and corn.  Peas, beans, and lentils.  Milk and yogurt.  Fruit and juice.  Desserts, such as cakes, cookies, ice cream, and candy. How does alcohol affect me? Alcohol can cause a sudden decrease in blood glucose (hypoglycemia), especially if you use insulin or take certain oral diabetes  medicines. Hypoglycemia can be a life-threatening condition. Symptoms of hypoglycemia (sleepiness, dizziness, and confusion) are similar to symptoms of having too much alcohol. If your health care provider says that alcohol is safe for you, follow these guidelines:  Limit alcohol intake to no more than 1 drink per day for nonpregnant women and 2 drinks per day for men. One drink equals 12 oz of beer, 5 oz of wine, or 1 oz of hard liquor.  Do not drink on an empty stomach.  Keep yourself hydrated with water, diet soda, or unsweetened iced tea.  Keep in mind that regular soda, juice, and other mixers may contain a lot of sugar and must be counted as carbs. What are tips for following this plan?  Reading food labels  Start by checking the serving size on the "Nutrition Facts" label of packaged foods and drinks. The amount of calories, carbs, fats, and other nutrients listed on the label is based on one serving of the item. Many items contain more than one serving per package.  Check the total grams (g) of carbs in one serving. You can calculate the number of servings of carbs in one serving by dividing the total carbs by 15. For example, if  a food has 30 g of total carbs, it would be equal to 2 servings of carbs.  Check the number of grams (g) of saturated and trans fats in one serving. Choose foods that have low or no amount of these fats.  Check the number of milligrams (mg) of salt (sodium) in one serving. Most people should limit total sodium intake to less than 2,300 mg per day.  Always check the nutrition information of foods labeled as "low-fat" or "nonfat". These foods may be higher in added sugar or refined carbs and should be avoided.  Talk to your dietitian to identify your daily goals for nutrients listed on the label. Shopping  Avoid buying canned, premade, or processed foods. These foods tend to be high in fat, sodium, and added sugar.  Shop around the outside edge of the  grocery store. This includes fresh fruits and vegetables, bulk grains, fresh meats, and fresh dairy. Cooking  Use low-heat cooking methods, such as baking, instead of high-heat cooking methods like deep frying.  Cook using healthy oils, such as olive, canola, or sunflower oil.  Avoid cooking with butter, cream, or high-fat meats. Meal planning  Eat meals and snacks regularly, preferably at the same times every day. Avoid going long periods of time without eating.  Eat foods high in fiber, such as fresh fruits, vegetables, beans, and whole grains. Talk to your dietitian about how many servings of carbs you can eat at each meal.  Eat 4-6 ounces (oz) of lean protein each day, such as lean meat, chicken, fish, eggs, or tofu. One oz of lean protein is equal to: ? 1 oz of meat, chicken, or fish. ? 1 egg. ?  cup of tofu.  Eat some foods each day that contain healthy fats, such as avocado, nuts, seeds, and fish. Lifestyle  Check your blood glucose regularly.  Exercise regularly as told by your health care provider. This may include: ? 150 minutes of moderate-intensity or vigorous-intensity exercise each week. This could be brisk walking, biking, or water aerobics. ? Stretching and doing strength exercises, such as yoga or weightlifting, at least 2 times a week.  Take medicines as told by your health care provider.  Do not use any products that contain nicotine or tobacco, such as cigarettes and e-cigarettes. If you need help quitting, ask your health care provider.  Work with a Social worker or diabetes educator to identify strategies to manage stress and any emotional and social challenges. Questions to ask a health care provider  Do I need to meet with a diabetes educator?  Do I need to meet with a dietitian?  What number can I call if I have questions?  When are the best times to check my blood glucose? Where to find more information:  American Diabetes Association:  diabetes.org  Academy of Nutrition and Dietetics: www.eatright.[employer] of Diabetes and Digestive and Kidney Diseases (NIH): [employer] Summary  A healthy meal plan will help you control your blood glucose and maintain a healthy lifestyle.  Working with a diet and nutrition specialist (dietitian) can help you make a meal plan that is best for you.  Keep in mind that carbohydrates (carbs) and alcohol have immediate effects on your blood glucose levels. It is important to count carbs and to use alcohol carefully. This information is not intended to replace advice given to you by your health care provider. Make sure you discuss any questions you have with your health care provider. Document Revised: 07/27/2017 Document  Reviewed: 09/18/2016 Elsevier Patient Education  [employer].

## 2020-07-07 ENCOUNTER — Ambulatory Visit (INDEPENDENT_AMBULATORY_CARE_PROVIDER_SITE_OTHER): Payer: Medicare Other | Admitting: *Deleted

## 2020-07-07 DIAGNOSIS — J309 Allergic rhinitis, unspecified: Secondary | ICD-10-CM

## 2020-07-13 ENCOUNTER — Encounter: Payer: Self-pay | Admitting: Internal Medicine

## 2020-07-13 ENCOUNTER — Other Ambulatory Visit: Payer: Self-pay

## 2020-07-13 ENCOUNTER — Ambulatory Visit (INDEPENDENT_AMBULATORY_CARE_PROVIDER_SITE_OTHER): Payer: Medicare Other | Admitting: Internal Medicine

## 2020-07-13 VITALS — BP 110/86 | HR 75 | Ht 66.0 in | Wt 199.0 lb

## 2020-07-13 DIAGNOSIS — I10 Essential (primary) hypertension: Secondary | ICD-10-CM | POA: Diagnosis not present

## 2020-07-13 DIAGNOSIS — I2510 Atherosclerotic heart disease of native coronary artery without angina pectoris: Secondary | ICD-10-CM

## 2020-07-13 DIAGNOSIS — E782 Mixed hyperlipidemia: Secondary | ICD-10-CM | POA: Diagnosis not present

## 2020-07-13 NOTE — Progress Notes (Signed)
OFFICE NOTE  Chief Complaint:  Routine follow-up  Primary Care Physician: [name], MD  HPI:  Madeline Wood is a 74 year old female patient formerly followed by Dr. Rollene Fare, who works as a Photographer. She has a history of coronary disease and had a drug-eluting stent placed to the distal RCA in 2006. She had a negative Myoview in 2012. Just has a history of obesity, hypertension and dyslipidemia. In 2012 underwent back surgery by Dr. Lynann Bologna and has had a good recovery from that.  Recently she had some problems with low blood pressure and had a decrease in her losartan dose to 50 mg once daily which has helped. She has however been having some lower extremity swelling.  When she last saw Dr. Rollene Fare in April 2014, he recommended starting her Dyazide to one full tablet daily. She did not start that given her concern about the change in her blood pressure. It is noted however her blood pressure is slightly higher today 150/84. She is asymptomatic, denying any chest pain, shortness of breath, palpitations, presyncope or syncopal symptoms.  Madeline Wood returns today for followup.  She denies any chest pain or worsening shortness of breath. Recent laboratory work shows good control of her cholesterol.  She is currently on atorvastatin 80 mg daily and recently had that he had discontinued due to excellent cholesterol control. She is also on aspirin and Plavix as well as losartan and Lopressor.  06/01/2016  Madeline Wood returns today for follow-up. She's done very well over he past year. r worsening shortness of breath. Weight is stable and I've encouraged her to continue to work on lowering it. Cholesterol control is been excellent. She has been on long-standing aspirin and Plavix however her stent was in 2006. Is not clear that there is an ongoing indication for dual antiplatelet therapy.Blood pressure is well controlled. EKG shows normal sinus rhythm at 62.  07/23/2017  Mrs.  Wood returns today for follow-up.  She denies any new symptoms such as chest pain or worsening shortness of breath.  She had repeat lipid testing and May which showed an LDL C of 75, down slightly from her most recent testing.  Her hemoglobin A1c also is improved at 5.9.  This is mostly with dietary changes, what weight has been fairly stable.  She is not on any diabetic medications.  Reports a decrease in exercise recently and is committed to restarting that.  07/03/2018  Madeline Wood is seen today for routine follow-up.  Overall she continues to be without complaints.  Blood pressure initially was elevated 156/90 have her recheck came down to 144/88.  She was late for the appointment today and rush to get to the office.  She denies any new chest pain or worsening shortness of breath.  The lipid panel in July 2019 which showed total cholesterol 169, HDL 66, triglycerides 47 and LDL 89.  This represents good control however is not a goal LDL less than 70.  07/11/2019  Madeline Wood returns today for follow-up.  Overall she is well.  Her weight is stable.  Recent labs showed HDL 73 LDL 87.  She had extensive work-up as part of an insurance physical with multiple labs that are all pretty good.  Her blood pressure today was 124/76.  EKG shows sinus rhythm.  07/13/2020  Madeline Wood is seen today in follow-up.  This is a routine visit.  She continues to do well and is now 15 years out from PCI.  She denies any angina or worsening shortness of breath.  She has had some recent weight loss.  Blood pressures well controlled today.  EKG shows normal sinus rhythm.  She had lab work in June which showed total cholesterol 150, HDL 62, triglycerides 53 and LDL 75.  PMHx:  Past Medical History:  Diagnosis Date  . Allergy    seasonal/ environmental/ animals gets allergy injections   . 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 Angio-edema   . Anxiety   . Arthritis   . Blood transfusion without reported diagnosis    1985 with  hysterectomy   . CAD (coronary artery disease)    stent of distal RCA in 2006  . Diabetes mellitus without complication ([place])    diet controlled-   . Diverticulosis   . GERD (gastroesophageal reflux disease)   . Gout   . H/O heart artery stent   . Hiatal hernia   . History of nuclear stress test 03/2011   negative lexiscan myoview; normal perfusion  . Hyperlipidemia   . Hypertension   . Knee pain   . Neuromuscular disorder ([place])    Hiatal Hernia   . Obesity   . Positive TB test   . Venous insufficiency     Past Surgical History:  Procedure Laterality Date  . ABDOMINAL HYSTERECTOMY    . BACK SURGERY  2012  . BREAST LUMPECTOMY    . COLONOSCOPY  2011  . CORONARY ANGIOPLASTY WITH STENT PLACEMENT  06/29/2005   Taxus 2.5x77mm DES to distal RCA (Dr. Tami Ribas)  . TRANSTHORACIC ECHOCARDIOGRAM  12/20/2012   EF 54-00%, normal systolic function, mild hypokinesis of inf myocardium; calcified MV annulua, LA & RA mildly dilated    FAMHx:  Family History  Problem Relation Age of Onset  . Diabetes Sister   . Pancreatic cancer Brother   . Ovarian cancer Mother   . Other Father        homicide  . Heart attack Brother        x2  . Hypertension Brother   . Esophageal cancer Brother   . Prostate cancer Brother   . Hypertension Sister   . Colon cancer Neg Hx   . Colon polyps Neg Hx   . Rectal cancer Neg Hx   . Stomach cancer Neg Hx     SOCHx:   reports that she quit smoking about 18 years ago. She has never used smokeless tobacco. She reports that she does not drink alcohol and does not use drugs.  ALLERGIES:  Allergies  Allergen Reactions  . Ciprofloxacin   . Citalopram Hydrobromide   . Escitalopram Oxalate   . Paroxetine   . Polysporin [Bacitracin-Polymyxin B]     ROS: Pertinent items noted in HPI and remainder of comprehensive ROS otherwise negative.  HOME MEDS: Current Outpatient Medications  Medication Sig Dispense Refill  . albuterol (VENTOLIN HFA) 108 (90 Base)  MCG/ACT inhaler Inhale 2 puffs into the lungs every 6 (six) hours as needed for wheezing or shortness of breath. (Patient not taking: Reported on 06/02/2020) 18 g 11  . ALPRAZolam (XANAX) 1 MG tablet Take 1 tablet (1 mg total) by mouth 2 (two) times daily. 60 tablet 2  . aspirin 81 MG tablet Take 81 mg by mouth daily.      [name] atorvastatin (LIPITOR) 80 MG tablet TAKE 1 TABLET BY MOUTH EVERY DAY 90 tablet 1  . cyclobenzaprine (FLEXERIL) 5 MG tablet Take 1 tablet (5 mg total) by mouth 3 (three) times daily as  needed for muscle spasms. (Patient not taking: Reported on 06/30/2020) 20 tablet 0  . fexofenadine (ALLEGRA) 180 MG tablet Take 1 tablet (180 mg total) by mouth daily. 30 tablet 5  . fish oil-omega-3 fatty acids 1000 MG capsule Take 1 g by mouth. Takes occasionally (Patient not taking: Reported on 06/30/2020)    . fluticasone (FLONASE) 50 MCG/ACT nasal spray Place 1-2 sprays into both nostrils daily as needed for allergies or rhinitis. 48 g 3  . losartan (COZAAR) 100 MG tablet Take 1 tablet (100 mg total) by mouth daily. 90 tablet 3  . meloxicam (MOBIC) 7.5 MG tablet TAKE 1 TABLET BY MOUTH EVERY DAY (Patient not taking: Reported on 06/02/2020) 30 tablet 3  . metoprolol tartrate (LOPRESSOR) 50 MG tablet TAKE 1 TABLET EVERY MORNING AND 1 AND 1/2 TABLET EVERY EVENING 225 tablet 1  . Misc Natural Products (GLUCOSAMINE CHOND COMPLEX/MSM PO) Take by mouth. Occasionally (Patient not taking: Reported on 06/02/2020)    . montelukast (SINGULAIR) 10 MG tablet TAKE 1 TABLET BY MOUTH EVERYDAY AT BEDTIME (Patient not taking: Reported on 06/30/2020) 90 tablet 1  . Multiple Vitamin (MULTIVITAMIN) tablet Take 1 tablet by mouth. occasionally    . nitroGLYCERIN (NITROSTAT) 0.4 MG SL tablet PLACE 1 TABLET UNDER THE TONGUE EVERY 5 MINUTES AS NEEDED FOR CHEST PAIN. 75 tablet 1  . pantoprazole (PROTONIX) 40 MG tablet TAKE 1 TABLET BY MOUTH EVERY DAY 90 tablet 1  . triamterene-hydrochlorothiazide (MAXZIDE-25) 37.5-25 MG tablet  TAKE 1 TABLET BY MOUTH EVERY DAY 90 tablet 2  . Wheat Dextrin (BENEFIBER PO) Take by mouth. Daily    . zolpidem (AMBIEN) 10 MG tablet Take 1 tablet (10 mg total) by mouth at bedtime as needed for sleep. 30 tablet 2   No current facility-administered medications for this visit.    LABS/IMAGING: No results found for this or any previous visit (from the past 48 hour(s)). No results found.  VITALS: BP 110/86   Pulse 75   Ht 5\' 6"  (1.676 m)   Wt 199 lb (90.3 kg)   SpO2 99%   BMI 32.12 kg/m   EXAM: General appearance: alert and no distress Neck: no adenopathy, no carotid bruit, no JVD, supple, symmetrical, trachea midline and thyroid not enlarged, symmetric, no tenderness/mass/nodules Lungs: clear to auscultation bilaterally Heart: regular rate and rhythm, S1, S2 normal, no murmur, click, rub or gallop Abdomen: soft, non-tender; bowel sounds normal; no masses,  no organomegaly and obese Extremities: edema 1+ bilaterally and no venous stasis changes or varicose veins Pulses: 2+ and symmetric Skin: Skin color, texture, turgor normal. No rashes or lesions Neurologic: Grossly normal Psych: Pleasant mood, affect  EKG: Normal sinus rhythm at 75, possible left atrial margin-personally reviewed  ASSESSMENT: 1. Coronary disease status post PCI to the distal RCA in 2006 (DES) 2. Obesity 3. Hypertension 4. Dyslipidemia  PLAN: 1.   Mrs. Bosques continues to do well without any chest pain or worsening shortness of breath.  Weight is come down some.  Blood pressure is well managed and cholesterol is very near goal.  No changes in her medicines today.  Follow-up annually or sooner as necessary.  Pixie Casino, MD, FACC, [place] Director of the [facility]  [facility] Attending Cardiologist  Direct Dial: [phone]  Fax: [phone]  Website:  [email or web address]  [place] 07/13/2020, 10:35 AM

## 2020-07-13 NOTE — Patient Instructions (Signed)
Medication Instructions:  Your physician recommends that you continue on your current medications as directed. Please refer to the Current Medication list given to you today.  *If you need a refill on your cardiac medications before your next appointment, please call your pharmacy*  Follow-Up: At [facility], you and your health needs are our priority.  As part of our continuing mission to provide you with exceptional heart care, we have created designated Provider Care Teams.  These Care Teams include your primary Cardiologist (physician) and Advanced Practice Providers (APPs -  Physician Assistants and Nurse Practitioners) who all work together to provide you with the care you need, when you need it.  We recommend signing up for the patient portal called "MyChart".  Sign up information is provided on this After Visit Summary.  MyChart is used to connect with patients for Virtual Visits (Telemedicine).  Patients are able to view lab/test results, encounter notes, upcoming appointments, etc.  Non-urgent messages can be sent to your provider as well.   To learn more about what you can do with MyChart, go to [email or web address].    Your next appointment:   12 month(s)  The format for your next appointment:   In Person  Provider:   You may see Dr. Debara Pickett or one of the following Advanced Practice Providers on your designated Care Team:    Almyra Deforest, PA-C  Fabian Sharp, [place] or   Roby Lofts, [place]    Other Instructions  Quadrivalent High Dose Flu Vaccine

## 2020-07-14 DIAGNOSIS — H2513 Age-related nuclear cataract, bilateral: Secondary | ICD-10-CM | POA: Diagnosis not present

## 2020-07-14 LAB — HM DIABETES EYE EXAM

## 2020-07-20 ENCOUNTER — Ambulatory Visit (INDEPENDENT_AMBULATORY_CARE_PROVIDER_SITE_OTHER): Payer: Medicare Other | Admitting: *Deleted

## 2020-07-20 DIAGNOSIS — J309 Allergic rhinitis, unspecified: Secondary | ICD-10-CM | POA: Diagnosis not present

## 2020-07-24 ENCOUNTER — Other Ambulatory Visit: Payer: Self-pay | Admitting: Family Medicine

## 2020-07-26 NOTE — Telephone Encounter (Signed)
Ok to refill??  Last office visit 04/20/2020.  Last refill 02/16/2020, #2 refills.

## 2020-07-26 NOTE — Progress Notes (Signed)
Vials exp 07-28-21

## 2020-07-28 ENCOUNTER — Ambulatory Visit (INDEPENDENT_AMBULATORY_CARE_PROVIDER_SITE_OTHER): Payer: Medicare Other | Admitting: *Deleted

## 2020-07-28 DIAGNOSIS — J309 Allergic rhinitis, unspecified: Secondary | ICD-10-CM

## 2020-07-29 DIAGNOSIS — J3089 Other allergic rhinitis: Secondary | ICD-10-CM | POA: Diagnosis not present

## 2020-07-30 DIAGNOSIS — J3081 Allergic rhinitis due to animal (cat) (dog) hair and dander: Secondary | ICD-10-CM | POA: Diagnosis not present

## 2020-08-03 ENCOUNTER — Telehealth: Payer: Self-pay

## 2020-08-03 NOTE — Telephone Encounter (Signed)
Patient called to receive results of Bone Density

## 2020-08-05 ENCOUNTER — Ambulatory Visit (INDEPENDENT_AMBULATORY_CARE_PROVIDER_SITE_OTHER): Payer: Medicare Other | Admitting: Sports Medicine

## 2020-08-05 ENCOUNTER — Other Ambulatory Visit: Payer: Self-pay

## 2020-08-05 ENCOUNTER — Encounter: Payer: Self-pay | Admitting: Sports Medicine

## 2020-08-05 DIAGNOSIS — B351 Tinea unguium: Secondary | ICD-10-CM | POA: Diagnosis not present

## 2020-08-05 DIAGNOSIS — I739 Peripheral vascular disease, unspecified: Secondary | ICD-10-CM | POA: Diagnosis not present

## 2020-08-05 DIAGNOSIS — E119 Type 2 diabetes mellitus without complications: Secondary | ICD-10-CM

## 2020-08-05 DIAGNOSIS — M79674 Pain in right toe(s): Secondary | ICD-10-CM

## 2020-08-05 DIAGNOSIS — M79675 Pain in left toe(s): Secondary | ICD-10-CM

## 2020-08-05 NOTE — Progress Notes (Signed)
Subjective: Madeline Wood is a 74 y.o. female patient seen today in office with complaint of mildly painful thickened and elongated toenails; unable to trim.  Last A1c not recorded and does not check her blood sugars  currently monitors like before with a last visit to PCP AC 1C recorded at 5.6. Diet controlled.  Denies any other pedal complaints at this time.  Patient Active Problem List   Diagnosis Date Noted  . Allergic reaction 09/29/2019  . Allergic rhinitis 09/13/2018  . Bilateral high frequency sensorineural hearing loss 01/17/2018  . Subjective tinnitus of left ear 01/17/2018  . Atherosclerosis of native coronary artery of native heart without angina pectoris 07/23/2017  . History of food allergy 01/05/2016  . Angioedema 11/17/2015  . Allergic rhinoconjunctivitis, seasonal and perennial 05/04/2015  . Insomnia 08/24/2014  . Bilateral leg edema 04/17/2014  . Diabetes mellitus without complication ([place])   . Essential hypertension   . Gout   . Mixed hyperlipidemia   . 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 Anxiety   . Obesity   . Knee pain   . Positive TB test   . Hiatal hernia   . GERD (gastroesophageal reflux disease)   . Coronary atherosclerosis 06/06/2010  . GERD 06/06/2010  . History of colonic polyps 06/06/2010    Current Outpatient Medications on File Prior to Visit  Medication Sig Dispense Refill  . albuterol (VENTOLIN HFA) 108 (90 Base) MCG/ACT inhaler Inhale 2 puffs into the lungs every 6 (six) hours as needed for wheezing or shortness of breath. (Patient not taking: Reported on 06/02/2020) 18 g 11  . ALPRAZolam (XANAX) 1 MG tablet TAKE 1 TABLET BY MOUTH TWICE A DAY 60 tablet 2  . aspirin 81 MG tablet Take 81 mg by mouth daily.      [name] atorvastatin (LIPITOR) 80 MG tablet TAKE 1 TABLET BY MOUTH EVERY DAY 90 tablet 1  . cyclobenzaprine (FLEXERIL) 5 MG tablet Take 1 tablet (5 mg total) by mouth 3 (three) times daily as needed for muscle spasms. (Patient not taking: Reported on  06/30/2020) 20 tablet 0  . fexofenadine (ALLEGRA) 180 MG tablet Take 1 tablet (180 mg total) by mouth daily. 30 tablet 5  . fish oil-omega-3 fatty acids 1000 MG capsule Take 1 g by mouth. Takes occasionally (Patient not taking: Reported on 06/30/2020)    . fluticasone (FLONASE) 50 MCG/ACT nasal spray Place 1-2 sprays into both nostrils daily as needed for allergies or rhinitis. 48 g 3  . losartan (COZAAR) 100 MG tablet Take 1 tablet (100 mg total) by mouth daily. 90 tablet 3  . meloxicam (MOBIC) 7.5 MG tablet TAKE 1 TABLET BY MOUTH EVERY DAY (Patient not taking: Reported on 06/02/2020) 30 tablet 3  . metoprolol tartrate (LOPRESSOR) 50 MG tablet TAKE 1 TABLET EVERY MORNING AND 1 AND 1/2 TABLET EVERY EVENING 225 tablet 1  . Misc Natural Products (GLUCOSAMINE CHOND COMPLEX/MSM PO) Take by mouth. Occasionally (Patient not taking: Reported on 06/02/2020)    . montelukast (SINGULAIR) 10 MG tablet TAKE 1 TABLET BY MOUTH EVERYDAY AT BEDTIME (Patient not taking: Reported on 06/30/2020) 90 tablet 1  . Multiple Vitamin (MULTIVITAMIN) tablet Take 1 tablet by mouth. occasionally    . nitroGLYCERIN (NITROSTAT) 0.4 MG SL tablet PLACE 1 TABLET UNDER THE TONGUE EVERY 5 MINUTES AS NEEDED FOR CHEST PAIN. 75 tablet 1  . pantoprazole (PROTONIX) 40 MG tablet TAKE 1 TABLET BY MOUTH EVERY DAY 90 tablet 1  . triamterene-hydrochlorothiazide (MAXZIDE-25) 37.5-25 MG tablet TAKE 1  TABLET BY MOUTH EVERY DAY 90 tablet 2  . Wheat Dextrin (BENEFIBER PO) Take by mouth. Daily    . zolpidem (AMBIEN) 10 MG tablet Take 1 tablet (10 mg total) by mouth at bedtime as needed for sleep. 30 tablet 2  . [DISCONTINUED] omeprazole (PRILOSEC OTC) 20 MG tablet Take 1 tablet (20 mg total) by mouth daily. 30 tablet 1   No current facility-administered medications on file prior to visit.    Allergies  Allergen Reactions  . Ciprofloxacin   . Citalopram Hydrobromide   . Escitalopram Oxalate   . Paroxetine   . Polysporin [Bacitracin-Polymyxin B]      Objective: Physical Exam  General: Well developed, nourished, no acute distress, awake, alert and oriented x 3  Vascular: Dorsalis pedis artery 1/4 bilateral, Posterior tibial artery 1/4 bilateral, skin temperature warm to warm proximal to distal bilateral lower extremities, no varicosities, pedal hair present bilateral.  Neurological: Gross sensation present via light touch bilateral.   Dermatological: Skin is warm, dry, and supple bilateral, Nails 1-10 are tender, long, thick, and discolored with mild subungal debris, no acute ingrowing noted, no webspace macerations present bilateral, no open lesions present bilateral, no callus/corns/hyperkeratotic tissue present bilateral. No signs of infection bilateral.  Musculoskeletal: Asymptomatic pes planus and bunion boney deformities noted bilateral. Muscular strength within normal limits without painon range of motion. No pain with calf compression bilateral.  Assessment and Plan:  Problem List Items Addressed This Visit      Endocrine   Diabetes mellitus without complication ([place])    Other Visit Diagnoses    Pain due to onychomycosis of toenails of both feet    -  Primary   PVD (peripheral vascular disease) ([place])         -Examined patient.  -Re-Discussed treatment options for painful mycotic nails -Mechanically debrided and reduced mycotic nails with sterile nail nipper and dremel nail file without incident.  -Patient to return in 2.5  months for follow up evaluation or sooner if symptoms worsen.  [name], [name] Subjective: Madeline Wood is a 74 y.o. female patient seen today in office with complaint of mildly painful thickened and elongated toenails; unable to trim.  Reports that her toes are doing better after getting her nails trim denies any changes with medication or any pedal complaints at this time.  Last A1c seen with recorded at 5.9 in June 2020 and does not check her blood sugars currently monitors because consider  now prediabetic.  Denies any other pedal complaints at this time.  Patient Active Problem List   Diagnosis Date Noted  . Allergic reaction 09/29/2019  . Allergic rhinitis 09/13/2018  . Bilateral high frequency sensorineural hearing loss 01/17/2018  . Subjective tinnitus of left ear 01/17/2018  . Atherosclerosis of native coronary artery of native heart without angina pectoris 07/23/2017  . History of food allergy 01/05/2016  . Angioedema 11/17/2015  . Allergic rhinoconjunctivitis, seasonal and perennial 05/04/2015  . Insomnia 08/24/2014  . Bilateral leg edema 04/17/2014  . Diabetes mellitus without complication ([place])   . Essential hypertension   . Gout   . Mixed hyperlipidemia   . 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 Anxiety   . Obesity   . Knee pain   . Positive TB test   . Hiatal hernia   . GERD (gastroesophageal reflux disease)   . Coronary atherosclerosis 06/06/2010  . GERD 06/06/2010  . History of colonic polyps 06/06/2010    Current Outpatient Medications on File Prior to Visit  Medication Sig Dispense Refill  . albuterol (VENTOLIN HFA) 108 (90 Base) MCG/ACT inhaler Inhale 2 puffs into the lungs every 6 (six) hours as needed for wheezing or shortness of breath. (Patient not taking: Reported on 06/02/2020) 18 g 11  . ALPRAZolam (XANAX) 1 MG tablet TAKE 1 TABLET BY MOUTH TWICE A DAY 60 tablet 2  . aspirin 81 MG tablet Take 81 mg by mouth daily.      [name] atorvastatin (LIPITOR) 80 MG tablet TAKE 1 TABLET BY MOUTH EVERY DAY 90 tablet 1  . cyclobenzaprine (FLEXERIL) 5 MG tablet Take 1 tablet (5 mg total) by mouth 3 (three) times daily as needed for muscle spasms. (Patient not taking: Reported on 06/30/2020) 20 tablet 0  . fexofenadine (ALLEGRA) 180 MG tablet Take 1 tablet (180 mg total) by mouth daily. 30 tablet 5  . fish oil-omega-3 fatty acids 1000 MG capsule Take 1 g by mouth. Takes occasionally (Patient not taking: Reported on 06/30/2020)    . fluticasone (FLONASE) 50 MCG/ACT nasal spray  Place 1-2 sprays into both nostrils daily as needed for allergies or rhinitis. 48 g 3  . losartan (COZAAR) 100 MG tablet Take 1 tablet (100 mg total) by mouth daily. 90 tablet 3  . meloxicam (MOBIC) 7.5 MG tablet TAKE 1 TABLET BY MOUTH EVERY DAY (Patient not taking: Reported on 06/02/2020) 30 tablet 3  . metoprolol tartrate (LOPRESSOR) 50 MG tablet TAKE 1 TABLET EVERY MORNING AND 1 AND 1/2 TABLET EVERY EVENING 225 tablet 1  . Misc Natural Products (GLUCOSAMINE CHOND COMPLEX/MSM PO) Take by mouth. Occasionally (Patient not taking: Reported on 06/02/2020)    . montelukast (SINGULAIR) 10 MG tablet TAKE 1 TABLET BY MOUTH EVERYDAY AT BEDTIME (Patient not taking: Reported on 06/30/2020) 90 tablet 1  . Multiple Vitamin (MULTIVITAMIN) tablet Take 1 tablet by mouth. occasionally    . nitroGLYCERIN (NITROSTAT) 0.4 MG SL tablet PLACE 1 TABLET UNDER THE TONGUE EVERY 5 MINUTES AS NEEDED FOR CHEST PAIN. 75 tablet 1  . pantoprazole (PROTONIX) 40 MG tablet TAKE 1 TABLET BY MOUTH EVERY DAY 90 tablet 1  . triamterene-hydrochlorothiazide (MAXZIDE-25) 37.5-25 MG tablet TAKE 1 TABLET BY MOUTH EVERY DAY 90 tablet 2  . Wheat Dextrin (BENEFIBER PO) Take by mouth. Daily    . zolpidem (AMBIEN) 10 MG tablet Take 1 tablet (10 mg total) by mouth at bedtime as needed for sleep. 30 tablet 2  . [DISCONTINUED] omeprazole (PRILOSEC OTC) 20 MG tablet Take 1 tablet (20 mg total) by mouth daily. 30 tablet 1   No current facility-administered medications on file prior to visit.    Allergies  Allergen Reactions  . Ciprofloxacin   . Citalopram Hydrobromide   . Escitalopram Oxalate   . Paroxetine   . Polysporin [Bacitracin-Polymyxin B]     Objective: Physical Exam  General: Well developed, nourished, no acute distress, awake, alert and oriented x 3  Vascular: Dorsalis pedis artery 1/4 bilateral, Posterior tibial artery 1/4 bilateral, skin temperature warm to warm proximal to distal bilateral lower extremities, no varicosities,  pedal hair present bilateral.  Neurological: Gross sensation present via light touch bilateral.   Dermatological: Skin is warm, dry, and supple bilateral, Nails 1-10 are tender, long, thick, and discolored with mild subungal debris, no acute ingrowing noted, no webspace macerations present bilateral, no open lesions present bilateral, no callus/corns/hyperkeratotic tissue present bilateral. No signs of infection bilateral.  Musculoskeletal: Asymptomatic pes planus and bunion boney deformities noted bilateral. Muscular strength within normal limits without painon range of  motion. No pain with calf compression bilateral.  Assessment and Plan:  Problem List Items Addressed This Visit      Endocrine   Diabetes mellitus without complication ([place])    Other Visit Diagnoses    Pain due to onychomycosis of toenails of both feet    -  Primary   PVD (peripheral vascular disease) ([place])         -Examined patient.  -Re-Discussed treatment options for painful mycotic nails -Mechanically debrided and reduced mycotic nails with sterile nail nipper and dremel nail file without incident.  -Patient to return in 2.5  months for follow up evaluation or sooner if symptoms worsen.  [name], [name] Subjective: Madeline Wood is a 74 y.o. female patient seen today in office with complaint of mildly painful thickened and elongated toenails; unable to trim.  Reports that her toes are doing better after getting her nails trim denies any changes with medication or any pedal complaints at this time.  Last A1c seen with recorded at 5.9 in June 2020 and does not check her blood sugars currently monitors because consider now prediabetic.  Denies any other pedal complaints at this time.  Patient Active Problem List   Diagnosis Date Noted  . Allergic reaction 09/29/2019  . Allergic rhinitis 09/13/2018  . Bilateral high frequency sensorineural hearing loss 01/17/2018  . Subjective tinnitus of left ear 01/17/2018  .  Atherosclerosis of native coronary artery of native heart without angina pectoris 07/23/2017  . History of food allergy 01/05/2016  . Angioedema 11/17/2015  . Allergic rhinoconjunctivitis, seasonal and perennial 05/04/2015  . Insomnia 08/24/2014  . Bilateral leg edema 04/17/2014  . Diabetes mellitus without complication ([place])   . Essential hypertension   . Gout   . Mixed hyperlipidemia   . 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 Anxiety   . Obesity   . Knee pain   . Positive TB test   . Hiatal hernia   . GERD (gastroesophageal reflux disease)   . Coronary atherosclerosis 06/06/2010  . GERD 06/06/2010  . History of colonic polyps 06/06/2010    Current Outpatient Medications on File Prior to Visit  Medication Sig Dispense Refill  . albuterol (VENTOLIN HFA) 108 (90 Base) MCG/ACT inhaler Inhale 2 puffs into the lungs every 6 (six) hours as needed for wheezing or shortness of breath. (Patient not taking: Reported on 06/02/2020) 18 g 11  . ALPRAZolam (XANAX) 1 MG tablet TAKE 1 TABLET BY MOUTH TWICE A DAY 60 tablet 2  . aspirin 81 MG tablet Take 81 mg by mouth daily.      [name] atorvastatin (LIPITOR) 80 MG tablet TAKE 1 TABLET BY MOUTH EVERY DAY 90 tablet 1  . cyclobenzaprine (FLEXERIL) 5 MG tablet Take 1 tablet (5 mg total) by mouth 3 (three) times daily as needed for muscle spasms. (Patient not taking: Reported on 06/30/2020) 20 tablet 0  . fexofenadine (ALLEGRA) 180 MG tablet Take 1 tablet (180 mg total) by mouth daily. 30 tablet 5  . fish oil-omega-3 fatty acids 1000 MG capsule Take 1 g by mouth. Takes occasionally (Patient not taking: Reported on 06/30/2020)    . fluticasone (FLONASE) 50 MCG/ACT nasal spray Place 1-2 sprays into both nostrils daily as needed for allergies or rhinitis. 48 g 3  . losartan (COZAAR) 100 MG tablet Take 1 tablet (100 mg total) by mouth daily. 90 tablet 3  . meloxicam (MOBIC) 7.5 MG tablet TAKE 1 TABLET BY MOUTH EVERY DAY (Patient not taking: Reported on 06/02/2020)  30 tablet 3  .  metoprolol tartrate (LOPRESSOR) 50 MG tablet TAKE 1 TABLET EVERY MORNING AND 1 AND 1/2 TABLET EVERY EVENING 225 tablet 1  . Misc Natural Products (GLUCOSAMINE CHOND COMPLEX/MSM PO) Take by mouth. Occasionally (Patient not taking: Reported on 06/02/2020)    . montelukast (SINGULAIR) 10 MG tablet TAKE 1 TABLET BY MOUTH EVERYDAY AT BEDTIME (Patient not taking: Reported on 06/30/2020) 90 tablet 1  . Multiple Vitamin (MULTIVITAMIN) tablet Take 1 tablet by mouth. occasionally    . nitroGLYCERIN (NITROSTAT) 0.4 MG SL tablet PLACE 1 TABLET UNDER THE TONGUE EVERY 5 MINUTES AS NEEDED FOR CHEST PAIN. 75 tablet 1  . pantoprazole (PROTONIX) 40 MG tablet TAKE 1 TABLET BY MOUTH EVERY DAY 90 tablet 1  . triamterene-hydrochlorothiazide (MAXZIDE-25) 37.5-25 MG tablet TAKE 1 TABLET BY MOUTH EVERY DAY 90 tablet 2  . Wheat Dextrin (BENEFIBER PO) Take by mouth. Daily    . zolpidem (AMBIEN) 10 MG tablet Take 1 tablet (10 mg total) by mouth at bedtime as needed for sleep. 30 tablet 2  . [DISCONTINUED] omeprazole (PRILOSEC OTC) 20 MG tablet Take 1 tablet (20 mg total) by mouth daily. 30 tablet 1   No current facility-administered medications on file prior to visit.    Allergies  Allergen Reactions  . Ciprofloxacin   . Citalopram Hydrobromide   . Escitalopram Oxalate   . Paroxetine   . Polysporin [Bacitracin-Polymyxin B]     Objective: Physical Exam  General: Well developed, nourished, no acute distress, awake, alert and oriented x 3  Vascular: Dorsalis pedis artery 1/4 bilateral, Posterior tibial artery 1/4 bilateral, skin temperature warm to warm proximal to distal bilateral lower extremities, no varicosities, pedal hair present bilateral.  Neurological: Gross sensation present via light touch bilateral.   Dermatological: Skin is warm, dry, and supple bilateral, Nails 1-10 are tender, long, thick, and discolored with mild subungal debris, no acute ingrowing noted, no webspace macerations present bilateral, no  open lesions present bilateral, no callus/corns/hyperkeratotic tissue present bilateral. No signs of infection bilateral.  Musculoskeletal: Asymptomatic pes planus and bunion boney deformities noted bilateral. Muscular strength within normal limits without painon range of motion. No pain with calf compression bilateral.  Assessment and Plan:  Problem List Items Addressed This Visit      Endocrine   Diabetes mellitus without complication ([place])    Other Visit Diagnoses    Pain due to onychomycosis of toenails of both feet    -  Primary   PVD (peripheral vascular disease) ([place])         -Examined patient.  -Re-Discussed treatment options for painful mycotic nails like previous -Mechanically debrided and reduced mycotic nails with sterile nail nipper and dremel nail file without incident.  -[employer] step diabetic shoe order form was completed; office to contact primary care for approval / certification;  Office to arrange shoe fitting and dispensing. -Patient to return in 2.5  months for follow up evaluation or sooner if symptoms worsen.  [name], [name]

## 2020-08-11 ENCOUNTER — Ambulatory Visit (INDEPENDENT_AMBULATORY_CARE_PROVIDER_SITE_OTHER): Payer: Medicare Other

## 2020-08-11 DIAGNOSIS — J309 Allergic rhinitis, unspecified: Secondary | ICD-10-CM | POA: Diagnosis not present

## 2020-08-18 ENCOUNTER — Ambulatory Visit (INDEPENDENT_AMBULATORY_CARE_PROVIDER_SITE_OTHER): Payer: Medicare Other

## 2020-08-18 DIAGNOSIS — J309 Allergic rhinitis, unspecified: Secondary | ICD-10-CM

## 2020-08-25 ENCOUNTER — Ambulatory Visit (INDEPENDENT_AMBULATORY_CARE_PROVIDER_SITE_OTHER): Payer: Medicare Other

## 2020-08-25 DIAGNOSIS — J309 Allergic rhinitis, unspecified: Secondary | ICD-10-CM

## 2020-09-06 ENCOUNTER — Ambulatory Visit (INDEPENDENT_AMBULATORY_CARE_PROVIDER_SITE_OTHER): Payer: Medicare Other

## 2020-09-06 DIAGNOSIS — J309 Allergic rhinitis, unspecified: Secondary | ICD-10-CM

## 2020-09-08 ENCOUNTER — Other Ambulatory Visit: Payer: Self-pay | Admitting: Family Medicine

## 2020-09-08 DIAGNOSIS — K219 Gastro-esophageal reflux disease without esophagitis: Secondary | ICD-10-CM

## 2020-09-23 ENCOUNTER — Ambulatory Visit (INDEPENDENT_AMBULATORY_CARE_PROVIDER_SITE_OTHER): Payer: Medicare Other

## 2020-09-23 DIAGNOSIS — J309 Allergic rhinitis, unspecified: Secondary | ICD-10-CM | POA: Diagnosis not present

## 2020-10-05 ENCOUNTER — Ambulatory Visit (INDEPENDENT_AMBULATORY_CARE_PROVIDER_SITE_OTHER): Payer: Medicare Other | Admitting: *Deleted

## 2020-10-05 DIAGNOSIS — J309 Allergic rhinitis, unspecified: Secondary | ICD-10-CM | POA: Diagnosis not present

## 2020-10-06 ENCOUNTER — Telehealth: Payer: Self-pay | Admitting: Pharmacist

## 2020-10-06 NOTE — Progress Notes (Addendum)
Chronic Care Management Pharmacy Assistant   Name: Madeline Wood  MRN: [id number] DOB: 02-27-1946  Reason for Encounter: General Disease State Call  Patient Questions:  1.  Have you seen any other providers since your last visit? Yes.   2.  Any changes in your medicines or health? No.   PCP : [name], MD   Their chronic conditions include: HTN, GERD, Diabetes, HLD, Anxiety, insomnia.  Office Visits: None since 06/30/20  Consults: 08/05/20 Podiatry Cannon Kettle, [place], [name]. Per note: Re-Discussed treatment options for painful mycotic nails -Mechanically debrided and reduced mycotic nails with sterile nail nipper and dremel nail file without incident. Return in 2.5 months. No medication changes. 07/13/20 Cardiology Hilty, Nadean Corwin, MD.  Per note: Mrs. Zuver continues to do well without any chest pain or worsening shortness of breath.  Weight is come down some.  Blood pressure is well managed and cholesterol is very near goal. No medication changes.  Allergies:   Allergies  Allergen Reactions   Ciprofloxacin    Citalopram Hydrobromide    Escitalopram Oxalate    Paroxetine    Polysporin [Bacitracin-Polymyxin B]     Medications: Outpatient Encounter Medications as of 10/06/2020  Medication Sig   albuterol (VENTOLIN HFA) 108 (90 Base) MCG/ACT inhaler Inhale 2 puffs into the lungs every 6 (six) hours as needed for wheezing or shortness of breath. (Patient not taking: Reported on 06/02/2020)   ALPRAZolam (XANAX) 1 MG tablet TAKE 1 TABLET BY MOUTH TWICE A DAY   aspirin 81 MG tablet Take 81 mg by mouth daily.     atorvastatin (LIPITOR) 80 MG tablet TAKE 1 TABLET BY MOUTH EVERY DAY   cyclobenzaprine (FLEXERIL) 5 MG tablet Take 1 tablet (5 mg total) by mouth 3 (three) times daily as needed for muscle spasms. (Patient not taking: Reported on 06/30/2020)   fexofenadine (ALLEGRA) 180 MG tablet Take 1 tablet (180 mg total) by mouth daily.   fish oil-omega-3 fatty acids 1000 MG  capsule Take 1 g by mouth. Takes occasionally (Patient not taking: Reported on 06/30/2020)   fluticasone (FLONASE) 50 MCG/ACT nasal spray Place 1-2 sprays into both nostrils daily as needed for allergies or rhinitis.   losartan (COZAAR) 100 MG tablet Take 1 tablet (100 mg total) by mouth daily.   meloxicam (MOBIC) 7.5 MG tablet TAKE 1 TABLET BY MOUTH EVERY DAY   metoprolol tartrate (LOPRESSOR) 50 MG tablet TAKE 1 TABLET EVERY MORNING AND 1 AND 1/2 TABLET EVERY EVENING   Misc Natural Products (GLUCOSAMINE CHOND COMPLEX/MSM PO) Take by mouth. Occasionally (Patient not taking: Reported on 06/02/2020)   montelukast (SINGULAIR) 10 MG tablet TAKE 1 TABLET BY MOUTH EVERYDAY AT BEDTIME (Patient not taking: Reported on 06/30/2020)   Multiple Vitamin (MULTIVITAMIN) tablet Take 1 tablet by mouth. occasionally   nitroGLYCERIN (NITROSTAT) 0.4 MG SL tablet PLACE 1 TABLET UNDER THE TONGUE EVERY 5 MINUTES AS NEEDED FOR CHEST PAIN.   pantoprazole (PROTONIX) 40 MG tablet TAKE 1 TABLET BY MOUTH EVERY DAY   triamterene-hydrochlorothiazide (MAXZIDE-25) 37.5-25 MG tablet TAKE 1 TABLET BY MOUTH EVERY DAY   Wheat Dextrin (BENEFIBER PO) Take by mouth. Daily   zolpidem (AMBIEN) 10 MG tablet Take 1 tablet (10 mg total) by mouth at bedtime as needed for sleep.   [DISCONTINUED] omeprazole (PRILOSEC OTC) 20 MG tablet Take 1 tablet (20 mg total) by mouth daily.   No facility-administered encounter medications on file as of 10/06/2020.    Current Diagnosis: Patient Active Problem List  Diagnosis Date Noted   Allergic reaction 09/29/2019   Allergic rhinitis 09/13/2018   Bilateral high frequency sensorineural hearing loss 01/17/2018   Subjective tinnitus of left ear 01/17/2018   Atherosclerosis of native coronary artery of native heart without angina pectoris 07/23/2017   History of food allergy 01/05/2016   Angioedema 11/17/2015   Allergic rhinoconjunctivitis, seasonal and perennial 05/04/2015   Insomnia 08/24/2014    Bilateral leg edema 04/17/2014   Diabetes mellitus without complication ([place])    Essential hypertension    Gout    Mixed hyperlipidemia    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    Follow-Up:  Pharmacist Review   Charlann Lange, RMA Clinical Pharmacist Assistant [phone]  4 minutes spent in review, coordination, and documentation.  Reviewed by: Beverly Milch, PharmD Clinical Pharmacist [facility] [phone]

## 2020-10-08 ENCOUNTER — Other Ambulatory Visit: Payer: Self-pay | Admitting: Internal Medicine

## 2020-10-13 ENCOUNTER — Ambulatory Visit (INDEPENDENT_AMBULATORY_CARE_PROVIDER_SITE_OTHER): Payer: Medicare Other | Admitting: *Deleted

## 2020-10-13 DIAGNOSIS — J309 Allergic rhinitis, unspecified: Secondary | ICD-10-CM

## 2020-10-26 ENCOUNTER — Other Ambulatory Visit: Payer: Self-pay | Admitting: Internal Medicine

## 2020-10-28 ENCOUNTER — Ambulatory Visit (INDEPENDENT_AMBULATORY_CARE_PROVIDER_SITE_OTHER): Payer: Medicare Other

## 2020-10-28 DIAGNOSIS — J309 Allergic rhinitis, unspecified: Secondary | ICD-10-CM | POA: Diagnosis not present

## 2020-11-01 ENCOUNTER — Other Ambulatory Visit: Payer: Self-pay | Admitting: Family Medicine

## 2020-11-04 ENCOUNTER — Ambulatory Visit (INDEPENDENT_AMBULATORY_CARE_PROVIDER_SITE_OTHER): Payer: Medicare Other | Admitting: Sports Medicine

## 2020-11-04 ENCOUNTER — Encounter: Payer: Self-pay | Admitting: Sports Medicine

## 2020-11-04 ENCOUNTER — Other Ambulatory Visit: Payer: Self-pay

## 2020-11-04 DIAGNOSIS — B351 Tinea unguium: Secondary | ICD-10-CM | POA: Diagnosis not present

## 2020-11-04 DIAGNOSIS — M79674 Pain in right toe(s): Secondary | ICD-10-CM

## 2020-11-04 DIAGNOSIS — E119 Type 2 diabetes mellitus without complications: Secondary | ICD-10-CM | POA: Diagnosis not present

## 2020-11-04 DIAGNOSIS — I739 Peripheral vascular disease, unspecified: Secondary | ICD-10-CM

## 2020-11-04 DIAGNOSIS — M79675 Pain in left toe(s): Secondary | ICD-10-CM

## 2020-11-04 DIAGNOSIS — Z9229 Personal history of other drug therapy: Secondary | ICD-10-CM

## 2020-11-04 NOTE — Progress Notes (Signed)
Subjective: Madeline Wood is a 75 y.o. female patient seen today in office with complaint of mildly painful thickened and elongated toenails; unable to trim.  Last A1c not recorded and does not check her blood sugars diet controlled with no new issues.  Patient Active Problem List   Diagnosis Date Noted  . Allergic reaction 09/29/2019  . Allergic rhinitis 09/13/2018  . Bilateral high frequency sensorineural hearing loss 01/17/2018  . Subjective tinnitus of left ear 01/17/2018  . Atherosclerosis of native coronary artery of native heart without angina pectoris 07/23/2017  . History of food allergy 01/05/2016  . Angioedema 11/17/2015  . Allergic rhinoconjunctivitis, seasonal and perennial 05/04/2015  . Insomnia 08/24/2014  . Bilateral leg edema 04/17/2014  . Diabetes mellitus without complication ([place])   . Essential hypertension   . Gout   . Mixed hyperlipidemia   . 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 Anxiety   . Obesity   . Knee pain   . Positive TB test   . Hiatal hernia   . GERD (gastroesophageal reflux disease)   . Coronary atherosclerosis 06/06/2010  . GERD 06/06/2010  . History of colonic polyps 06/06/2010    Current Outpatient Medications on File Prior to Visit  Medication Sig Dispense Refill  . albuterol (VENTOLIN HFA) 108 (90 Base) MCG/ACT inhaler Inhale 2 puffs into the lungs every 6 (six) hours as needed for wheezing or shortness of breath. 18 g 11  . ALPRAZolam (XANAX) 1 MG tablet TAKE 1 TABLET BY MOUTH TWICE A DAY 60 tablet 2  . aspirin 81 MG tablet Take 81 mg by mouth daily.    [name] atorvastatin (LIPITOR) 80 MG tablet TAKE 1 TABLET BY MOUTH EVERY DAY 90 tablet 2  . cyclobenzaprine (FLEXERIL) 5 MG tablet Take 1 tablet (5 mg total) by mouth 3 (three) times daily as needed for muscle spasms. 20 tablet 0  . fexofenadine (ALLEGRA) 180 MG tablet Take 1 tablet (180 mg total) by mouth daily. 30 tablet 5  . fish oil-omega-3 fatty acids 1000 MG capsule Take 1 g by mouth. Takes  occasionally    . fluticasone (FLONASE) 50 MCG/ACT nasal spray Place 1-2 sprays into both nostrils daily as needed for allergies or rhinitis. 48 g 3  . losartan (COZAAR) 100 MG tablet Take 1 tablet (100 mg total) by mouth daily. 90 tablet 3  . losartan (COZAAR) 25 MG tablet Take by mouth.    . meloxicam (MOBIC) 7.5 MG tablet TAKE 1 TABLET BY MOUTH EVERY DAY 30 tablet 3  . metoprolol tartrate (LOPRESSOR) 50 MG tablet TAKE 1 TABLET EVERY MORNING AND 1 AND 1/2 TABLET EVERY EVENING 225 tablet 1  . Metoprolol-Hydrochlorothiazide 100-12.5 MG TB24 metoprolol suc 100 mg-hydrochlorothiazide 12.5 mg tablet,ext.rel 24 hr  Take 1 tablet every day by oral route.    . Misc Natural Products (GLUCOSAMINE CHOND COMPLEX/MSM PO) Take by mouth. Occasionally    . montelukast (SINGULAIR) 10 MG tablet TAKE 1 TABLET BY MOUTH EVERYDAY AT BEDTIME 90 tablet 1  . Multiple Vitamin (MULTIVITAMIN) tablet Take 1 tablet by mouth. occasionally    . nitroGLYCERIN (NITROSTAT) 0.4 MG SL tablet PLACE 1 TABLET UNDER THE TONGUE EVERY 5 MINUTES AS NEEDED FOR CHEST PAIN. 75 tablet 1  . pantoprazole (PROTONIX) 40 MG tablet TAKE 1 TABLET BY MOUTH EVERY DAY 90 tablet 1  . triamterene-hydrochlorothiazide (MAXZIDE-25) 37.5-25 MG tablet TAKE 1 TABLET BY MOUTH EVERY DAY 90 tablet 2  . Wheat Dextrin (BENEFIBER PO) Take by mouth. Daily    .  zolpidem (AMBIEN) 10 MG tablet TAKE 1 TABLET BY MOUTH AT BEDTIME AS NEEDED FOR SLEEP. 30 tablet 0  . [DISCONTINUED] omeprazole (PRILOSEC OTC) 20 MG tablet Take 1 tablet (20 mg total) by mouth daily. 30 tablet 1   No current facility-administered medications on file prior to visit.    Allergies  Allergen Reactions  . Ciprofloxacin   . Citalopram Hydrobromide   . Escitalopram Oxalate   . Paroxetine   . Polysporin [Bacitracin-Polymyxin B]     Objective: Physical Exam  General: Well developed, nourished, no acute distress, awake, alert and oriented x 3  Vascular: Dorsalis pedis artery 1/4 bilateral,  Posterior tibial artery 1/4 bilateral, skin temperature warm to warm proximal to distal bilateral lower extremities, no varicosities, pedal hair present bilateral.  Neurological: Gross sensation present via light touch bilateral.   Dermatological: Skin is warm, dry, and supple bilateral, Nails 1-10 are tender, long, thick, and discolored with mild subungal debris, no acute ingrowing noted, no webspace macerations present bilateral, no open lesions present bilateral, no callus/corns/hyperkeratotic tissue present bilateral. No signs of infection bilateral.  Musculoskeletal: Asymptomatic pes planus and bunion boney deformities noted bilateral. Muscular strength within normal limits without painon range of motion. No pain with calf compression bilateral.  Assessment and Plan:  Problem List Items Addressed This Visit      Endocrine   Diabetes mellitus without complication (HCC)   Relevant Medications   losartan (COZAAR) 25 MG tablet   Metoprolol-Hydrochlorothiazide 100-12.5 MG TB24    Other Visit Diagnoses    Pain due to onychomycosis of toenails of both feet    -  Primary   PVD (peripheral vascular disease) (HCC)       Relevant Medications   losartan (COZAAR) 25 MG tablet   Metoprolol-Hydrochlorothiazide 100-12.5 MG TB24   HX: long term anticoagulant use         -Examined patient.  -Re-Discussed treatment options for painful mycotic nails -Mechanically debrided and reduced mycotic nails with sterile nail nipper and dremel nail file without incident.  -Patient to return in 2.5  months for follow up evaluation or sooner if symptoms worsen.  [name], [name] Subjective: Madeline Wood is a 75 y.o. female patient seen today in office with complaint of mildly painful thickened and elongated toenails; unable to trim.  Reports that her toes are doing better after getting her nails trim denies any changes with medication or any pedal complaints at this time.  Last A1c seen with recorded at  5.9 in June 2020 and does not check her blood sugars currently monitors because consider now prediabetic.  Denies any other pedal complaints at this time.  Patient Active Problem List   Diagnosis Date Noted  . Allergic reaction 09/29/2019  . Allergic rhinitis 09/13/2018  . Bilateral high frequency sensorineural hearing loss 01/17/2018  . Subjective tinnitus of left ear 01/17/2018  . Atherosclerosis of native coronary artery of native heart without angina pectoris 07/23/2017  . History of food allergy 01/05/2016  . Angioedema 11/17/2015  . Allergic rhinoconjunctivitis, seasonal and perennial 05/04/2015  . Insomnia 08/24/2014  . Bilateral leg edema 04/17/2014  . Diabetes mellitus without complication ([place])   . Essential hypertension   . Gout   . Mixed hyperlipidemia   . 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 Anxiety   . Obesity   . Knee pain   . Positive TB test   . Hiatal hernia   . GERD (gastroesophageal reflux disease)   . Coronary atherosclerosis 06/06/2010  .  GERD 06/06/2010  . History of colonic polyps 06/06/2010    Current Outpatient Medications on File Prior to Visit  Medication Sig Dispense Refill  . albuterol (VENTOLIN HFA) 108 (90 Base) MCG/ACT inhaler Inhale 2 puffs into the lungs every 6 (six) hours as needed for wheezing or shortness of breath. 18 g 11  . ALPRAZolam (XANAX) 1 MG tablet TAKE 1 TABLET BY MOUTH TWICE A DAY 60 tablet 2  . aspirin 81 MG tablet Take 81 mg by mouth daily.    [name] atorvastatin (LIPITOR) 80 MG tablet TAKE 1 TABLET BY MOUTH EVERY DAY 90 tablet 2  . cyclobenzaprine (FLEXERIL) 5 MG tablet Take 1 tablet (5 mg total) by mouth 3 (three) times daily as needed for muscle spasms. 20 tablet 0  . fexofenadine (ALLEGRA) 180 MG tablet Take 1 tablet (180 mg total) by mouth daily. 30 tablet 5  . fish oil-omega-3 fatty acids 1000 MG capsule Take 1 g by mouth. Takes occasionally    . fluticasone (FLONASE) 50 MCG/ACT nasal spray Place 1-2 sprays into both nostrils daily as  needed for allergies or rhinitis. 48 g 3  . losartan (COZAAR) 100 MG tablet Take 1 tablet (100 mg total) by mouth daily. 90 tablet 3  . losartan (COZAAR) 25 MG tablet Take by mouth.    . meloxicam (MOBIC) 7.5 MG tablet TAKE 1 TABLET BY MOUTH EVERY DAY 30 tablet 3  . metoprolol tartrate (LOPRESSOR) 50 MG tablet TAKE 1 TABLET EVERY MORNING AND 1 AND 1/2 TABLET EVERY EVENING 225 tablet 1  . Metoprolol-Hydrochlorothiazide 100-12.5 MG TB24 metoprolol suc 100 mg-hydrochlorothiazide 12.5 mg tablet,ext.rel 24 hr  Take 1 tablet every day by oral route.    . Misc Natural Products (GLUCOSAMINE CHOND COMPLEX/MSM PO) Take by mouth. Occasionally    . montelukast (SINGULAIR) 10 MG tablet TAKE 1 TABLET BY MOUTH EVERYDAY AT BEDTIME 90 tablet 1  . Multiple Vitamin (MULTIVITAMIN) tablet Take 1 tablet by mouth. occasionally    . nitroGLYCERIN (NITROSTAT) 0.4 MG SL tablet PLACE 1 TABLET UNDER THE TONGUE EVERY 5 MINUTES AS NEEDED FOR CHEST PAIN. 75 tablet 1  . pantoprazole (PROTONIX) 40 MG tablet TAKE 1 TABLET BY MOUTH EVERY DAY 90 tablet 1  . triamterene-hydrochlorothiazide (MAXZIDE-25) 37.5-25 MG tablet TAKE 1 TABLET BY MOUTH EVERY DAY 90 tablet 2  . Wheat Dextrin (BENEFIBER PO) Take by mouth. Daily    . zolpidem (AMBIEN) 10 MG tablet TAKE 1 TABLET BY MOUTH AT BEDTIME AS NEEDED FOR SLEEP. 30 tablet 0  . [DISCONTINUED] omeprazole (PRILOSEC OTC) 20 MG tablet Take 1 tablet (20 mg total) by mouth daily. 30 tablet 1   No current facility-administered medications on file prior to visit.    Allergies  Allergen Reactions  . Ciprofloxacin   . Citalopram Hydrobromide   . Escitalopram Oxalate   . Paroxetine   . Polysporin [Bacitracin-Polymyxin B]     Objective: Physical Exam  General: Well developed, nourished, no acute distress, awake, alert and oriented x 3  Vascular: Dorsalis pedis artery 1/4 bilateral, Posterior tibial artery 1/4 bilateral, skin temperature warm to warm proximal to distal bilateral lower  extremities, no varicosities, pedal hair present bilateral.  Neurological: Gross sensation present via light touch bilateral.   Dermatological: Skin is warm, dry, and supple bilateral, Nails 1-10 are tender, long, thick, and discolored with mild subungal debris, no acute ingrowing noted, no webspace macerations present bilateral, no open lesions present bilateral, no callus/corns/hyperkeratotic tissue present bilateral. No signs of infection bilateral.  Musculoskeletal:  Asymptomatic pes planus and bunion boney deformities noted bilateral. Muscular strength within normal limits without painon range of motion. No pain with calf compression bilateral.  Assessment and Plan:  Problem List Items Addressed This Visit      Endocrine   Diabetes mellitus without complication (HCC)   Relevant Medications   losartan (COZAAR) 25 MG tablet   Metoprolol-Hydrochlorothiazide 100-12.5 MG TB24    Other Visit Diagnoses    Pain due to onychomycosis of toenails of both feet    -  Primary   PVD (peripheral vascular disease) (HCC)       Relevant Medications   losartan (COZAAR) 25 MG tablet   Metoprolol-Hydrochlorothiazide 100-12.5 MG TB24   HX: long term anticoagulant use         -Examined patient.  -Re-Discussed treatment options for painful mycotic nails -Mechanically debrided and reduced mycotic nails with sterile nail nipper and dremel nail file without incident.  -Patient to return in 2.5  months for follow up evaluation or sooner if symptoms worsen.  [name], [name] Subjective: Madeline Wood is a 75 y.o. female patient seen today in office with complaint of mildly painful thickened and elongated toenails; unable to trim.  Reports that her toes are doing better after getting her nails trim denies any changes with medication or any pedal complaints at this time.  Last A1c seen with recorded at 5.9 in June 2020 and does not check her blood sugars currently monitors because consider now prediabetic.   Denies any other pedal complaints at this time.  Patient Active Problem List   Diagnosis Date Noted  . Allergic reaction 09/29/2019  . Allergic rhinitis 09/13/2018  . Bilateral high frequency sensorineural hearing loss 01/17/2018  . Subjective tinnitus of left ear 01/17/2018  . Atherosclerosis of native coronary artery of native heart without angina pectoris 07/23/2017  . History of food allergy 01/05/2016  . Angioedema 11/17/2015  . Allergic rhinoconjunctivitis, seasonal and perennial 05/04/2015  . Insomnia 08/24/2014  . Bilateral leg edema 04/17/2014  . Diabetes mellitus without complication ([place])   . Essential hypertension   . Gout   . Mixed hyperlipidemia   . 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 Anxiety   . Obesity   . Knee pain   . Positive TB test   . Hiatal hernia   . GERD (gastroesophageal reflux disease)   . Coronary atherosclerosis 06/06/2010  . GERD 06/06/2010  . History of colonic polyps 06/06/2010    Current Outpatient Medications on File Prior to Visit  Medication Sig Dispense Refill  . albuterol (VENTOLIN HFA) 108 (90 Base) MCG/ACT inhaler Inhale 2 puffs into the lungs every 6 (six) hours as needed for wheezing or shortness of breath. 18 g 11  . ALPRAZolam (XANAX) 1 MG tablet TAKE 1 TABLET BY MOUTH TWICE A DAY 60 tablet 2  . aspirin 81 MG tablet Take 81 mg by mouth daily.    [name] atorvastatin (LIPITOR) 80 MG tablet TAKE 1 TABLET BY MOUTH EVERY DAY 90 tablet 2  . cyclobenzaprine (FLEXERIL) 5 MG tablet Take 1 tablet (5 mg total) by mouth 3 (three) times daily as needed for muscle spasms. 20 tablet 0  . fexofenadine (ALLEGRA) 180 MG tablet Take 1 tablet (180 mg total) by mouth daily. 30 tablet 5  . fish oil-omega-3 fatty acids 1000 MG capsule Take 1 g by mouth. Takes occasionally    . fluticasone (FLONASE) 50 MCG/ACT nasal spray Place 1-2 sprays into both nostrils daily as needed for allergies or  rhinitis. 48 g 3  . losartan (COZAAR) 100 MG tablet Take 1 tablet (100 mg total) by  mouth daily. 90 tablet 3  . losartan (COZAAR) 25 MG tablet Take by mouth.    . meloxicam (MOBIC) 7.5 MG tablet TAKE 1 TABLET BY MOUTH EVERY DAY 30 tablet 3  . metoprolol tartrate (LOPRESSOR) 50 MG tablet TAKE 1 TABLET EVERY MORNING AND 1 AND 1/2 TABLET EVERY EVENING 225 tablet 1  . Metoprolol-Hydrochlorothiazide 100-12.5 MG TB24 metoprolol suc 100 mg-hydrochlorothiazide 12.5 mg tablet,ext.rel 24 hr  Take 1 tablet every day by oral route.    . Misc Natural Products (GLUCOSAMINE CHOND COMPLEX/MSM PO) Take by mouth. Occasionally    . montelukast (SINGULAIR) 10 MG tablet TAKE 1 TABLET BY MOUTH EVERYDAY AT BEDTIME 90 tablet 1  . Multiple Vitamin (MULTIVITAMIN) tablet Take 1 tablet by mouth. occasionally    . nitroGLYCERIN (NITROSTAT) 0.4 MG SL tablet PLACE 1 TABLET UNDER THE TONGUE EVERY 5 MINUTES AS NEEDED FOR CHEST PAIN. 75 tablet 1  . pantoprazole (PROTONIX) 40 MG tablet TAKE 1 TABLET BY MOUTH EVERY DAY 90 tablet 1  . triamterene-hydrochlorothiazide (MAXZIDE-25) 37.5-25 MG tablet TAKE 1 TABLET BY MOUTH EVERY DAY 90 tablet 2  . Wheat Dextrin (BENEFIBER PO) Take by mouth. Daily    . zolpidem (AMBIEN) 10 MG tablet TAKE 1 TABLET BY MOUTH AT BEDTIME AS NEEDED FOR SLEEP. 30 tablet 0  . [DISCONTINUED] omeprazole (PRILOSEC OTC) 20 MG tablet Take 1 tablet (20 mg total) by mouth daily. 30 tablet 1   No current facility-administered medications on file prior to visit.    Allergies  Allergen Reactions  . Ciprofloxacin   . Citalopram Hydrobromide   . Escitalopram Oxalate   . Paroxetine   . Polysporin [Bacitracin-Polymyxin B]     Objective: Physical Exam  General: Well developed, nourished, no acute distress, awake, alert and oriented x 3  Vascular: Dorsalis pedis artery 1/4 bilateral, Posterior tibial artery 1/4 bilateral, skin temperature warm to warm proximal to distal bilateral lower extremities, no varicosities, pedal hair present bilateral.  Neurological: Gross sensation present via light  touch bilateral.   Dermatological: Skin is warm, dry, and supple bilateral, Nails 1-10 are tender, long, thick, and discolored with mild subungal debris, no acute ingrowing noted, no webspace macerations present bilateral, no open lesions present bilateral, no callus/corns/hyperkeratotic tissue present bilateral. No signs of infection bilateral.  Musculoskeletal: Asymptomatic pes planus and bunion boney deformities noted bilateral. Muscular strength within normal limits without painon range of motion. No pain with calf compression bilateral.  Assessment and Plan:  Problem List Items Addressed This Visit      Endocrine   Diabetes mellitus without complication (HCC)   Relevant Medications   losartan (COZAAR) 25 MG tablet   Metoprolol-Hydrochlorothiazide 100-12.5 MG TB24    Other Visit Diagnoses    Pain due to onychomycosis of toenails of both feet    -  Primary   PVD (peripheral vascular disease) (HCC)       Relevant Medications   losartan (COZAAR) 25 MG tablet   Metoprolol-Hydrochlorothiazide 100-12.5 MG TB24   HX: long term anticoagulant use         -Examined patient.  -Re-Discussed treatment options for painful mycotic nails like previous -Mechanically debrided and reduced mycotic nails with sterile nail nipper and dremel nail file without incident.  -Patient to be measured for diabetic shoes as scheduled -Patient to return in 2.5 to 3 months for follow up evaluation or sooner if symptoms worsen.  [name], [name]

## 2020-11-11 ENCOUNTER — Ambulatory Visit (INDEPENDENT_AMBULATORY_CARE_PROVIDER_SITE_OTHER): Payer: Medicare Other | Admitting: *Deleted

## 2020-11-11 DIAGNOSIS — J309 Allergic rhinitis, unspecified: Secondary | ICD-10-CM | POA: Diagnosis not present

## 2020-11-12 ENCOUNTER — Encounter: Payer: Self-pay | Admitting: Allergy

## 2020-11-12 ENCOUNTER — Other Ambulatory Visit: Payer: Self-pay

## 2020-11-12 ENCOUNTER — Ambulatory Visit (INDEPENDENT_AMBULATORY_CARE_PROVIDER_SITE_OTHER): Payer: Medicare Other | Admitting: Allergy

## 2020-11-12 VITALS — BP 138/70 | HR 72 | Resp 16 | Wt 196.6 lb

## 2020-11-12 DIAGNOSIS — J302 Other seasonal allergic rhinitis: Secondary | ICD-10-CM | POA: Diagnosis not present

## 2020-11-12 DIAGNOSIS — H1013 Acute atopic conjunctivitis, bilateral: Secondary | ICD-10-CM

## 2020-11-12 DIAGNOSIS — T7840XD Allergy, unspecified, subsequent encounter: Secondary | ICD-10-CM

## 2020-11-12 DIAGNOSIS — K219 Gastro-esophageal reflux disease without esophagitis: Secondary | ICD-10-CM | POA: Diagnosis not present

## 2020-11-12 DIAGNOSIS — H1010 Acute atopic conjunctivitis, unspecified eye: Secondary | ICD-10-CM

## 2020-11-12 DIAGNOSIS — J3089 Other allergic rhinitis: Secondary | ICD-10-CM

## 2020-11-12 MED ORDER — EPINEPHRINE 0.3 MG/0.3ML IJ SOAJ
INTRAMUSCULAR | 3 refills | Status: DC
Start: 2020-11-12 — End: 2021-12-23

## 2020-11-12 NOTE — Assessment & Plan Note (Signed)
Stable.  Continue pantoprazole 40 mg daily as prescribed. 

## 2020-11-12 NOTE — Assessment & Plan Note (Signed)
No reactions since last visit.   Continue avoidance of products containing octinoxate and oxybenzone.  For mild symptoms you can take over the counter antihistamines such as Benadryl and monitor symptoms closely. If symptoms worsen or if you have severe symptoms including breathing issues, throat closure, significant swelling, whole body hives, severe diarrhea and vomiting, lightheadedness then inject epinephrine and seek immediate medical care afterwards.

## 2020-11-12 NOTE — Patient Instructions (Addendum)
Allergic rhino conjunctivitis  Continue allergy injections.  Continue environmental control measures.  ONLY use Allegra (fexofenadine) once a day if NEEDED.  May use Flonase (fluticasone) nasal spray 1 spray per nostril twice a day as needed for nasal congestion.   Nasal saline spray (i.e., Simply Saline) or nasal saline lavage (i.e., NeilMed) is recommended as needed and prior to medicated nasal sprays.  GERD  Continue pantoprazole 40mg  daily as prescribed.  Follow up in 1 year or sooner if needed. We will re-test at next visit if needed.  Reducing Pollen Exposure . Pollen seasons: trees (spring), grass (summer) and ragweed/weeds (fall). [name] Keep windows closed in your home and car to lower pollen exposure.  Madeline Wood air conditioning in the bedroom and throughout the house if possible.  . Avoid going out in dry windy days - especially early morning. . Pollen counts are highest between 5 - 10 AM and on dry, hot and windy days.  . Save outside activities for late afternoon or after a heavy rain, when pollen levels are lower.  . Avoid mowing of grass if you have grass pollen allergy. [name] Be aware that pollen can also be transported indoors on people and pets.  . Dry your clothes in an automatic dryer rather than hanging them outside where they might collect pollen.  . Rinse hair and eyes before bedtime. Control of House Dust Mite Allergen . Dust mite allergens are a common trigger of allergy and asthma symptoms. While they can be found throughout the house, these microscopic creatures thrive in warm, humid environments such as bedding, upholstered furniture and carpeting. . Because so much time is spent in the bedroom, it is essential to reduce mite levels there.  . Encase pillows, mattresses, and box springs in special allergen-proof fabric covers or airtight, zippered plastic covers.  . Bedding should be washed weekly in hot water (130 F) and dried in a hot dryer. Allergen-proof  covers are available for comforters and pillows that can't be regularly washed.  [name] the allergy-proof covers every few months. Minimize clutter in the bedroom. Keep pets out of the bedroom.  [name] Keep humidity less than 50% by using a dehumidifier or air conditioning. You can buy a humidity measuring device called a hygrometer to monitor this.  . If possible, replace carpets with hardwood, linoleum, or washable area rugs. If that's not possible, vacuum frequently with a vacuum that has a HEPA filter. . Remove all upholstered furniture and non-washable window drapes from the bedroom. . Remove all non-washable stuffed toys from the bedroom.  Wash stuffed toys weekly. Pet Allergen Avoidance: . Contrary to popular opinion, there are no "hypoallergenic" breeds of dogs or cats. That is because people are not allergic to an animal's hair, but to an allergen found in the animal's saliva, dander (dead skin flakes) or urine. Pet allergy symptoms typically occur within minutes. For some people, symptoms can build up and become most severe 8 to 12 hours after contact with the animal. People with severe allergies can experience reactions in public places if dander has been transported on the pet owners' clothing. [name] Keeping an animal outdoors is only a partial solution, since homes with pets in the yard still have higher concentrations of animal allergens. . Before getting a pet, ask your allergist to determine if you are allergic to animals. If your pet is already considered part of your family, try to minimize contact and keep the pet out of the bedroom and other rooms where  you spend a great deal of time. . As with dust mites, vacuum carpets often or replace carpet with a hardwood floor, tile or linoleum. . High-efficiency particulate air (HEPA) cleaners can reduce allergen levels over time. . While dander and saliva are the source of cat and dog allergens, urine is the source of allergens from rabbits, hamsters,  mice and [place] pigs; so ask a non-allergic family member to clean the animal's cage. . If you have a pet allergy, talk to your allergist about the potential for allergy immunotherapy (allergy shots). This strategy can often provide long-term relief. Mold Control . Mold and fungi can grow on a variety of surfaces provided certain temperature and moisture conditions exist.  . Outdoor molds grow on plants, decaying vegetation and soil. The major outdoor mold, Alternaria and Cladosporium, are found in very high numbers during hot and dry conditions. Generally, a late summer - fall peak is seen for common outdoor fungal spores. Rain will temporarily lower outdoor mold spore count, but counts rise rapidly when the rainy period ends. . The most important indoor molds are Aspergillus and Penicillium. Dark, humid and poorly ventilated basements are ideal sites for mold growth. The next most common sites of mold growth are the bathroom and the kitchen. Outdoor (Seasonal) Mold Control . Use air conditioning and keep windows closed. . Avoid exposure to decaying vegetation. [name] Avoid leaf raking. . Avoid grain handling. . Consider wearing a face mask if working in moldy areas.  Indoor (Perennial) Mold Control  . Maintain humidity below 50%. . Get rid of mold growth on hard surfaces with water, detergent and, if necessary, 5% bleach (do not mix with other cleaners). Then dry the area completely. If mold covers an area more than 10 square feet, consider hiring an indoor environmental professional. . For clothing, washing with soap and water is best. If moldy items cannot be cleaned and dried, throw them away. . Remove sources e.g. contaminated carpets. . Repair and seal leaking roofs or pipes. Using dehumidifiers in damp basements may be helpful, but empty the water and clean units regularly to prevent mildew from forming. All rooms, especially basements, bathrooms and kitchens, require ventilation and cleaning to  deter mold and mildew growth. Avoid carpeting on concrete or damp floors, and storing items in damp areas. Cockroach Allergen Avoidance Cockroaches are often found in the homes of densely populated urban areas, schools or commercial buildings, but these creatures can lurk almost anywhere. This does not mean that you have a dirty house or living area. . Block all areas where roaches can enter the home. This includes crevices, wall cracks and windows.  . Cockroaches need water to survive, so fix and seal all leaky faucets and pipes. Have an exterminator go through the house when your family and pets are gone to eliminate any remaining roaches. [name] Keep food in lidded containers and put pet food dishes away after your pets are done eating. Vacuum and sweep the floor after meals, and take out garbage and recyclables. Use lidded garbage containers in the kitchen. Wash dishes immediately after use and clean under stoves, refrigerators or toasters where crumbs can accumulate. Wipe off the stove and other kitchen surfaces and cupboards regularly.

## 2020-11-12 NOTE — Progress Notes (Signed)
Follow Up Note  RE: Madeline Wood MRN: [id number] DOB: 1946-08-05 Date of Office Visit: 11/12/2020  Referring provider: [name], MD Primary care provider: [name], MD  Chief Complaint: Allergic Rhinitis  and Gastroesophageal Reflux  History of Present Illness: I had the pleasure of seeing Madeline Wood for a follow up visit at the [facility] on 11/12/2020. She is a 75 y.o. female, who is being followed for allergic rhinoconjunctivitis on AIT, allergic reaction and GERD. Madeline Wood previous allergy office visit was on 09/29/2019 with Dr. [name]. Today is a regular follow up visit.  Allergic rhinoconjunctivitis Patient has been on allergy injections even before 2016.  Currently on allergy injections every 2 weeks with good benefit. Mild localized itching. No prior Epipen use. Taking allegra 180mg  daily but having some dryness.  Only using eye drops and Flonase only if needed.   Allergic reaction No reactions since the last visit. Avoiding products with octinoxate and oxybenzone.  GERD (gastroesophageal reflux disease) Stable.   Assessment and Plan: Sharnell is a 75 y.o. female with: Seasonal and perennial allergic rhinoconjunctivitis Past history - no recent testing. On AIT for more than 6 years now (GRASS-WEED-TREE-MITE-DOG & MOLD-CAT-CR) Interim history - tolerating AIT every 2 weeks with good benefit. Allegra seems to dry out oral mucosa.  Continue allergy injections.  Continue environmental control measures.  ONLY use Allegra (fexofenadine) once a day if NEEDED.  May use Flonase (fluticasone) nasal spray 1 spray per nostril twice a day as needed for nasal congestion.   Nasal saline spray (i.e., Simply Saline) or nasal saline lavage (i.e., NeilMed) is recommended as needed and prior to medicated nasal sprays.  Will re-test before stopping AIT.   Allergic reaction No reactions since last visit.   Continue avoidance of products  containing octinoxate and oxybenzone.  For mild symptoms you can take over the counter antihistamines such as Benadryl and monitor symptoms closely. If symptoms worsen or if you have severe symptoms including breathing issues, throat closure, significant swelling, whole body hives, severe diarrhea and vomiting, lightheadedness then inject epinephrine and seek immediate medical care afterwards.  GERD (gastroesophageal reflux disease) Stable.  Continue pantoprazole 40mg  daily as prescribed.  Return in about 1 year (around 11/12/2021).  Meds ordered this encounter  Medications  . EPINEPHrine (EPIPEN 2-PAK) 0.3 mg/0.3 mL IJ SOAJ injection    Sig: Use as directed for life threatening allergic reactions    Dispense:  1 each    Refill:  3    Please dispense MYLAN or TEVA devices only, order if necessary. Thanks.   Lab Orders  No laboratory test(s) ordered today    Diagnostics: None.   Medication List:  Current Outpatient Medications  Medication Sig Dispense Refill  . ALPRAZolam (XANAX) 1 MG tablet TAKE 1 TABLET BY MOUTH TWICE A DAY 60 tablet 2  . aspirin 81 MG tablet Take 81 mg by mouth daily.    [name] atorvastatin (LIPITOR) 80 MG tablet TAKE 1 TABLET BY MOUTH EVERY DAY 90 tablet 2  . cyclobenzaprine (FLEXERIL) 5 MG tablet Take 1 tablet (5 mg total) by mouth 3 (three) times daily as needed for muscle spasms. 20 tablet 0  . fexofenadine (ALLEGRA) 180 MG tablet Take 1 tablet (180 mg total) by mouth daily. 30 tablet 5  . fish oil-omega-3 fatty acids 1000 MG capsule Take 1 g by mouth. Takes occasionally    . fluticasone (FLONASE) 50 MCG/ACT nasal spray Place 1-2 sprays into both nostrils  daily as needed for allergies or rhinitis. 48 g 3  . losartan (COZAAR) 25 MG tablet Take by mouth.    . metoprolol tartrate (LOPRESSOR) 50 MG tablet TAKE 1 TABLET EVERY MORNING AND 1 AND 1/2 TABLET EVERY EVENING 225 tablet 1  . Multiple Vitamin (MULTIVITAMIN) tablet Take 1 tablet by mouth. occasionally    .  nitroGLYCERIN (NITROSTAT) 0.4 MG SL tablet PLACE 1 TABLET UNDER THE TONGUE EVERY 5 MINUTES AS NEEDED FOR CHEST PAIN. 75 tablet 1  . pantoprazole (PROTONIX) 40 MG tablet TAKE 1 TABLET BY MOUTH EVERY DAY 90 tablet 1  . triamterene-hydrochlorothiazide (MAXZIDE-25) 37.5-25 MG tablet TAKE 1 TABLET BY MOUTH EVERY DAY 90 tablet 2  . Wheat Dextrin (BENEFIBER PO) Take by mouth. Daily    . zolpidem (AMBIEN) 10 MG tablet TAKE 1 TABLET BY MOUTH AT BEDTIME AS NEEDED FOR SLEEP. 30 tablet 0  . EPINEPHrine (EPIPEN 2-PAK) 0.3 mg/0.3 mL IJ SOAJ injection Use as directed for life threatening allergic reactions 1 each 3   No current facility-administered medications for this visit.   Allergies: Allergies  Allergen Reactions  . Ciprofloxacin   . Citalopram Hydrobromide   . Escitalopram Oxalate   . Paroxetine   . Polysporin [Bacitracin-Polymyxin B]    I reviewed Madeline Wood past medical history, social history, family history, and environmental history and no significant changes have been reported from Madeline Wood previous visit.  Review of Systems  Constitutional: Negative for appetite change, chills, fever and unexpected weight change.  HENT: Negative for congestion and rhinorrhea.   Eyes: Negative for itching.  Respiratory: Negative for cough, chest tightness, shortness of breath and wheezing.   Gastrointestinal: Negative for abdominal pain.  Skin: Negative for rash.  Allergic/Immunologic: Positive for environmental allergies.  Neurological: Negative for headaches.   Objective: BP 138/70   Pulse 72   Resp 16   Wt 196 lb 9.6 oz (89.2 kg)   SpO2 99%   BMI 31.73 kg/m  Body mass index is 31.73 kg/m. Physical Exam Vitals and nursing note reviewed.  Constitutional:      Appearance: Normal appearance. She is well-developed.  HENT:     Head: Normocephalic and atraumatic.     Right Ear: External ear normal.     Left Ear: External ear normal.     Nose: Nose normal.     Mouth/Throat:     Mouth: Mucous membranes  are moist.     Pharynx: Oropharynx is clear.  Eyes:     Conjunctiva/sclera: Conjunctivae normal.  Cardiovascular:     Rate and Rhythm: Normal rate and regular rhythm.     Heart sounds: Normal heart sounds. No murmur heard.   Pulmonary:     Effort: Pulmonary effort is normal.     Breath sounds: Normal breath sounds. No wheezing, rhonchi or rales.  Musculoskeletal:     Cervical back: Neck supple.  Skin:    General: Skin is warm.     Findings: No rash.  Neurological:     Mental Status: She is alert and oriented to person, place, and time.  Psychiatric:        Behavior: Behavior normal.    Previous notes and tests were reviewed. The plan was reviewed with the patient/family, and all questions/concerned were addressed.  It was my pleasure to see Vivian today and participate in Madeline Wood care. Please feel free to contact me with any questions or concerns.  Sincerely,  Rexene Alberts, DO Allergy & Immunology  [facility]  [facility] office: [place] office: [phone]

## 2020-11-12 NOTE — Assessment & Plan Note (Signed)
Past history - no recent testing. On AIT for more than 6 years now (GRASS-WEED-TREE-MITE-DOG & MOLD-CAT-CR) Interim history - tolerating AIT every 2 weeks with good benefit. Allegra seems to dry out oral mucosa.  Continue allergy injections.  Continue environmental control measures.  ONLY use Allegra (fexofenadine) once a day if NEEDED.  May use Flonase (fluticasone) nasal spray 1 spray per nostril twice a day as needed for nasal congestion.   Nasal saline spray (i.e., Simply Saline) or nasal saline lavage (i.e., NeilMed) is recommended as needed and prior to medicated nasal sprays.  Will re-test before stopping AIT.

## 2020-11-17 ENCOUNTER — Ambulatory Visit: Payer: Medicare Other | Admitting: Family Medicine

## 2020-11-29 DIAGNOSIS — J3089 Other allergic rhinitis: Secondary | ICD-10-CM | POA: Diagnosis not present

## 2020-11-29 NOTE — Progress Notes (Signed)
VIALS EXP 11-29-21

## 2020-11-30 ENCOUNTER — Ambulatory Visit (INDEPENDENT_AMBULATORY_CARE_PROVIDER_SITE_OTHER): Payer: Medicare Other | Admitting: *Deleted

## 2020-11-30 DIAGNOSIS — J309 Allergic rhinitis, unspecified: Secondary | ICD-10-CM

## 2020-12-01 DIAGNOSIS — J3089 Other allergic rhinitis: Secondary | ICD-10-CM

## 2020-12-09 ENCOUNTER — Other Ambulatory Visit: Payer: Self-pay | Admitting: Family Medicine

## 2020-12-09 MED ORDER — ALPRAZOLAM 1 MG PO TABS
1.0000 mg | ORAL_TABLET | Freq: Two times a day (BID) | ORAL | 2 refills | Status: DC
Start: 2020-12-09 — End: 2021-05-24

## 2020-12-09 NOTE — Telephone Encounter (Signed)
Patient needs a refill of  ALPRAZolam [name] tablet 0.25 mg  [[id number]]  Pharmacy:   [id number] - [place], [place] - [address]  [address] [place] [address]  Phone:  [phone] Fax:  [phone]  DEA #:  [id number]  Has appt with provider on 4/29.   Please advise patient when Rx called in at [phone]

## 2020-12-15 ENCOUNTER — Other Ambulatory Visit: Payer: Self-pay

## 2020-12-15 ENCOUNTER — Ambulatory Visit (INDEPENDENT_AMBULATORY_CARE_PROVIDER_SITE_OTHER): Payer: Medicare Other | Admitting: Sports Medicine

## 2020-12-15 ENCOUNTER — Ambulatory Visit (INDEPENDENT_AMBULATORY_CARE_PROVIDER_SITE_OTHER): Payer: Medicare Other

## 2020-12-15 DIAGNOSIS — J309 Allergic rhinitis, unspecified: Secondary | ICD-10-CM | POA: Diagnosis not present

## 2020-12-15 DIAGNOSIS — E119 Type 2 diabetes mellitus without complications: Secondary | ICD-10-CM

## 2020-12-15 DIAGNOSIS — I739 Peripheral vascular disease, unspecified: Secondary | ICD-10-CM

## 2020-12-15 NOTE — Progress Notes (Signed)
Patient presented for foam casting for 3 pair custom diabetic shoe inserts. Patient is measured with a Brannock Device to be a size 11 x-wide.  Diabetic shoes are chosen from the [employer] step catalog.  The shoes chosen are [id number]  Patient will be contacted when the shoes and inserts are ready for pick up.

## 2020-12-16 ENCOUNTER — Ambulatory Visit: Payer: Self-pay | Admitting: *Deleted

## 2020-12-17 ENCOUNTER — Other Ambulatory Visit: Payer: Self-pay

## 2020-12-17 ENCOUNTER — Other Ambulatory Visit: Payer: Medicare Other

## 2020-12-17 DIAGNOSIS — I10 Essential (primary) hypertension: Secondary | ICD-10-CM

## 2020-12-17 DIAGNOSIS — E119 Type 2 diabetes mellitus without complications: Secondary | ICD-10-CM

## 2020-12-17 DIAGNOSIS — Z13220 Encounter for screening for lipoid disorders: Secondary | ICD-10-CM

## 2020-12-17 DIAGNOSIS — E782 Mixed hyperlipidemia: Secondary | ICD-10-CM

## 2020-12-17 DIAGNOSIS — I2510 Atherosclerotic heart disease of native coronary artery without angina pectoris: Secondary | ICD-10-CM

## 2020-12-20 ENCOUNTER — Other Ambulatory Visit: Payer: Medicare Other

## 2020-12-20 ENCOUNTER — Other Ambulatory Visit: Payer: Self-pay

## 2020-12-20 DIAGNOSIS — E782 Mixed hyperlipidemia: Secondary | ICD-10-CM | POA: Diagnosis not present

## 2020-12-20 DIAGNOSIS — E119 Type 2 diabetes mellitus without complications: Secondary | ICD-10-CM | POA: Diagnosis not present

## 2020-12-20 DIAGNOSIS — Z136 Encounter for screening for cardiovascular disorders: Secondary | ICD-10-CM | POA: Diagnosis not present

## 2020-12-20 DIAGNOSIS — I2510 Atherosclerotic heart disease of native coronary artery without angina pectoris: Secondary | ICD-10-CM | POA: Diagnosis not present

## 2020-12-20 DIAGNOSIS — I10 Essential (primary) hypertension: Secondary | ICD-10-CM | POA: Diagnosis not present

## 2020-12-21 LAB — LIPID PANEL
Cholesterol: 158 mg/dL (ref <200)
HDL: 68 mg/dL (ref >=50)
LDL Cholesterol (Calc): 76 mg/dL (calc)
Non-HDL Cholesterol (Calc): 90 mg/dL (calc) (ref <130)
Total CHOL/HDL Ratio: 2.3 (calc) (ref <5.0)
Triglycerides: 62 mg/dL (ref <150)

## 2020-12-21 LAB — CBC WITH DIFFERENTIAL/PLATELET
Absolute Monocytes: 577 cells/uL (ref 200–950)
Basophils Absolute: 31 cells/uL (ref 0–200)
Basophils Relative: 0.8 %
Eosinophils Absolute: 70 cells/uL (ref 15–500)
Eosinophils Relative: 1.8 %
HCT: 37.3 % (ref 35.0–45.0)
Hemoglobin: 12.3 g/dL (ref 11.7–15.5)
Lymphs Abs: 1736 cells/uL (ref 850–3900)
MCH: 29.9 pg (ref 27.0–33.0)
MCHC: 33 g/dL (ref 32.0–36.0)
MCV: 90.8 fL (ref 80.0–100.0)
MPV: 8.9 fL (ref 7.5–12.5)
Monocytes Relative: 14.8 %
Neutro Abs: 1486 cells/uL — ABNORMAL LOW (ref 1500–7800)
Neutrophils Relative %: 38.1 %
Platelets: 221 10*3/uL (ref 140–400)
RBC: 4.11 10*6/uL (ref 3.80–5.10)
RDW: 12.5 % (ref 11.0–15.0)
Total Lymphocyte: 44.5 %
WBC: 3.9 10*3/uL (ref 3.8–10.8)

## 2020-12-21 LAB — COMPLETE METABOLIC PANEL WITH GFR
AG Ratio: 1.4 (calc) (ref 1.0–2.5)
ALT: 11 U/L (ref 6–29)
AST: 28 U/L (ref 10–35)
Albumin: 4.2 g/dL (ref 3.6–5.1)
Alkaline phosphatase (APISO): 35 U/L — ABNORMAL LOW (ref 37–153)
BUN: 10 mg/dL (ref 7–25)
CO2: 30 mmol/L (ref 20–32)
Calcium: 10.1 mg/dL (ref 8.6–10.4)
Chloride: 92 mmol/L — ABNORMAL LOW (ref 98–110)
Creat: 0.73 mg/dL (ref 0.60–0.93)
GFR, Est African American: 93 mL/min/{1.73_m2} (ref >=60)
GFR, Est Non African American: 81 mL/min/{1.73_m2} (ref >=60)
Globulin: 2.9 g/dL (calc) (ref 1.9–3.7)
Glucose, Bld: 88 mg/dL (ref 65–99)
Potassium: 4.2 mmol/L (ref 3.5–5.3)
Sodium: 130 mmol/L — ABNORMAL LOW (ref 135–146)
Total Bilirubin: 1.1 mg/dL (ref 0.2–1.2)
Total Protein: 7.1 g/dL (ref 6.1–8.1)

## 2020-12-21 LAB — HEMOGLOBIN A1C
Hgb A1c MFr Bld: 5.9 % of total Hgb — ABNORMAL HIGH (ref <5.7)
Mean Plasma Glucose: 123 mg/dL
eAG (mmol/L): 6.8 mmol/L

## 2020-12-21 LAB — MICROALBUMIN / CREATININE URINE RATIO
Creatinine, Urine: 109 mg/dL (ref 20–275)
Microalb Creat Ratio: 17 mcg/mg creat (ref <30)
Microalb, Ur: 1.8 mg/dL

## 2020-12-24 ENCOUNTER — Encounter: Payer: Self-pay | Admitting: Family Medicine

## 2020-12-24 ENCOUNTER — Encounter: Payer: Medicare Other | Admitting: Family Medicine

## 2020-12-24 ENCOUNTER — Ambulatory Visit (INDEPENDENT_AMBULATORY_CARE_PROVIDER_SITE_OTHER): Payer: Medicare Other | Admitting: Family Medicine

## 2020-12-24 ENCOUNTER — Other Ambulatory Visit: Payer: Self-pay

## 2020-12-24 VITALS — BP 124/68 | HR 76 | Temp 98.2°F | Resp 12 | Ht 66.0 in | Wt 191.0 lb

## 2020-12-24 DIAGNOSIS — Z0001 Encounter for general adult medical examination with abnormal findings: Secondary | ICD-10-CM | POA: Diagnosis not present

## 2020-12-24 DIAGNOSIS — E119 Type 2 diabetes mellitus without complications: Secondary | ICD-10-CM

## 2020-12-24 DIAGNOSIS — I2510 Atherosclerotic heart disease of native coronary artery without angina pectoris: Secondary | ICD-10-CM | POA: Diagnosis not present

## 2020-12-24 DIAGNOSIS — Z0000 Encounter for general adult medical examination without abnormal findings: Secondary | ICD-10-CM

## 2020-12-24 DIAGNOSIS — E782 Mixed hyperlipidemia: Secondary | ICD-10-CM

## 2020-12-24 DIAGNOSIS — Z1231 Encounter for screening mammogram for malignant neoplasm of breast: Secondary | ICD-10-CM

## 2020-12-24 DIAGNOSIS — I10 Essential (primary) hypertension: Secondary | ICD-10-CM

## 2020-12-24 NOTE — Progress Notes (Signed)
Subjective:    Patient ID: Madeline Wood, female    DOB: 11-24-1945, 75 y.o.   MRN: [id number]     Patient is a very pleasant 75 year old African-American female who is here today for a checkup.  Last colonoscopy was in October 2021.  This revealed 1 polyp and a small angiodysplastic lesion.  She also had a mammogram in May 2021 that was normal.  She is due again next month.  Due to her age she does not require Pap smear.  Immunization records are listed below: Immunization History  Administered Date(s) Administered  . Fluad Quad(high Dose 65+) 05/20/2019  . H1N1 08/03/2008  . Influenza Whole 05/28/2008, 07/07/2011  . Influenza, High Dose Seasonal PF 06/04/2017, 05/29/2018, 07/14/2020  . Influenza,inj,Quad PF,6+ Mos 05/28/2013, 07/02/2014, 06/01/2015, 05/24/2016  . Influenza-Unspecified 06/09/2008, 06/03/2009, 05/19/2010  . Pneumococcal Conjugate-13 08/18/2013  . Pneumococcal Polysaccharide-23 02/25/2014  . Td 05/13/2007, 01/25/2012  . Tdap 01/25/2012  . Unspecified SARS-COV-2 Vaccination 10/20/2019, 10/31/2019  . Zoster 07/02/2014     Overall, she is doing well.  She denies any chest pain shortness of breath or dyspnea on exertion.  Blood pressure today is outstanding at 124/68.  Her recent lab work below is excellent. Lab on 12/17/2020  Component Date Value Ref Range Status  . WBC 12/20/2020 3.9  3.8 - 10.8 Thousand/uL Final  . RBC 12/20/2020 4.11  3.80 - 5.10 Million/uL Final  . Hemoglobin 12/20/2020 12.3  11.7 - 15.5 g/dL Final  . HCT 12/20/2020 37.3  35.0 - 45.0 % Final  . MCV 12/20/2020 90.8  80.0 - 100.0 fL Final  . MCH 12/20/2020 29.9  27.0 - 33.0 pg Final  . MCHC 12/20/2020 33.0  32.0 - 36.0 g/dL Final  . RDW 12/20/2020 12.5  11.0 - 15.0 % Final  . Platelets 12/20/2020 221  140 - 400 Thousand/uL Final  . MPV 12/20/2020 8.9  7.5 - 12.5 fL Final  . Neutro Abs 12/20/2020 1,486* 1,500 - 7,800 cells/uL Final  . Lymphs Abs 12/20/2020 1,736  850 - 3,900 cells/uL Final  .  Absolute Monocytes 12/20/2020 577  200 - 950 cells/uL Final  . Eosinophils Absolute 12/20/2020 70  15 - 500 cells/uL Final  . Basophils Absolute 12/20/2020 31  0 - 200 cells/uL Final  . Neutrophils Relative % 12/20/2020 38.1  % Final  . Total Lymphocyte 12/20/2020 44.5  % Final  . Monocytes Relative 12/20/2020 14.8  % Final  . Eosinophils Relative 12/20/2020 1.8  % Final  . Basophils Relative 12/20/2020 0.8  % Final  . Glucose, Bld 12/20/2020 88  65 - 99 mg/dL Final   Comment: .            Fasting reference interval .   . BUN 12/20/2020 10  7 - 25 mg/dL Final  . Creat 12/20/2020 0.73  0.60 - 0.93 mg/dL Final   Comment: For patients >86 years of age, the reference limit for Creatinine is approximately 13% higher for people identified as African-American. .   . GFR, Est Non African American 12/20/2020 81  > OR = 60 mL/min/1.17m2 Final  . GFR, Est African American 12/20/2020 93  > OR = 60 mL/min/1.14m2 Final  . BUN/Creatinine Ratio [id number] NOT APPLICABLE  6 - 22 (calc) Final  . Sodium 12/20/2020 130* 135 - 146 mmol/L Final  . Potassium 12/20/2020 4.2  3.5 - 5.3 mmol/L Final  . Chloride 12/20/2020 92* 98 - 110 mmol/L Final  . CO2 12/20/2020 30  20 - 32 mmol/L  Final  . Calcium 12/20/2020 10.1  8.6 - 10.4 mg/dL Final  . Total Protein 12/20/2020 7.1  6.1 - 8.1 g/dL Final  . Albumin 12/20/2020 4.2  3.6 - 5.1 g/dL Final  . Globulin 12/20/2020 2.9  1.9 - 3.7 g/dL (calc) Final  . AG Ratio 12/20/2020 1.4  1.0 - 2.5 (calc) Final  . Total Bilirubin 12/20/2020 1.1  0.2 - 1.2 mg/dL Final  . Alkaline phosphatase (APISO) 12/20/2020 35* 37 - 153 U/L Final  . AST 12/20/2020 28  10 - 35 U/L Final  . ALT 12/20/2020 11  6 - 29 U/L Final  . Hgb A1c MFr Bld 12/20/2020 5.9* <5.7 % of total Hgb Final   Comment: For someone without known diabetes, a hemoglobin  A1c value between 5.7% and 6.4% is consistent with prediabetes and should be confirmed with a  follow-up test. . For someone with known  diabetes, a value <7% indicates that their diabetes is well controlled. A1c targets should be individualized based on duration of diabetes, age, comorbid conditions, and other considerations. . This assay result is consistent with an increased risk of diabetes. . Currently, no consensus exists regarding use of hemoglobin A1c for diagnosis of diabetes for children. .   . Mean Plasma Glucose 12/20/2020 123  mg/dL Final  . eAG (mmol/L) 12/20/2020 6.8  mmol/L Final  . Cholesterol 12/20/2020 158  <200 mg/dL Final  . HDL 12/20/2020 68  > OR = 50 mg/dL Final  . Triglycerides 12/20/2020 62  <150 mg/dL Final  . LDL Cholesterol (Calc) 12/20/2020 76  mg/dL (calc) Final   Comment: Reference range: <100 . Desirable range <100 mg/dL for primary prevention;   <70 mg/dL for patients with CHD or diabetic patients  with > or = 2 CHD risk factors. [name] LDL-C is now calculated using the Martin-Hopkins  calculation, which is a validated novel method providing  better accuracy than the Friedewald equation in the  estimation of LDL-C.  Cresenciano Genre et al. Annamaria Helling. 2353;614(43): 2061-2068  (http://education.[facility].com/faq/FAQ164)   . Total CHOL/HDL Ratio 12/20/2020 2.3  <5.0 (calc) Final  . Non-HDL Cholesterol (Calc) 12/20/2020 90  <130 mg/dL (calc) Final   Comment: For patients with diabetes plus 1 major ASCVD risk  factor, treating to a non-HDL-C goal of <100 mg/dL  (LDL-C of <70 mg/dL) is considered a therapeutic  option.   . Creatinine, Urine 12/20/2020 109  20 - 275 mg/dL Final  . Microalb, Ur 12/20/2020 1.8  mg/dL Final   Comment: Reference Range Not established   . Microalb Creat Ratio 12/20/2020 17  <30 mcg/mg creat Final   Comment: . The ADA defines abnormalities in albumin excretion as follows: [name] Albuminuria Category        Result (mcg/mg creatinine) . Normal to Mildly increased   <30 Moderately increased         30-299  Severely increased           > OR = 300 . The ADA  recommends that at least two of three specimens collected within a 3-6 month period be abnormal before considering a patient to be within a diagnostic category.      Past Medical History:  Diagnosis Date  . Allergy    seasonal/ environmental/ animals gets allergy injections   . 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 Angio-edema   . Anxiety   . Arthritis   . Blood transfusion without reported diagnosis    1985 with hysterectomy   . CAD (coronary artery disease)  stent of distal RCA in 2006  . Diabetes mellitus without complication ([place])    diet controlled-   . Diverticulosis   . GERD (gastroesophageal reflux disease)   . Gout   . H/O heart artery stent   . Hiatal hernia   . History of nuclear stress test 03/2011   negative lexiscan myoview; normal perfusion  . Hyperlipidemia   . Hypertension   . Knee pain   . Neuromuscular disorder ([place])    Hiatal Hernia   . Obesity   . Positive TB test   . Venous insufficiency    Past Surgical History:  Procedure Laterality Date  . ABDOMINAL HYSTERECTOMY    . BACK SURGERY  2012  . BREAST LUMPECTOMY    . COLONOSCOPY  2011  . CORONARY ANGIOPLASTY WITH STENT PLACEMENT  06/29/2005   Taxus 2.5x16mm DES to distal RCA (Dr. Tami Ribas)  . TRANSTHORACIC ECHOCARDIOGRAM  12/20/2012   EF 16-10%, normal systolic function, mild hypokinesis of inf myocardium; calcified MV annulua, LA & RA mildly dilated   Current Outpatient Medications on File Prior to Visit  Medication Sig Dispense Refill  . ALPRAZolam (XANAX) 1 MG tablet Take 1 tablet (1 mg total) by mouth 2 (two) times daily. 60 tablet 2  . aspirin 81 MG tablet Take 81 mg by mouth daily.    [name] atorvastatin (LIPITOR) 80 MG tablet TAKE 1 TABLET BY MOUTH EVERY DAY 90 tablet 2  . cyclobenzaprine (FLEXERIL) 5 MG tablet Take 1 tablet (5 mg total) by mouth 3 (three) times daily as needed for muscle spasms. 20 tablet 0  . EPINEPHrine (EPIPEN 2-PAK) 0.3 mg/0.3 mL IJ SOAJ injection Use as directed for life threatening  allergic reactions 1 each 3  . fexofenadine (ALLEGRA) 180 MG tablet Take 1 tablet (180 mg total) by mouth daily. 30 tablet 5  . fish oil-omega-3 fatty acids 1000 MG capsule Take 1 g by mouth. Takes occasionally    . fluticasone (FLONASE) 50 MCG/ACT nasal spray Place 1-2 sprays into both nostrils daily as needed for allergies or rhinitis. 48 g 3  . losartan (COZAAR) 25 MG tablet Take by mouth.    . metoprolol tartrate (LOPRESSOR) 50 MG tablet TAKE 1 TABLET EVERY MORNING AND 1 AND 1/2 TABLET EVERY EVENING 225 tablet 1  . Multiple Vitamin (MULTIVITAMIN) tablet Take 1 tablet by mouth. occasionally    . nitroGLYCERIN (NITROSTAT) 0.4 MG SL tablet PLACE 1 TABLET UNDER THE TONGUE EVERY 5 MINUTES AS NEEDED FOR CHEST PAIN. 75 tablet 1  . pantoprazole (PROTONIX) 40 MG tablet TAKE 1 TABLET BY MOUTH EVERY DAY 90 tablet 1  . triamterene-hydrochlorothiazide (MAXZIDE-25) 37.5-25 MG tablet TAKE 1 TABLET BY MOUTH EVERY DAY 90 tablet 2  . Wheat Dextrin (BENEFIBER PO) Take by mouth. Daily    . zolpidem (AMBIEN) 10 MG tablet TAKE 1 TABLET BY MOUTH AT BEDTIME AS NEEDED FOR SLEEP. 30 tablet 0  . [DISCONTINUED] omeprazole (PRILOSEC OTC) 20 MG tablet Take 1 tablet (20 mg total) by mouth daily. 30 tablet 1   No current facility-administered medications on file prior to visit.   Allergies  Allergen Reactions  . Ciprofloxacin   . Citalopram Hydrobromide   . Escitalopram Oxalate   . Paroxetine   . Polysporin [Bacitracin-Polymyxin B]    Social History   Socioeconomic History  . Marital status: Single    Spouse name: Not on file  . Number of children: 1  . Years of education: Not on file  . Highest education level: Not  on file  Occupational History  . Occupation: Biomedical engineer  Tobacco Use  . Smoking status: Former Smoker    Quit date: 12/20/2001    Years since quitting: 19.0  . Smokeless tobacco: Never Used  Vaping Use  . Vaping Use: Never used  Substance and Sexual Activity  . Alcohol use: No     Alcohol/week: 0.0 standard drinks  . Drug use: No  . Sexual activity: Not on file  Other Topics Concern  . Not on file  Social History Narrative  . Not on file   Social Determinants of Health   Financial Resource Strain: Low Risk   . Difficulty of Paying Living Expenses: Not very hard  Food Insecurity: Not on file  Transportation Needs: Not on file  Physical Activity: Not on file  Stress: Not on file  Social Connections: Not on file  Intimate Partner Violence: Not on file     Review of Systems  All other systems reviewed and are negative.      Objective:   Physical Exam Constitutional:      General: She is not in acute distress.    Appearance: Normal appearance. She is not ill-appearing, toxic-appearing or diaphoretic.  Cardiovascular:     Rate and Rhythm: Normal rate and regular rhythm.     Pulses: Normal pulses.     Heart sounds: Normal heart sounds. No murmur heard. No friction rub. No gallop.   Pulmonary:     Effort: Pulmonary effort is normal. No respiratory distress.     Breath sounds: Normal breath sounds. No stridor. No wheezing, rhonchi or rales.  Abdominal:     General: Bowel sounds are normal. There is no distension.     Palpations: Abdomen is soft.     Tenderness: There is no abdominal tenderness. There is no rebound.  Musculoskeletal:     Right lower leg: No edema.     Left lower leg: No edema.  Neurological:     Mental Status: She is alert.           Assessment & Plan:  Essential hypertension  Diabetes mellitus without complication ([place])  Atherosclerosis of native coronary artery of native heart without angina pectoris  Mixed hyperlipidemia  General medical exam  Physical exam today is outstanding.  Blood pressure is excellent.  Lab work is well controlled.  I will schedule the patient for mammogram.  Colonoscopy is up-to-date.  Does not require Pap smear.  Immunizations are up-to-date except for the fourth COVID-vaccine which I  recommended she get.  She denies any falls, depression, or memory loss.

## 2020-12-27 NOTE — Progress Notes (Signed)
Chronic Care Management Pharmacy Note  12/30/2020 Name:  Madeline Wood MRN:  [id number] DOB:  1945-10-11  Subjective: Madeline Wood is an 75 y.o. year old female who is a primary patient of Pickard, Cammie Mcgee, MD.  The CCM team was consulted for assistance with disease management and care coordination needs.    Engaged with patient by telephone for follow up visit in response to provider referral for pharmacy case management and/or care coordination services.   Consent to Services:  The patient was given the following information about Chronic Care Management services today, agreed to services, and gave verbal consent: 1. CCM service includes personalized support from designated clinical staff supervised by the primary care provider, including individualized plan of care and coordination with other care providers 2. 24/7 contact phone numbers for assistance for urgent and routine care needs. 3. Service will only be billed when office clinical staff spend 20 minutes or more in a month to coordinate care. 4. Only one practitioner may furnish and bill the service in a calendar month. 5.The patient may stop CCM services at any time (effective at the end of the month) by phone call to the office staff. 6. The patient will be responsible for cost sharing (co-pay) of up to 20% of the service fee (after annual deductible is met). Patient agreed to services and consent obtained.  Patient Care Team: [name], MD as PCP - General (Family Medicine) Dennard Schaumann Cammie Mcgee, MD (Family Medicine) Edythe Clarity, [facility] as Pharmacist (Pharmacist)  Recent office visits: 12/24/20 Dennard Schaumann) - patient with excellent follow up exam.  No changes to medication noted.  Recent consult visits: 12/15/20 Cannon Kettle, podiatry) - diabetic shoe inserts appointment and ordered.  Hospital visits: None in previous 6 months  Objective:  Lab Results  Component Value Date   CREATININE 0.73 12/20/2020   BUN 10  12/20/2020   GFRNONAA 81 12/20/2020   GFRAA 93 12/20/2020   NA 130 (L) 12/20/2020   K 4.2 12/20/2020   CALCIUM 10.1 12/20/2020   CO2 30 12/20/2020   GLUCOSE 88 12/20/2020    Lab Results  Component Value Date/Time   HGBA1C 5.9 (H) 12/20/2020 09:07 AM   HGBA1C 5.9 (H) 02/16/2020 08:33 AM   HGBA1C 5.9 02/18/2019 12:00 AM   MICROALBUR 1.8 12/20/2020 09:07 AM   MICROALBUR 0.4 02/27/2018 12:15 PM    Last diabetic Eye exam:  Lab Results  Component Value Date/Time   HMDIABEYEEXA No Retinopathy 12/28/2014 12:00 AM    Last diabetic Foot exam: No results found for: HMDIABFOOTEX   Lab Results  Component Value Date   CHOL 158 12/20/2020   HDL 68 12/20/2020   LDLCALC 76 12/20/2020   TRIG 62 12/20/2020   CHOLHDL 2.3 12/20/2020    Hepatic Function Latest Ref Rng & Units 12/20/2020 02/16/2020 10/15/2018  Total Protein 6.1 - 8.1 g/dL 7.1 6.7 6.9  Albumin 3.6 - 5.1 g/dL - - -  AST 10 - 35 U/L _0 ALT 6 - 29 U/L _1 Alk Phosphatase 33 - 130 U/L - - -  Total Bilirubin 0.2 - 1.2 mg/dL 1.1 0.7 0.8    Lab Results  Component Value Date/Time   TSH 1.58 12/01/2015 10:48 AM   TSH 1.47 11/11/2015 09:50 AM    CBC Latest Ref Rng & Units 12/20/2020 02/16/2020 10/15/2018  WBC 3.8 - 10.8 Thousand/uL 3.9 4.9 5.8  Hemoglobin 11.7 - 15.5 g/dL 12.3 12.6 12.8  Hematocrit 35.0 - 45.0 %  37.3 38.6 37.5  Platelets 140 - 400 Thousand/uL 221 236 255    Lab Results  Component Value Date/Time   VD25OH 32 11/11/2015 09:50 AM    Clinical ASCVD: No  The 10-year ASCVD risk score Mikey Bussing DC Jr., et al., 2013) is: 28.2%   Values used to calculate the score:     Age: 64 years     Sex: Female     Is Non-Hispanic African American: Yes     Diabetic: Yes     Tobacco smoker: No     Systolic Blood Pressure: 956 mmHg     Is BP treated: Yes     HDL Cholesterol: 68 mg/dL     Total Cholesterol: 158 mg/dL    Depression screen [facility] 2/9 12/24/2020 10/15/2018 02/27/2018  Decreased Interest 0 0 0  Down,  Depressed, Hopeless 0 0 0  PHQ - 2 Score 0 0 0  Altered sleeping - - -  Tired, decreased energy - - -  Change in appetite - - -  Feeling bad or failure about yourself  - - -  Trouble concentrating - - -  Moving slowly or fidgety/restless - - -  Suicidal thoughts - - -  PHQ-9 Score - - -  Difficult doing work/chores - - -  Some recent data might be hidden      Social History   Tobacco Use  Smoking Status Former Smoker  . Quit date: 12/20/2001  . Years since quitting: 19.0  Smokeless Tobacco Never Used   BP Readings from Last 3 Encounters:  12/24/20 124/68  11/12/20 138/70  07/13/20 110/86   Pulse Readings from Last 3 Encounters:  12/24/20 76  11/12/20 72  07/13/20 75   Wt Readings from Last 3 Encounters:  12/24/20 191 lb (86.6 kg)  11/12/20 196 lb 9.6 oz (89.2 kg)  07/13/20 199 lb (90.3 kg)   BMI Readings from Last 3 Encounters:  12/24/20 30.83 kg/m  11/12/20 31.73 kg/m  07/13/20 32.12 kg/m    Assessment/Interventions: Review of patient past medical history, allergies, medications, health status, including review of consultants reports, laboratory and other test data, was performed as part of comprehensive evaluation and provision of chronic care management services.   SDOH:  (Social Determinants of Health) assessments and interventions performed: Yes   Financial Resource Strain: Low Risk   . Difficulty of Paying Living Expenses: Not very hard    SDOH Screenings   Alcohol Screen: Low Risk   . Last Alcohol Screening Score (AUDIT): 0  Depression (PHQ2-9): Low Risk   . PHQ-2 Score: 0  Financial Resource Strain: Low Risk   . Difficulty of Paying Living Expenses: Not very hard  Food Insecurity: Not on file  Housing: Not on file  Physical Activity: Not on file  Social Connections: Not on file  Stress: Not on file  Tobacco Use: Medium Risk  . Smoking Tobacco Use: Former Smoker  . Smokeless Tobacco Use: Never Used  Transportation Needs: Not on file     CCM Care Plan  Allergies  Allergen Reactions  . Ciprofloxacin   . Citalopram Hydrobromide   . Escitalopram Oxalate   . Paroxetine   . Polysporin [Bacitracin-Polymyxin B]     Medications Reviewed Today    Reviewed by Edythe Clarity, [facility] (Pharmacist) on 12/30/20 at [place] Status: <None>  Medication Order Taking? Sig Documenting Provider Last Dose Status Informant  ALPRAZolam (XANAX) 1 MG tablet [id number] Yes Take 1 tablet (1 mg total) by mouth 2 (  two) times daily. [name], MD Taking Active   aspirin 81 MG tablet [id number] Yes Take 81 mg by mouth daily. [provider] Taking Active Self  atorvastatin (LIPITOR) 80 MG tablet [id number] Yes TAKE 1 TABLET BY MOUTH EVERY DAY Hilty, Nadean Corwin, MD Taking Active   cyclobenzaprine (FLEXERIL) 5 MG tablet [id number] Yes Take 1 tablet (5 mg total) by mouth 3 (three) times daily as needed for muscle spasms. [name], MD Taking Active   EPINEPHrine (EPIPEN 2-PAK) 0.3 mg/0.3 mL IJ SOAJ injection [id number] Yes Use as directed for life threatening allergic reactions Garnet Sierras, DO Taking Active   fexofenadine (ALLEGRA) 180 MG tablet [id number] Yes Take 1 tablet (180 mg total) by mouth daily. Orlena Sheldon, PA-C Taking Active Self  fish oil-omega-3 fatty acids 1000 MG capsule [id number] Yes Take 1 g by mouth. Takes occasionally [provider] Taking Active Self  fluticasone (FLONASE) 50 MCG/ACT nasal spray [id number] Yes Place 1-2 sprays into both nostrils daily as needed for allergies or rhinitis. Bobbitt, [place] Muta, MD Taking Active   losartan (COZAAR) 25 MG tablet [id number] Yes Take 50 mg by mouth daily. [provider] Taking Active   metoprolol tartrate (LOPRESSOR) 50 MG tablet [id number] Yes TAKE 1 TABLET EVERY MORNING AND 1 AND 1/2 TABLET EVERY EVENING Hilty, Nadean Corwin, MD Taking Active   Multiple Vitamin (MULTIVITAMIN) tablet [id number] Yes Take 1 tablet by mouth. occasionally [provider] Taking Active Self  nitroGLYCERIN (NITROSTAT) 0.4 MG SL tablet [id number] Yes PLACE 1 TABLET UNDER THE TONGUE EVERY 5 MINUTES AS NEEDED FOR CHEST PAIN. Pixie Casino, MD Taking Active         Discontinued 03/24/13 0840 (Reorder)   pantoprazole (PROTONIX) 40 MG tablet [id number] Yes TAKE 1 TABLET BY MOUTH EVERY DAY [name], MD Taking Active   triamterene-hydrochlorothiazide [facility]) 37.5-25 MG tablet [id number] Yes TAKE 1 TABLET BY MOUTH EVERY DAY [name], MD Taking Active   Wheat Dextrin (BENEFIBER PO) [id number] Yes Take by mouth. Daily [provider] Taking Active   zolpidem (AMBIEN) 10 MG tablet [id number] Yes TAKE 1 TABLET BY MOUTH AT BEDTIME AS NEEDED FOR SLEEP. [name], MD Taking Active           Patient Active Problem List   Diagnosis Date Noted  . Allergic reaction 09/29/2019  . Allergic rhinitis 09/13/2018  . Bilateral high frequency sensorineural hearing loss 01/17/2018  . Subjective tinnitus of left ear 01/17/2018  . Atherosclerosis of native coronary artery of native heart without angina pectoris 07/23/2017  . History of food allergy 01/05/2016  . Angioedema 11/17/2015  . Seasonal and perennial allergic rhinoconjunctivitis 05/04/2015  . Insomnia 08/24/2014  . Bilateral leg edema 04/17/2014  . Diabetes mellitus without complication ([place])   . Essential hypertension   . Gout   . Mixed hyperlipidemia   . 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 Anxiety   . Obesity   . Knee pain   . Positive TB test   . Hiatal hernia   . GERD (gastroesophageal reflux disease)   . Coronary atherosclerosis 06/06/2010  . GERD 06/06/2010  . History of colonic polyps 06/06/2010    Immunization History  Administered Date(s) Administered  . Fluad Quad(high Dose 65+) 05/20/2019  . H1N1 08/03/2008  . Influenza Whole 05/28/2008, 07/07/2011  . Influenza, High Dose Seasonal PF 06/04/2017, 05/29/2018, 07/14/2020  . Influenza,inj,Quad PF,6+ Mos 05/28/2013,  07/02/2014, 06/01/2015, 05/24/2016  . Influenza-Unspecified 06/09/2008, 06/03/2009, 05/19/2010  .  Pneumococcal Conjugate-13 08/18/2013  . Pneumococcal Polysaccharide-23 02/25/2014  . Td 05/13/2007, 01/25/2012  . Tdap 01/25/2012  . Unspecified SARS-COV-2 Vaccination 10/20/2019, 10/31/2019, 06/28/2020  . Zoster 07/02/2014    Conditions to be addressed/monitored:  HT N, GERD, Diabetes, HLD, Anxiety, insomnia.  Care Plan : General Pharmacy (Adult)  Updates made by Edythe Clarity, RPH since 12/30/2020 12:00 AM    Problem: HTN, HLD, DM   Priority: High  Onset Date: 12/29/2020    Long-Range Goal: Patient-Specific Goal   Start Date: 12/29/2020  Expected End Date: 07/02/2021  This Visit's Progress: On track  Priority: High  Note:   Current Barriers:  . Unable to independently monitor therapeutic efficacy  Pharmacist Clinical Goal(s):  [name] Patient will achieve adherence to monitoring guidelines and medication adherence to achieve therapeutic efficacy . maintain control of glucose, BP, and lipids as evidenced by routine follow up  . contact provider office for questions/concerns as evidenced notation of same in electronic health record through collaboration with PharmD and provider.   Interventions: . 1:1 collaboration with [name], MD regarding development and update of comprehensive plan of care as evidenced by provider attestation and co-signature . Inter-disciplinary care team collaboration (see longitudinal plan of care) . Comprehensive medication review performed; medication list updated in electronic medical record  Hypertension (BP goal <140/90) -Controlled -Current treatment: . Losartan 74m two tablets daily . Metoprolol tartrate 547mtake one tablet po qam and one-half qpm  Triamterene/HCTZ 37.5-25mg -Medications previously tried: none noted   -Current home readings: patient is not checking currently -Current dietary habits: nothing specific, watching her fats and  carbs -Current exercise habits: minimal, plans to increase exercise as her next lifestyle change -Denies hypotensive/hypertensive symptoms -Educated on BP goals and benefits of medications for prevention of heart attack, stroke and kidney damage; Exercise goal of 150 minutes per week; Importance of home blood pressure monitoring; -Counseled to monitor BP at home weekly, document, and provide log at future appointments -Counseled on diet and exercise extensively Recommended to continue current medication  Hyperlipidemia: (LDL goal < 70) -Controlled -Current treatment: . Atorvastatin 8087maily -Medications previously tried: none noted  -LDL controlled at most recent physical -Educated on Cholesterol goals;  Benefits of statin for ASCVD risk reduction; Importance of limiting foods high in cholesterol; Exercise goal of 150 minutes per week; -Recommended to continue current medication  Diabetes (A1c goal <6.5%) -Controlled -Current medications: . None -Medications previously tried: none noted  -Current home glucose readings . fasting glucose: not checking . post prandial glucose: not checking -Denies hypoglycemic/hyperglycemic symptoms -Current exercise: minimal currently, plans to implement PA plan as part of next lifestyle change -Educated on A1c and blood sugar goals; Complications of diabetes including kidney damage, retinal damage, and cardiovascular disease; Exercise goal of 150 minutes per week; Benefits of weight loss; -Counseled to check feet daily and get yearly eye exams -Recommended to continue current strategies, implement PA plan with goal of 150 minutes per week of exercise.  Insomnia (Goal: Sleep Hygeine) -Controlled -Current treatment  . Zolpidem 97m33medications previously tried: none noted -Still taking half a tablet prn, does not like to take this all the time -Reports sleep has been pretty stable lately  -Recommended to continue current  medication   Patient Goals/Self-Care Activities . Patient will:  - take medications as prescribed check blood pressure weekly, document, and provide at future appointments target a minimum of 150 minutes of moderate intensity exercise weekly  Follow Up Plan: The care management team will  reach out to the patient again over the next 180 days.        Medication Assistance: None required.  Patient affirms current coverage meets needs.  Patient's preferred pharmacy is:  [id number] [place] [place]Phone: [phone]: [phone] [id number] [place]: [phone]: [id number] [place] [place] [id number]: [phone]: [phone] pill box? No - prefers vials Pt endorses 100% compliance  We discussed: Benefits of medication synchronization, packaging and delivery as well as enhanced pharmacist oversight with [employer]. Patient decided to: Continue current medication management strategy  Care Plan and Follow Up Patient Decision:  Patient agrees to Care Plan and Follow-up.  Plan: The care management team will reach out to the patient again over the next 180 days.  ChrBeverly MilchharmD Clinical Pharmacist [phone]

## 2020-12-29 ENCOUNTER — Ambulatory Visit (INDEPENDENT_AMBULATORY_CARE_PROVIDER_SITE_OTHER): Payer: Medicare Other | Admitting: Pharmacist

## 2020-12-29 DIAGNOSIS — E119 Type 2 diabetes mellitus without complications: Secondary | ICD-10-CM | POA: Diagnosis not present

## 2020-12-29 DIAGNOSIS — E782 Mixed hyperlipidemia: Secondary | ICD-10-CM

## 2020-12-29 DIAGNOSIS — I10 Essential (primary) hypertension: Secondary | ICD-10-CM

## 2020-12-29 DIAGNOSIS — G4700 Insomnia, unspecified: Secondary | ICD-10-CM

## 2020-12-30 ENCOUNTER — Ambulatory Visit (INDEPENDENT_AMBULATORY_CARE_PROVIDER_SITE_OTHER): Payer: Medicare Other | Admitting: *Deleted

## 2020-12-30 DIAGNOSIS — J309 Allergic rhinitis, unspecified: Secondary | ICD-10-CM

## 2020-12-30 NOTE — Patient Instructions (Addendum)
Visit Information  Goals Addressed            This Visit's Progress   . Lifestyle Change-Hypertension       Timeframe:  Long-Range Goal Priority:  High Start Date:      12/29/20                       Expected End Date:    07/01/21                   Follow Up Date 03/31/21   - agree to work together to make changes - learn about high blood pressure    Why is this important?    The changes that you are asked to make may be hard to do.   This is especially true when the changes are life-long.   Knowing why it is important to you is the first step.   Working on the change with your family or support person helps you not feel alone.   Reward yourself and family or support person when goals are met. This can be an activity you choose like bowling, hiking, biking, swimming or shooting hoops.     Notes: Implement physical activity plan with target 150 minutes per week!!      Patient Care Plan: General Pharmacy (Adult)    Problem Identified: HTN, HLD, DM   Priority: High  Onset Date: 12/29/2020    Long-Range Goal: Patient-Specific Goal   Start Date: 12/29/2020  Expected End Date: 07/02/2021  This Visit's Progress: On track  Priority: High  Note:   Current Barriers:  . Unable to independently monitor therapeutic efficacy  Pharmacist Clinical Goal(s):  [name] Patient will achieve adherence to monitoring guidelines and medication adherence to achieve therapeutic efficacy . maintain control of glucose, BP, and lipids as evidenced by routine follow up  . contact provider office for questions/concerns as evidenced notation of same in electronic health record through collaboration with PharmD and provider.   Interventions: . 1:1 collaboration with [name], MD regarding development and update of comprehensive plan of care as evidenced by provider attestation and co-signature . Inter-disciplinary care team collaboration (see longitudinal plan of care) . Comprehensive  medication review performed; medication list updated in electronic medical record  Hypertension (BP goal <140/90) -Controlled -Current treatment: . Losartan 69m two tablets daily . Metoprolol tartrate 561mtake one tablet po qam and one-half qpm  Triamterene/HCTZ 37.5-25mg -Medications previously tried: none noted   -Current home readings: patient is not checking currently -Current dietary habits: nothing specific, watching her fats and carbs -Current exercise habits: minimal, plans to increase exercise as her next lifestyle change -Denies hypotensive/hypertensive symptoms -Educated on BP goals and benefits of medications for prevention of heart attack, stroke and kidney damage; Exercise goal of 150 minutes per week; Importance of home blood pressure monitoring; -Counseled to monitor BP at home weekly, document, and provide log at future appointments -Counseled on diet and exercise extensively Recommended to continue current medication  Hyperlipidemia: (LDL goal < 70) -Controlled -Current treatment: . Atorvastatin 8030maily -Medications previously tried: none noted  -LDL controlled at most recent physical -Educated on Cholesterol goals;  Benefits of statin for ASCVD risk reduction; Importance of limiting foods high in cholesterol; Exercise goal of 150 minutes per week; -Recommended to continue current medication  Diabetes (A1c goal <6.5%) -Controlled -Current medications: . None -Medications previously tried: none noted  -Current home glucose readings . fasting glucose: not checking . post  prandial glucose: not checking -Denies hypoglycemic/hyperglycemic symptoms -Current exercise: minimal currently, plans to implement PA plan as part of next lifestyle change -Educated on A1c and blood sugar goals; Complications of diabetes including kidney damage, retinal damage, and cardiovascular disease; Exercise goal of 150 minutes per week; Benefits of weight loss; -Counseled to  check feet daily and get yearly eye exams -Recommended to continue current strategies, implement PA plan with goal of 150 minutes per week of exercise.  Insomnia (Goal: Sleep Hygeine) -Controlled -Current treatment  . Zolpidem 4m -Medications previously tried: none noted -Still taking half a tablet prn, does not like to take this all the time -Reports sleep has been pretty stable lately  -Recommended to continue current medication   Patient Goals/Self-Care Activities . Patient will:  - take medications as prescribed check blood pressure weekly, document, and provide at future appointments target a minimum of 150 minutes of moderate intensity exercise weekly  Follow Up Plan: The care management team will reach out to the patient again over the next 180 days.        The patient verbalized understanding of instructions, educational materials, and care plan provided today and declined offer to receive copy of patient instructions, educational materials, and care plan.  Telephone follow up appointment with pharmacy team member scheduled for: 6 months  [facility] Diabetes [facility] and Exercise Exercising regularly is important for overall health, especially for people who have diabetes mellitus. Exercising is not only about losing weight. It has many other health benefits, such as increasing muscle strength and bone density and reducing body fat and stress. This leads to improved fitness, flexibility, and endurance, all of which result in better overall health. What are the benefits of exercise if I have diabetes? Exercise has many benefits for people with diabetes. They include:  Helping to lower and control blood sugar (glucose).  Helping the body to respond better to the hormone insulin by improving insulin sensitivity.  Reducing how much insulin the body needs.  Lowering the risk for heart disease by: ? Lowering "bad" cholesterol and triglyceride  levels. ? Increasing "good" cholesterol levels. ? Lowering blood pressure. ? Lowering blood glucose levels. What is my activity plan? Your health care provider or certified diabetes educator can help you make a plan for the type and frequency of exercise that works for you. This is called your activity plan. Be sure to:  Get at least 150 minutes of medium-intensity or high-intensity exercise each week. Exercises may include brisk walking, biking, or water aerobics.  Do stretching and strengthening exercises, such as yoga or weight lifting, at least 2 times a week.  Spread out your activity over at least 3 days of the week.  Get some form of physical activity each day. ? Do not go more than 2 days in a row without some kind of physical activity. ? Avoid being inactive for more than 90 minutes at a time. Take frequent breaks to walk or stretch.  Choose exercises or activities that you enjoy. Set realistic goals.  Start slowly and gradually increase your exercise intensity over time.   How do I manage my diabetes during exercise? Monitor your blood glucose  Check your blood glucose before and after exercising. If your blood glucose is: ? 240 mg/dL (13.3 mmol/L) or higher before you exercise, check your urine for ketones. These are chemicals created by the liver. If you have ketones in your urine, do not exercise until your blood glucose returns to  normal. ? 100 mg/dL (5.6 mmol/L) or lower, eat a snack containing 15-20 grams of carbohydrate. Check your blood glucose 15 minutes after the snack to make sure that your glucose level is above 100 mg/dL (5.6 mmol/L) before you start your exercise.  Know the symptoms of low blood glucose (hypoglycemia) and how to treat it. Your risk for hypoglycemia increases during and after exercise. Follow these tips and your health care provider's instructions  Keep a carbohydrate snack that is fast-acting for use before, during, and after exercise to help  prevent or treat hypoglycemia.  Avoid injecting insulin into areas of the body that are going to be exercised. For example, avoid injecting insulin into: ? Your arms, when you are about to play tennis. ? Your legs, when you are about to go jogging.  Keep records of your exercise habits. Doing this can help you and your health care provider adjust your diabetes management plan as needed. Write down: ? Food that you eat before and after you exercise. ? Blood glucose levels before and after you exercise. ? The type and amount of exercise you have done.  Work with your health care provider when you start a new exercise or activity. He or she may need to: ? Make sure that the activity is safe for you. ? Adjust your insulin, other medicines, and food that you eat.  Drink plenty of water while you exercise. This prevents loss of water (dehydration) and problems caused by a lot of heat in the body (heat stroke).   Where to find more information  American Diabetes Association: www.diabetes.org Summary  Exercising regularly is important for overall health, especially for people who have diabetes mellitus.  Exercising has many health benefits. It increases muscle strength and bone density and reduces body fat and stress. It also lowers and controls blood glucose.  Your health care provider or certified diabetes educator can help you make an activity plan for the type and frequency of exercise that works for you.  Work with your health care provider to make sure any new activity is safe for you. Also work with your health care provider to adjust your insulin, other medicines, and the food you eat. This information is not intended to replace advice given to you by your health care provider. Make sure you discuss any questions you have with your health care provider. Document Revised: 05/12/2019 Document Reviewed: 05/12/2019 Elsevier Patient Education  [place].

## 2021-01-10 ENCOUNTER — Other Ambulatory Visit: Payer: Self-pay | Admitting: Internal Medicine

## 2021-01-13 ENCOUNTER — Ambulatory Visit (INDEPENDENT_AMBULATORY_CARE_PROVIDER_SITE_OTHER): Payer: Medicare Other

## 2021-01-13 DIAGNOSIS — J309 Allergic rhinitis, unspecified: Secondary | ICD-10-CM | POA: Diagnosis not present

## 2021-01-27 ENCOUNTER — Ambulatory Visit (INDEPENDENT_AMBULATORY_CARE_PROVIDER_SITE_OTHER): Payer: Medicare Other | Admitting: *Deleted

## 2021-01-27 DIAGNOSIS — J309 Allergic rhinitis, unspecified: Secondary | ICD-10-CM | POA: Diagnosis not present

## 2021-02-03 ENCOUNTER — Ambulatory Visit (INDEPENDENT_AMBULATORY_CARE_PROVIDER_SITE_OTHER): Payer: Medicare Other | Admitting: *Deleted

## 2021-02-03 DIAGNOSIS — J309 Allergic rhinitis, unspecified: Secondary | ICD-10-CM | POA: Diagnosis not present

## 2021-02-10 ENCOUNTER — Ambulatory Visit (INDEPENDENT_AMBULATORY_CARE_PROVIDER_SITE_OTHER): Payer: Medicare Other | Admitting: Sports Medicine

## 2021-02-10 ENCOUNTER — Ambulatory Visit (INDEPENDENT_AMBULATORY_CARE_PROVIDER_SITE_OTHER): Payer: Medicare Other | Admitting: *Deleted

## 2021-02-10 ENCOUNTER — Encounter: Payer: Self-pay | Admitting: Sports Medicine

## 2021-02-10 ENCOUNTER — Other Ambulatory Visit: Payer: Self-pay

## 2021-02-10 DIAGNOSIS — I739 Peripheral vascular disease, unspecified: Secondary | ICD-10-CM

## 2021-02-10 DIAGNOSIS — M79675 Pain in left toe(s): Secondary | ICD-10-CM | POA: Diagnosis not present

## 2021-02-10 DIAGNOSIS — J309 Allergic rhinitis, unspecified: Secondary | ICD-10-CM

## 2021-02-10 DIAGNOSIS — E119 Type 2 diabetes mellitus without complications: Secondary | ICD-10-CM

## 2021-02-10 DIAGNOSIS — M79674 Pain in right toe(s): Secondary | ICD-10-CM

## 2021-02-10 DIAGNOSIS — B351 Tinea unguium: Secondary | ICD-10-CM | POA: Diagnosis not present

## 2021-02-10 DIAGNOSIS — Z9229 Personal history of other drug therapy: Secondary | ICD-10-CM

## 2021-02-10 NOTE — Progress Notes (Signed)
Subjective: Madeline Wood is a 75 y.o. female patient seen today in office with complaint of mildly painful thickened and elongated toenails; unable to trim.  Last A1c not recorded and does not check her blood sugars diet controlled with no new issues.  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   Subjective tinnitus of left ear 01/17/2018   Atherosclerosis of native coronary artery of native heart without angina pectoris 07/23/2017   History of food allergy 01/05/2016   Angioedema 11/17/2015   Seasonal and perennial allergic rhinoconjunctivitis 05/04/2015   Insomnia 08/24/2014   Bilateral leg edema 04/17/2014   Diabetes mellitus without complication ([place])    Essential hypertension    Gout    Mixed hyperlipidemia    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 60 tablet 2   aspirin 81 MG tablet Take 81 mg by mouth daily.     atorvastatin (LIPITOR) 80 MG tablet TAKE 1 TABLET BY MOUTH EVERY DAY 90 tablet 2   cyclobenzaprine (FLEXERIL) 5 MG tablet Take 1 tablet (5 mg total) by mouth 3 (three) times daily as needed for muscle spasms. 20 tablet 0   EPINEPHrine (EPIPEN 2-PAK) 0.3 mg/0.3 mL IJ SOAJ injection Use as directed for life threatening allergic reactions 1 each 3   fexofenadine (ALLEGRA) 180 MG tablet Take 1 tablet (180 mg total) by mouth daily. 30 tablet 5   fish oil-omega-3 fatty acids 1000 MG capsule Take 1 g by mouth. Takes occasionally     fluticasone (FLONASE) 50 MCG/ACT  nasal spray Place 1-2 sprays into both nostrils daily as needed for allergies or rhinitis. 48 g 3   losartan (COZAAR) 25 MG tablet TAKE 2 TABLETS (50MG ) BY MOUTH EVERY DAY 180 tablet 3   metoprolol tartrate (LOPRESSOR) 50 MG tablet TAKE 1 TABLET EVERY MORNING AND 1 AND 1/2 TABLET EVERY EVENING 225 tablet 1   Multiple Vitamin (MULTIVITAMIN) tablet Take 1 tablet by mouth. occasionally     nitroGLYCERIN (NITROSTAT) 0.4 MG SL tablet PLACE 1 TABLET UNDER THE TONGUE EVERY 5 MINUTES AS NEEDED FOR CHEST PAIN. 75 tablet 1   pantoprazole (PROTONIX) 40 MG tablet TAKE 1 TABLET BY MOUTH EVERY DAY 90 tablet 1   triamterene-hydrochlorothiazide (MAXZIDE-25) 37.5-25 MG tablet TAKE 1 TABLET BY MOUTH EVERY DAY 90 tablet 2   Wheat Dextrin (BENEFIBER PO) Take by mouth. Daily     zolpidem (AMBIEN) 10 MG tablet TAKE 1 TABLET BY MOUTH AT BEDTIME AS NEEDED FOR SLEEP. 30 tablet 0   [DISCONTINUED] omeprazole (PRILOSEC OTC) 20 MG tablet Take 1 tablet (20 mg total) by mouth daily. 30 tablet 1   No current facility-administered medications on file prior to visit.    Allergies  Allergen Reactions   Ciprofloxacin    Citalopram Hydrobromide    Escitalopram Oxalate    Paroxetine    Polysporin [Bacitracin-Polymyxin B]     Objective: Physical Exam  General: Well developed, nourished,  no acute distress, awake, alert and oriented x 3  Vascular: Dorsalis pedis artery 1/4 bilateral, Posterior tibial artery 1/4 bilateral, skin temperature warm to warm proximal to distal bilateral lower extremities, no varicosities, pedal hair present bilateral.  Neurological: Gross sensation present via light touch bilateral.   Dermatological: Skin is warm, dry, and supple bilateral, Nails 1-10 are tender, long, thick, and discolored with mild subungal debris, no acute ingrowing noted, no webspace macerations present bilateral, no open lesions present bilateral, no callus/corns/hyperkeratotic tissue present bilateral. No signs of infection  bilateral.  Musculoskeletal: Asymptomatic pes planus and bunion boney deformities noted bilateral. Muscular strength within normal limits without painon range of motion. No pain with calf compression bilateral.  Assessment and Plan:  Problem List Items Addressed This Visit       Endocrine   Diabetes mellitus without complication ([place])   Other Visit Diagnoses     Pain due to onychomycosis of toenails of both feet    -  Primary   PVD (peripheral vascular disease) ([place])       HX: long term anticoagulant use          -Examined patient.  -Re-Discussed treatment options for painful mycotic nails -Mechanically debrided and reduced mycotic nails with sterile nail nipper and dremel nail file without incident.  -Patient to return in 2.5  months for follow up evaluation or sooner if symptoms worsen.  [name], [name] Subjective: Madeline Wood is a 75 y.o. female patient seen today in office with complaint of mildly painful thickened and elongated toenails; unable to trim.  Reports that her toes are doing better after getting her nails trim denies any changes with medication or any pedal complaints at this time.  Last A1c seen with recorded at 5.9 in June 2020 and does not check her blood sugars currently monitors because consider now prediabetic.  Denies any other pedal complaints at this time.  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   Subjective tinnitus of left ear 01/17/2018   Atherosclerosis of native coronary artery of native heart without angina pectoris 07/23/2017   History of food allergy 01/05/2016   Angioedema 11/17/2015   Seasonal and perennial allergic rhinoconjunctivitis 05/04/2015   Insomnia 08/24/2014   Bilateral leg edema 04/17/2014   Diabetes mellitus without complication ([place])    Essential hypertension    Gout    Mixed  hyperlipidemia    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 60 tablet 2   aspirin 81 MG tablet Take 81 mg by mouth daily.     atorvastatin (LIPITOR) 80 MG tablet TAKE 1 TABLET BY MOUTH EVERY DAY 90 tablet 2   cyclobenzaprine (FLEXERIL) 5 MG tablet Take 1 tablet (5 mg total) by mouth 3 (three) times daily as needed for muscle spasms. 20 tablet 0   EPINEPHrine (EPIPEN 2-PAK) 0.3 mg/0.3 mL IJ SOAJ injection Use as directed for life threatening allergic reactions 1  each 3   fexofenadine (ALLEGRA) 180 MG tablet Take 1 tablet (180 mg total) by mouth daily. 30 tablet 5   fish oil-omega-3 fatty acids 1000 MG capsule Take 1 g by mouth. Takes occasionally     fluticasone (FLONASE) 50 MCG/ACT nasal spray Place 1-2 sprays into both nostrils daily as needed for allergies or rhinitis. 48 g 3   losartan (COZAAR) 25 MG tablet TAKE 2 TABLETS (50MG ) BY MOUTH EVERY DAY 180 tablet 3   metoprolol tartrate (LOPRESSOR) 50 MG tablet TAKE 1 TABLET EVERY MORNING AND 1 AND 1/2 TABLET EVERY EVENING 225 tablet 1   Multiple Vitamin (MULTIVITAMIN) tablet Take 1 tablet by mouth. occasionally     nitroGLYCERIN (NITROSTAT) 0.4 MG SL tablet PLACE 1 TABLET UNDER THE TONGUE EVERY 5 MINUTES AS NEEDED FOR CHEST PAIN. 75 tablet 1   pantoprazole (PROTONIX) 40 MG tablet TAKE 1 TABLET BY MOUTH EVERY DAY 90 tablet 1   triamterene-hydrochlorothiazide (MAXZIDE-25) 37.5-25 MG tablet TAKE 1 TABLET BY MOUTH EVERY DAY 90 tablet 2   Wheat Dextrin (BENEFIBER PO) Take by mouth. Daily     zolpidem (AMBIEN) 10 MG tablet TAKE 1 TABLET BY MOUTH AT BEDTIME AS NEEDED FOR SLEEP. 30  tablet 0   [DISCONTINUED] omeprazole (PRILOSEC OTC) 20 MG tablet Take 1 tablet (20 mg total) by mouth daily. 30 tablet 1   No current facility-administered medications on file prior to visit.    Allergies  Allergen Reactions   Ciprofloxacin    Citalopram Hydrobromide    Escitalopram Oxalate    Paroxetine    Polysporin [Bacitracin-Polymyxin B]     Objective: Physical Exam  General: Well developed, nourished, no acute distress, awake, alert and oriented x 3  Vascular: Dorsalis pedis artery 1/4 bilateral, Posterior tibial artery 1/4 bilateral, skin temperature warm to warm proximal to distal bilateral lower extremities, no varicosities, pedal hair present bilateral.  Neurological: Gross sensation present via light touch bilateral.   Dermatological: Skin is warm, dry, and supple bilateral, Nails 1-10 are tender, long, thick, and discolored with mild subungal debris, no acute ingrowing noted, no webspace macerations present bilateral, no open lesions present bilateral, no callus/corns/hyperkeratotic tissue present bilateral. No signs of infection bilateral.  Musculoskeletal: Asymptomatic pes planus and bunion boney deformities noted bilateral. Muscular strength within normal limits without painon range of motion. No pain with calf compression bilateral.  Assessment and Plan:  Problem List Items Addressed This Visit       Endocrine   Diabetes mellitus without complication ([place])   Other Visit Diagnoses     Pain due to onychomycosis of toenails of both feet    -  Primary   PVD (peripheral vascular disease) ([place])       HX: long term anticoagulant use          -Examined patient.  -Re-Discussed treatment options for painful mycotic nails -Mechanically debrided and reduced mycotic nails with sterile nail nipper and dremel nail file without incident.  -Patient to return in 2.5  months for follow up evaluation or sooner if symptoms worsen.  [name], [name] Subjective: Madeline Wood is a 75 y.o. female patient seen today in office with complaint of mildly painful thickened and elongated toenails; unable to trim.  Reports that her toes are doing better after getting her nails trim denies any changes with medication or any pedal complaints at this time.  Last A1c seen with recorded at 5.9 in June 2020 and does not check her blood sugars currently monitors  because consider now prediabetic.  Denies any other pedal complaints at this time.  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   Subjective tinnitus of left ear 01/17/2018   Atherosclerosis of native coronary artery of native heart without angina pectoris 07/23/2017   History of food allergy 01/05/2016   Angioedema 11/17/2015   Seasonal and perennial allergic rhinoconjunctivitis 05/04/2015   Insomnia 08/24/2014   Bilateral leg edema 04/17/2014   Diabetes mellitus without complication ([facility])    Essential hypertension    Gout    Mixed hyperlipidemia    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 60 tablet 2   aspirin 81 MG tablet Take 81 mg by mouth daily.     atorvastatin (LIPITOR) 80 MG tablet TAKE 1 TABLET BY MOUTH EVERY DAY 90 tablet 2   cyclobenzaprine (FLEXERIL) 5 MG tablet Take 1 tablet (5 mg total) by mouth 3 (three) times daily as needed for muscle spasms. 20 tablet 0   EPINEPHrine (EPIPEN 2-PAK) 0.3 mg/0.3 mL IJ SOAJ injection Use as directed for life threatening allergic reactions 1 each 3    fexofenadine (ALLEGRA) 180 MG tablet Take 1 tablet (180 mg total) by mouth daily. 30 tablet 5   fish oil-omega-3 fatty acids 1000 MG capsule Take 1 g by mouth. Takes occasionally     fluticasone (FLONASE) 50 MCG/ACT nasal spray Place 1-2 sprays into both nostrils daily as needed for allergies or rhinitis. 48 g 3   losartan (COZAAR) 25 MG tablet TAKE 2 TABLETS (50MG ) BY MOUTH EVERY DAY 180 tablet 3   metoprolol tartrate (LOPRESSOR) 50 MG tablet TAKE 1 TABLET EVERY MORNING AND 1 AND 1/2 TABLET EVERY EVENING 225 tablet 1   Multiple Vitamin (MULTIVITAMIN) tablet Take 1 tablet by mouth. occasionally     nitroGLYCERIN (NITROSTAT) 0.4 MG SL tablet PLACE 1 TABLET UNDER THE TONGUE EVERY 5 MINUTES AS NEEDED FOR CHEST PAIN. 75 tablet 1   pantoprazole (PROTONIX) 40 MG tablet TAKE 1 TABLET BY MOUTH EVERY DAY 90 tablet 1   triamterene-hydrochlorothiazide (MAXZIDE-25) 37.5-25 MG tablet TAKE 1 TABLET BY MOUTH EVERY DAY 90 tablet 2   Wheat Dextrin (BENEFIBER PO) Take by mouth. Daily     zolpidem (AMBIEN) 10 MG tablet TAKE 1 TABLET BY MOUTH AT BEDTIME AS NEEDED FOR SLEEP. 30 tablet 0   [DISCONTINUED] omeprazole (PRILOSEC OTC) 20 MG tablet Take 1 tablet (20 mg total) by mouth daily. 30 tablet 1   No current facility-administered medications on file prior to visit.    Allergies  Allergen Reactions   Ciprofloxacin    Citalopram Hydrobromide    Escitalopram Oxalate    Paroxetine    Polysporin [Bacitracin-Polymyxin B]     Objective: Physical Exam  General: Well developed, nourished, no acute distress, awake, alert and oriented x 3  Vascular: Dorsalis pedis artery 1/4 bilateral, Posterior tibial artery 1/4 bilateral, skin temperature warm to warm proximal to distal bilateral lower  extremities, no varicosities, pedal hair present bilateral.  Neurological: Gross sensation present via light touch bilateral.   Dermatological: Skin is warm, dry, and supple bilateral, Nails 1-10 are tender, long, thick, and  discolored with mild subungal debris, no acute ingrowing noted, no webspace macerations present bilateral, no open lesions present bilateral, no callus/corns/hyperkeratotic tissue present bilateral. No signs of infection bilateral.  Musculoskeletal: Asymptomatic pes planus and bunion boney deformities noted bilateral. Muscular strength within normal limits without painon range of motion. No pain with calf compression bilateral.  Assessment and Plan:  Problem List Items Addressed This Visit       Endocrine   Diabetes mellitus without complication ([place])   Other Visit Diagnoses     Pain due to onychomycosis of toenails of both feet    -  Primary   PVD (peripheral vascular disease) ([place])       HX: long term anticoagulant use          -Examined patient.  -Re-Discussed treatment options for painful mycotic nails like previous -Mechanically debrided and reduced mycotic nails with sterile nail nipper and dremel nail file without incident.  -Patient awaiting diabetic shoes -Patient to return in 2.5 to 3 months for follow up evaluation or sooner if symptoms worsen.  [name], [name]

## 2021-02-12 ENCOUNTER — Other Ambulatory Visit: Payer: Self-pay | Admitting: Family Medicine

## 2021-02-18 ENCOUNTER — Ambulatory Visit (INDEPENDENT_AMBULATORY_CARE_PROVIDER_SITE_OTHER): Payer: Medicare Other

## 2021-02-18 DIAGNOSIS — J309 Allergic rhinitis, unspecified: Secondary | ICD-10-CM

## 2021-02-24 ENCOUNTER — Ambulatory Visit (INDEPENDENT_AMBULATORY_CARE_PROVIDER_SITE_OTHER): Payer: Medicare Other | Admitting: *Deleted

## 2021-02-24 DIAGNOSIS — J309 Allergic rhinitis, unspecified: Secondary | ICD-10-CM

## 2021-03-10 ENCOUNTER — Ambulatory Visit (INDEPENDENT_AMBULATORY_CARE_PROVIDER_SITE_OTHER): Payer: Medicare Other | Admitting: *Deleted

## 2021-03-10 DIAGNOSIS — J309 Allergic rhinitis, unspecified: Secondary | ICD-10-CM

## 2021-03-21 ENCOUNTER — Telehealth: Payer: Self-pay | Admitting: Family Medicine

## 2021-03-21 NOTE — Telephone Encounter (Signed)
Received voicemail from Harless Nakayama with [employer] and [place] in [place] to follow up on request for billing records from 06/2017 to present. Please advise at [phone].  Also requested medical records from 06/2017 to present. Contacted and gave the phone number to [employer] (left message on her voicemail; she's out of the office today)

## 2021-03-25 ENCOUNTER — Ambulatory Visit (INDEPENDENT_AMBULATORY_CARE_PROVIDER_SITE_OTHER): Payer: Medicare Other

## 2021-03-25 DIAGNOSIS — J309 Allergic rhinitis, unspecified: Secondary | ICD-10-CM | POA: Diagnosis not present

## 2021-03-26 ENCOUNTER — Other Ambulatory Visit: Payer: Self-pay | Admitting: Family Medicine

## 2021-03-26 ENCOUNTER — Other Ambulatory Visit: Payer: Self-pay | Admitting: Internal Medicine

## 2021-03-26 DIAGNOSIS — K219 Gastro-esophageal reflux disease without esophagitis: Secondary | ICD-10-CM

## 2021-03-29 ENCOUNTER — Other Ambulatory Visit: Payer: Self-pay

## 2021-03-29 MED ORDER — FLUTICASONE PROPIONATE 50 MCG/ACT NA SUSP: 1.0000 | Freq: Every day | NASAL | 5 refills | Status: DC | PRN

## 2021-03-29 NOTE — Telephone Encounter (Signed)
Patient is not due back until 11/12/2021 for an office visit. I sent in Flonase nasal spray with refills. Patient did not get refills on her last visit.

## 2021-03-29 NOTE — Telephone Encounter (Signed)
I spoke with Madeline Wood at [facility] and [facility] in [place] and advised her that the billing statements had been sent already in 11/23/20 she states that she doesn't have them and requested I resend. I have printed and faxed to her at [phone].

## 2021-03-31 ENCOUNTER — Telehealth: Payer: Self-pay | Admitting: Pharmacist

## 2021-03-31 NOTE — Progress Notes (Addendum)
    Chronic Care Management Pharmacy Assistant   Name: ZOBIA MATTERS  MRN: [id number] DOB: 09-Nov-1945  Reason for Encounter: General Disease State Call   Conditions to be addressed/monitored: HTN, GERD, pre-Diabetes, HLD, Anxiety, insomnia.  Recent office visits:  None since 12/29/20  Recent consult visits:  02/10/21 Donalee Citrin, [place], [name]. For nail problem No medication changes.   Hospital visits:  None since 12/29/20  Medications: Outpatient Encounter Medications as of 03/31/2021  Medication Sig   ALPRAZolam (XANAX) 1 MG tablet Take 1 tablet (1 mg total) by mouth 2 (two) times daily.   aspirin 81 MG tablet Take 81 mg by mouth daily.   atorvastatin (LIPITOR) 80 MG tablet TAKE 1 TABLET BY MOUTH EVERY DAY   cyclobenzaprine (FLEXERIL) 5 MG tablet TAKE 1 TABLET BY MOUTH THREE TIMES A DAY AS NEEDED FOR MUSCLE SPASMS   EPINEPHrine (EPIPEN 2-PAK) 0.3 mg/0.3 mL IJ SOAJ injection Use as directed for life threatening allergic reactions   fexofenadine (ALLEGRA) 180 MG tablet Take 1 tablet (180 mg total) by mouth daily.   fish oil-omega-3 fatty acids 1000 MG capsule Take 1 g by mouth. Takes occasionally   fluticasone (FLONASE) 50 MCG/ACT nasal spray Place 1-2 sprays into both nostrils daily as needed for allergies or rhinitis.   losartan (COZAAR) 25 MG tablet TAKE 2 TABLETS ('50MG'$ ) BY MOUTH EVERY DAY   metoprolol tartrate (LOPRESSOR) 50 MG tablet TAKE 1 TABLET EVERY MORNING AND 1 AND 1/2 TABLET EVERY EVENING   Multiple Vitamin (MULTIVITAMIN) tablet Take 1 tablet by mouth. occasionally   nitroGLYCERIN (NITROSTAT) 0.4 MG SL tablet PLACE 1 TABLET UNDER THE TONGUE EVERY 5 MINUTES AS NEEDED FOR CHEST PAIN.   pantoprazole (PROTONIX) 40 MG tablet TAKE 1 TABLET BY MOUTH EVERY DAY   triamterene-hydrochlorothiazide (MAXZIDE-25) 37.5-25 MG tablet TAKE 1 TABLET BY MOUTH EVERY DAY   Wheat Dextrin (BENEFIBER PO) Take by mouth. Daily   zolpidem (AMBIEN) 10 MG tablet TAKE 1 TABLET BY MOUTH AT BEDTIME AS  NEEDED FOR SLEEP.   [DISCONTINUED] omeprazole (PRILOSEC OTC) 20 MG tablet Take 1 tablet (20 mg total) by mouth daily.   No facility-administered encounter medications on file as of 03/31/2021.   GEN CALL: Patient stated she has been doing well. She stated she is  working on her diet and since then she has been loosing some weight. She stated she does not have any questions or concerns about her medication at this time. She stated she is still getting around on her own. We discussed her upcoming appointments with the Dr. Dennard Schaumann and the pharmacist Leata Mouse.   Star Rating Drugs: Losartan 25 mg  90 DS 12/30/20, Atorvastatin 80 mg 90 DS 02/19/21.  Follow-Up:Pharmacist Review  Charlann Lange, RMA Clinical Pharmacist Assistant [phone]  10 minutes spent in review, coordination, and documentation.  Reviewed by: Beverly Milch, PharmD Clinical Pharmacist [phone]

## 2021-04-08 ENCOUNTER — Ambulatory Visit (INDEPENDENT_AMBULATORY_CARE_PROVIDER_SITE_OTHER): Payer: Medicare Other

## 2021-04-08 DIAGNOSIS — J309 Allergic rhinitis, unspecified: Secondary | ICD-10-CM | POA: Diagnosis not present

## 2021-04-11 DIAGNOSIS — Z1231 Encounter for screening mammogram for malignant neoplasm of breast: Secondary | ICD-10-CM | POA: Diagnosis not present

## 2021-04-11 LAB — HM MAMMOGRAPHY

## 2021-04-12 ENCOUNTER — Encounter: Payer: Self-pay | Admitting: *Deleted

## 2021-04-18 ENCOUNTER — Ambulatory Visit (INDEPENDENT_AMBULATORY_CARE_PROVIDER_SITE_OTHER): Payer: Medicare Other | Admitting: *Deleted

## 2021-04-18 ENCOUNTER — Other Ambulatory Visit: Payer: Self-pay

## 2021-04-18 DIAGNOSIS — I739 Peripheral vascular disease, unspecified: Secondary | ICD-10-CM | POA: Diagnosis not present

## 2021-04-18 DIAGNOSIS — E119 Type 2 diabetes mellitus without complications: Secondary | ICD-10-CM

## 2021-04-18 NOTE — Progress Notes (Signed)
Patient presents today to pick up diabetic shoes and insoles.  Patient was dispensed 1 pair of diabetic shoes and 3 pairs of foam casted diabetic insoles. Fit was satisfactory. Instructions for break-in and wear was reviewed and a copy was given to the patient.   Re-appointment for regularly scheduled diabetic foot care visits or if they should experience any trouble with the shoes or insoles.  

## 2021-04-19 DIAGNOSIS — J3089 Other allergic rhinitis: Secondary | ICD-10-CM | POA: Diagnosis not present

## 2021-04-19 NOTE — Progress Notes (Signed)
VIALS MADE. EXP 04-19-22 

## 2021-04-20 DIAGNOSIS — J3081 Allergic rhinitis due to animal (cat) (dog) hair and dander: Secondary | ICD-10-CM | POA: Diagnosis not present

## 2021-04-22 ENCOUNTER — Ambulatory Visit (INDEPENDENT_AMBULATORY_CARE_PROVIDER_SITE_OTHER): Payer: Medicare Other

## 2021-04-22 DIAGNOSIS — J309 Allergic rhinitis, unspecified: Secondary | ICD-10-CM

## 2021-04-27 ENCOUNTER — Other Ambulatory Visit: Payer: Self-pay | Admitting: Family Medicine

## 2021-04-27 NOTE — Telephone Encounter (Signed)
Ok to refill??  Last office visit 12/24/2020.   Last refill 11/02/2019.

## 2021-05-09 ENCOUNTER — Ambulatory Visit (INDEPENDENT_AMBULATORY_CARE_PROVIDER_SITE_OTHER): Payer: Medicare Other | Admitting: *Deleted

## 2021-05-09 DIAGNOSIS — J309 Allergic rhinitis, unspecified: Secondary | ICD-10-CM

## 2021-05-18 ENCOUNTER — Other Ambulatory Visit: Payer: Self-pay | Admitting: Family Medicine

## 2021-05-18 ENCOUNTER — Other Ambulatory Visit: Payer: Self-pay | Admitting: *Deleted

## 2021-05-18 MED ORDER — ALBUTEROL SULFATE HFA 108 (90 BASE) MCG/ACT IN AERS: 2.0000 | INHALATION_SPRAY | Freq: Four times a day (QID) | RESPIRATORY_TRACT | 0 refills | Status: DC | PRN

## 2021-05-19 ENCOUNTER — Encounter: Payer: Self-pay | Admitting: Sports Medicine

## 2021-05-19 ENCOUNTER — Ambulatory Visit (INDEPENDENT_AMBULATORY_CARE_PROVIDER_SITE_OTHER): Payer: Medicare Other | Admitting: Sports Medicine

## 2021-05-19 ENCOUNTER — Ambulatory Visit (INDEPENDENT_AMBULATORY_CARE_PROVIDER_SITE_OTHER): Payer: Medicare Other | Admitting: *Deleted

## 2021-05-19 ENCOUNTER — Other Ambulatory Visit: Payer: Self-pay

## 2021-05-19 DIAGNOSIS — B351 Tinea unguium: Secondary | ICD-10-CM

## 2021-05-19 DIAGNOSIS — Z9229 Personal history of other drug therapy: Secondary | ICD-10-CM

## 2021-05-19 DIAGNOSIS — I739 Peripheral vascular disease, unspecified: Secondary | ICD-10-CM

## 2021-05-19 DIAGNOSIS — M79674 Pain in right toe(s): Secondary | ICD-10-CM

## 2021-05-19 DIAGNOSIS — E119 Type 2 diabetes mellitus without complications: Secondary | ICD-10-CM

## 2021-05-19 DIAGNOSIS — J309 Allergic rhinitis, unspecified: Secondary | ICD-10-CM

## 2021-05-19 DIAGNOSIS — M79675 Pain in left toe(s): Secondary | ICD-10-CM | POA: Diagnosis not present

## 2021-05-19 NOTE — Progress Notes (Signed)
Subjective: Madeline Wood is a 75 y.o. female patient seen today in office with complaint of mildly painful thickened and elongated toenails; unable to trim.  Last A1c not recorded and does not check her blood sugars diet controlled with no new issues like before.  Reports that her diabetic shoes are very helpful and has less pain at her bunion and hammertoes.  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   Subjective tinnitus of left ear 01/17/2018   Atherosclerosis of native coronary artery of native heart without angina pectoris 07/23/2017   History of food allergy 01/05/2016   Angioedema 11/17/2015   Seasonal and perennial allergic rhinoconjunctivitis 05/04/2015   Insomnia 08/24/2014   Bilateral leg edema 04/17/2014   Diabetes mellitus without complication ([place])    Essential hypertension    Gout    Mixed hyperlipidemia    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 8 g 0   ALPRAZolam (XANAX) 1 MG tablet Take 1 tablet (1 mg total) by mouth 2 (two) times daily. 60 tablet 2   aspirin 81 MG tablet Take 81 mg by mouth daily.     atorvastatin (LIPITOR) 80 MG tablet TAKE 1 TABLET BY MOUTH EVERY DAY 90 tablet 2   cyclobenzaprine (FLEXERIL) 5 MG tablet TAKE 1 TABLET BY MOUTH THREE TIMES A DAY AS NEEDED FOR MUSCLE SPASMS 20 tablet 0   EPINEPHrine (EPIPEN 2-PAK) 0.3 mg/0.3 mL IJ SOAJ injection Use as directed for life threatening  allergic reactions 1 each 3   fexofenadine (ALLEGRA) 180 MG tablet Take 1 tablet (180 mg total) by mouth daily. 30 tablet 5   fish oil-omega-3 fatty acids 1000 MG capsule Take 1 g by mouth. Takes occasionally     fluticasone (FLONASE) 50 MCG/ACT nasal spray Place 1-2 sprays into both nostrils daily as needed for allergies or rhinitis. 16 g 5   losartan (COZAAR) 25 MG tablet TAKE 2 TABLETS (50MG ) BY MOUTH EVERY DAY 180 tablet 3   metoprolol tartrate (LOPRESSOR) 50 MG tablet TAKE 1 TABLET EVERY MORNING AND 1 AND 1/2 TABLET EVERY EVENING 225 tablet 1   MODERNA COVID-19 VACCINE 100 MCG/0.5ML injection      Multiple Vitamin (MULTIVITAMIN) tablet Take 1 tablet by mouth. occasionally     nitroGLYCERIN (NITROSTAT) 0.4 MG SL tablet PLACE 1 TABLET UNDER THE TONGUE EVERY 5 MINUTES AS NEEDED FOR CHEST PAIN. 75 tablet 1   pantoprazole (PROTONIX) 40 MG tablet TAKE 1 TABLET BY MOUTH EVERY DAY 90 tablet 1   triamterene-hydrochlorothiazide (MAXZIDE-25) 37.5-25 MG tablet TAKE 1 TABLET BY MOUTH EVERY DAY 90 tablet 2   Wheat Dextrin (BENEFIBER PO) Take by mouth. Daily     zolpidem (AMBIEN) 10 MG tablet TAKE 1 TABLET BY MOUTH EVERY DAY AT BEDTIME AS NEEDED FOR SLEEP 30 tablet 0   [DISCONTINUED] omeprazole (PRILOSEC OTC) 20 MG tablet Take 1 tablet (20  mg total) by mouth daily. 30 tablet 1   No current facility-administered medications on file prior to visit.    Allergies  Allergen Reactions   Ciprofloxacin    Citalopram Hydrobromide    Escitalopram Oxalate    Paroxetine    Polysporin [Bacitracin-Polymyxin B]     Objective: Physical Exam  General: Well developed, nourished, no acute distress, awake, alert and oriented x 3  Vascular: Dorsalis pedis artery 1/4 bilateral, Posterior tibial artery 1/4 bilateral, skin temperature warm to warm proximal to distal bilateral lower extremities, no varicosities, pedal hair present bilateral.  Neurological: Gross sensation present via light touch bilateral.    Dermatological: Skin is warm, dry, and supple bilateral, Nails 1-10 are tender, long, thick, and discolored with mild subungal debris, no acute ingrowing noted, no webspace macerations present bilateral, no open lesions present bilateral, no callus/corns/hyperkeratotic tissue present bilateral. No signs of infection bilateral.  Musculoskeletal: Asymptomatic pes planus and bunion boney deformities noted bilateral. Muscular strength within normal limits without painon range of motion. No pain with calf compression bilateral.  Assessment and Plan:  Problem List Items Addressed This Visit       Endocrine   Diabetes mellitus without complication ([place])   Other Visit Diagnoses     Pain due to onychomycosis of toenails of both feet    -  Primary   Relevant Medications   MODERNA COVID-19 VACCINE 100 MCG/0.5ML injection   PVD (peripheral vascular disease) ([place])       HX: long term anticoagulant use          -Examined patient.  -Re-Discussed treatment options for painful mycotic nails -Mechanically debrided and reduced mycotic nails with sterile nail nipper and dremel nail file without incident.  -Patient to return in 2.5  months for follow up evaluation or sooner if symptoms worsen.  [name], [name] Subjective: Madeline Wood is a 75 y.o. female patient seen today in office with complaint of mildly painful thickened and elongated toenails; unable to trim.  Reports that her toes are doing better after getting her nails trim denies any changes with medication or any pedal complaints at this time.  Last A1c seen with recorded at 5.9 in June 2020 and does not check her blood sugars currently monitors because consider now prediabetic.  Denies any other pedal complaints at this time.  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   Subjective tinnitus of left  ear 01/17/2018   Atherosclerosis of native coronary artery of native heart without angina pectoris 07/23/2017   History of food allergy 01/05/2016   Angioedema 11/17/2015   Seasonal and perennial allergic rhinoconjunctivitis 05/04/2015   Insomnia 08/24/2014   Bilateral leg edema 04/17/2014   Diabetes mellitus without complication ([place])    Essential hypertension    Gout    Mixed hyperlipidemia    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 8 g 0   ALPRAZolam (XANAX) 1 MG tablet Take 1 tablet (1 mg total) by mouth 2 (two) times daily. 60 tablet 2   aspirin 81 MG tablet Take 81 mg by mouth daily.     atorvastatin (LIPITOR) 80 MG tablet TAKE 1 TABLET BY MOUTH EVERY DAY 90 tablet 2   cyclobenzaprine (FLEXERIL) 5 MG tablet TAKE 1 TABLET BY MOUTH THREE TIMES A DAY AS NEEDED FOR MUSCLE SPASMS 20 tablet 0   EPINEPHrine (EPIPEN 2-PAK) 0.3 mg/0.3 mL IJ SOAJ injection Use as directed for life threatening allergic reactions 1 each 3   fexofenadine (ALLEGRA) 180 MG tablet Take 1 tablet (180 mg total) by mouth daily. 30 tablet 5   fish oil-omega-3 fatty acids 1000 MG capsule Take 1 g by mouth. Takes occasionally     fluticasone (FLONASE) 50 MCG/ACT nasal spray Place 1-2 sprays into both nostrils daily as needed for allergies or rhinitis. 16 g 5   losartan (COZAAR) 25 MG tablet TAKE 2 TABLETS (50MG ) BY MOUTH EVERY DAY 180 tablet 3   metoprolol tartrate (LOPRESSOR) 50 MG tablet TAKE 1 TABLET EVERY MORNING AND 1 AND 1/2 TABLET EVERY EVENING 225 tablet 1   MODERNA COVID-19  VACCINE 100 MCG/0.5ML injection      Multiple Vitamin (MULTIVITAMIN) tablet Take 1 tablet by mouth. occasionally     nitroGLYCERIN (NITROSTAT) 0.4 MG SL tablet PLACE 1 TABLET UNDER THE TONGUE EVERY 5 MINUTES AS NEEDED FOR CHEST PAIN. 75 tablet 1   pantoprazole (PROTONIX) 40 MG tablet TAKE 1 TABLET BY MOUTH EVERY DAY 90 tablet 1   triamterene-hydrochlorothiazide (MAXZIDE-25) 37.5-25 MG tablet TAKE 1 TABLET BY MOUTH EVERY DAY 90 tablet 2   Wheat Dextrin (BENEFIBER PO) Take by mouth. Daily     zolpidem (AMBIEN) 10 MG tablet TAKE 1 TABLET BY MOUTH EVERY DAY AT BEDTIME AS NEEDED FOR SLEEP 30 tablet 0   [DISCONTINUED] omeprazole (PRILOSEC OTC) 20 MG tablet Take 1 tablet (20 mg total) by mouth daily. 30 tablet 1   No current facility-administered medications on file prior to visit.    Allergies  Allergen Reactions   Ciprofloxacin    Citalopram Hydrobromide    Escitalopram Oxalate    Paroxetine    Polysporin [Bacitracin-Polymyxin B]     Objective: Physical Exam  General: Well developed, nourished, no acute distress, awake, alert and oriented x 3  Vascular: Dorsalis pedis artery 1/4 bilateral, Posterior tibial artery 1/4 bilateral, skin temperature warm to warm proximal to distal bilateral lower extremities, no varicosities, pedal hair present bilateral.  Neurological: Gross sensation present via light touch bilateral.   Dermatological: Skin is warm, dry, and supple bilateral, Nails 1-10 are tender, long, thick, and discolored with mild subungal debris, no acute ingrowing noted, no webspace macerations present bilateral, no open lesions present bilateral, no callus/corns/hyperkeratotic tissue present bilateral. No signs of infection bilateral.  Musculoskeletal: Asymptomatic pes planus and bunion boney deformities noted bilateral. Muscular strength within normal limits without painon range of motion. No pain with calf compression bilateral.  Assessment and Plan:  Problem List Items Addressed  This Visit       Endocrine   Diabetes mellitus without complication ([place])   Other Visit Diagnoses     Pain due to onychomycosis of toenails of both feet    -  Primary   Relevant Medications   MODERNA COVID-19 VACCINE 100 MCG/0.5ML injection   PVD (peripheral vascular disease) ([place])       HX: long term anticoagulant use          -  Examined patient.  -Re-Discussed treatment options for painful mycotic nails -Mechanically debrided and reduced mycotic nails with sterile nail nipper and dremel nail file without incident.  -Patient to return in 2.5  months for follow up evaluation or sooner if symptoms worsen.  [name], [name] Subjective: Madeline Wood is a 75 y.o. female patient seen today in office with complaint of mildly painful thickened and elongated toenails; unable to trim.  Reports that her toes are doing better after getting her nails trim denies any changes with medication or any pedal complaints at this time.  Last A1c seen with recorded at 5.9 in June 2020 and does not check her blood sugars currently monitors because consider now prediabetic.  Denies any other pedal complaints at this time.  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   Subjective tinnitus of left ear 01/17/2018   Atherosclerosis of native coronary artery of native heart without angina pectoris 07/23/2017   History of food allergy 01/05/2016   Angioedema 11/17/2015   Seasonal and perennial allergic rhinoconjunctivitis 05/04/2015   Insomnia 08/24/2014   Bilateral leg edema 04/17/2014   Diabetes mellitus without complication ([place])    Essential hypertension    Gout    Mixed hyperlipidemia    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 8 g 0   ALPRAZolam (XANAX) 1 MG tablet Take 1 tablet (1 mg total) by mouth 2 (two) times daily. 60 tablet 2   aspirin 81 MG tablet Take 81 mg by mouth daily.     atorvastatin (LIPITOR) 80 MG tablet TAKE 1 TABLET BY MOUTH EVERY DAY 90 tablet 2   cyclobenzaprine (FLEXERIL) 5 MG tablet TAKE 1 TABLET BY MOUTH THREE TIMES A DAY AS NEEDED FOR MUSCLE SPASMS 20 tablet 0   EPINEPHrine (EPIPEN 2-PAK) 0.3 mg/0.3 mL IJ SOAJ injection Use as directed for life threatening allergic reactions 1 each 3   fexofenadine (ALLEGRA) 180 MG tablet Take 1 tablet (180 mg total) by mouth daily. 30 tablet 5   fish oil-omega-3 fatty acids 1000 MG capsule Take 1 g by mouth. Takes occasionally     fluticasone (FLONASE) 50 MCG/ACT nasal spray Place 1-2 sprays into both nostrils daily as needed for allergies or rhinitis. 16 g 5   losartan (COZAAR) 25 MG tablet TAKE 2 TABLETS (50MG ) BY MOUTH EVERY DAY 180 tablet 3   metoprolol tartrate (LOPRESSOR) 50 MG tablet TAKE 1 TABLET EVERY MORNING AND 1 AND 1/2 TABLET EVERY EVENING 225 tablet 1   MODERNA COVID-19 VACCINE 100 MCG/0.5ML injection      Multiple Vitamin (MULTIVITAMIN) tablet Take 1 tablet by mouth. occasionally     nitroGLYCERIN (NITROSTAT) 0.4 MG SL tablet PLACE 1 TABLET UNDER THE TONGUE EVERY 5 MINUTES AS NEEDED FOR CHEST PAIN. 75 tablet 1   pantoprazole (PROTONIX) 40 MG tablet TAKE 1 TABLET BY MOUTH EVERY DAY 90  tablet 1   triamterene-hydrochlorothiazide (MAXZIDE-25) 37.5-25 MG tablet TAKE 1 TABLET BY MOUTH EVERY DAY 90 tablet 2   Wheat Dextrin (BENEFIBER PO) Take by mouth. Daily     zolpidem (AMBIEN) 10 MG tablet TAKE 1 TABLET BY MOUTH EVERY DAY AT BEDTIME AS NEEDED FOR  SLEEP 30 tablet 0   [DISCONTINUED] omeprazole (PRILOSEC OTC) 20 MG tablet Take 1 tablet (20 mg total) by mouth daily. 30 tablet 1   No current facility-administered medications on file prior to visit.    Allergies  Allergen Reactions   Ciprofloxacin    Citalopram Hydrobromide    Escitalopram Oxalate    Paroxetine    Polysporin [Bacitracin-Polymyxin B]     Objective: Physical Exam  General: Well developed, nourished, no acute distress, awake, alert and oriented x 3  Vascular: Dorsalis pedis artery 1/4 bilateral, Posterior tibial artery 1/4 bilateral, skin temperature warm to warm proximal to distal bilateral lower extremities, no varicosities, pedal hair present bilateral.  Neurological: Gross sensation present via light touch bilateral.   Dermatological: Skin is warm, dry, and supple bilateral, Nails 1-10 are tender, long, thick, and discolored with mild subungal debris, no acute ingrowing noted, no webspace macerations present bilateral, no open lesions present bilateral, no callus/corns/hyperkeratotic tissue present bilateral. No signs of infection bilateral.  Musculoskeletal: Asymptomatic pes planus and bunion boney deformities noted bilateral. Muscular strength within normal limits without painon range of motion. No pain with calf compression bilateral.  Assessment and Plan:  Problem List Items Addressed This Visit       Endocrine   Diabetes mellitus without complication ([place])   Other Visit Diagnoses     Pain due to onychomycosis of toenails of both feet    -  Primary   Relevant Medications   MODERNA COVID-19 VACCINE 100 MCG/0.5ML injection   PVD (peripheral vascular disease) ([place])       HX: long term anticoagulant use          -Examined patient.  -Re-Discussed treatment options for painful mycotic nails like previous -Mechanically debrided and reduced mycotic nails with sterile nail nipper and dremel nail file without incident.  -Continue with diabetic  shoes -Patient to return in 2.5 to 3 months for follow up evaluation or sooner if symptoms worsen.  [name], [name]

## 2021-05-23 ENCOUNTER — Other Ambulatory Visit: Payer: Self-pay | Admitting: Family Medicine

## 2021-05-23 NOTE — Telephone Encounter (Signed)
Ok to refill??  Last office visit 12/24/2020.  Last refill 12/09/2020, #2 refills.

## 2021-05-31 ENCOUNTER — Other Ambulatory Visit: Payer: Self-pay

## 2021-06-01 ENCOUNTER — Other Ambulatory Visit: Payer: Medicare Other

## 2021-06-01 ENCOUNTER — Ambulatory Visit (INDEPENDENT_AMBULATORY_CARE_PROVIDER_SITE_OTHER): Payer: Medicare Other | Admitting: *Deleted

## 2021-06-01 ENCOUNTER — Other Ambulatory Visit: Payer: Self-pay

## 2021-06-01 DIAGNOSIS — E119 Type 2 diabetes mellitus without complications: Secondary | ICD-10-CM

## 2021-06-01 DIAGNOSIS — I10 Essential (primary) hypertension: Secondary | ICD-10-CM | POA: Diagnosis not present

## 2021-06-01 DIAGNOSIS — E782 Mixed hyperlipidemia: Secondary | ICD-10-CM

## 2021-06-01 DIAGNOSIS — Z136 Encounter for screening for cardiovascular disorders: Secondary | ICD-10-CM | POA: Diagnosis not present

## 2021-06-01 DIAGNOSIS — Z13220 Encounter for screening for lipoid disorders: Secondary | ICD-10-CM

## 2021-06-01 DIAGNOSIS — J309 Allergic rhinitis, unspecified: Secondary | ICD-10-CM | POA: Diagnosis not present

## 2021-06-02 ENCOUNTER — Ambulatory Visit: Payer: Self-pay | Admitting: Family Medicine

## 2021-06-02 LAB — COMPLETE METABOLIC PANEL WITH GFR
AG Ratio: 1.4 (calc) (ref 1.0–2.5)
ALT: 13 U/L (ref 6–29)
AST: 27 U/L (ref 10–35)
Albumin: 4.2 g/dL (ref 3.6–5.1)
Alkaline phosphatase (APISO): 37 U/L (ref 37–153)
BUN: 10 mg/dL (ref 7–25)
CO2: 31 mmol/L (ref 20–32)
Calcium: 9.9 mg/dL (ref 8.6–10.4)
Chloride: 93 mmol/L — ABNORMAL LOW (ref 98–110)
Creat: 0.73 mg/dL (ref 0.60–1.00)
Globulin: 3 g/dL (calc) (ref 1.9–3.7)
Glucose, Bld: 92 mg/dL (ref 65–99)
Potassium: 4 mmol/L (ref 3.5–5.3)
Sodium: 132 mmol/L — ABNORMAL LOW (ref 135–146)
Total Bilirubin: 0.8 mg/dL (ref 0.2–1.2)
Total Protein: 7.2 g/dL (ref 6.1–8.1)
eGFR: 86 mL/min/{1.73_m2} (ref >=60)

## 2021-06-02 LAB — HEMOGLOBIN A1C
Hgb A1c MFr Bld: 5.8 % of total Hgb — ABNORMAL HIGH (ref <5.7)
Mean Plasma Glucose: 120 mg/dL
eAG (mmol/L): 6.6 mmol/L

## 2021-06-02 LAB — CBC WITH DIFFERENTIAL/PLATELET
Absolute Monocytes: 541 cells/uL (ref 200–950)
Basophils Absolute: 42 cells/uL (ref 0–200)
Basophils Relative: 0.8 %
Eosinophils Absolute: 133 cells/uL (ref 15–500)
Eosinophils Relative: 2.5 %
HCT: 38.2 % (ref 35.0–45.0)
Hemoglobin: 12.6 g/dL (ref 11.7–15.5)
Lymphs Abs: 2189 cells/uL (ref 850–3900)
MCH: 30.6 pg (ref 27.0–33.0)
MCHC: 33 g/dL (ref 32.0–36.0)
MCV: 92.7 fL (ref 80.0–100.0)
MPV: 8.9 fL (ref 7.5–12.5)
Monocytes Relative: 10.2 %
Neutro Abs: 2396 cells/uL (ref 1500–7800)
Neutrophils Relative %: 45.2 %
Platelets: 218 10*3/uL (ref 140–400)
RBC: 4.12 10*6/uL (ref 3.80–5.10)
RDW: 12.4 % (ref 11.0–15.0)
Total Lymphocyte: 41.3 %
WBC: 5.3 10*3/uL (ref 3.8–10.8)

## 2021-06-02 LAB — LIPID PANEL
Cholesterol: 164 mg/dL (ref <200)
HDL: 72 mg/dL (ref >=50)
LDL Cholesterol (Calc): 80 mg/dL (calc)
Non-HDL Cholesterol (Calc): 92 mg/dL (calc) (ref <130)
Total CHOL/HDL Ratio: 2.3 (calc) (ref <5.0)
Triglycerides: 50 mg/dL (ref <150)

## 2021-06-06 ENCOUNTER — Encounter: Payer: Self-pay | Admitting: Family Medicine

## 2021-06-06 ENCOUNTER — Ambulatory Visit (INDEPENDENT_AMBULATORY_CARE_PROVIDER_SITE_OTHER): Payer: Medicare Other | Admitting: Family Medicine

## 2021-06-06 ENCOUNTER — Other Ambulatory Visit: Payer: Self-pay

## 2021-06-06 VITALS — BP 144/72 | HR 70 | Temp 98.2°F | Resp 16 | Ht 66.0 in | Wt 179.0 lb

## 2021-06-06 DIAGNOSIS — I2510 Atherosclerotic heart disease of native coronary artery without angina pectoris: Secondary | ICD-10-CM | POA: Diagnosis not present

## 2021-06-06 DIAGNOSIS — I10 Essential (primary) hypertension: Secondary | ICD-10-CM

## 2021-06-06 DIAGNOSIS — R7303 Prediabetes: Secondary | ICD-10-CM

## 2021-06-06 DIAGNOSIS — E782 Mixed hyperlipidemia: Secondary | ICD-10-CM | POA: Diagnosis not present

## 2021-06-06 NOTE — Progress Notes (Signed)
Subjective:    Patient ID: Madeline Wood, female    DOB: 03-26-1946, 75 y.o.   MRN: [id number]     Patient is here today for a checkup.  She has a history of prediabetes as well as hypertension.  Her blood pressure here today is mildly elevated however she states that she is nervous.  Her blood pressure at home is typically in the 725-366 range systolic.  She denies any chest pain shortness of breath or dyspnea on exertion.  Her most recent lab work is listed below and shows mild hyponatremia.  However otherwise her liver and kidney test are excellent.  Her cholesterol is outstanding.  And her hemoglobin A1c is acceptable at 5.8.  She is due for a flu shot as well as a COVID booster. Lab on 06/01/2021  Component Date Value Ref Range Status   WBC 06/01/2021 5.3  3.8 - 10.8 Thousand/uL Final   RBC 06/01/2021 4.12  3.80 - 5.10 Million/uL Final   Hemoglobin 06/01/2021 12.6  11.7 - 15.5 g/dL Final   HCT 06/01/2021 38.2  35.0 - 45.0 % Final   MCV 06/01/2021 92.7  80.0 - 100.0 fL Final   MCH 06/01/2021 30.6  27.0 - 33.0 pg Final   MCHC 06/01/2021 33.0  32.0 - 36.0 g/dL Final   RDW 06/01/2021 12.4  11.0 - 15.0 % Final   Platelets 06/01/2021 218  140 - 400 Thousand/uL Final   MPV 06/01/2021 8.9  7.5 - 12.5 fL Final   Neutro Abs 06/01/2021 2,396  1,500 - 7,800 cells/uL Final   Lymphs Abs 06/01/2021 2,189  850 - 3,900 cells/uL Final   Absolute Monocytes 06/01/2021 541  200 - 950 cells/uL Final   Eosinophils Absolute 06/01/2021 133  15 - 500 cells/uL Final   Basophils Absolute 06/01/2021 42  0 - 200 cells/uL Final   Neutrophils Relative % 06/01/2021 45.2  % Final   Total Lymphocyte 06/01/2021 41.3  % Final   Monocytes Relative 06/01/2021 10.2  % Final   Eosinophils Relative 06/01/2021 2.5  % Final   Basophils Relative 06/01/2021 0.8  % Final   Glucose, Bld 06/01/2021 92  65 - 99 mg/dL Final   Comment: .            Fasting reference interval .    BUN 06/01/2021 10  7 - 25 mg/dL Final    Creat 06/01/2021 0.73  0.60 - 1.00 mg/dL Final   eGFR 06/01/2021 86  > OR = 60 mL/min/1.15m Final   Comment: The eGFR is based on the CKD-EPI 2021 equation. To calculate  the new eGFR from a previous Creatinine or Cystatin C result, go to https://www.kidney.org/professionals/ kdoqi/gfr%5Fcalculator    BUN/Creatinine Ratio 144/03/4742NOT APPLICABLE  6 - 22 (calc) Final   Sodium 06/01/2021 132 (A) 135 - 146 mmol/L Final   Potassium 06/01/2021 4.0  3.5 - 5.3 mmol/L Final   Chloride 06/01/2021 93 (A) 98 - 110 mmol/L Final   CO2 06/01/2021 31  20 - 32 mmol/L Final   Calcium 06/01/2021 9.9  8.6 - 10.4 mg/dL Final   Total Protein 06/01/2021 7.2  6.1 - 8.1 g/dL Final   Albumin 06/01/2021 4.2  3.6 - 5.1 g/dL Final   Globulin 06/01/2021 3.0  1.9 - 3.7 g/dL (calc) Final   AG Ratio 06/01/2021 1.4  1.0 - 2.5 (calc) Final   Total Bilirubin 06/01/2021 0.8  0.2 - 1.2 mg/dL Final   Alkaline phosphatase (APISO) 06/01/2021 37  37 - 153 U/L  Final   AST 06/01/2021 27  10 - 35 U/L Final   ALT 06/01/2021 13  6 - 29 U/L Final   Hgb A1c MFr Bld 06/01/2021 5.8 (A) <5.7 % of total Hgb Final   Comment: For someone without known diabetes, a hemoglobin  A1c value between 5.7% and 6.4% is consistent with prediabetes and should be confirmed with a  follow-up test. . For someone with known diabetes, a value <7% indicates that their diabetes is well controlled. A1c targets should be individualized based on duration of diabetes, age, comorbid conditions, and other considerations. . This assay result is consistent with an increased risk of diabetes. . Currently, no consensus exists regarding use of hemoglobin A1c for diagnosis of diabetes for children. .    Mean Plasma Glucose 06/01/2021 120  mg/dL Final   eAG (mmol/L) 06/01/2021 6.6  mmol/L Final   Cholesterol 06/01/2021 164  <200 mg/dL Final   HDL 06/01/2021 72  > OR = 50 mg/dL Final   Triglycerides 06/01/2021 50  <150 mg/dL Final   LDL Cholesterol  (Calc) 06/01/2021 80  mg/dL (calc) Final   Comment: Reference range: <100 . Desirable range <100 mg/dL for primary prevention;   <70 mg/dL for patients with CHD or diabetic patients  with > or = 2 CHD risk factors. [name] LDL-C is now calculated using the Martin-Hopkins  calculation, which is a validated novel method providing  better accuracy than the Friedewald equation in the  estimation of LDL-C.  Cresenciano Genre et al. Annamaria Helling. 0962;836(62): 2061-2068  (http://education.[facility].com/faq/FAQ164)    Total CHOL/HDL Ratio 06/01/2021 2.3  <5.0 (calc) Final   Non-HDL Cholesterol (Calc) 06/01/2021 92  <130 mg/dL (calc) Final   Comment: For patients with diabetes plus 1 major ASCVD risk  factor, treating to a non-HDL-C goal of <100 mg/dL  (LDL-C of <70 mg/dL) is considered a therapeutic  option.      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    Angio-edema    Anxiety    Arthritis    Blood transfusion without reported diagnosis    1985 with hysterectomy    CAD (coronary artery disease)    stent of distal RCA in 2006   Diabetes mellitus without complication ([place])    diet controlled-    Diverticulosis    GERD (gastroesophageal reflux disease)    Gout    H/O heart artery stent    Hiatal hernia    History of nuclear stress test 03/2011   negative lexiscan myoview; normal perfusion   Hyperlipidemia    Hypertension    Knee pain    Neuromuscular disorder ([place])    Hiatal Hernia    Obesity    Positive TB test    Venous insufficiency    Past Surgical History:  Procedure Laterality Date   ABDOMINAL HYSTERECTOMY     BACK SURGERY  2012   BREAST LUMPECTOMY     COLONOSCOPY  2011   CORONARY ANGIOPLASTY WITH STENT PLACEMENT  06/29/2005   Taxus 2.5x57m DES to distal RCA (Dr. MTami Ribas   TRANSTHORACIC ECHOCARDIOGRAM  12/20/2012   EF 594-76% normal systolic function, mild hypokinesis of inf  myocardium; calcified MV annulua, LA & RA mildly dilated   Current Outpatient Medications on File Prior to Visit  Medication Sig Dispense Refill   albuterol (VENTOLIN HFA) 108 (90 Base) MCG/ACT inhaler Inhale 2 puffs into the lungs every 6 (six) hours  as needed for wheezing or shortness of breath. 8 g 0   ALPRAZolam (XANAX) 1 MG tablet TAKE 1 TABLET BY MOUTH TWICE A DAY 60 tablet 2   aspirin 81 MG tablet Take 81 mg by mouth daily.     atorvastatin (LIPITOR) 80 MG tablet TAKE 1 TABLET BY MOUTH EVERY DAY 90 tablet 2   cyclobenzaprine (FLEXERIL) 5 MG tablet TAKE 1 TABLET BY MOUTH THREE TIMES A DAY AS NEEDED FOR MUSCLE SPASMS 20 tablet 0   EPINEPHrine (EPIPEN 2-PAK) 0.3 mg/0.3 mL IJ SOAJ injection Use as directed for life threatening allergic reactions 1 each 3   fexofenadine (ALLEGRA) 180 MG tablet Take 1 tablet (180 mg total) by mouth daily. 30 tablet 5   fish oil-omega-3 fatty acids 1000 MG capsule Take 1 g by mouth. Takes occasionally     fluticasone (FLONASE) 50 MCG/ACT nasal spray Place 1-2 sprays into both nostrils daily as needed for allergies or rhinitis. 16 g 5   losartan (COZAAR) 25 MG tablet TAKE 2 TABLETS (50MG) BY MOUTH EVERY DAY 180 tablet 3   metoprolol tartrate (LOPRESSOR) 50 MG tablet TAKE 1 TABLET EVERY MORNING AND 1 AND 1/2 TABLET EVERY EVENING 225 tablet 1   Multiple Vitamin (MULTIVITAMIN) tablet Take 1 tablet by mouth. occasionally     nitroGLYCERIN (NITROSTAT) 0.4 MG SL tablet PLACE 1 TABLET UNDER THE TONGUE EVERY 5 MINUTES AS NEEDED FOR CHEST PAIN. 75 tablet 1   pantoprazole (PROTONIX) 40 MG tablet TAKE 1 TABLET BY MOUTH EVERY DAY 90 tablet 1   triamterene-hydrochlorothiazide (MAXZIDE-25) 37.5-25 MG tablet TAKE 1 TABLET BY MOUTH EVERY DAY 90 tablet 2   Wheat Dextrin (BENEFIBER PO) Take by mouth. Daily     zolpidem (AMBIEN) 10 MG tablet TAKE 1 TABLET BY MOUTH EVERY DAY AT BEDTIME AS NEEDED FOR SLEEP 30 tablet 0   [DISCONTINUED] omeprazole (PRILOSEC OTC) 20 MG tablet Take 1  tablet (20 mg total) by mouth daily. 30 tablet 1   No current facility-administered medications on file prior to visit.   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  Tobacco Use   Smoking status: Former    Types: Cigarettes    Quit date: 12/20/2001    Years since quitting: 19.4   Smokeless tobacco: Never  Vaping Use   Vaping Use: Never used  Substance and Sexual Activity   Alcohol use: No    Alcohol/week: 0.0 standard drinks   Drug use: No   Sexual activity: Not on file  Other Topics Concern   Not on file  Social History Narrative   Not on file   Social Determinants of Health   Financial Resource Strain: Low Risk    Difficulty of Paying Living Expenses: Not very hard  Food Insecurity: Not on file  Transportation Needs: Not on file  Physical Activity: Not on file  Stress: Not on file  Social Connections: Not on file  Intimate Partner Violence: Not on file     Review of Systems  All other systems reviewed and are negative.     Objective:   Physical  Exam Constitutional:      General: She is not in acute distress.    Appearance: Normal appearance. She is not ill-appearing, toxic-appearing or diaphoretic.  Cardiovascular:     Rate and Rhythm: Normal rate and regular rhythm.     Pulses: Normal pulses.     Heart sounds: Normal heart sounds. No murmur heard.   No friction rub. No gallop.  Pulmonary:     Effort: Pulmonary effort is normal. No respiratory distress.     Breath sounds: Normal breath sounds. No stridor. No wheezing, rhonchi or rales.  Abdominal:     General: Bowel sounds are normal. There is no  distension.     Palpations: Abdomen is soft.     Tenderness: There is no abdominal tenderness. There is no rebound.  Musculoskeletal:     Right lower leg: No edema.     Left lower leg: No edema.  Neurological:     Mental Status: She is alert.          Assessment & Plan:  Essential hypertension  Atherosclerosis of native coronary artery of native heart without angina pectoris  Mixed hyperlipidemia  Prediabetes Patient declines a flu shot today as well as the COVID booster but she plans to get them soon.  Blood pressure is elevated however she thinks this is due to her nerves.  She plans to monitor this more closely at home.  Otherwise her cholesterol is outstanding.  Her A1c is acceptable.  She denies any angina or chest pain or shortness of breath.

## 2021-06-10 ENCOUNTER — Ambulatory Visit (INDEPENDENT_AMBULATORY_CARE_PROVIDER_SITE_OTHER): Payer: Medicare Other | Admitting: *Deleted

## 2021-06-10 ENCOUNTER — Other Ambulatory Visit: Payer: Self-pay | Admitting: Family Medicine

## 2021-06-10 DIAGNOSIS — J309 Allergic rhinitis, unspecified: Secondary | ICD-10-CM | POA: Diagnosis not present

## 2021-06-17 DIAGNOSIS — Z20822 Contact with and (suspected) exposure to covid-19: Secondary | ICD-10-CM | POA: Diagnosis not present

## 2021-06-20 ENCOUNTER — Telehealth: Payer: Self-pay | Admitting: Pharmacist

## 2021-06-20 NOTE — Progress Notes (Signed)
    Chronic Care Management Pharmacy Assistant   Name: Madeline Wood  MRN: [id number] DOB: 06-Aug-1946  Reason for Encounter: General Disease State Call   Conditions to be addressed/monitored: HTN, GERD, pre-Diabetes, HLD, Anxiety, insomnia.  Recent office visits:  06/06/21 Dr. Dennard Schaumann For follow-up. No medication changes.  Recent consult visits:  05/19/21 Podiatry Cannon Kettle, Lady Saucier, [name]. For nail problem. No medication changes.   Hospital visits:  None since 03/31/21  Medications: Outpatient Encounter Medications as of 06/20/2021  Medication Sig   albuterol (VENTOLIN HFA) 108 (90 Base) MCG/ACT inhaler TAKE 2 PUFFS INTO LUNGS EVERY 6 HOURS AS NEEDED FOR WHEEZE OR SHORTNESS OF BREATH   ALPRAZolam (XANAX) 1 MG tablet TAKE 1 TABLET BY MOUTH TWICE A DAY   aspirin 81 MG tablet Take 81 mg by mouth daily.   atorvastatin (LIPITOR) 80 MG tablet TAKE 1 TABLET BY MOUTH EVERY DAY   cyclobenzaprine (FLEXERIL) 5 MG tablet TAKE 1 TABLET BY MOUTH THREE TIMES A DAY AS NEEDED FOR MUSCLE SPASMS   EPINEPHrine (EPIPEN 2-PAK) 0.3 mg/0.3 mL IJ SOAJ injection Use as directed for life threatening allergic reactions   fexofenadine (ALLEGRA) 180 MG tablet Take 1 tablet (180 mg total) by mouth daily.   fish oil-omega-3 fatty acids 1000 MG capsule Take 1 g by mouth. Takes occasionally   fluticasone (FLONASE) 50 MCG/ACT nasal spray Place 1-2 sprays into both nostrils daily as needed for allergies or rhinitis.   losartan (COZAAR) 25 MG tablet TAKE 2 TABLETS (50MG ) BY MOUTH EVERY DAY   metoprolol tartrate (LOPRESSOR) 50 MG tablet TAKE 1 TABLET EVERY MORNING AND 1 AND 1/2 TABLET EVERY EVENING   Multiple Vitamin (MULTIVITAMIN) tablet Take 1 tablet by mouth. occasionally   nitroGLYCERIN (NITROSTAT) 0.4 MG SL tablet PLACE 1 TABLET UNDER THE TONGUE EVERY 5 MINUTES AS NEEDED FOR CHEST PAIN.   pantoprazole (PROTONIX) 40 MG tablet TAKE 1 TABLET BY MOUTH EVERY DAY   triamterene-hydrochlorothiazide (MAXZIDE-25) 37.5-25 MG  tablet TAKE 1 TABLET BY MOUTH EVERY DAY   Wheat Dextrin (BENEFIBER PO) Take by mouth. Daily   zolpidem (AMBIEN) 10 MG tablet TAKE 1 TABLET BY MOUTH EVERY DAY AT BEDTIME AS NEEDED FOR SLEEP   [DISCONTINUED] omeprazole (PRILOSEC OTC) 20 MG tablet Take 1 tablet (20 mg total) by mouth daily.   No facility-administered encounter medications on file as of 06/20/2021.   GEN CALL: Patient stated she doesn't have any questions about her medications at this time. We dicussed her follow-up appointment with Dr. Dennard Schaumann. She state she has a good appetite and eats a well rounded diet daily. She stated she makes sure she drinks plenty of water. She stated she is active doing things around the house daily. Reminded her of her appointment with the pharmacist next month.   Care Gaps:None.   Star Rating Drugs: Losartan 25 mg 04/08/21 90 DS, Atorvastatin 80 mg 05/21/21 90 DS.   Follow-Up:Pharmacist Review  Charlann Lange, [place] Pharmacist Assistant [phone]

## 2021-06-23 ENCOUNTER — Ambulatory Visit (INDEPENDENT_AMBULATORY_CARE_PROVIDER_SITE_OTHER): Payer: Medicare Other | Admitting: *Deleted

## 2021-06-23 DIAGNOSIS — J309 Allergic rhinitis, unspecified: Secondary | ICD-10-CM

## 2021-06-28 ENCOUNTER — Other Ambulatory Visit: Payer: Self-pay

## 2021-06-28 ENCOUNTER — Encounter: Payer: Self-pay | Admitting: Family Medicine

## 2021-06-28 ENCOUNTER — Ambulatory Visit (INDEPENDENT_AMBULATORY_CARE_PROVIDER_SITE_OTHER): Payer: Medicare Other | Admitting: Family Medicine

## 2021-06-28 ENCOUNTER — Other Ambulatory Visit: Payer: Self-pay | Admitting: Family Medicine

## 2021-06-28 VITALS — BP 154/88 | HR 72 | Temp 98.4°F | Resp 16 | Ht 66.0 in | Wt 178.0 lb

## 2021-06-28 DIAGNOSIS — M109 Gout, unspecified: Secondary | ICD-10-CM | POA: Diagnosis not present

## 2021-06-28 MED ORDER — COLCHICINE 0.6 MG PO TABS: 0.6000 mg | ORAL_TABLET | Freq: Every day | ORAL | 0 refills | Status: DC

## 2021-06-28 NOTE — Progress Notes (Signed)
Subjective:    Patient ID: Madeline Wood, female    DOB: [birth year], 75 y.o.   MRN: [id number] Patient has a history of gout.  1 week ago, she had severe redness and pain and swelling in her right first MTP joint.  It hurts so bad she did not even want to walk on it.  However gradually over the last week the pain has improved each day.  Today the joint is not red hot or swollen.  It still mildly tender to palpation.  She does have a mild bunion.  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    Angio-edema    Anxiety    Arthritis    Blood transfusion without reported diagnosis    1985 with hysterectomy    CAD (coronary artery disease)    stent of distal RCA in 2006   Diabetes mellitus without complication ([place])    diet controlled-    Diverticulosis    GERD (gastroesophageal reflux disease)    Gout    H/O heart artery stent    Hiatal hernia    History of nuclear stress test 03/2011   negative lexiscan myoview; normal perfusion   Hyperlipidemia    Hypertension    Knee pain    Neuromuscular disorder ([place])    Hiatal Hernia    Obesity    Positive TB test    Venous insufficiency    Past Surgical History:  Procedure Laterality Date   ABDOMINAL HYSTERECTOMY     BACK SURGERY  2012   BREAST LUMPECTOMY     COLONOSCOPY  2011   CORONARY ANGIOPLASTY WITH STENT PLACEMENT  06/29/2005   Taxus 2.5x77mm DES to distal RCA (Dr. Tami Ribas)   TRANSTHORACIC ECHOCARDIOGRAM  12/20/2012   EF 71-24%, normal systolic function, mild hypokinesis of inf myocardium; calcified MV annulua, LA & RA mildly dilated   Current Outpatient Medications on File Prior to Visit  Medication Sig Dispense Refill   albuterol (VENTOLIN HFA) 108 (90 Base) MCG/ACT inhaler TAKE 2 PUFFS INTO LUNGS EVERY 6 HOURS AS NEEDED FOR WHEEZE OR SHORTNESS OF BREATH 8.5 each 3   ALPRAZolam (XANAX)  1 MG tablet TAKE 1 TABLET BY MOUTH TWICE A DAY 60 tablet 2   aspirin 81 MG tablet Take 81 mg by mouth daily.     atorvastatin (LIPITOR) 80 MG tablet TAKE 1 TABLET BY MOUTH EVERY DAY 90 tablet 2   cyclobenzaprine (FLEXERIL) 5 MG tablet TAKE 1 TABLET BY MOUTH THREE TIMES A DAY AS NEEDED FOR MUSCLE SPASMS 20 tablet 0   EPINEPHrine (EPIPEN 2-PAK) 0.3 mg/0.3 mL IJ SOAJ injection Use as directed for life threatening allergic reactions 1 each 3   fexofenadine (ALLEGRA) 180 MG tablet Take 1 tablet (180 mg total) by mouth daily. 30 tablet 5   fish oil-omega-3 fatty acids 1000 MG capsule Take 1 g by mouth. Takes occasionally     fluticasone (FLONASE) 50 MCG/ACT nasal spray Place 1-2 sprays into both nostrils daily as needed for allergies or rhinitis. 16 g 5   losartan (COZAAR) 25 MG tablet TAKE 2 TABLETS (50MG ) BY MOUTH EVERY DAY 180 tablet 3   metoprolol tartrate (LOPRESSOR) 50 MG tablet TAKE 1 TABLET EVERY MORNING AND 1 AND 1/2 TABLET EVERY EVENING 225 tablet 1   Multiple Vitamin (MULTIVITAMIN) tablet Take 1 tablet by mouth. occasionally  nitroGLYCERIN (NITROSTAT) 0.4 MG SL tablet PLACE 1 TABLET UNDER THE TONGUE EVERY 5 MINUTES AS NEEDED FOR CHEST PAIN. 75 tablet 1   pantoprazole (PROTONIX) 40 MG tablet TAKE 1 TABLET BY MOUTH EVERY DAY 90 tablet 1   triamterene-hydrochlorothiazide (MAXZIDE-25) 37.5-25 MG tablet TAKE 1 TABLET BY MOUTH EVERY DAY 90 tablet 2   Wheat Dextrin (BENEFIBER PO) Take by mouth. Daily     zolpidem (AMBIEN) 10 MG tablet TAKE 1 TABLET BY MOUTH EVERY DAY AT BEDTIME AS NEEDED FOR SLEEP 30 tablet 0   [DISCONTINUED] omeprazole (PRILOSEC OTC) 20 MG tablet Take 1 tablet (20 mg total) by mouth daily. 30 tablet 1   No current facility-administered medications on file prior to visit.   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  Tobacco Use   Smoking status: Former    Types: Cigarettes    Quit date: 12/20/2001    Years since quitting: 19.5   Smokeless tobacco: Never  Vaping Use   Vaping Use: Never used  Substance and Sexual Activity   Alcohol use: No    Alcohol/week: 0.0 standard drinks   Drug use: No   Sexual activity: Not on file  Other Topics Concern   Not on file  Social History Narrative   Not on file   Social Determinants of Health   Financial Resource Strain: Low Risk    Difficulty of Paying Living Expenses: Not very hard  Food Insecurity: Not on file  Transportation Needs: Not on file  Physical Activity: Not on file  Stress: Not on file  Social Connections: Not on file  Intimate Partner Violence: Not on file     Review of Systems  All other systems reviewed and are negative.     Objective:   Physical Exam Constitutional:      General: She is not in acute distress.    Appearance: Normal appearance. She is not ill-appearing, toxic-appearing or diaphoretic.  Cardiovascular:     Rate and Rhythm: Normal rate and regular rhythm.     Pulses: Normal pulses.          Dorsalis pedis pulses are 2+ on the right side.       Posterior tibial pulses are 2+ on the right side.     Heart sounds: Normal heart sounds. No murmur heard.   No friction rub. No gallop.  Pulmonary:     Effort: Pulmonary effort is normal. No respiratory distress.     Breath sounds: Normal breath sounds. No stridor. No wheezing, rhonchi or rales.  Abdominal:     General: Bowel sounds are normal. There is no distension.     Palpations: Abdomen is soft.     Tenderness: There is no abdominal tenderness. There is no rebound.  Musculoskeletal:     Right lower leg: No edema.     Left lower leg: No edema.     Right foot: Bunion  present.  Feet:     Right foot:     Skin integrity: Skin integrity normal. No ulcer, blister, skin breakdown, erythema or warmth.  Neurological:  Mental Status: She is alert.          Exacerbation of gout I believe the patient had a gout attack.  She is almost back to normal.  So I have given her colchicine.  She can take 2 tablets immediately today and then 1 more in an hour if the pain persist.  I believe that will help "finish off" the current gout attack.  We discussed using allopurinol as a preventative however together we have decided just to wait and see if this starts happening on a frequent basis.  Instead she is going to eat a diet low in uric acid try and avoid foods such as shrimp pork and beef is much as possible

## 2021-07-04 NOTE — Progress Notes (Signed)
Chronic Care Management Pharmacy Note  07/08/2021 Name:  Madeline Wood MRN:  [id number] DOB:  09-14-1945  Subjective: Madeline Wood is an 75 y.o. year old female who is a primary patient of Pickard, Cammie Mcgee, MD.  The CCM team was consulted for assistance with disease management and care coordination needs.    Engaged with patient by telephone for follow up visit in response to provider referral for pharmacy case management and/or care coordination services.   Consent to Services:  The patient was given the following information about Chronic Care Management services today, agreed to services, and gave verbal consent: 1. CCM service includes personalized support from designated clinical staff supervised by the primary care provider, including individualized plan of care and coordination with other care providers 2. 24/7 contact phone numbers for assistance for urgent and routine care needs. 3. Service will only be billed when office clinical staff spend 20 minutes or more in a month to coordinate care. 4. Only one practitioner may furnish and bill the service in a calendar month. 5.The patient may stop CCM services at any time (effective at the end of the month) by phone call to the office staff. 6. The patient will be responsible for cost sharing (co-pay) of up to 20% of the service fee (after annual deductible is met). Patient agreed to services and consent obtained.  Patient Care Team: [name], MD as PCP - General (Family Medicine) Dennard Schaumann Cammie Mcgee, MD (Family Medicine) Edythe Clarity, [facility] as Pharmacist (Pharmacist)  Recent office visits: 12/24/20 Dennard Schaumann) - patient with excellent follow up exam.  No changes to medication noted.  Recent consult visits: 12/15/20 Cannon Kettle, podiatry) - diabetic shoe inserts appointment and ordered.  Hospital visits: None in previous 6 months  Objective:  Lab Results  Component Value Date   CREATININE 0.73 06/01/2021   BUN 10  06/01/2021   GFRNONAA 81 12/20/2020   GFRAA 93 12/20/2020   NA 132 (L) 06/01/2021   K 4.0 06/01/2021   CALCIUM 9.9 06/01/2021   CO2 31 06/01/2021   GLUCOSE 92 06/01/2021    Lab Results  Component Value Date/Time   HGBA1C 5.8 (H) 06/01/2021 08:53 AM   HGBA1C 5.9 (H) 12/20/2020 09:07 AM   HGBA1C 5.9 02/18/2019 12:00 AM   MICROALBUR 1.8 12/20/2020 09:07 AM   MICROALBUR 0.4 02/27/2018 12:15 PM    Last diabetic Eye exam:  Lab Results  Component Value Date/Time   HMDIABEYEEXA No Retinopathy 07/14/2020 09:41 AM    Last diabetic Foot exam: No results found for: HMDIABFOOTEX   Lab Results  Component Value Date   CHOL 164 06/01/2021   HDL 72 06/01/2021   LDLCALC 80 06/01/2021   TRIG 50 06/01/2021   CHOLHDL 2.3 06/01/2021    Hepatic Function Latest Ref Rng & Units 06/01/2021 12/20/2020 02/16/2020  Total Protein 6.1 - 8.1 g/dL 7.2 7.1 6.7  Albumin 3.6 - 5.1 g/dL - - -  AST 10 - 35 U/L _0 ALT 6 - 29 U/L _1 Alk Phosphatase 33 - 130 U/L - - -  Total Bilirubin 0.2 - 1.2 mg/dL 0.8 1.1 0.7    Lab Results  Component Value Date/Time   TSH 1.58 12/01/2015 10:48 AM   TSH 1.47 11/11/2015 09:50 AM    CBC Latest Ref Rng & Units 06/01/2021 12/20/2020 02/16/2020  WBC 3.8 - 10.8 Thousand/uL 5.3 3.9 4.9  Hemoglobin 11.7 - 15.5 g/dL 12.6 12.3 12.6  Hematocrit 35.0 - 45.0 %  38.2 37.3 38.6  Platelets 140 - 400 Thousand/uL 218 221 236    Lab Results  Component Value Date/Time   VD25OH 32 11/11/2015 09:50 AM    Clinical ASCVD: No  The 10-year ASCVD risk score (Arnett DK, et al., 2019) is: 33.2%   Values used to calculate the score:     Age: 14 years     Sex: Female     Is Non-Hispanic African American: Yes     Diabetic: Yes     Tobacco smoker: No     Systolic Blood Pressure: 212 mmHg     Is BP treated: Yes     HDL Cholesterol: 72 mg/dL     Total Cholesterol: 164 mg/dL    Depression screen [facility] 2/9 12/24/2020 10/15/2018 02/27/2018  Decreased Interest 0 0 0  Down,  Depressed, Hopeless 0 0 0  PHQ - 2 Score 0 0 0  Altered sleeping - - -  Tired, decreased energy - - -  Change in appetite - - -  Feeling bad or failure about yourself  - - -  Trouble concentrating - - -  Moving slowly or fidgety/restless - - -  Suicidal thoughts - - -  PHQ-9 Score - - -  Difficult doing work/chores - - -  Some recent data might be hidden      Social History   Tobacco Use  Smoking Status Former   Types: Cigarettes   Quit date: 12/20/2001   Years since quitting: 19.5  Smokeless Tobacco Never   BP Readings from Last 3 Encounters:  07/07/21 136/70  06/28/21 (!) 154/88  06/06/21 (!) 144/72   Pulse Readings from Last 3 Encounters:  06/28/21 72  06/06/21 70  12/24/20 76   Wt Readings from Last 3 Encounters:  06/28/21 178 lb (80.7 kg)  06/06/21 179 lb (81.2 kg)  12/24/20 191 lb (86.6 kg)   BMI Readings from Last 3 Encounters:  06/28/21 28.73 kg/m  06/06/21 28.89 kg/m  12/24/20 30.83 kg/m    Assessment/Interventions: Review of patient past medical history, allergies, medications, health status, including review of consultants reports, laboratory and other test data, was performed as part of comprehensive evaluation and provision of chronic care management services.   SDOH:  (Social Determinants of Health) assessments and interventions performed: Yes   Financial Resource Strain: Not on file    SDOH Screenings   Alcohol Screen: Low Risk    Last Alcohol Screening Score (AUDIT): 0  Depression (PHQ2-9): Low Risk    PHQ-2 Score: 0  Financial Resource Strain: Not on file  Food Insecurity: Not on file  Housing: Not on file  Physical Activity: Not on file  Social Connections: Not on file  Stress: Not on file  Tobacco Use: Medium Risk   Smoking Tobacco Use: Former   Smokeless Tobacco Use: Never   Passive Exposure: Not on file  Transportation Needs: Not on file    [place]  Allergies  Allergen Reactions   Ciprofloxacin    Citalopram  Hydrobromide    Escitalopram Oxalate    Paroxetine    Polysporin [Bacitracin-Polymyxin B]     Medications Reviewed Today     Reviewed by Edythe Clarity, [facility] (Pharmacist) on 07/08/21 at [id number]  Med List Status: <None>   Medication Order Taking? Sig Documenting Provider Last Dose Status Informant  albuterol (VENTOLIN HFA) 108 (90 Base) MCG/ACT inhaler [id number] Yes TAKE 2 PUFFS INTO LUNGS EVERY 6 HOURS AS NEEDED FOR WHEEZE OR SHORTNESS OF BREATH Jenna Luo  T, MD Taking Active   ALPRAZolam [name] 1 MG tablet [id number] Yes TAKE 1 TABLET BY MOUTH TWICE A DAY [name], MD Taking Active   aspirin 81 MG tablet [id number] Yes Take 81 mg by mouth daily. [provider] Taking Active Self  atorvastatin (LIPITOR) 80 MG tablet [id number] Yes TAKE 1 TABLET BY MOUTH EVERY DAY Hilty, Nadean Corwin, MD Taking Active   colchicine 0.6 MG tablet [id number]  Take 2 tablets (1.2 mg total) by mouth daily for 1 day, THEN 1 tablet (0.6 mg total) daily as needed for up to 7 days (2 TABS AT ONSET OF PAIN, 1 TAB QD PRN x7 DAYS ONLY). [name], MD  Expired 07/06/21 2359   cyclobenzaprine (FLEXERIL) 5 MG tablet [id number] Yes TAKE 1 TABLET BY MOUTH THREE TIMES A DAY AS NEEDED FOR MUSCLE SPASMS [name], MD Taking Active   EPINEPHrine (EPIPEN 2-PAK) 0.3 mg/0.3 mL IJ SOAJ injection [id number] Yes Use as directed for life threatening allergic reactions Garnet Sierras, DO Taking Active   fexofenadine (ALLEGRA) 180 MG tablet [id number] Yes Take 1 tablet (180 mg total) by mouth daily. Orlena Sheldon, PA-C Taking Active Self  fish oil-omega-3 fatty acids 1000 MG capsule [id number] Yes Take 1 g by mouth. Takes occasionally [provider] Taking Active Self  fluticasone (FLONASE) 50 MCG/ACT nasal spray [id number] Yes Place 1-2 sprays into both nostrils daily as needed for allergies or rhinitis. Garnet Sierras, DO Taking Active   losartan (COZAAR) 25 MG tablet [id number] Yes TAKE 2 TABLETS (50MG) BY MOUTH  EVERY DAY Hilty, Nadean Corwin, MD Taking Active   metoprolol tartrate (LOPRESSOR) 50 MG tablet [id number] Yes TAKE 1 TABLET EVERY MORNING AND 1 AND 1/2 TABLET EVERY EVENING Hilty, Nadean Corwin, MD Taking Active   Multiple Vitamin (MULTIVITAMIN) tablet [id number] Yes Take 1 tablet by mouth. occasionally [provider] Taking Active Self  nitroGLYCERIN (NITROSTAT) 0.4 MG SL tablet [id number] Yes PLACE 1 TABLET UNDER THE TONGUE EVERY 5 MINUTES AS NEEDED FOR CHEST PAIN. Pixie Casino, MD Taking Active     Discontinued 03/24/13 0840 (Reorder)   pantoprazole (PROTONIX) 40 MG tablet [id number] Yes TAKE 1 TABLET BY MOUTH EVERY DAY [name], MD Taking Active   triamterene-hydrochlorothiazide [facility]) 37.5-25 MG tablet [id number] Yes TAKE 1 TABLET BY MOUTH EVERY DAY [name], MD Taking Active   Wheat Dextrin (BENEFIBER PO) [id number] Yes Take by mouth. Daily [provider] Taking Active   zolpidem (AMBIEN) 10 MG tablet [id number] Yes TAKE 1 TABLET BY MOUTH EVERY DAY AT BEDTIME AS NEEDED FOR SLEEP [name], MD Taking Active             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   Subjective tinnitus of left ear 01/17/2018   Atherosclerosis of native coronary artery of native heart without angina pectoris 07/23/2017   History of food allergy 01/05/2016   Angioedema 11/17/2015   Seasonal and perennial allergic rhinoconjunctivitis 05/04/2015   Insomnia 08/24/2014   Bilateral leg edema 04/17/2014   Diabetes mellitus without complication ([place])    Essential hypertension    Gout    Mixed hyperlipidemia    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    Anxiety    Obesity    Knee pain    Positive TB test    Hiatal hernia    GERD (gastroesophageal  reflux disease)    Coronary atherosclerosis 06/06/2010   GERD 06/06/2010   History of colonic polyps  06/06/2010    Immunization History  Administered Date(s) Administered   Fluad Quad(high Dose 65+) 05/20/2019   H1N1 08/03/2008   Influenza Whole 05/28/2008, 07/07/2011   Influenza, High Dose Seasonal PF 06/04/2017, 05/29/2018, 07/14/2020   Influenza,inj,Quad PF,6+ Mos 05/28/2013, 07/02/2014, 06/01/2015, 05/24/2016   Influenza-Unspecified 06/09/2008, 06/03/2009, 05/19/2010   Pneumococcal Conjugate-13 08/18/2013   Pneumococcal Polysaccharide-23 02/25/2014   Td 05/13/2007, 01/25/2012   Tdap 01/25/2012   Unspecified SARS-COV-2 Vaccination 10/20/2019, 10/31/2019, 06/28/2020   Zoster, Live 07/02/2014    Conditions to be addressed/monitored:  HT N, GERD, Diabetes, HLD, Anxiety, insomnia.  Care Plan : General Pharmacy (Adult)  Updates made by Edythe Clarity, RPH since 07/08/2021 12:00 AM     Problem: HTN, HLD, DM   Priority: High  Onset Date: 12/29/2020     Long-Range Goal: Patient-Specific Goal   Start Date: 12/29/2020  Expected End Date: 07/02/2021  Recent Progress: On track  Priority: High  Note:   Current Barriers:  Unable to independently monitor therapeutic efficacy  Pharmacist Clinical Goal(s):  Patient will achieve adherence to monitoring guidelines and medication adherence to achieve therapeutic efficacy maintain control of glucose, BP, and lipids as evidenced by routine follow up  contact provider office for questions/concerns as evidenced notation of same in electronic health record through collaboration with PharmD and provider.   Interventions: 1:1 collaboration with [name], MD regarding development and update of comprehensive plan of care as evidenced by provider attestation and co-signature Inter-disciplinary care team collaboration (see longitudinal plan of care) Comprehensive medication review performed; medication list updated in electronic medical record  Hypertension (BP goal <140/90) -Controlled -Current treatment: Losartan 57m two tablets  daily Metoprolol tartrate 547mtake one tablet po qam and one-half qpm Triamterene/HCTZ 37.5-25mg -Medications previously tried: none noted   -Current home readings: patient is not checking currently -Current dietary habits: nothing specific, watching her fats and carbs -Current exercise habits: minimal, plans to increase exercise as her next lifestyle change -Denies hypotensive/hypertensive symptoms -Educated on BP goals and benefits of medications for prevention of heart attack, stroke and kidney damage; Exercise goal of 150 minutes per week; Importance of home blood pressure monitoring; -Counseled to monitor BP at home weekly, document, and provide log at future appointments -Counseled on diet and exercise extensively Recommended to continue current medication  Update 07/07/21 BP was 136/70 when I asked her to check it today. Verified compliance with medication. She is still working on increasing her exercise.  She feels good overall, denies any dizziness or HA at home. No changes to medication needed at this time. Have asked her to check BP once weekly and record so that she can keep an eye on it at home simply due to her few elevated ones in office.   Hyperlipidemia: (LDL goal < 70) -Controlled -Current treatment: Atorvastatin 8054maily -Medications previously tried: none noted  -LDL controlled at most recent physical -Educated on Cholesterol goals;  Benefits of statin for ASCVD risk reduction; Importance of limiting foods high in cholesterol; Exercise goal of 150 minutes per week; -Recommended to continue current medication  Update 07/07/21 Lipid panel in October was excellent, no changes needed at all here. Reinforced adherence, patient not experiencing adverse effects at this time. Continue routine screenings - continue current statin,  Diabetes (A1c goal <6.5%) -Controlled -Current medications: None -Medications previously tried: none noted  -Current home  glucose readings fasting glucose:  not checking post prandial glucose: not checking -Denies hypoglycemic/hyperglycemic symptoms -Current exercise: minimal currently, plans to implement PA plan as part of next lifestyle change -Educated on A1c and blood sugar goals; Complications of diabetes including kidney damage, retinal damage, and cardiovascular disease; Exercise goal of 150 minutes per week; Benefits of weight loss; -Counseled to check feet daily and get yearly eye exams -Recommended to continue current strategies, implement PA plan with goal of 150 minutes per week of exercise.  Insomnia (Goal: Sleep Hygeine) -Controlled -Current treatment  Zolpidem 25m -Medications previously tried: none noted -Still taking half a tablet prn, does not like to take this all the time -Reports sleep has been pretty stable lately  -Recommended to continue current medication   Patient Goals/Self-Care Activities Patient will:  - take medications as prescribed check blood pressure weekly, document, and provide at future appointments target a minimum of 150 minutes of moderate intensity exercise weekly  Follow Up Plan: The care management team will reach out to the patient again over the next 180 days.            Medication Assistance: None required.  Patient affirms current coverage meets needs.  Patient's preferred pharmacy is:  [id number] [place] [id number]: [phone]: [phone] pill box? No - prefers vials Pt endorses 100% compliance  We discussed: Benefits of medication synchronization, packaging and delivery as well as enhanced pharmacist oversight with [employer]. Patient decided to: Continue current medication management strategy  Care Plan and Follow Up Patient Decision:  Patient agrees to Care Plan and Follow-up.  Plan: The care management team will reach  out to the patient again over the next 180 days.  ChBeverly MilchPharmD Clinical Pharmacist [facility] [phone]  Current Barriers:  Unable to independently monitor therapeutic efficacy  Pharmacist Clinical Goal(s):  Patient will achieve adherence to monitoring guidelines and medication adherence to achieve therapeutic efficacy maintain control of glucose, BP, and lipids as evidenced by routine follow up  contact provider office for questions/concerns as evidenced notation of same in electronic health record through collaboration with PharmD and provider.   Interventions: 1:1 collaboration with PiSusy FrizzleMD regarding development and update of comprehensive plan of care as evidenced by provider attestation and co-signature Inter-disciplinary care team collaboration (see longitudinal plan of care) Comprehensive medication review performed; medication list updated in electronic medical record  Hypertension (BP goal <140/90) -Controlled -Current treatment: Losartan 2531mwo tablets daily Metoprolol tartrate 65m29mke one tablet po qam and one-half qpm Triamterene/HCTZ 37.5-25mg -Medications previously tried: none noted   -Current home readings: patient is not checking currently -Current dietary habits: nothing specific, watching her fats and carbs -Current exercise habits: minimal, plans to increase exercise as her next lifestyle change -Denies hypotensive/hypertensive symptoms -Educated on BP goals and benefits of medications for prevention of heart attack, stroke and kidney damage; Exercise goal of 150 minutes per week; Importance of home blood pressure monitoring; -Counseled to monitor BP at home weekly, document, and provide log at future appointments -Counseled on diet and exercise extensively Recommended to continue current medication  Update 07/07/21 BP was 136/70 when I asked her to check it today. Verified compliance with  medication. She is still working on increasing her exercise.  She feels good overall, denies any dizziness or HA at home. No changes to medication needed at this time. Have asked  her to check BP once weekly and record so that she can keep an eye on it at home simply due to her few elevated ones in office.   Hyperlipidemia: (LDL goal < 70) -Controlled -Current treatment: Atorvastatin 36m daily -Medications previously tried: none noted  -LDL controlled at most recent physical -Educated on Cholesterol goals;  Benefits of statin for ASCVD risk reduction; Importance of limiting foods high in cholesterol; Exercise goal of 150 minutes per week; -Recommended to continue current medication  Update 07/07/21 Lipid panel in October was excellent, no changes needed at all here. Reinforced adherence, patient not experiencing adverse effects at this time. Continue routine screenings - continue current statin,  Diabetes (A1c goal <6.5%) -Controlled -Current medications: None -Medications previously tried: none noted  -Current home glucose readings fasting glucose: not checking post prandial glucose: not checking -Denies hypoglycemic/hyperglycemic symptoms -Current exercise: minimal currently, plans to implement PA plan as part of next lifestyle change -Educated on A1c and blood sugar goals; Complications of diabetes including kidney damage, retinal damage, and cardiovascular disease; Exercise goal of 150 minutes per week; Benefits of weight loss; -Counseled to check feet daily and get yearly eye exams -Recommended to continue current strategies, implement PA plan with goal of 150 minutes per week of exercise.  Insomnia (Goal: Sleep Hygeine) -Controlled -Current treatment  Zolpidem 130m-Medications previously tried: none noted -Still taking half a tablet prn, does not like to take this all the time -Reports sleep has been pretty stable lately  -Recommended to continue current  medication   Patient Goals/Self-Care Activities Patient will:  - take medications as prescribed check blood pressure weekly, document, and provide at future appointments target a minimum of 150 minutes of moderate intensity exercise weekly  Follow Up Plan: The care management team will reach out to the patient again over the next 180 days.

## 2021-07-07 ENCOUNTER — Ambulatory Visit (INDEPENDENT_AMBULATORY_CARE_PROVIDER_SITE_OTHER): Payer: Medicare Other | Admitting: Pharmacist

## 2021-07-07 VITALS — BP 136/70

## 2021-07-07 DIAGNOSIS — E782 Mixed hyperlipidemia: Secondary | ICD-10-CM

## 2021-07-07 DIAGNOSIS — I10 Essential (primary) hypertension: Secondary | ICD-10-CM

## 2021-07-08 ENCOUNTER — Ambulatory Visit: Payer: Medicare Other | Admitting: Physician Assistant

## 2021-07-08 ENCOUNTER — Other Ambulatory Visit: Payer: Self-pay | Admitting: *Deleted

## 2021-07-08 MED ORDER — ZOLPIDEM TARTRATE 10 MG PO TABS: ORAL_TABLET | ORAL | 0 refills | Status: DC

## 2021-07-08 NOTE — Telephone Encounter (Signed)
Ok to refill??  Last office visit 06/28/2021.  Last refill 04/28/2021.

## 2021-07-08 NOTE — Patient Instructions (Addendum)
Visit Information   Goals Addressed             This Visit's Progress    Lifestyle Change-Hypertension   On track    Timeframe:  Long-Range Goal Priority:  High Start Date:      12/29/20                       Expected End Date:    07/01/21                   Follow Up Date 03/31/21   - agree to work together to make changes - learn about high blood pressure    Why is this important?   The changes that you are asked to make may be hard to do.  This is especially true when the changes are life-long.  Knowing why it is important to you is the first step.  Working on the change with your family or support person helps you not feel alone.  Reward yourself and family or support person when goals are met. This can be an activity you choose like bowling, hiking, biking, swimming or shooting hoops.     Notes: Implement physical activity plan with target 150 minutes per week!!       Patient Care Plan: General Pharmacy (Adult)     Problem Identified: HTN, HLD, DM   Priority: High  Onset Date: 12/29/2020     Long-Range Goal: Patient-Specific Goal   Start Date: 12/29/2020  Expected End Date: 07/02/2021  Recent Progress: On track  Priority: High  Note:   Current Barriers:  Unable to independently monitor therapeutic efficacy  Pharmacist Clinical Goal(s):  Patient will achieve adherence to monitoring guidelines and medication adherence to achieve therapeutic efficacy maintain control of glucose, BP, and lipids as evidenced by routine follow up  contact provider office for questions/concerns as evidenced notation of same in electronic health record through collaboration with PharmD and provider.   Interventions: 1:1 collaboration with [name], MD regarding development and update of comprehensive plan of care as evidenced by provider attestation and co-signature Inter-disciplinary care team collaboration (see longitudinal plan of care) Comprehensive medication review  performed; medication list updated in electronic medical record  Hypertension (BP goal <140/90) -Controlled -Current treatment: Losartan 64m two tablets daily Metoprolol tartrate 529mtake one tablet po qam and one-half qpm Triamterene/HCTZ 37.5-25mg -Medications previously tried: none noted   -Current home readings: patient is not checking currently -Current dietary habits: nothing specific, watching her fats and carbs -Current exercise habits: minimal, plans to increase exercise as her next lifestyle change -Denies hypotensive/hypertensive symptoms -Educated on BP goals and benefits of medications for prevention of heart attack, stroke and kidney damage; Exercise goal of 150 minutes per week; Importance of home blood pressure monitoring; -Counseled to monitor BP at home weekly, document, and provide log at future appointments -Counseled on diet and exercise extensively Recommended to continue current medication  Update 07/07/21 BP was 136/70 when I asked her to check it today. Verified compliance with medication. She is still working on increasing her exercise.  She feels good overall, denies any dizziness or HA at home. No changes to medication needed at this time. Have asked her to check BP once weekly and record so that she can keep an eye on it at home simply due to her few elevated ones in office.   Hyperlipidemia: (LDL goal < 70) -Controlled -Current treatment: Atorvastatin 8024maily -Medications previously tried: none  noted  -LDL controlled at most recent physical -Educated on Cholesterol goals;  Benefits of statin for ASCVD risk reduction; Importance of limiting foods high in cholesterol; Exercise goal of 150 minutes per week; -Recommended to continue current medication  Update 07/07/21 Lipid panel in October was excellent, no changes needed at all here. Reinforced adherence, patient not experiencing adverse effects at this time. Continue routine screenings -  continue current statin,  Diabetes (A1c goal <6.5%) -Controlled -Current medications: None -Medications previously tried: none noted  -Current home glucose readings fasting glucose: not checking post prandial glucose: not checking -Denies hypoglycemic/hyperglycemic symptoms -Current exercise: minimal currently, plans to implement PA plan as part of next lifestyle change -Educated on A1c and blood sugar goals; Complications of diabetes including kidney damage, retinal damage, and cardiovascular disease; Exercise goal of 150 minutes per week; Benefits of weight loss; -Counseled to check feet daily and get yearly eye exams -Recommended to continue current strategies, implement PA plan with goal of 150 minutes per week of exercise.  Insomnia (Goal: Sleep Hygeine) -Controlled -Current treatment  Zolpidem 26m -Medications previously tried: none noted -Still taking half a tablet prn, does not like to take this all the time -Reports sleep has been pretty stable lately  -Recommended to continue current medication   Patient Goals/Self-Care Activities Patient will:  - take medications as prescribed check blood pressure weekly, document, and provide at future appointments target a minimum of 150 minutes of moderate intensity exercise weekly  Follow Up Plan: The care management team will reach out to the patient again over the next 180 days.           Patient verbalizes understanding of instructions provided today and agrees to view in [employer]  Telephone follow up appointment with pharmacy team member scheduled for: 6 months  CEdythe Clarity RPearland

## 2021-07-08 NOTE — Telephone Encounter (Signed)
-----   Message from Edythe Clarity, [facility] sent at 07/08/2021  9:05 AM EST ----- Patient requesting a refill on Zolpidem sent to [facility]!  Thanks!

## 2021-07-14 ENCOUNTER — Ambulatory Visit (INDEPENDENT_AMBULATORY_CARE_PROVIDER_SITE_OTHER): Payer: Medicare Other | Admitting: *Deleted

## 2021-07-14 DIAGNOSIS — J309 Allergic rhinitis, unspecified: Secondary | ICD-10-CM | POA: Diagnosis not present

## 2021-07-20 ENCOUNTER — Other Ambulatory Visit: Payer: Self-pay

## 2021-07-20 ENCOUNTER — Encounter: Payer: Self-pay | Admitting: Internal Medicine

## 2021-07-20 ENCOUNTER — Ambulatory Visit (INDEPENDENT_AMBULATORY_CARE_PROVIDER_SITE_OTHER): Payer: Medicare Other | Admitting: Internal Medicine

## 2021-07-20 VITALS — BP 128/78 | HR 78 | Ht 66.0 in | Wt 178.0 lb

## 2021-07-20 DIAGNOSIS — I10 Essential (primary) hypertension: Secondary | ICD-10-CM | POA: Diagnosis not present

## 2021-07-20 DIAGNOSIS — E871 Hypo-osmolality and hyponatremia: Secondary | ICD-10-CM | POA: Diagnosis not present

## 2021-07-20 DIAGNOSIS — Z23 Encounter for immunization: Secondary | ICD-10-CM

## 2021-07-20 DIAGNOSIS — Z79899 Other long term (current) drug therapy: Secondary | ICD-10-CM

## 2021-07-20 DIAGNOSIS — I2510 Atherosclerotic heart disease of native coronary artery without angina pectoris: Secondary | ICD-10-CM | POA: Diagnosis not present

## 2021-07-20 DIAGNOSIS — E782 Mixed hyperlipidemia: Secondary | ICD-10-CM | POA: Diagnosis not present

## 2021-07-20 MED ORDER — HYDROCHLOROTHIAZIDE 12.5 MG PO CAPS: 12.5000 mg | ORAL_CAPSULE | Freq: Every day | ORAL | 3 refills | Status: DC

## 2021-07-20 NOTE — Progress Notes (Signed)
OFFICE NOTE  Chief Complaint:  Routine follow-up  Primary Care Physician: [name], MD  HPI:  Madeline Wood is a 75 year old female patient formerly followed by Dr. Rollene Fare, who works as a Photographer. She has a history of coronary disease and had a drug-eluting stent placed to the distal RCA in 2006. She had a negative Myoview in 2012. Just has a history of obesity, hypertension and dyslipidemia. In 2012 underwent back surgery by Dr. Lynann Bologna and has had a good recovery from that.  Recently she had some problems with low blood pressure and had a decrease in her losartan dose to 50 mg once daily which has helped. She has however been having some lower extremity swelling.  When she last saw Dr. Rollene Fare in April 2014, he recommended starting her Dyazide to one full tablet daily. She did not start that given her concern about the change in her blood pressure. It is noted however her blood pressure is slightly higher today 150/84. She is asymptomatic, denying any chest pain, shortness of breath, palpitations, presyncope or syncopal symptoms.  [name] returns today for followup.  She denies any chest pain or worsening shortness of breath. Recent laboratory work shows good control of her cholesterol.  She is currently on atorvastatin 80 mg daily and recently had that he had discontinued due to excellent cholesterol control. She is also on aspirin and Plavix as well as losartan and Lopressor.  06/01/2016  [name] returns today for follow-up. She's done very well over he past year. r worsening shortness of breath. Weight is stable and I've encouraged her to continue to work on lowering it. Cholesterol control is been excellent. She has been on long-standing aspirin and Plavix however her stent was in 2006. Is not clear that there is an ongoing indication for dual antiplatelet therapy.Blood pressure is well controlled. EKG shows normal sinus rhythm at 62.  07/23/2017  Mrs.  Speiser returns today for follow-up.  She denies any new symptoms such as chest pain or worsening shortness of breath.  She had repeat lipid testing and May which showed an LDL C of 75, down slightly from her most recent testing.  Her hemoglobin A1c also is improved at 5.9.  This is mostly with dietary changes, what weight has been fairly stable.  She is not on any diabetic medications.  Reports a decrease in exercise recently and is committed to restarting that.  07/03/2018  [name] is seen today for routine follow-up.  Overall she continues to be without complaints.  Blood pressure initially was elevated 156/90 have her recheck came down to 144/88.  She was late for the appointment today and rush to get to the office.  She denies any new chest pain or worsening shortness of breath.  The lipid panel in July 2019 which showed total cholesterol 169, HDL 66, triglycerides 47 and LDL 89.  This represents good control however is not a goal LDL less than 70.  07/11/2019  [name] returns today for follow-up.  Overall she is well.  Her weight is stable.  Recent labs showed HDL 73 LDL 87.  She had extensive work-up as part of an insurance physical with multiple labs that are all pretty good.  Her blood pressure today was 124/76.  EKG shows sinus rhythm.  07/13/2020  [name] is seen today in follow-up.  This is a routine visit.  She continues to do well and is now 15 years out from PCI.  She denies any angina or worsening shortness of breath.  She has had some recent weight loss.  Blood pressures well controlled today.  EKG shows normal sinus rhythm.  She had lab work in June which showed total cholesterol 150, HDL 62, triglycerides 53 and LDL 75.  07/20/2021  [name] returns today for follow-up.  Overall she says she is feeling well.  Blood pressure is well controlled today.  She is lost additional weight.  She is actually down about 15 pounds since I last saw her.  EKG shows a normal sinus  rhythm.  She denies any chest pain or worsening shortness of breath.  Recently lab work is shown a hyponatremia.  Sodium has been running around 130.  This has been fairly consistent for a while.  I noted she is on a combination thiazide diuretic.  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    Angio-edema    Anxiety    Arthritis    Blood transfusion without reported diagnosis    1985 with hysterectomy    CAD (coronary artery disease)    stent of distal RCA in 2006   Diabetes mellitus without complication ([place])    diet controlled-    Diverticulosis    GERD (gastroesophageal reflux disease)    Gout    H/O heart artery stent    Hiatal hernia    History of nuclear stress test 03/2011   negative lexiscan myoview; normal perfusion   Hyperlipidemia    Hypertension    Knee pain    Neuromuscular disorder ([place])    Hiatal Hernia    Obesity    Positive TB test    Venous insufficiency     Past Surgical History:  Procedure Laterality Date   ABDOMINAL HYSTERECTOMY     BACK SURGERY  2012   BREAST LUMPECTOMY     COLONOSCOPY  2011   CORONARY ANGIOPLASTY WITH STENT PLACEMENT  06/29/2005   [place] 2.5x76mm DES to distal RCA (Dr. Tami Ribas)   TRANSTHORACIC ECHOCARDIOGRAM  12/20/2012   EF 62-70%, normal systolic function, mild hypokinesis of inf myocardium; calcified MV annulua, LA & RA mildly dilated    FAMHx:  Family History  Problem Relation Age of Onset   Ovarian cancer Mother    Other Father        homicide   Diabetes Sister    Hypertension Sister    Pancreatic cancer Brother    Heart attack Brother        x2   Hypertension Brother    Esophageal cancer Brother    Prostate cancer Brother    Colon cancer Neg Hx    Colon polyps Neg Hx    Rectal cancer Neg Hx    Stomach cancer Neg Hx     SOCHx:   reports that she quit smoking about 19 years ago. Her smoking use included cigarettes.  She has never used smokeless tobacco. She reports that she does not drink alcohol and does not use drugs.  ALLERGIES:  Allergies  Allergen Reactions   Ciprofloxacin    Citalopram Hydrobromide    Escitalopram Oxalate    Paroxetine    Polysporin [Bacitracin-Polymyxin B]     ROS: Pertinent items noted in HPI and remainder of comprehensive ROS otherwise negative.  HOME MEDS: Current Outpatient Medications  Medication Sig Dispense Refill   albuterol (VENTOLIN HFA) 108 (90 Base) MCG/ACT inhaler TAKE 2  PUFFS INTO LUNGS EVERY 6 HOURS AS NEEDED FOR WHEEZE OR SHORTNESS OF BREATH 8.5 each 3   ALPRAZolam (XANAX) 1 MG tablet TAKE 1 TABLET BY MOUTH TWICE A DAY 60 tablet 2   aspirin 81 MG tablet Take 81 mg by mouth daily.     atorvastatin (LIPITOR) 80 MG tablet TAKE 1 TABLET BY MOUTH EVERY DAY 90 tablet 2   cyclobenzaprine (FLEXERIL) 5 MG tablet TAKE 1 TABLET BY MOUTH THREE TIMES A DAY AS NEEDED FOR MUSCLE SPASMS 20 tablet 0   EPINEPHrine (EPIPEN 2-PAK) 0.3 mg/0.3 mL IJ SOAJ injection Use as directed for life threatening allergic reactions 1 each 3   fexofenadine (ALLEGRA) 180 MG tablet Take 1 tablet (180 mg total) by mouth daily. 30 tablet 5   fish oil-omega-3 fatty acids 1000 MG capsule Take 1 g by mouth. Takes occasionally     fluticasone (FLONASE) 50 MCG/ACT nasal spray Place 1-2 sprays into both nostrils daily as needed for allergies or rhinitis. 16 g 5   losartan (COZAAR) 25 MG tablet TAKE 2 TABLETS (50MG ) BY MOUTH EVERY DAY 180 tablet 3   metoprolol tartrate (LOPRESSOR) 50 MG tablet TAKE 1 TABLET EVERY MORNING AND 1 AND 1/2 TABLET EVERY EVENING 225 tablet 1   Multiple Vitamin (MULTIVITAMIN) tablet Take 1 tablet by mouth. occasionally     nitroGLYCERIN (NITROSTAT) 0.4 MG SL tablet PLACE 1 TABLET UNDER THE TONGUE EVERY 5 MINUTES AS NEEDED FOR CHEST PAIN. 75 tablet 1   pantoprazole (PROTONIX) 40 MG tablet TAKE 1 TABLET BY MOUTH EVERY DAY 90 tablet 1   triamterene-hydrochlorothiazide (MAXZIDE-25)  37.5-25 MG tablet TAKE 1 TABLET BY MOUTH EVERY DAY 90 tablet 2   Wheat Dextrin (BENEFIBER PO) Take by mouth. Daily     zolpidem (AMBIEN) 10 MG tablet TAKE 1 TABLET BY MOUTH EVERY DAY AT BEDTIME AS NEEDED FOR SLEEP 30 tablet 0   No current facility-administered medications for this visit.    LABS/IMAGING: No results found for this or any previous visit (from the past 48 hour(s)). No results found.  VITALS: BP 128/78   Pulse 78   Ht 5\' 6"  (1.676 m)   Wt 178 lb (80.7 kg)   SpO2 97%   BMI 28.73 kg/m   EXAM: General appearance: alert and no distress Neck: no adenopathy, no carotid bruit, no JVD, supple, symmetrical, trachea midline and thyroid not enlarged, symmetric, no tenderness/mass/nodules Lungs: clear to auscultation bilaterally Heart: regular rate and rhythm, S1, S2 normal, no murmur, click, rub or gallop Abdomen: soft, non-tender; bowel sounds normal; no masses,  no organomegaly and obese Extremities: edema 1+ bilaterally and no venous stasis changes or varicose veins Pulses: 2+ and symmetric Skin: Skin color, texture, turgor normal. No rashes or lesions Neurologic: Grossly normal Psych: Pleasant mood, affect  EKG: Normal sinus rhythm at 69-personally reviewed  ASSESSMENT: Coronary disease status post PCI to the distal RCA in 2006 (DES) Obesity Hypertension Dyslipidemia Chronic hyponatremia  PLAN: 1.   Mrs. Randal seems to be doing well without any chest pain or worsening shortness of breath.  She has had additional weight loss which is good.  This was intentional.  Blood pressure is well controlled.  Her cholesterol is at target.  She has been noted to have some chronic hyponatremia and was encouraged by her PCP to drink more water.  I suspect though this is related to her thiazide diuretic.  She is on Maxide.  This is well-documented in older patients to cause hyponatremia.  I would  advise discontinuing it however she is concerned about swelling so we will go ahead and  try low-dose HCTZ 12.5 mg daily.  If the hyponatremia persists then we may have to stop it completely and could consider low-dose loop diuretic.  She is also interested in an influenza vaccine which we will provide today.  Plan repeat metabolic profile to reassess sodium in 1 month.  Follow-up annually or sooner as necessary.  Pixie Casino, MD, FACC, [place] Director of the [facility]  [facility] Attending Cardiologist  Direct Dial: [phone]  Fax: [phone]  Website:  [email or web address]  [place] 07/20/2021, 11:02 AM

## 2021-07-20 NOTE — Patient Instructions (Signed)
Medication Instructions:   STOP: MAXZIDE   START: HYDROCHLOROTHIAZIDE 12.5mg  (ONCE) DAILY   *If you need a refill on your cardiac medications before your next appointment, please call your pharmacy*  Lab Work: PLEASE RETURN TO [place] REPEAT BLOOD WORK IN ONE MONTH- NO APPOINTMENT NEEDED  If you have labs (blood work) drawn today and your tests are completely normal, you will receive your results only by: [place] (if you have MyChart) OR A paper copy in the mail If you have any lab test that is abnormal or we need to change your treatment, we will call you to review the results.  Follow-Up: At [facility], you and your health needs are our priority.  As part of our continuing mission to provide you with exceptional heart care, we have created designated Provider Care Teams.  These Care Teams include your primary Cardiologist (physician) and Advanced Practice Providers (APPs -  Physician Assistants and Nurse Practitioners) who all work together to provide you with the care you need, when you need it.  We recommend signing up for the patient portal called "MyChart".  Sign up information is provided on this After Visit Summary.  MyChart is used to connect with patients for Virtual Visits (Telemedicine).  Patients are able to view lab/test results, encounter notes, upcoming appointments, etc.  Non-urgent messages can be sent to your provider as well.   To learn more about what you can do with MyChart, go to [email or web address].    Your next appointment:   1 year(s)  The format for your next appointment:   In Person  Provider:   Dr. Debara Pickett

## 2021-07-26 ENCOUNTER — Ambulatory Visit (INDEPENDENT_AMBULATORY_CARE_PROVIDER_SITE_OTHER): Payer: Medicare Other | Admitting: *Deleted

## 2021-07-26 DIAGNOSIS — J309 Allergic rhinitis, unspecified: Secondary | ICD-10-CM | POA: Diagnosis not present

## 2021-07-27 DIAGNOSIS — E782 Mixed hyperlipidemia: Secondary | ICD-10-CM

## 2021-07-27 DIAGNOSIS — I10 Essential (primary) hypertension: Secondary | ICD-10-CM | POA: Diagnosis not present

## 2021-08-08 ENCOUNTER — Ambulatory Visit (INDEPENDENT_AMBULATORY_CARE_PROVIDER_SITE_OTHER): Payer: Medicare Other

## 2021-08-08 DIAGNOSIS — J309 Allergic rhinitis, unspecified: Secondary | ICD-10-CM

## 2021-08-17 ENCOUNTER — Other Ambulatory Visit: Payer: Self-pay | Admitting: Internal Medicine

## 2021-08-18 ENCOUNTER — Other Ambulatory Visit: Payer: Self-pay

## 2021-08-18 ENCOUNTER — Encounter: Payer: Self-pay | Admitting: Sports Medicine

## 2021-08-18 ENCOUNTER — Ambulatory Visit (INDEPENDENT_AMBULATORY_CARE_PROVIDER_SITE_OTHER): Payer: Medicare Other | Admitting: Sports Medicine

## 2021-08-18 DIAGNOSIS — I739 Peripheral vascular disease, unspecified: Secondary | ICD-10-CM

## 2021-08-18 DIAGNOSIS — E119 Type 2 diabetes mellitus without complications: Secondary | ICD-10-CM

## 2021-08-18 DIAGNOSIS — M79674 Pain in right toe(s): Secondary | ICD-10-CM

## 2021-08-18 DIAGNOSIS — M79675 Pain in left toe(s): Secondary | ICD-10-CM

## 2021-08-18 DIAGNOSIS — B351 Tinea unguium: Secondary | ICD-10-CM | POA: Diagnosis not present

## 2021-08-18 DIAGNOSIS — M2141 Flat foot [pes planus] (acquired), right foot: Secondary | ICD-10-CM

## 2021-08-18 DIAGNOSIS — M21619 Bunion of unspecified foot: Secondary | ICD-10-CM

## 2021-08-18 DIAGNOSIS — M2142 Flat foot [pes planus] (acquired), left foot: Secondary | ICD-10-CM

## 2021-08-18 NOTE — Progress Notes (Signed)
Subjective: Madeline Wood is a 75 y.o. female patient seen today in office with complaint of mildly painful thickened and elongated toenails; unable to trim.  Reports that she has a few questions about her toes and states that she did have a gout flareup about a month ago. 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   Subjective tinnitus of left ear 01/17/2018   Atherosclerosis of native coronary artery of native heart without angina pectoris 07/23/2017   History of food allergy 01/05/2016   Angioedema 11/17/2015   Seasonal and perennial allergic rhinoconjunctivitis 05/04/2015   Insomnia 08/24/2014   Bilateral leg edema 04/17/2014   Diabetes mellitus without complication [facility])    Essential hypertension    Gout    Mixed hyperlipidemia    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    Anxiety    Obesity    Knee pain    Positive TB test    Hiatal hernia    GERD (gastroesophageal reflux disease)    Coronary atherosclerosis 06/06/2010   GERD 06/06/2010   History of colonic polyps 06/06/2010    Current Outpatient Medications on File Prior to Visit  Medication Sig Dispense Refill   atorvastatin (LIPITOR) 80 MG tablet TAKE 1 TABLET BY MOUTH EVERY DAY 90 tablet 2   albuterol (VENTOLIN HFA) 108 (90 Base) MCG/ACT inhaler TAKE 2 PUFFS INTO LUNGS EVERY 6 HOURS AS NEEDED FOR WHEEZE OR SHORTNESS OF BREATH 8.5 each 3   ALPRAZolam (XANAX) 1 MG tablet TAKE 1 TABLET BY MOUTH TWICE A DAY 60 tablet 2   aspirin 81 MG tablet Take 81 mg by mouth daily.     cyclobenzaprine (FLEXERIL) 5 MG tablet TAKE 1 TABLET BY MOUTH THREE TIMES A DAY AS NEEDED FOR MUSCLE SPASMS 20 tablet 0   EPINEPHrine (EPIPEN 2-PAK) 0.3 mg/0.3 mL IJ SOAJ injection Use as directed for life threatening allergic reactions 1 each 3   fexofenadine (ALLEGRA) 180 MG tablet Take 1 tablet (180 mg total) by mouth daily. 30 tablet 5    fish oil-omega-3 fatty acids 1000 MG capsule Take 1 g by mouth. Takes occasionally     fluticasone (FLONASE) 50 MCG/ACT nasal spray Place 1-2 sprays into both nostrils daily as needed for allergies or rhinitis. 16 g 5   hydrochlorothiazide (MICROZIDE) 12.5 MG capsule Take 1 capsule (12.5 mg total) by mouth daily. 90 capsule 3   losartan (COZAAR) 25 MG tablet TAKE 2 TABLETS (50MG ) BY MOUTH EVERY DAY 180 tablet 3   metoprolol tartrate (LOPRESSOR) 50 MG tablet TAKE 1 TABLET EVERY MORNING AND 1 AND 1/2 TABLET EVERY EVENING 225 tablet 1   Multiple Vitamin (MULTIVITAMIN) tablet Take 1 tablet by mouth. occasionally     nitroGLYCERIN (NITROSTAT) 0.4 MG SL tablet PLACE 1 TABLET UNDER THE TONGUE EVERY 5 MINUTES AS NEEDED FOR CHEST PAIN. 75 tablet 1   pantoprazole (PROTONIX) 40 MG tablet TAKE 1 TABLET BY MOUTH EVERY DAY 90 tablet 1   Wheat Dextrin (BENEFIBER PO) Take by mouth. Daily     zolpidem (AMBIEN) 10 MG tablet TAKE 1 TABLET BY MOUTH EVERY DAY AT BEDTIME AS NEEDED FOR SLEEP 30 tablet 0   [DISCONTINUED] omeprazole (PRILOSEC OTC) 20 MG tablet Take 1 tablet (20 mg total) by mouth daily. 30 tablet 1   No current facility-administered medications on file prior to visit.    Allergies  Allergen Reactions   Ciprofloxacin  Citalopram Hydrobromide    Escitalopram Oxalate    Paroxetine    Polysporin [Bacitracin-Polymyxin B]     Objective: Physical Exam  General: Well developed, nourished, no acute distress, awake, alert and oriented x 3  Vascular: Dorsalis pedis artery 1/4 bilateral, Posterior tibial artery 1/4 bilateral, skin temperature warm to warm proximal to distal bilateral lower extremities, no varicosities, pedal hair present bilateral.  Neurological: Gross sensation present via light touch bilateral.   Dermatological: Skin is warm, dry, and supple bilateral, Nails 1-10 are tender, long, thick, and discolored with mild subungal debris, no acute ingrowing noted, no webspace macerations  present bilateral, no open lesions present bilateral, no callus/corns/hyperkeratotic tissue present bilateral. No signs of infection bilateral.  Musculoskeletal: Asymptomatic pes planus and bunion boney deformities noted bilateral. Muscular strength within normal limits without painon range of motion. No pain with calf compression bilateral.  Assessment and Plan:  Problem List Items Addressed This Visit       Endocrine   Diabetes mellitus without complication ([place])   Other Visit Diagnoses     Pain due to onychomycosis of toenails of both feet    -  Primary   PVD (peripheral vascular disease) ([place])       Pes planus of both feet       Bunion          -Examined patient.  -Re-Discussed treatment options for painful mycotic nails like previous -Mechanically debrided and reduced mycotic nails with sterile nail nipper and dremel nail file without incident except a small nick on the baby toe which was treated with Lumicain.  -Continue with diabetic shoes and dispensed bunion shields -Patient to return in 2.5 to 3 months for follow up evaluation or sooner if symptoms worsen.  [name], [name]

## 2021-08-19 ENCOUNTER — Other Ambulatory Visit: Payer: Self-pay | Admitting: Allergy

## 2021-08-24 ENCOUNTER — Other Ambulatory Visit: Payer: Self-pay

## 2021-08-24 DIAGNOSIS — Z79899 Other long term (current) drug therapy: Secondary | ICD-10-CM | POA: Diagnosis not present

## 2021-08-25 ENCOUNTER — Other Ambulatory Visit: Payer: Self-pay | Admitting: Family Medicine

## 2021-08-25 LAB — BASIC METABOLIC PANEL
BUN/Creatinine Ratio: 19 (ref 12–28)
BUN: 15 mg/dL (ref 8–27)
CO2: 26 mmol/L (ref 20–29)
Calcium: 9.9 mg/dL (ref 8.7–10.3)
Chloride: 95 mmol/L — ABNORMAL LOW (ref 96–106)
Creatinine, Ser: 0.78 mg/dL (ref 0.57–1.00)
Glucose: 96 mg/dL (ref 70–99)
Potassium: 4.1 mmol/L (ref 3.5–5.2)
Sodium: 134 mmol/L (ref 134–144)
eGFR: 79 mL/min/{1.73_m2} (ref >59)

## 2021-08-25 NOTE — Telephone Encounter (Signed)
Ambien refill request.  Last seen 06/28/2021, last filled 07/08/2021

## 2021-08-30 ENCOUNTER — Encounter: Payer: Self-pay | Admitting: Internal Medicine

## 2021-08-31 ENCOUNTER — Ambulatory Visit (INDEPENDENT_AMBULATORY_CARE_PROVIDER_SITE_OTHER): Payer: Medicare Other

## 2021-08-31 DIAGNOSIS — J309 Allergic rhinitis, unspecified: Secondary | ICD-10-CM

## 2021-09-04 ENCOUNTER — Other Ambulatory Visit: Payer: Self-pay | Admitting: Internal Medicine

## 2021-09-04 DIAGNOSIS — I2510 Atherosclerotic heart disease of native coronary artery without angina pectoris: Secondary | ICD-10-CM

## 2021-09-06 ENCOUNTER — Other Ambulatory Visit: Payer: Self-pay

## 2021-09-06 DIAGNOSIS — K219 Gastro-esophageal reflux disease without esophagitis: Secondary | ICD-10-CM

## 2021-09-06 MED ORDER — PANTOPRAZOLE SODIUM 40 MG PO TBEC: 40.0000 mg | DELAYED_RELEASE_TABLET | Freq: Every day | ORAL | 1 refills | Status: DC

## 2021-09-15 ENCOUNTER — Other Ambulatory Visit: Payer: Self-pay | Admitting: Internal Medicine

## 2021-09-15 DIAGNOSIS — I2510 Atherosclerotic heart disease of native coronary artery without angina pectoris: Secondary | ICD-10-CM

## 2021-09-22 ENCOUNTER — Ambulatory Visit (INDEPENDENT_AMBULATORY_CARE_PROVIDER_SITE_OTHER): Payer: Medicare Other

## 2021-09-22 DIAGNOSIS — J309 Allergic rhinitis, unspecified: Secondary | ICD-10-CM | POA: Diagnosis not present

## 2021-09-23 ENCOUNTER — Other Ambulatory Visit: Payer: Self-pay | Admitting: Family Medicine

## 2021-09-23 ENCOUNTER — Other Ambulatory Visit: Payer: Self-pay | Admitting: Internal Medicine

## 2021-09-23 ENCOUNTER — Other Ambulatory Visit: Payer: Self-pay | Admitting: Allergy

## 2021-09-30 ENCOUNTER — Telehealth: Payer: Self-pay | Admitting: Pharmacist

## 2021-09-30 NOTE — Progress Notes (Signed)
Chronic Care Management Pharmacy Assistant   Name: Madeline Wood  MRN: [id number] DOB: 08/29/1945   Reason for Encounter: Disease State - Hypertension Call     Recent office visits:  None noted  Recent consult visits:  08/18/21 [name], [name] - Podiatry - Pain due to Onychomycosis of toenails both feet - Mechanically debrided and reduced mycotic nails with sterile nail nipper and dremel nail file without incident except a small nick on the baby toe which was treated with Lumicain.  -Continue with diabetic shoes and dispensed bunion shields. Patient to return in 2.5 to 3 months   07/20/21  Lyman Bishop, MD - Cardiology - CAD - EKG performed. Labs were ordered. Stop Maxzide Start Hydrochlorothiazide (MICROZIDE) 12.5 MG capsule prescribed. Flu vaccine administered. Follow up in 1 month.    Hospital visits:  None in previous 6 months  Medications: Outpatient Encounter Medications as of 09/30/2021  Medication Sig   atorvastatin (LIPITOR) 80 MG tablet TAKE 1 TABLET BY MOUTH EVERY DAY   albuterol (VENTOLIN HFA) 108 (90 Base) MCG/ACT inhaler TAKE 2 PUFFS INTO LUNGS EVERY 6 HOURS AS NEEDED FOR WHEEZE OR SHORTNESS OF BREATH   ALPRAZolam (XANAX) 1 MG tablet TAKE 1 TABLET BY MOUTH TWICE A DAY   aspirin 81 MG tablet Take 81 mg by mouth daily.   cyclobenzaprine (FLEXERIL) 5 MG tablet TAKE 1 TABLET BY MOUTH THREE TIMES A DAY AS NEEDED FOR MUSCLE SPASMS   EPINEPHrine (EPIPEN 2-PAK) 0.3 mg/0.3 mL IJ SOAJ injection Use as directed for life threatening allergic reactions   fexofenadine (ALLEGRA) 180 MG tablet Take 1 tablet (180 mg total) by mouth daily.   fish oil-omega-3 fatty acids 1000 MG capsule Take 1 g by mouth. Takes occasionally   fluticasone (FLONASE) 50 MCG/ACT nasal spray PLACE 1-2 SPRAYS INTO BOTH NOSTRILS DAILY AS NEEDED FOR ALLERGIES OR RHINITIS.   hydrochlorothiazide (MICROZIDE) 12.5 MG capsule Take 1 capsule (12.5 mg total) by mouth daily.   losartan (COZAAR) 25 MG tablet  TAKE 2 TABLETS (50MG ) BY MOUTH EVERY DAY   metoprolol tartrate (LOPRESSOR) 50 MG tablet TAKE 1 TABLET BY MOUTH EVERY MORNING AND 1 AND 1/2 TABLET EVERY EVENING   Multiple Vitamin (MULTIVITAMIN) tablet Take 1 tablet by mouth. occasionally   nitroGLYCERIN (NITROSTAT) 0.4 MG SL tablet PLACE 1 TABLET UNDER THE TONGUE EVERY 5 MINUTES AS NEEDED FOR CHEST PAIN.   pantoprazole (PROTONIX) 40 MG tablet Take 1 tablet (40 mg total) by mouth daily.   Wheat Dextrin (BENEFIBER PO) Take by mouth. Daily   zolpidem (AMBIEN) 10 MG tablet TAKE 1 TABLET BY MOUTH AT BEDTIME AS NEEDED FOR SLEEP   [DISCONTINUED] omeprazole (PRILOSEC OTC) 20 MG tablet Take 1 tablet (20 mg total) by mouth daily.   No facility-administered encounter medications on file as of 09/30/2021.    Current antihypertensive regimen:  Losartan 25mg  two tablets daily Metoprolol tartrate 50mg  take one tablet po qam and 1.5 qpm HCTZ 12.5mg  1 tablet daily   How often are you checking your Blood Pressure?  Patient reported checking blood pressures once a week or so.   Current home BP readings: Patient did not have any specific readings to report.   What recent interventions/DTPs have been made by any provider to improve Blood Pressure control since last CPP Visit:  Patient reported there has been a change to her regimen and she was placed on HCTZ at her last Cardiology visit.   Any recent hospitalizations or ED visits since last visit with  CPP?  Patient has not had any ED visits or hospitalizations since last visit with CPP.   What diet changes have been made to improve Blood Pressure Control?  Patient reported she has recently HCTZ and she is drinking plenty of water and doing well.    What exercise is being done to improve your Blood Pressure Control?  Patient reported she is active and tries to walk as much as possible.     Adherence Review: Is the patient currently on ACE/ARB medication? Yes Does the patient have >5 day gap between  last estimated fill dates? No   Care Gaps  AWV: unknown Colonoscopy: done 06/28/20 DM Eye Exam: due 07/14/21 DM Foot Exam: due 12/24/21 Microalbumin: done 12/20/20 HbgAIC: done 06/01/21 (5.8) DEXA: done 06/18/20 Mammogram: done 04/11/21   Star Rating Drugs:  Losartan (COZAAR) 25 MG tablet - last filled 07/03/21 90 days  Atorvastatin (LIPITOR) 80 MG tablet - last filled 08/17/21 90 days   Future Appointments  Date Time Provider [place]  11/17/2021 11:15 AM [name], [name] [facility]  12/29/2021 11:15 AM [facility]-NURSE HEALTH ADVISOR [facility] None  01/19/2022  2:15 PM [facility] PHARMACIST [facility] None    Jobe Gibbon, Onset Clinical Pharmacist Assistant  [phone]

## 2021-10-17 ENCOUNTER — Ambulatory Visit (INDEPENDENT_AMBULATORY_CARE_PROVIDER_SITE_OTHER): Payer: Medicare Other

## 2021-10-17 DIAGNOSIS — J309 Allergic rhinitis, unspecified: Secondary | ICD-10-CM | POA: Diagnosis not present

## 2021-10-18 NOTE — Progress Notes (Signed)
VIALS EXP 10-18-22

## 2021-10-19 ENCOUNTER — Ambulatory Visit: Payer: Medicare Other | Admitting: Internal Medicine

## 2021-10-19 DIAGNOSIS — J3089 Other allergic rhinitis: Secondary | ICD-10-CM | POA: Diagnosis not present

## 2021-10-19 DIAGNOSIS — R7303 Prediabetes: Secondary | ICD-10-CM | POA: Insufficient documentation

## 2021-10-20 ENCOUNTER — Other Ambulatory Visit: Payer: Self-pay | Admitting: Family Medicine

## 2021-10-20 DIAGNOSIS — J3089 Other allergic rhinitis: Secondary | ICD-10-CM | POA: Diagnosis not present

## 2021-10-20 NOTE — Telephone Encounter (Signed)
LOV 06/28/21 Last refill 05/24/21, #60, 2 refills  Please review, thanks!

## 2021-11-10 ENCOUNTER — Ambulatory Visit (INDEPENDENT_AMBULATORY_CARE_PROVIDER_SITE_OTHER): Payer: 59

## 2021-11-10 DIAGNOSIS — J309 Allergic rhinitis, unspecified: Secondary | ICD-10-CM | POA: Diagnosis not present

## 2021-11-17 ENCOUNTER — Encounter: Payer: Self-pay | Admitting: Sports Medicine

## 2021-11-17 ENCOUNTER — Other Ambulatory Visit: Payer: Self-pay

## 2021-11-17 ENCOUNTER — Ambulatory Visit (INDEPENDENT_AMBULATORY_CARE_PROVIDER_SITE_OTHER): Payer: 59 | Admitting: Sports Medicine

## 2021-11-17 DIAGNOSIS — M79675 Pain in left toe(s): Secondary | ICD-10-CM

## 2021-11-17 DIAGNOSIS — B351 Tinea unguium: Secondary | ICD-10-CM

## 2021-11-17 DIAGNOSIS — M21619 Bunion of unspecified foot: Secondary | ICD-10-CM

## 2021-11-17 DIAGNOSIS — M2141 Flat foot [pes planus] (acquired), right foot: Secondary | ICD-10-CM

## 2021-11-17 DIAGNOSIS — I739 Peripheral vascular disease, unspecified: Secondary | ICD-10-CM

## 2021-11-17 DIAGNOSIS — M2142 Flat foot [pes planus] (acquired), left foot: Secondary | ICD-10-CM

## 2021-11-17 DIAGNOSIS — E119 Type 2 diabetes mellitus without complications: Secondary | ICD-10-CM

## 2021-11-17 DIAGNOSIS — Z9229 Personal history of other drug therapy: Secondary | ICD-10-CM

## 2021-11-17 DIAGNOSIS — M79674 Pain in right toe(s): Secondary | ICD-10-CM

## 2021-11-17 NOTE — Progress Notes (Signed)
Subjective: ?Madeline Wood is a 76 y.o. female patient seen today in office with complaint of mildly painful thickened and elongated toenails; unable to trim.  No other pedal complaints noted.  ? ?A1c 5.8  ? ?Patient Active Problem List  ? Diagnosis Date Noted  ? Allergic reaction 09/29/2019  ? Allergic rhinitis 09/13/2018  ? Bilateral high frequency sensorineural hearing loss 01/17/2018  ? Subjective tinnitus of left ear 01/17/2018  ? Atherosclerosis of native coronary artery of native heart without angina pectoris 07/23/2017  ? History of food allergy 01/05/2016  ? Angioedema 11/17/2015  ? Seasonal and perennial allergic rhinoconjunctivitis 05/04/2015  ? Insomnia 08/24/2014  ? Bilateral leg edema 04/17/2014  ? Diabetes mellitus without complication ([place])   ? Essential hypertension   ? Gout   ? Mixed hyperlipidemia   ? 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 Anxiety   ? Obesity   ? Knee pain   ? Positive TB test   ? Hiatal hernia   ? GERD (gastroesophageal reflux disease)   ? Coronary atherosclerosis 06/06/2010  ? GERD 06/06/2010  ? History of colonic polyps 06/06/2010  ? ? ?Current Outpatient Medications on File Prior to Visit  ?Medication Sig Dispense Refill  ? atorvastatin (LIPITOR) 80 MG tablet TAKE 1 TABLET BY MOUTH EVERY DAY 90 tablet 2  ? albuterol (VENTOLIN HFA) 108 (90 Base) MCG/ACT inhaler TAKE 2 PUFFS INTO LUNGS EVERY 6 HOURS AS NEEDED FOR WHEEZE OR SHORTNESS OF BREATH 8.5 each 3  ? ALPRAZolam (XANAX) 1 MG tablet TAKE 1 TABLET BY MOUTH TWICE A DAY 60 tablet 2  ? aspirin 81 MG tablet Take 81 mg by mouth daily.    ? cyclobenzaprine (FLEXERIL) 5 MG tablet TAKE 1 TABLET BY MOUTH THREE TIMES A DAY AS NEEDED FOR MUSCLE SPASMS 20 tablet 0  ? EPINEPHrine (EPIPEN 2-PAK) 0.3 mg/0.3 mL IJ SOAJ injection Use as directed for life threatening allergic reactions 1 each 3  ? fexofenadine (ALLEGRA) 180 MG tablet Take 1 tablet (180 mg total) by mouth daily. 30 tablet 5  ? fish oil-omega-3 fatty acids 1000 MG capsule Take 1 g by mouth.  Takes occasionally    ? fluticasone (FLONASE) 50 MCG/ACT nasal spray PLACE 1-2 SPRAYS INTO BOTH NOSTRILS DAILY AS NEEDED FOR ALLERGIES OR RHINITIS. 48 mL 0  ? hydrochlorothiazide (MICROZIDE) 12.5 MG capsule Take 1 capsule (12.5 mg total) by mouth daily. 90 capsule 3  ? losartan (COZAAR) 25 MG tablet TAKE 2 TABLETS (50MG) BY MOUTH EVERY DAY 180 tablet 3  ? metoprolol tartrate (LOPRESSOR) 50 MG tablet TAKE 1 TABLET BY MOUTH EVERY MORNING AND 1 AND 1/2 TABLET EVERY EVENING 225 tablet 1  ? Multiple Vitamin (MULTIVITAMIN) tablet Take 1 tablet by mouth. occasionally    ? nitroGLYCERIN (NITROSTAT) 0.4 MG SL tablet PLACE 1 TABLET UNDER THE TONGUE EVERY 5 MINUTES AS NEEDED FOR CHEST PAIN. 25 tablet 2  ? pantoprazole (PROTONIX) 40 MG tablet Take 1 tablet (40 mg total) by mouth daily. 90 tablet 1  ? Wheat Dextrin (BENEFIBER PO) Take by mouth. Daily    ? zolpidem (AMBIEN) 10 MG tablet TAKE 1 TABLET BY MOUTH AT BEDTIME AS NEEDED FOR SLEEP 30 tablet 3  ? [DISCONTINUED] omeprazole (PRILOSEC OTC) 20 MG tablet Take 1 tablet (20 mg total) by mouth daily. 30 tablet 1  ? ?No current facility-administered medications on file prior to visit.  ? ? ?Allergies  ?Allergen Reactions  ? Ciprofloxacin   ? Citalopram Hydrobromide   ? Escitalopram Oxalate   ?  Paroxetine   ? Polysporin [Bacitracin-Polymyxin B]   ? ? ?Objective: ?Physical Exam ? ?General: Well developed, nourished, no acute distress, awake, alert and oriented x 3 ? ?Vascular: Dorsalis pedis artery 1/4 bilateral, Posterior tibial artery 1/4 bilateral, skin temperature warm to warm proximal to distal bilateral lower extremities, no varicosities, pedal hair present bilateral. ? ?Neurological: Gross sensation present via light touch bilateral.  ? ?Dermatological: Skin is warm, dry, and supple bilateral, Nails 1-10 are tender, long, thick, and discolored with mild subungal debris, no acute ingrowing noted, no webspace macerations present bilateral, no open lesions present bilateral, no  callus/corns/hyperkeratotic tissue present bilateral. No signs of infection bilateral. ? ?Musculoskeletal: Asymptomatic pes planus and bunion boney deformities noted bilateral. Muscular strength within normal limits without painon range of motion. No pain with calf compression bilateral. ? ?Assessment and Plan:  ?Problem List Items Addressed This Visit   ? ?  ? Endocrine  ? Diabetes mellitus without complication ([place])  ? ?Other Visit Diagnoses   ? ? Pain due to onychomycosis of toenails of both feet    -  Primary  ? PVD (peripheral vascular disease) ([place])      ? Pes planus of both feet      ? Bunion      ? HX: long term anticoagulant use      ? ?  ? ?-Examined patient.  ?-Re-Discussed treatment options for painful mycotic nails like previous ?-Mechanically debrided and reduced mycotic nails with sterile nail nipper and dremel nail file without incident ?-Continue with diabetic shoes; patient to met with Aaron Edelman for new shoes for the year ?-Patient to return in 3 months for follow up evaluation or sooner if symptoms worsen. ? ?[name], [name] ? ?

## 2021-11-22 ENCOUNTER — Ambulatory Visit: Payer: 59

## 2021-11-22 ENCOUNTER — Other Ambulatory Visit: Payer: Self-pay

## 2021-11-22 DIAGNOSIS — E119 Type 2 diabetes mellitus without complications: Secondary | ICD-10-CM

## 2021-11-22 DIAGNOSIS — M21619 Bunion of unspecified foot: Secondary | ICD-10-CM

## 2021-11-22 DIAGNOSIS — M2142 Flat foot [pes planus] (acquired), left foot: Secondary | ICD-10-CM

## 2021-11-22 NOTE — Progress Notes (Signed)
SITUATION ?Reason for Consult: Evaluation for Prefabricated Diabetic Shoes and Custom Diabetic Inserts. ?Patient / Caregiver Report: Patient would like well fitting shoes ? ?OBJECTIVE DATA: ?Patient History / Diagnosis:  ?  ICD-10-CM   ?1. Diabetes mellitus without complication (HCC)  K56.2   ?  ?2. Pes planus of both feet  M21.41   ? M21.42   ?  ?3. Bunion  M21.619   ?  ? ?Physician Treating Diabetes:  Cammie Mcgee. Dennard Schaumann, MD ? ?Current or Previous Devices:   Historical ? ?In-Person Foot Examination: ?Ulcers & Callousing:   None ?Deformities:    Pes planus, bunion ?Sensation:    Compromised  ?Shoe Size:     10.5XW ? ?ORTHOTIC RECOMMENDATION ?Recommended Devices: ?- 1x pair prefabricated PDAC approved diabetic shoes; Patient Selected [employer] [place] Size 10.5XW ?- 3x pair custom-to-patient PDAC approved vacuum formed diabetic insoles. ? ?GOALS OF SHOES AND INSOLES ?- Reduce shear and pressure ?- Reduce / Prevent callus formation ?- Reduce / Prevent ulceration ?- Protect the fragile healing compromised diabetic foot. ? ?Patient would benefit from diabetic shoes and inserts as patient has diabetes mellitus and the patient has one or more of the following conditions: ?- History of partial or complete amputation of the foot ?- History of previous foot ulceration. ?- History of pre-ulcerative callus ?- Peripheral neuropathy with evidence of callus formation ?- Foot deformity ?- Poor circulation ? ?ACTIONS PERFORMED ?Potential out of pocket cost was communicated to patient. Patient understood and consented to measurement and casting. Patient was casted for insoles via crush box and measured for shoes via brannock device. Procedure was explained and patient tolerated procedure well. All questions were answered and concerns addressed. Casts were shipped to central fabrication for HOLD until Certificate of Medical Necessity or otherwise necessary authorization from insurance is obtained. ? ?PLAN ?Shoes are to be  ordered and casts released from hold once all appropriate paperwork is complete. Patient is to be contacted and scheduled for fitting once shoes and insoles have been fabricated and received. ? ?

## 2021-11-29 ENCOUNTER — Ambulatory Visit (INDEPENDENT_AMBULATORY_CARE_PROVIDER_SITE_OTHER): Payer: 59

## 2021-11-29 DIAGNOSIS — J309 Allergic rhinitis, unspecified: Secondary | ICD-10-CM

## 2021-12-22 NOTE — Patient Instructions (Addendum)
Allergic rhinitis ?Continue allergen avoidance measures directed toward pollens, dust mite, mold, cat, and cockroach as listed below ?Continue allergen immunotherapy and have access to an epinephrine autoinjector set.  Return next week for your allergy injection if you are feeling well ?Continue an antihistamine once a day as needed for runny nose or itch.Remember to rotate to a different antihistamine about every 3 months. Some examples of over the counter antihistamines include Zyrtec (cetirizine), Xyzal (levocetirizine), Allegra (fexofenadine), and Claritin (loratidine).  ?Continue Flonase 1 to 2 sprays in each nostril once a day as needed for a stuffy nose ?Consider saline nasal rinses as needed for nasal symptoms. Use this before any medicated nasal sprays for best result ? ?Allergic conjunctivitis ?Some over the counter eye drops include Pataday one drop in each eye once a day as needed for red, itchy eyes OR Zaditor one drop in each eye twice a day as needed for red itchy eyes. ? ?Your blood pressure was elevated at today's visit.  Contact your primary care provider immediately for management of your blood pressure ? ?Call the clinic if this treatment plan is not working well for you ? ?Follow up in 1 year or sooner if needed. ? ?Reducing Pollen Exposure ?The American Academy of Allergy, Asthma and Immunology suggests the following steps to reduce your exposure to pollen during allergy seasons. ?Do not hang sheets or clothing out to dry; pollen may collect on these items. ?Do not mow lawns or spend time around freshly cut grass; mowing stirs up pollen. ?Keep windows closed at night.  Keep car windows closed while driving. ?Minimize morning activities outdoors, a time when pollen counts are usually at their highest. ?Stay indoors as much as possible when pollen counts or humidity is high and on windy days when pollen tends to remain in the air longer. ?Use air conditioning when possible.  Many air conditioners  have filters that trap the pollen spores. ?Use a HEPA room air filter to remove pollen form the indoor air you breathe. ? ?Control of Mold Allergen ?Mold and fungi can grow on a variety of surfaces provided certain temperature and moisture conditions exist.  Outdoor molds grow on plants, decaying vegetation and soil.  The major outdoor mold, Alternaria and Cladosporium, are found in very high numbers during hot and dry conditions.  Generally, a late Summer - Fall peak is seen for common outdoor fungal spores.  Rain will temporarily lower outdoor mold spore count, but counts rise rapidly when the rainy period ends.  The most important indoor molds are Aspergillus and Penicillium.  Dark, humid and poorly ventilated basements are ideal sites for mold growth.  The next most common sites of mold growth are the bathroom and the kitchen. ? ?Outdoor [employer] ?Use air conditioning and keep windows closed ?Avoid exposure to decaying vegetation. ?Avoid leaf raking. ?Avoid grain handling. ?Consider wearing a face mask if working in moldy areas. ? ?Indoor Mold Control ?Maintain humidity below 50%. ?Clean washable surfaces with 5% bleach solution. ?Remove sources e.g. Contaminated carpets. ? ?Control of Dog or Cat Allergen ?Avoidance is the best way to manage a dog or cat allergy. If you have a dog or cat and are allergic to dog or cats, consider removing the dog or cat from the home. ?If you have a dog or cat but don?t want to find it a new home, or if your family wants a pet even though someone in the household is allergic, here are some strategies that may help  keep symptoms at bay: ? ?Keep the pet out of your bedroom and restrict it to only a few rooms. Be advised that keeping the dog or cat in only one room will not limit the allergens to that room. ?Don?t pet, hug or kiss the dog or cat; if you do, wash your hands with soap and water. ?High-efficiency particulate air (HEPA) cleaners run continuously in a bedroom or  living room can reduce allergen levels over time. ?Regular use of a high-efficiency vacuum cleaner or a central vacuum can reduce allergen levels. ?Giving your dog or cat a bath at least once a week can reduce airborne allergen. ? ? ?Control of Dust Mite Allergen ?Dust mites play a major role in allergic asthma and rhinitis. They occur in environments with high humidity wherever human skin is found. Dust mites absorb humidity from the atmosphere (ie, they do not drink) and feed on organic matter (including shed human and animal skin). Dust mites are a microscopic type of insect that you cannot see with the naked eye. High levels of dust mites have been detected from mattresses, pillows, carpets, upholstered furniture, bed covers, clothes, soft toys and any woven material. The principal allergen of the dust mite is found in its feces. A gram of dust may contain 1,000 mites and 250,000 fecal particles. Mite antigen is easily measured in the air during house cleaning activities. Dust mites do not bite and do not cause harm to humans, other than by triggering allergies/asthma. ? ?Ways to decrease your exposure to dust mites in your home: ? ?1. Encase mattresses, box springs and pillows with a mite-impermeable barrier or cover ? ?2. Wash sheets, blankets and drapes weekly in hot water (130? F) with detergent and dry them in a dryer on the hot setting. ? ?3. Have the room cleaned frequently with a vacuum cleaner and a damp dust-mop. For carpeting or rugs, vacuuming with a vacuum cleaner equipped with a high-efficiency particulate air (HEPA) filter. The dust mite allergic individual should not be in a room which is being cleaned and should wait 1 hour after cleaning before going into the room. ? ?4. Do not sleep on upholstered furniture (eg, couches). ? ?5. If possible removing carpeting, upholstered furniture and drapery from the home is ideal. Horizontal blinds should be eliminated in the rooms where the person spends  the most time (bedroom, study, television room). Washable vinyl, roller-type shades are optimal. ? ?6. Remove all non-washable stuffed toys from the bedroom. Wash stuffed toys weekly like sheets and blankets above. ? ?7. Reduce indoor humidity to less than 50%. Inexpensive humidity monitors can be purchased at most hardware stores. Do not use a humidifier as can make the problem worse and are not recommended. ? ?Control of Cockroach Allergen ?Cockroach allergen has been identified as an important cause of acute attacks of asthma, especially in urban settings.  There are fifty-five species of cockroach that exist in the [place], however only three, the [place], [place] species produce allergen that can affect patients with Asthma.  Allergens can be obtained from fecal particles, egg casings and secretions from cockroaches. ?   ?Remove food sources. ?Reduce access to water. ?Seal access and entry points. ?Spray runways with 0.5-1% Diazinon or Chlorpyrifos ?Blow boric acid power under stoves and refrigerator. ?Place bait stations (hydramethylnon) at feeding sites. ? ? ?

## 2021-12-22 NOTE — Progress Notes (Signed)
? ?[place] [id number] ?Dept: [phone] ? ?FOLLOW UP NOTE ? ?Patient ID: Madeline Wood, female    DOB: [birth year]  Age: 76 y.o. MRN: [id number] ?Date of Office Visit: 12/23/2021 ? ?Assessment  ?Chief Complaint: Allergic Rhinitis  (Yearly - Pretty good) ? ?HPI ?Madeline Wood is a 76 year old female who presents to the clinic for follow-up visit.  She was last seen in this clinic on 11/12/2020 for evaluation of allergic rhinitis on allergen immunotherapy and reaction to octinoxate and oxybenzone.  At today's visit, she reports her allergies have been moderately well controlled with occasional postnasal drip as the main symptom.  She continues Allegra 180 mg once a day, Flonase occasionally, and saline nasal rinses occasionally.  She continues allergen immunotherapy with no large or local reactions.  Reports a significant decrease in her symptoms of allergic rhinitis while continuing on allergen immunotherapy.  She reports occasional red and itchy eyes for which she uses an over-the-counter allergy eyedrop with relief of symptoms.  She does report some relatively new emotional stressors in her life and reports that she is not sleeping well lately.  Her blood pressure was elevated at the beginning and the end of her visit with our clinic.  She was strongly advised to go to her primary care provider for management of her blood pressure as soon as possible.  Her allergy injection was held for this week and she may get this injection if she is feeling better and her blood pressure is under better control next week.  She regularly takes Lopressor 50 mg once in the morning and an additional 25 mg in the evening as well as losartan 25 mg once a day.  She reports that she will contact her primary care provider today regarding blood pressure elevation and control.  Her current medications are listed in the chart. ? ? ?Drug Allergies:  ?Allergies  ?Allergen Reactions  ? Ciprofloxacin   ? Citalopram  Hydrobromide   ? Escitalopram Oxalate   ? Paroxetine   ? Polysporin [Bacitracin-Polymyxin B]   ? ? ?Physical Exam: ?BP (!) 192/98   Pulse 76   Temp 97.8 ?F (36.6 ?C)   Resp 20   Ht '5\' 6"'$  (1.676 m)   Wt 177 lb (80.3 kg)   SpO2 98%   BMI 28.57 kg/m?   ? ?Physical Exam ?Vitals reviewed.  ?Constitutional:   ?   Appearance: Normal appearance.  ?HENT:  ?   Head: Normocephalic and atraumatic.  ?   Right Ear: Tympanic membrane normal.  ?   Left Ear: Tympanic membrane normal.  ?   Nose:  ?   Comments: Bilateral nares slightly erythematous with clear nasal drainage noted.  Pharynx normal.  Ears normal.  Eyes normal. ?   Mouth/Throat:  ?   Pharynx: Oropharynx is clear.  ?Eyes:  ?   Conjunctiva/sclera: Conjunctivae normal.  ?Cardiovascular:  ?   Rate and Rhythm: Normal rate and regular rhythm.  ?   Heart sounds: Normal heart sounds. No murmur heard. ?Pulmonary:  ?   Effort: Pulmonary effort is normal.  ?   Breath sounds: Normal breath sounds.  ?   Comments: Lungs clear to auscultation ?Musculoskeletal:     ?   General: Normal range of motion.  ?   Cervical back: Normal range of motion and neck supple.  ?Skin: ?   General: Skin is warm and dry.  ?Neurological:  ?   Mental Status: She is alert and oriented to person,  place, and time.  ?Psychiatric:     ?   Mood and Affect: Mood normal.     ?   Behavior: Behavior normal.     ?   Thought Content: Thought content normal.     ?   Judgment: Judgment normal.  ? ? ?Assessment and Plan: ?1. Seasonal and perennial allergic rhinoconjunctivitis   ?2. Seasonal allergic conjunctivitis   ?3. Essential hypertension   ? ? ?Meds ordered this encounter  ?Medications  ? EPINEPHrine (EPIPEN 2-PAK) 0.3 mg/0.3 mL IJ SOAJ injection  ?  Sig: Use as directed for life threatening allergic reactions  ?  Dispense:  1 each  ?  Refill:  3  ?  Please dispense MYLAN or TEVA devices only, order if necessary. Thanks.  ? ? ?Patient Instructions  ?Allergic rhinitis ?Continue allergen avoidance measures  directed toward pollens, dust mite, mold, cat, and cockroach as listed below ?Continue allergen immunotherapy and have access to an epinephrine autoinjector set.  Return next week for your allergy injection if you are feeling well ?Continue an antihistamine once a day as needed for runny nose or itch.Remember to rotate to a different antihistamine about every 3 months. Some examples of over the counter antihistamines include Zyrtec (cetirizine), Xyzal (levocetirizine), Allegra (fexofenadine), and Claritin (loratidine).  ?Continue Flonase 1 to 2 sprays in each nostril once a day as needed for a stuffy nose ?Consider saline nasal rinses as needed for nasal symptoms. Use this before any medicated nasal sprays for best result ? ? ?Allergic conjunctivitis ?Some over the counter eye drops include Pataday one drop in each eye once a day as needed for red, itchy eyes OR Zaditor one drop in each eye twice a day as needed for red itchy eyes. ? ?Your blood pressure was elevated at today's visit.  Contact your primary care provider immediately for management of your blood pressure ? ?Call the clinic if this treatment plan is not working well for you ? ?Follow up in 1 year or sooner if needed. ? ? ?Return in about 1 year (around 12/24/2022), or if symptoms worsen or fail to improve. ?  ? ?Thank you for the opportunity to care for this patient.  Please do not hesitate to contact me with questions. ? ?Gareth Morgan, FNP ?[facility] ? ? ? ? ? ?

## 2021-12-23 ENCOUNTER — Ambulatory Visit: Payer: 59

## 2021-12-23 ENCOUNTER — Ambulatory Visit (INDEPENDENT_AMBULATORY_CARE_PROVIDER_SITE_OTHER): Payer: 59 | Admitting: Family Medicine

## 2021-12-23 ENCOUNTER — Encounter: Payer: Self-pay | Admitting: Family Medicine

## 2021-12-23 VITALS — BP 198/102

## 2021-12-23 VITALS — BP 192/98 | HR 76 | Temp 97.8°F | Resp 20 | Ht 66.0 in | Wt 177.0 lb

## 2021-12-23 DIAGNOSIS — I10 Essential (primary) hypertension: Secondary | ICD-10-CM

## 2021-12-23 DIAGNOSIS — H1010 Acute atopic conjunctivitis, unspecified eye: Secondary | ICD-10-CM

## 2021-12-23 DIAGNOSIS — J302 Other seasonal allergic rhinitis: Secondary | ICD-10-CM | POA: Diagnosis not present

## 2021-12-23 DIAGNOSIS — H1013 Acute atopic conjunctivitis, bilateral: Secondary | ICD-10-CM | POA: Diagnosis not present

## 2021-12-23 MED ORDER — EPINEPHRINE 0.3 MG/0.3ML IJ SOAJ: INTRAMUSCULAR | 3 refills | Status: DC

## 2021-12-23 NOTE — Progress Notes (Signed)
? ?Subjective:  ? ? Patient ID: Madeline Wood, female    DOB: 1945/11/22, 76 y.o.   MRN: [id number] ? ?  ? ?Patient walked in today due to elevated blood pressure.  She was at her allergist and her systolic blood pressure was greater than 937 and her diastolic blood pressure was 100.  Therefore she came in and asked [place] to recheck her blood pressure.  Here today she had a similar blood pressure.  She is completely asymptomatic.  She denies any headache.  She denies any blurry vision.  She denies any chest pain.  She denies any shortness of breath.  There is no evidence of peripheral edema or fluid overload.  She denies any confusion or blurry vision or hematuria or decreased urine output.  Therefore I see no evidence that the patient needs to go to the emergency room for hypertensive urgency.  However her blood pressure is extremely high we need to address this. ? ? ?Past Medical History:  ?Diagnosis Date  ? Allergy   ? seasonal/ environmental/ animals gets allergy injections   ? 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 Angio-edema   ? Anxiety   ? Arthritis   ? Blood transfusion without reported diagnosis   ? 1985 with hysterectomy   ? CAD (coronary artery disease)   ? stent of distal RCA in 2006  ? Diabetes mellitus without complication ([place])   ? diet controlled-   ? Diverticulosis   ? GERD (gastroesophageal reflux disease)   ? Gout   ? H/O heart artery stent   ? Hiatal hernia   ? History of nuclear stress test 03/2011  ? negative lexiscan myoview; normal perfusion  ? Hyperlipidemia   ? Hypertension   ? Knee pain   ? Neuromuscular disorder ([place])   ? Hiatal Hernia   ? Obesity   ? Positive TB test   ? Venous insufficiency   ? ?Past Surgical History:  ?Procedure Laterality Date  ? ABDOMINAL HYSTERECTOMY    ? BACK SURGERY  2012  ? BREAST LUMPECTOMY    ? COLONOSCOPY  2011  ? CORONARY ANGIOPLASTY WITH STENT PLACEMENT  06/29/2005  ? Taxus 2.5x69m DES to distal RCA (Dr. MTami Ribas  ? TRANSTHORACIC ECHOCARDIOGRAM  12/20/2012  ? EF 590-24%  normal systolic function, mild hypokinesis of inf myocardium; calcified MV annulua, LA & RA mildly dilated  ? ?Current Outpatient Medications on File Prior to Visit  ?Medication Sig Dispense Refill  ? albuterol (VENTOLIN HFA) 108 (90 Base) MCG/ACT inhaler TAKE 2 PUFFS INTO LUNGS EVERY 6 HOURS AS NEEDED FOR WHEEZE OR SHORTNESS OF BREATH 8.5 each 3  ? ALPRAZolam (XANAX) 1 MG tablet TAKE 1 TABLET BY MOUTH TWICE A DAY 60 tablet 2  ? aspirin 81 MG tablet Take 81 mg by mouth daily.    ? atorvastatin (LIPITOR) 80 MG tablet TAKE 1 TABLET BY MOUTH EVERY DAY 90 tablet 2  ? cyclobenzaprine (FLEXERIL) 5 MG tablet TAKE 1 TABLET BY MOUTH THREE TIMES A DAY AS NEEDED FOR MUSCLE SPASMS 20 tablet 0  ? EPINEPHrine (EPIPEN 2-PAK) 0.3 mg/0.3 mL IJ SOAJ injection Use as directed for life threatening allergic reactions 1 each 3  ? fexofenadine (ALLEGRA) 180 MG tablet Take 1 tablet (180 mg total) by mouth daily. 30 tablet 5  ? fish oil-omega-3 fatty acids 1000 MG capsule Take 1 g by mouth. Takes occasionally    ? fluticasone (FLONASE) 50 MCG/ACT nasal spray PLACE 1-2 SPRAYS INTO BOTH NOSTRILS DAILY AS NEEDED FOR ALLERGIES  OR RHINITIS. 48 mL 0  ? hydrochlorothiazide (MICROZIDE) 12.5 MG capsule Take 1 capsule (12.5 mg total) by mouth daily. 90 capsule 3  ? losartan (COZAAR) 25 MG tablet TAKE 2 TABLETS ('50MG'$ ) BY MOUTH EVERY DAY 180 tablet 3  ? metoprolol tartrate (LOPRESSOR) 50 MG tablet TAKE 1 TABLET BY MOUTH EVERY MORNING AND 1 AND 1/2 TABLET EVERY EVENING 225 tablet 1  ? Multiple Vitamin (MULTIVITAMIN) tablet Take 1 tablet by mouth. occasionally    ? nitroGLYCERIN (NITROSTAT) 0.4 MG SL tablet PLACE 1 TABLET UNDER THE TONGUE EVERY 5 MINUTES AS NEEDED FOR CHEST PAIN. 25 tablet 2  ? pantoprazole (PROTONIX) 40 MG tablet Take 1 tablet (40 mg total) by mouth daily. 90 tablet 1  ? Wheat Dextrin (BENEFIBER PO) Take by mouth. Daily    ? zolpidem (AMBIEN) 10 MG tablet TAKE 1 TABLET BY MOUTH AT BEDTIME AS NEEDED FOR SLEEP 30 tablet 3  ?  [DISCONTINUED] omeprazole (PRILOSEC OTC) 20 MG tablet Take 1 tablet (20 mg total) by mouth daily. 30 tablet 1  ? ?No current facility-administered medications on file prior to visit.  ? ?Allergies  ?Allergen Reactions  ? Ciprofloxacin   ? Citalopram Hydrobromide   ? Escitalopram Oxalate   ? Paroxetine   ? Polysporin [Bacitracin-Polymyxin B]   ? ?Social History  ? ?Socioeconomic History  ? Marital status: Single  ?  Spouse name: Not on file  ? Number of children: 1  ? Years of education: Not on file  ? Highest education level: Not on file  ?Occupational History  ? Occupation: Biomedical engineer  ?Tobacco Use  ? Smoking status: Former  ?  Types: Cigarettes  ?  Quit date: 12/20/2001  ?  Years since quitting: 20.0  ? Smokeless tobacco: Never  ?Vaping Use  ? Vaping Use: Never used  ?Substance and Sexual Activity  ? Alcohol use: No  ?  Alcohol/week: 0.0 standard drinks  ? Drug use: No  ? Sexual activity: Not on file  ?Other Topics Concern  ? Not on file  ?Social History Narrative  ? Not on file  ? ?Social Determinants of Health  ? ?Financial Resource Strain: Not on file  ?Food Insecurity: Not on file  ?Transportation Needs: Not on file  ?Physical Activity: Not on file  ?Stress: Not on file  ?Social Connections: Not on file  ?Intimate Partner Violence: Not on file  ? ? ? ?Review of Systems  ?All other systems reviewed and are negative. ? ?   ?Objective:  ? Physical Exam ?Constitutional:   ?   General: She is not in acute distress. ?   Appearance: Normal appearance. She is not ill-appearing, toxic-appearing or diaphoretic.  ?Cardiovascular:  ?   Rate and Rhythm: Normal rate and regular rhythm.  ?   Pulses: Normal pulses.  ?   Heart sounds: Normal heart sounds. No murmur heard. ?  No friction rub. No gallop.  ?Pulmonary:  ?   Effort: Pulmonary effort is normal. No respiratory distress.  ?   Breath sounds: Normal breath sounds. No stridor. No wheezing, rhonchi or rales.  ?Abdominal:  ?   General: Bowel sounds are normal. There is no  distension.  ?   Palpations: Abdomen is soft.  ?   Tenderness: There is no abdominal tenderness. There is no rebound.  ?Musculoskeletal:  ?   Right lower leg: No edema.  ?   Left lower leg: No edema.  ?Neurological:  ?   Mental Status: She is alert.  ? ? ? ? ? ?   ?  Assessment & Plan:  ?Essential hypertension ?Increase losartan from 50 mg a day to 100 mg a day and increase hydrochlorothiazide 25 mg a day.  Recheck blood pressure here on Monday.  Seek medical attention over the weekend if she develops headache or chest pain or shortness of breath ?

## 2021-12-26 ENCOUNTER — Ambulatory Visit (INDEPENDENT_AMBULATORY_CARE_PROVIDER_SITE_OTHER): Payer: 59 | Admitting: Family Medicine

## 2021-12-26 ENCOUNTER — Telehealth: Payer: Self-pay

## 2021-12-26 VITALS — BP 156/90 | HR 80 | Temp 97.8°F | Ht 66.0 in | Wt 177.2 lb

## 2021-12-26 DIAGNOSIS — I10 Essential (primary) hypertension: Secondary | ICD-10-CM | POA: Diagnosis not present

## 2021-12-26 MED ORDER — LOSARTAN POTASSIUM 100 MG PO TABS: 100.0000 mg | ORAL_TABLET | Freq: Every day | ORAL | 3 refills | Status: DC

## 2021-12-26 MED ORDER — HYDROCHLOROTHIAZIDE 25 MG PO TABS: 25.0000 mg | ORAL_TABLET | Freq: Every day | ORAL | 3 refills | Status: DC

## 2021-12-26 NOTE — Telephone Encounter (Signed)
Per provider, called patient - DOB verified-  ? ?Patient advised that she was worked in an d seen by her PCP on Friday, 12/23/21. PCP increased her BP meds - she has follow up appt @ 2:30pm this afternoon. ? ?Patient states she feels alittle better - appreciates the call to see how she was doing. ?

## 2021-12-26 NOTE — Progress Notes (Signed)
? ?Subjective:  ? ? Patient ID: Madeline Wood, female    DOB: [birth year], 76 y.o.   MRN: [id number] ? ?  ?12/23/21 ?Patient walked in today due to elevated blood pressure.  She was at her allergist and her systolic blood pressure was greater than 557 and her diastolic blood pressure was 100.  Therefore she came in and asked [place] to recheck her blood pressure.  Here today she had a similar blood pressure.  She is completely asymptomatic.  She denies any headache.  She denies any blurry vision.  She denies any chest pain.  She denies any shortness of breath.  There is no evidence of peripheral edema or fluid overload.  She denies any confusion or blurry vision or hematuria or decreased urine output.  Therefore I see no evidence that the patient needs to go to the emergency room for hypertensive urgency.  However her blood pressure is extremely high we need to address this.  At that time, my plan was: ?Increase losartan from 50 mg a day to 100 mg a day and increase hydrochlorothiazide 25 mg a day.  Recheck blood pressure here on Monday.  Seek medical attention over the weekend if she develops headache or chest pain or shortness of breath ? ?12/26/21 ?Patient's blood pressure is much better today.  She has been consistently getting systolic blood pressures in the 150s over the weekend and diastolic blood pressures between 90 and 100 which is consistent with the blood pressure we have here today.  She denies any chest pain shortness of breath or dyspnea on exertion.  She denies any headache. ? ? ?Past Medical History:  ?Diagnosis Date  ? Allergy   ? seasonal/ environmental/ animals gets allergy injections   ? 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 Angio-edema   ? Anxiety   ? Arthritis   ? Blood transfusion without reported diagnosis   ? 1985 with hysterectomy   ? CAD (coronary artery disease)   ? stent of distal RCA in 2006  ? Diabetes mellitus without complication ([place])   ? diet controlled-   ? Diverticulosis   ? GERD (gastroesophageal  reflux disease)   ? Gout   ? H/O heart artery stent   ? Hiatal hernia   ? History of nuclear stress test 03/2011  ? negative lexiscan myoview; normal perfusion  ? Hyperlipidemia   ? Hypertension   ? Knee pain   ? Neuromuscular disorder ([place])   ? Hiatal Hernia   ? Obesity   ? Positive TB test   ? Venous insufficiency   ? ?Past Surgical History:  ?Procedure Laterality Date  ? ABDOMINAL HYSTERECTOMY    ? BACK SURGERY  2012  ? BREAST LUMPECTOMY    ? COLONOSCOPY  2011  ? CORONARY ANGIOPLASTY WITH STENT PLACEMENT  06/29/2005  ? Taxus 2.5x20m DES to distal RCA (Dr. MTami Ribas  ? TRANSTHORACIC ECHOCARDIOGRAM  12/20/2012  ? EF 532-20% normal systolic function, mild hypokinesis of inf myocardium; calcified MV annulua, LA & RA mildly dilated  ? ?Current Outpatient Medications on File Prior to Visit  ?Medication Sig Dispense Refill  ? albuterol (VENTOLIN HFA) 108 (90 Base) MCG/ACT inhaler TAKE 2 PUFFS INTO LUNGS EVERY 6 HOURS AS NEEDED FOR WHEEZE OR SHORTNESS OF BREATH 8.5 each 3  ? ALPRAZolam (XANAX) 1 MG tablet TAKE 1 TABLET BY MOUTH TWICE A DAY 60 tablet 2  ? aspirin 81 MG tablet Take 81 mg by mouth daily.    ? atorvastatin (LIPITOR) 80 MG tablet  TAKE 1 TABLET BY MOUTH EVERY DAY 90 tablet 2  ? cyclobenzaprine (FLEXERIL) 5 MG tablet TAKE 1 TABLET BY MOUTH THREE TIMES A DAY AS NEEDED FOR MUSCLE SPASMS 20 tablet 0  ? EPINEPHrine (EPIPEN 2-PAK) 0.3 mg/0.3 mL IJ SOAJ injection Use as directed for life threatening allergic reactions 1 each 3  ? fexofenadine (ALLEGRA) 180 MG tablet Take 1 tablet (180 mg total) by mouth daily. 30 tablet 5  ? fish oil-omega-3 fatty acids 1000 MG capsule Take 1 g by mouth. Takes occasionally    ? fluticasone (FLONASE) 50 MCG/ACT nasal spray PLACE 1-2 SPRAYS INTO BOTH NOSTRILS DAILY AS NEEDED FOR ALLERGIES OR RHINITIS. 48 mL 0  ? hydrochlorothiazide (MICROZIDE) 12.5 MG capsule Take 1 capsule (12.5 mg total) by mouth daily. 90 capsule 3  ? losartan (COZAAR) 25 MG tablet TAKE 2 TABLETS ('50MG'$ ) BY MOUTH EVERY  DAY 180 tablet 3  ? metoprolol tartrate (LOPRESSOR) 50 MG tablet TAKE 1 TABLET BY MOUTH EVERY MORNING AND 1 AND 1/2 TABLET EVERY EVENING 225 tablet 1  ? Multiple Vitamin (MULTIVITAMIN) tablet Take 1 tablet by mouth. occasionally    ? nitroGLYCERIN (NITROSTAT) 0.4 MG SL tablet PLACE 1 TABLET UNDER THE TONGUE EVERY 5 MINUTES AS NEEDED FOR CHEST PAIN. 25 tablet 2  ? pantoprazole (PROTONIX) 40 MG tablet Take 1 tablet (40 mg total) by mouth daily. 90 tablet 1  ? Wheat Dextrin (BENEFIBER PO) Take by mouth. Daily    ? zolpidem (AMBIEN) 10 MG tablet TAKE 1 TABLET BY MOUTH AT BEDTIME AS NEEDED FOR SLEEP 30 tablet 3  ? [DISCONTINUED] omeprazole (PRILOSEC OTC) 20 MG tablet Take 1 tablet (20 mg total) by mouth daily. 30 tablet 1  ? ?No current facility-administered medications on file prior to visit.  ? ?Allergies  ?Allergen Reactions  ? Ciprofloxacin   ? Citalopram Hydrobromide   ? Escitalopram Oxalate   ? Paroxetine   ? Polysporin [Bacitracin-Polymyxin B]   ? ?Social History  ? ?Socioeconomic History  ? Marital status: Single  ?  Spouse name: Not on file  ? Number of children: 1  ? Years of education: Not on file  ? Highest education level: Not on file  ?Occupational History  ? Occupation: Biomedical engineer  ?Tobacco Use  ? Smoking status: Former  ?  Types: Cigarettes  ?  Quit date: 12/20/2001  ?  Years since quitting: 20.0  ? Smokeless tobacco: Never  ?Vaping Use  ? Vaping Use: Never used  ?Substance and Sexual Activity  ? Alcohol use: No  ?  Alcohol/week: 0.0 standard drinks  ? Drug use: No  ? Sexual activity: Not on file  ?Other Topics Concern  ? Not on file  ?Social History Narrative  ? Not on file  ? ?Social Determinants of Health  ? ?Financial Resource Strain: Not on file  ?Food Insecurity: Not on file  ?Transportation Needs: Not on file  ?Physical Activity: Not on file  ?Stress: Not on file  ?Social Connections: Not on file  ?Intimate Partner Violence: Not on file  ? ? ? ?Review of Systems  ?All other systems reviewed and are  negative. ? ?   ?Objective:  ? Physical Exam ?Constitutional:   ?   General: She is not in acute distress. ?   Appearance: Normal appearance. She is not ill-appearing, toxic-appearing or diaphoretic.  ?Cardiovascular:  ?   Rate and Rhythm: Normal rate and regular rhythm.  ?   Pulses: Normal pulses.  ?   Heart sounds: Normal  heart sounds. No murmur heard. ?  No friction rub. No gallop.  ?Pulmonary:  ?   Effort: Pulmonary effort is normal. No respiratory distress.  ?   Breath sounds: Normal breath sounds. No stridor. No wheezing, rhonchi or rales.  ?Abdominal:  ?   General: Bowel sounds are normal. There is no distension.  ?   Palpations: Abdomen is soft.  ?   Tenderness: There is no abdominal tenderness. There is no rebound.  ?Musculoskeletal:  ?   Right lower leg: No edema.  ?   Left lower leg: No edema.  ?Neurological:  ?   Mental Status: She is alert.  ? ? ? ? ? ?   ?Assessment & Plan:  ?Benign essential HTN - Plan: COMPLETE METABOLIC PANEL WITH GFR, CBC with Differential/Platelet ?I do not believe that the medication changes that we made on Friday have had a chance to take full effect.  Therefore I recommended no additional changes for the next week.  Over her body to adjust to the new medication and then recheck her blood pressure in 1 week.  If consistently elevated in 1 week I would recommend adding amlodipine.  Check BMP today to monitor renal function. ?

## 2021-12-26 NOTE — Telephone Encounter (Signed)
Thank you :)

## 2021-12-26 NOTE — Telephone Encounter (Signed)
-----   Message from Dara Hoyer, FNP sent at 12/23/2021  5:26 PM EDT ----- ?Can you please call this patient and find out if her BP is more controlled. If not can you please call her PCP and give them a notice that her BP was high and she will need an appointment asap. Thank you ? ?

## 2021-12-28 LAB — COMPLETE METABOLIC PANEL WITH GFR
AG Ratio: 1.3 (calc) (ref 1.0–2.5)
ALT: 11 U/L (ref 6–29)
AST: 25 U/L (ref 10–35)
Albumin: 3.9 g/dL (ref 3.6–5.1)
Alkaline phosphatase (APISO): 40 U/L (ref 37–153)
BUN: 12 mg/dL (ref 7–25)
CO2: 30 mmol/L (ref 20–32)
Calcium: 10.1 mg/dL (ref 8.6–10.4)
Chloride: 92 mmol/L — ABNORMAL LOW (ref 98–110)
Creat: 0.68 mg/dL (ref 0.60–1.00)
Globulin: 3 g/dL (calc) (ref 1.9–3.7)
Glucose, Bld: 91 mg/dL (ref 65–99)
Potassium: 3.7 mmol/L (ref 3.5–5.3)
Sodium: 131 mmol/L — ABNORMAL LOW (ref 135–146)
Total Bilirubin: 0.6 mg/dL (ref 0.2–1.2)
Total Protein: 6.9 g/dL (ref 6.1–8.1)
eGFR: 90 mL/min/{1.73_m2} (ref >=60)

## 2021-12-28 LAB — CBC WITH DIFFERENTIAL/PLATELET
Absolute Monocytes: 824 cells/uL (ref 200–950)
Basophils Absolute: 34 cells/uL (ref 0–200)
Basophils Relative: 0.5 %
Eosinophils Absolute: 74 cells/uL (ref 15–500)
Eosinophils Relative: 1.1 %
HCT: 39.4 % (ref 35.0–45.0)
Hemoglobin: 12.7 g/dL (ref 11.7–15.5)
Lymphs Abs: 2358 cells/uL (ref 850–3900)
MCH: 29 pg (ref 27.0–33.0)
MCHC: 32.2 g/dL (ref 32.0–36.0)
MCV: 90 fL (ref 80.0–100.0)
MPV: 9.3 fL (ref 7.5–12.5)
Monocytes Relative: 12.3 %
Neutro Abs: 3410 cells/uL (ref 1500–7800)
Neutrophils Relative %: 50.9 %
Platelets: 240 10*3/uL (ref 140–400)
RBC: 4.38 10*6/uL (ref 3.80–5.10)
RDW: 13.2 % (ref 11.0–15.0)
Total Lymphocyte: 35.2 %
WBC: 6.7 10*3/uL (ref 3.8–10.8)

## 2021-12-29 ENCOUNTER — Ambulatory Visit: Payer: 59

## 2021-12-29 NOTE — Patient Instructions (Incomplete)
Madeline Wood , ?Thank you for taking time to come for your Medicare Wellness Visit. I appreciate your ongoing commitment to your health goals. Please review the following plan we discussed and let me know if I can assist you in the future.  ? ?Screening recommendations/referrals: ?Colonoscopy: Done 06/22/2020. ?Mammogram: Done 04/11/2021 Repeat annually ?Bone Density: Done 06/18/2020 Repeat every 2 years ? ?Recommended yearly ophthalmology/optometry visit for glaucoma screening and checkup ?Recommended yearly dental visit for hygiene and checkup ? ?Vaccinations: ?Influenza vaccine: Done 07/20/2021 Repeat annually ? ?Pneumococcal vaccine: Done 08/18/2013 and 02/25/2014 ?Tdap vaccine: Done 01/25/2012 Repeat in 10 years ? ?Shingles vaccine: Done 07/02/2014 ?Covid-19:Done 09/08/2021, 06/28/2020, 10/31/2019 and 10/20/2019.  ? ?Advanced directives:  ? ?Conditions/risks identified: *** ? ?Next appointment: Follow up in one year for your annual wellness visit *** ? ? ?Preventive Care 23 Years and Older, Female ?Preventive care refers to lifestyle choices and visits with your health care provider that can promote health and wellness. ?What does preventive care include? ?A yearly physical exam. This is also called an annual well check. ?Dental exams once or twice a year. ?Routine eye exams. Ask your health care provider how often you should have your eyes checked. ?Personal lifestyle choices, including: ?Daily care of your teeth and gums. ?Regular physical activity. ?Eating a healthy diet. ?Avoiding tobacco and drug use. ?Limiting alcohol use. ?Practicing safe sex. ?Taking low-dose aspirin every day. ?Taking vitamin and mineral supplements as recommended by your health care provider. ?What happens during an annual well check? ?The services and screenings done by your health care provider during your annual well check will depend on your age, overall health, lifestyle risk factors, and family history of disease. ?Counseling  ?Your health  care provider may ask you questions about your: ?Alcohol use. ?Tobacco use. ?Drug use. ?Emotional well-being. ?Home and relationship well-being. ?Sexual activity. ?Eating habits. ?History of falls. ?Memory and ability to understand (cognition). ?Work and work Statistician. ?Reproductive health. ?Screening  ?You may have the following tests or measurements: ?Height, weight, and BMI. ?Blood pressure. ?Lipid and cholesterol levels. These may be checked every 5 years, or more frequently if you are over 41 years old. ?Skin check. ?Lung cancer screening. You may have this screening every year starting at age 44 if you have a 30-pack-year history of smoking and currently smoke or have quit within the past 15 years. ?Fecal occult blood test (FOBT) of the stool. You may have this test every year starting at age 57. ?Flexible sigmoidoscopy or colonoscopy. You may have a sigmoidoscopy every 5 years or a colonoscopy every 10 years starting at age 57. ?Hepatitis C blood test. ?Hepatitis B blood test. ?Sexually transmitted disease (STD) testing. ?Diabetes screening. This is done by checking your blood sugar (glucose) after you have not eaten for a while (fasting). You may have this done every 1-3 years. ?Bone density scan. This is done to screen for osteoporosis. You may have this done starting at age 65. ?Mammogram. This may be done every 1-2 years. Talk to your health care provider about how often you should have regular mammograms. ?Talk with your health care provider about your test results, treatment options, and if necessary, the need for more tests. ?Vaccines  ?Your health care provider may recommend certain vaccines, such as: ?Influenza vaccine. This is recommended every year. ?Tetanus, diphtheria, and acellular pertussis (Tdap, Td) vaccine. You may need a Td booster every 10 years. ?Zoster vaccine. You may need this after age 32. ?Pneumococcal 13-valent conjugate (PCV13) vaccine. One dose  is recommended after age  58. ?Pneumococcal polysaccharide (PPSV23) vaccine. One dose is recommended after age 13. ?Talk to your health care provider about which screenings and vaccines you need and how often you need them. ?This information is not intended to replace advice given to you by your health care provider. Make sure you discuss any questions you have with your health care provider. ?Document Released: 09/10/2015 Document Revised: 05/03/2016 Document Reviewed: 06/15/2015 ?Elsevier Interactive Patient Education ? 2017 [employer]. ? ?Fall Prevention in the Home ?Falls can cause injuries. They can happen to people of all ages. There are many things you can do to make your home safe and to help prevent falls. ?What can I do on the outside of my home? ?Regularly fix the edges of walkways and driveways and fix any cracks. ?Remove anything that might make you trip as you walk through a door, such as a raised step or threshold. ?Trim any bushes or trees on the path to your home. ?Use bright outdoor lighting. ?Clear any walking paths of anything that might make someone trip, such as rocks or tools. ?Regularly check to see if handrails are loose or broken. Make sure that both sides of any steps have handrails. ?Any raised decks and porches should have guardrails on the edges. ?Have any leaves, snow, or ice cleared regularly. ?Use sand or salt on walking paths during winter. ?Clean up any spills in your garage right away. This includes oil or grease spills. ?What can I do in the bathroom? ?Use night lights. ?Install grab bars by the toilet and in the tub and shower. Do not use towel bars as grab bars. ?Use non-skid mats or decals in the tub or shower. ?If you need to sit down in the shower, use a plastic, non-slip stool. ?Keep the floor dry. Clean up any water that spills on the floor as soon as it happens. ?Remove soap buildup in the tub or shower regularly. ?Attach bath mats securely with double-sided non-slip rug tape. ?Do not have throw  rugs and other things on the floor that can make you trip. ?What can I do in the bedroom? ?Use night lights. ?Make sure that you have a light by your bed that is easy to reach. ?Do not use any sheets or blankets that are too big for your bed. They should not hang down onto the floor. ?Have a firm chair that has side arms. You can use this for support while you get dressed. ?Do not have throw rugs and other things on the floor that can make you trip. ?What can I do in the kitchen? ?Clean up any spills right away. ?Avoid walking on wet floors. ?Keep items that you use a lot in easy-to-reach places. ?If you need to reach something above you, use a strong step stool that has a grab bar. ?Keep electrical cords out of the way. ?Do not use floor polish or wax that makes floors slippery. If you must use wax, use non-skid floor wax. ?Do not have throw rugs and other things on the floor that can make you trip. ?What can I do with my stairs? ?Do not leave any items on the stairs. ?Make sure that there are handrails on both sides of the stairs and use them. Fix handrails that are broken or loose. Make sure that handrails are as long as the stairways. ?Check any carpeting to make sure that it is firmly attached to the stairs. Fix any carpet that is loose or worn. ?Avoid  having throw rugs at the top or bottom of the stairs. If you do have throw rugs, attach them to the floor with carpet tape. ?Make sure that you have a light switch at the top of the stairs and the bottom of the stairs. If you do not have them, ask someone to add them for you. ?What else can I do to help prevent falls? ?Wear shoes that: ?Do not have high heels. ?Have rubber bottoms. ?Are comfortable and fit you well. ?Are closed at the toe. Do not wear sandals. ?If you use a stepladder: ?Make sure that it is fully opened. Do not climb a closed stepladder. ?Make sure that both sides of the stepladder are locked into place. ?Ask someone to hold it for you, if  possible. ?Clearly mark and make sure that you can see: ?Any grab bars or handrails. ?First and last steps. ?Where the edge of each step is. ?Use tools that help you move around (mobility aids) if they are needed.

## 2022-01-02 ENCOUNTER — Telehealth: Payer: Self-pay

## 2022-01-02 MED ORDER — AMLODIPINE BESYLATE 5 MG PO TABS: 5.0000 mg | ORAL_TABLET | Freq: Every day | ORAL | 1 refills | Status: DC

## 2022-01-02 NOTE — Telephone Encounter (Signed)
Per OV 12/26/21, Here are her B/P's ? ?12/27/21: 185/87 ?12/28/21: 161/85 ?12/29/21: 162/82 ?12/30/21: 150/97 ?12/31/21: 141/70 ?01/01/22: 138/72 ?01/02/22: 132/66 ? ? ?

## 2022-01-02 NOTE — Telephone Encounter (Signed)
Rx call into pharmacy ?

## 2022-01-12 ENCOUNTER — Ambulatory Visit (INDEPENDENT_AMBULATORY_CARE_PROVIDER_SITE_OTHER): Payer: 59

## 2022-01-12 DIAGNOSIS — J309 Allergic rhinitis, unspecified: Secondary | ICD-10-CM | POA: Diagnosis not present

## 2022-01-18 NOTE — Patient Instructions (Incomplete)
Madeline Wood , Thank you for taking time to come for your Medicare Wellness Visit. I appreciate your ongoing commitment to your health goals. Please review the following plan we discussed and let me know if I can assist you in the future.   Screening recommendations/referrals: Colonoscopy: Done 06/22/2020  Mammogram: Done 04/11/2021 Repeat annually  Bone Density: Done 06/18/2020. Repeat every 2 years  Recommended yearly ophthalmology/optometry visit for glaucoma screening and checkup Recommended yearly dental visit for hygiene and checkup  Vaccinations: Influenza vaccine: Done 07/20/2021 Repeat annually  Pneumococcal vaccine: Done 08/18/2013 and 02/25/2014 Tdap vaccine: Done 01/25/2012 Repeat in 10 years  Shingles vaccine: Done 07/02/2014. Shingrix discussed.   Covid-19:Done 09/08/2021, 06/28/2020, 10/31/2019, 10/03/2019.  Advanced directives:   Conditions/risks identified: Aim for 30 minutes of exercise or brisk walking, 6-8 glasses of water, and 5 servings of fruits and vegetables each day.   Next appointment: Follow up in one year for your annual wellness visit 2024.   Preventive Care 21 Years and Older, Female Preventive care refers to lifestyle choices and visits with your health care provider that can promote health and wellness. What does preventive care include? A yearly physical exam. This is also called an annual well check. Dental exams once or twice a year. Routine eye exams. Ask your health care provider how often you should have your eyes checked. Personal lifestyle choices, including: Wood care of your teeth and gums. Regular physical activity. Eating a healthy diet. Avoiding tobacco and drug use. Limiting alcohol use. Practicing safe sex. Taking low-dose aspirin every day. Taking vitamin and mineral supplements as recommended by your health care provider. What happens during an annual well check? The services and screenings done by your health care provider during  your annual well check will depend on your age, overall health, lifestyle risk factors, and family history of disease. Counseling  Your health care provider may ask you questions about your: Alcohol use. Tobacco use. Drug use. Emotional well-being. Home and relationship well-being. Sexual activity. Eating habits. History of falls. Memory and ability to understand (cognition). Work and work Statistician. Reproductive health. Screening  You may have the following tests or measurements: Height, weight, and BMI. Blood pressure. Lipid and cholesterol levels. These may be checked every 5 years, or more frequently if you are over 40 years old. Skin check. Lung cancer screening. You may have this screening every year starting at age 48 if you have a 30-pack-year history of smoking and currently smoke or have quit within the past 15 years. Fecal occult blood test (FOBT) of the stool. You may have this test every year starting at age 66. Flexible sigmoidoscopy or colonoscopy. You may have a sigmoidoscopy every 5 years or a colonoscopy every 10 years starting at age 38. Hepatitis C blood test. Hepatitis B blood test. Sexually transmitted disease (STD) testing. Diabetes screening. This is done by checking your blood sugar (glucose) after you have not eaten for a while (fasting). You may have this done every 1-3 years. Bone density scan. This is done to screen for osteoporosis. You may have this done starting at age 80. Mammogram. This may be done every 1-2 years. Talk to your health care provider about how often you should have regular mammograms. Talk with your health care provider about your test results, treatment options, and if necessary, the need for more tests. Vaccines  Your health care provider may recommend certain vaccines, such as: Influenza vaccine. This is recommended every year. Tetanus, diphtheria, and acellular pertussis (Tdap, Td)  vaccine. You may need a Td booster every 10  years. Zoster vaccine. You may need this after age 48. Pneumococcal 13-valent conjugate (PCV13) vaccine. One dose is recommended after age 27. Pneumococcal polysaccharide (PPSV23) vaccine. One dose is recommended after age 83. Talk to your health care provider about which screenings and vaccines you need and how often you need them. This information is not intended to replace advice given to you by your health care provider. Make sure you discuss any questions you have with your health care provider. Document Released: 09/10/2015 Document Revised: 05/03/2016 Document Reviewed: 06/15/2015 Elsevier Interactive Patient Education  2017 [place] Prevention in the Home Falls can cause injuries. They can happen to people of all ages. There are many things you can do to make your home safe and to help prevent falls. What can I do on the outside of my home? Regularly fix the edges of walkways and driveways and fix any cracks. Remove anything that might make you trip as you walk through a door, such as a raised step or threshold. Trim any bushes or trees on the path to your home. Use bright outdoor lighting. Clear any walking paths of anything that might make someone trip, such as rocks or tools. Regularly check to see if handrails are loose or broken. Make sure that both sides of any steps have handrails. Any raised decks and porches should have guardrails on the edges. Have any leaves, snow, or ice cleared regularly. Use sand or salt on walking paths during winter. Clean up any spills in your garage right away. This includes oil or grease spills. What can I do in the bathroom? Use night lights. Install grab bars by the toilet and in the tub and shower. Do not use towel bars as grab bars. Use non-skid mats or decals in the tub or shower. If you need to sit down in the shower, use a plastic, non-slip stool. Keep the floor dry. Clean up any water that spills on the floor as soon as it  happens. Remove soap buildup in the tub or shower regularly. Attach bath mats securely with double-sided non-slip rug tape. Do not have throw rugs and other things on the floor that can make you trip. What can I do in the bedroom? Use night lights. Make sure that you have a light by your bed that is easy to reach. Do not use any sheets or blankets that are too big for your bed. They should not hang down onto the floor. Have a firm chair that has side arms. You can use this for support while you get dressed. Do not have throw rugs and other things on the floor that can make you trip. What can I do in the kitchen? Clean up any spills right away. Avoid walking on wet floors. Keep items that you use a lot in easy-to-reach places. If you need to reach something above you, use a strong step stool that has a grab bar. Keep electrical cords out of the way. Do not use floor polish or wax that makes floors slippery. If you must use wax, use non-skid floor wax. Do not have throw rugs and other things on the floor that can make you trip. What can I do with my stairs? Do not leave any items on the stairs. Make sure that there are handrails on both sides of the stairs and use them. Fix handrails that are broken or loose. Make sure that handrails are as long  as the stairways. Check any carpeting to make sure that it is firmly attached to the stairs. Fix any carpet that is loose or worn. Avoid having throw rugs at the top or bottom of the stairs. If you do have throw rugs, attach them to the floor with carpet tape. Make sure that you have a light switch at the top of the stairs and the bottom of the stairs. If you do not have them, ask someone to add them for you. What else can I do to help prevent falls? Wear shoes that: Do not have high heels. Have rubber bottoms. Are comfortable and fit you well. Are closed at the toe. Do not wear sandals. If you use a stepladder: Make sure that it is fully opened.  Do not climb a closed stepladder. Make sure that both sides of the stepladder are locked into place. Ask someone to hold it for you, if possible. Clearly mark and make sure that you can see: Any grab bars or handrails. First and last steps. Where the edge of each step is. Use tools that help you move around (mobility aids) if they are needed. These include: Canes. Walkers. Scooters. Crutches. Turn on the lights when you go into a dark area. Replace any light bulbs as soon as they burn out. Set up your furniture so you have a clear path. Avoid moving your furniture around. If any of your floors are uneven, fix them. If there are any pets around you, be aware of where they are. Review your medicines with your doctor. Some medicines can make you feel dizzy. This can increase your chance of falling. Ask your doctor what other things that you can do to help prevent falls. This information is not intended to replace advice given to you by your health care provider. Make sure you discuss any questions you have with your health care provider. Document Released: 06/10/2009 Document Revised: 01/20/2016 Document Reviewed: 09/18/2014 Elsevier Interactive Patient Education  2017 [employer].

## 2022-01-19 ENCOUNTER — Ambulatory Visit: Payer: 59

## 2022-01-19 ENCOUNTER — Telehealth: Payer: Medicare Other

## 2022-01-24 ENCOUNTER — Ambulatory Visit (INDEPENDENT_AMBULATORY_CARE_PROVIDER_SITE_OTHER): Payer: 59

## 2022-01-24 DIAGNOSIS — J309 Allergic rhinitis, unspecified: Secondary | ICD-10-CM

## 2022-01-30 ENCOUNTER — Other Ambulatory Visit: Payer: Self-pay | Admitting: Internal Medicine

## 2022-01-30 ENCOUNTER — Other Ambulatory Visit: Payer: Self-pay | Admitting: Family Medicine

## 2022-01-30 DIAGNOSIS — K219 Gastro-esophageal reflux disease without esophagitis: Secondary | ICD-10-CM

## 2022-02-02 ENCOUNTER — Ambulatory Visit (INDEPENDENT_AMBULATORY_CARE_PROVIDER_SITE_OTHER): Payer: 59

## 2022-02-02 DIAGNOSIS — J309 Allergic rhinitis, unspecified: Secondary | ICD-10-CM | POA: Diagnosis not present

## 2022-02-09 ENCOUNTER — Ambulatory Visit (INDEPENDENT_AMBULATORY_CARE_PROVIDER_SITE_OTHER): Payer: 59

## 2022-02-09 DIAGNOSIS — J309 Allergic rhinitis, unspecified: Secondary | ICD-10-CM

## 2022-02-16 ENCOUNTER — Telehealth: Payer: Self-pay

## 2022-02-16 ENCOUNTER — Ambulatory Visit (INDEPENDENT_AMBULATORY_CARE_PROVIDER_SITE_OTHER): Payer: Medicare Other

## 2022-02-16 VITALS — Ht 66.0 in | Wt 167.0 lb

## 2022-02-16 DIAGNOSIS — Z1231 Encounter for screening mammogram for malignant neoplasm of breast: Secondary | ICD-10-CM | POA: Diagnosis not present

## 2022-02-16 DIAGNOSIS — Z0000 Encounter for general adult medical examination without abnormal findings: Secondary | ICD-10-CM

## 2022-02-16 NOTE — Progress Notes (Cosign Needed)
Subjective:   Madeline Wood is a 76 y.o. female who presents for Medicare Annual (Subsequent) preventive examination. Virtual Visit via Telephone Note  I connected with  Madeline Wood on 02/16/22 at 10:00 AM EDT by telephone and verified that I am speaking with the correct person using two identifiers.  Location: Patient: HOME Provider: [facility] Persons participating in the virtual visit: patient/Nurse Health Advisor   I discussed the limitations, risks, security and privacy concerns of performing an evaluation and management service by telephone and the availability of in person appointments. The patient expressed understanding and agreed to proceed.  Interactive audio and video telecommunications were attempted between this nurse and patient, however failed, due to patient having technical difficulties OR patient did not have access to video capability.  We continued and completed visit with audio only.  Some vital signs may be absent or patient reported.   Chriss Driver, LPN  Review of Systems     Cardiac Risk Factors include: advanced age (>49mn, >>58women);diabetes mellitus;hypertension;dyslipidemia;sedentary lifestyle     Objective:    Today's Vitals   02/16/22 1002  Weight: 167 lb (75.8 kg)  Height: '5\' 6"'$  (1.676 m)   Body mass index is 26.95 kg/m.     02/16/2022   10:15 AM 12/24/2020   10:39 AM 04/14/2020    8:14 PM 07/27/2017    7:35 PM  Advanced Directives  Does Patient Have a Medical Advance Directive? Yes Yes No No;Yes  Type of AParamedicof APaintLiving will [place] will  [place] Does patient want to make changes to medical advance directive?  No - Patient declined    Copy of [place] Chart? No - copy requested No - copy requested    Would patient like information on creating a medical advance directive?  No - Patient declined No - Patient declined No -  Patient declined    Current Medications (verified) Outpatient Encounter Medications as of 02/16/2022  Medication Sig   albuterol (VENTOLIN HFA) 108 (90 Base) MCG/ACT inhaler TAKE 2 PUFFS INTO LUNGS EVERY 6 HOURS AS NEEDED FOR WHEEZE OR SHORTNESS OF BREATH   ALPRAZolam (XANAX) 1 MG tablet TAKE 1 TABLET BY MOUTH TWICE A DAY   amLODipine (NORVASC) 5 MG tablet Take 1 tablet (5 mg total) by mouth daily.   aspirin 81 MG tablet Take 81 mg by mouth daily.   atorvastatin (LIPITOR) 80 MG tablet TAKE 1 TABLET BY MOUTH EVERY DAY   cyclobenzaprine (FLEXERIL) 5 MG tablet TAKE 1 TABLET BY MOUTH THREE TIMES A DAY AS NEEDED FOR MUSCLE SPASMS   EPINEPHrine (EPIPEN 2-PAK) 0.3 mg/0.3 mL IJ SOAJ injection Use as directed for life threatening allergic reactions   fexofenadine (ALLEGRA) 180 MG tablet Take 1 tablet (180 mg total) by mouth daily.   fish oil-omega-3 fatty acids 1000 MG capsule Take 1 g by mouth. Takes occasionally   fluticasone (FLONASE) 50 MCG/ACT nasal spray PLACE 1-2 SPRAYS INTO BOTH NOSTRILS DAILY AS NEEDED FOR ALLERGIES OR RHINITIS.   hydrochlorothiazide (HYDRODIURIL) 25 MG tablet Take 1 tablet (25 mg total) by mouth daily.   losartan (COZAAR) 100 MG tablet Take 1 tablet (100 mg total) by mouth daily.   metoprolol tartrate (LOPRESSOR) 50 MG tablet TAKE 1 TABLET BY MOUTH EVERY MORNING AND 1 AND 1/2 TABLET EVERY EVENING   Multiple Vitamin (MULTIVITAMIN) tablet Take 1 tablet by mouth. occasionally   nitroGLYCERIN (NITROSTAT) 0.4 MG SL  tablet PLACE 1 TABLET UNDER THE TONGUE EVERY 5 MINUTES AS NEEDED FOR CHEST PAIN.   pantoprazole (PROTONIX) 40 MG tablet TAKE 1 TABLET BY MOUTH EVERY DAY   Wheat Dextrin (BENEFIBER PO) Take by mouth. Daily   zolpidem (AMBIEN) 10 MG tablet TAKE 1 TABLET BY MOUTH AT BEDTIME AS NEEDED FOR SLEEP   [DISCONTINUED] omeprazole (PRILOSEC OTC) 20 MG tablet Take 1 tablet (20 mg total) by mouth daily.   No facility-administered encounter medications on file as of 02/16/2022.     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    Angio-edema    Anxiety    Arthritis    Blood transfusion without reported diagnosis    1985 with hysterectomy    CAD (coronary artery disease)    stent of distal RCA in 2006   Diabetes mellitus without complication ([place])    diet controlled-    Diverticulosis    GERD (gastroesophageal reflux disease)    Gout    H/O heart artery stent    Hiatal hernia    History of nuclear stress test 03/2011   negative lexiscan myoview; normal perfusion   Hyperlipidemia    Hypertension    Knee pain    Neuromuscular disorder ([place])    Hiatal Hernia    Obesity    Positive TB test    Venous insufficiency    Past Surgical History:  Procedure Laterality Date   ABDOMINAL HYSTERECTOMY     BACK SURGERY  2012   BREAST LUMPECTOMY     COLONOSCOPY  2011   CORONARY ANGIOPLASTY WITH STENT PLACEMENT  06/29/2005   Taxus 2.5x20m DES to distal RCA (Dr. MTami Ribas   TRANSTHORACIC ECHOCARDIOGRAM  12/20/2012   EF 512-87% normal systolic function, mild hypokinesis of inf myocardium; calcified MV annulua, LA & RA mildly dilated   Family History  Problem Relation Age of Onset   Ovarian cancer Mother    Other Father        homicide   Diabetes Sister    Hypertension Sister    Pancreatic cancer Brother    Heart attack Brother        x2   Hypertension Brother    Esophageal cancer Brother    Prostate cancer Brother    Colon cancer Neg Hx    Colon polyps Neg Hx    Rectal cancer Neg Hx    Stomach cancer Neg Hx    Social History   Socioeconomic History   Marital status: Single    Spouse name: Not on file   Number of children: 1   Years of education: Not on file   Highest education level: Not on file  Occupational  History   Occupation: bBiomedical engineer Tobacco Use   Smoking status: Former    Types: Cigarettes    Quit date: 12/20/2001    Years since quitting: 20.1   Smokeless tobacco: Never  Vaping Use   Vaping Use: Never used  Substance and Sexual Activity   Alcohol use: No    Alcohol/week: 0.0 standard drinks of alcohol   Drug use: No   Sexual activity: Not on file  Other Topics Concern   Not on file  Social History Narrative   Not on file   Social Determinants of Health   Financial Resource Strain: Low Risk  (02/16/2022)  Overall Financial Resource Strain (CARDIA)    Difficulty of Paying Living Expenses: Not very hard  Food Insecurity: No Food Insecurity (02/16/2022)   Hunger Vital Sign    Worried About Running Out of Food in the Last Year: Never true    Ran Out of Food in the Last Year: Never true  Transportation Needs: No Transportation Needs (02/16/2022)   PRAPARE - Hydrologist (Medical): No    Lack of Transportation (Non-Medical): No  Physical Activity: Insufficiently Active (02/16/2022)   Exercise Vital Sign    Days of Exercise per Week: 3 days    Minutes of Exercise per Session: 30 min  Stress: No Stress Concern Present (02/16/2022)   [place]    Feeling of Stress : Not at all  Social Connections: Moderately Integrated (02/16/2022)   Social Connection and Isolation Panel [NHANES]    Frequency of Communication with Friends and Family: More than three times a week    Frequency of Social Gatherings with Friends and Family: More than three times a week    Attends Religious Services: More than 4 times per year    Active Member of [employer] or Organizations: Yes    Attends Music therapist: More than 4 times per year    Marital Status: Never married    Tobacco Counseling Counseling given: Not Answered   Clinical Intake:  Pre-visit preparation completed: Yes  Pain :  No/denies pain     BMI - recorded: 26.95 Nutritional Status: BMI 25 -29 Overweight Nutritional Risks: None Diabetes: Yes  How often do you need to have someone help you when you read instructions, pamphlets, or other written materials from your doctor or pharmacy?: 1 - Never  Diabetic?Nutrition Risk Assessment:  Has the patient had any N/V/D within the last 2 months?  No  Does the patient have any non-healing wounds?  No  Has the patient had any unintentional weight loss or weight gain?  No   Diabetes:  Is the patient diabetic?  No  Pre-diabetic If diabetic, was a CBG obtained today?  No  Did the patient bring in their glucometer from home?  No  How often do you monitor your CBG's? Checks A1C at office.   Financial Strains and Diabetes Management:  Are you having any financial strains with the device, your supplies or your medication? No .  Does the patient want to be seen by Chronic Care Management for management of their diabetes?  No  Would the patient like to be referred to a Nutritionist or for Diabetic Management?  No   Diabetic Exams:  Diabetic Eye Exam: Completed 2021. Overdue for diabetic eye exam. Pt has been advised about the importance in completing this exam. Diabetic Foot Exam: Completed 12/24/2020. Pt has been advised about the importance in completing this exam.   Interpreter Needed?: No  Information entered by :: mj Shakiara Lukic, lpn   Activities of Daily Living    02/16/2022   10:16 AM  In your present state of health, do you have any difficulty performing the following activities:  Hearing? 0  Vision? 0  Difficulty concentrating or making decisions? 0  Walking or climbing stairs? 0  Dressing or bathing? 0  Doing errands, shopping? 0  Preparing Food and eating ? N  Using the Toilet? N  In the past six months, have you accidently leaked urine? N  Do you have problems with loss of bowel control? N  Managing your Medications? N  Managing your Finances?  N  Housekeeping or managing your Housekeeping? N    Patient Care Team: [name], MD as PCP - General (Family Medicine) Dennard Schaumann Cammie Mcgee, MD (Family Medicine) Edythe Clarity, [facility] as Pharmacist (Pharmacist)  Indicate any recent Medical Services you may have received from other than [facility] providers in the past year (date may be approximate).     Assessment:   This is a routine wellness examination for Nyiesha.  Hearing/Vision screen Hearing Screening - Comments:: No hearing issues.  Vision Screening - Comments:: Glasses. 2021.[facility].  Dietary issues and exercise activities discussed: Current Exercise Habits: Home exercise routine, Type of exercise: walking, Time (Minutes): 30, Frequency (Times/Week): 3, Weekly Exercise (Minutes/Week): 90, Intensity: Mild, Exercise limited by: cardiac condition(s)   Goals Addressed             This Visit's Progress    Exercise 3x per week (30 min per time)         Depression Screen    02/16/2022   10:11 AM 12/24/2020   10:37 AM 10/15/2018   10:15 AM 02/27/2018   11:41 AM 11/27/2016   12:24 PM 11/13/2016   11:27 AM 11/11/2015    9:11 AM  PHQ 2/9 Scores  PHQ - 2 Score 0 0 0 0 0 0 0  PHQ- 9 Score     0 0     Fall Risk    02/16/2022   10:15 AM 12/24/2020   10:37 AM 10/15/2018   10:15 AM 02/27/2018   11:41 AM 11/27/2016   12:24 PM  Fall Risk   Falls in the past year? 0 0 1 No No  Number falls in past yr: 0  1    Injury with Fall? 0  1    Risk for fall due to : No Fall Risks No Fall Risks     Follow up Falls prevention discussed Falls evaluation completed Falls evaluation completed      [place]:  Any stairs in or around the home? Yes  If so, are there any without handrails? No  Home free of loose throw rugs in walkways, pet beds, electrical cords, etc? Yes  Adequate lighting in your home to reduce risk of falls? Yes   ASSISTIVE DEVICES UTILIZED TO PREVENT FALLS:  Life alert? Yes  Use  of a cane, walker or w/c? No  Grab bars in the bathroom? Yes  Shower chair or bench in shower? Yes  Elevated toilet seat or a handicapped toilet? Yes   TIMED UP AND GO:  Was the test performed? No .  Phone visit.   Cognitive Function:        02/16/2022   10:17 AM  6CIT Screen  What Year? 0 points  What month? 0 points  What time? 0 points  Count back from 20 0 points  Months in reverse 0 points  Repeat phrase 4 points  Total Score 4 points    Immunizations Immunization History  Administered Date(s) Administered   Fluad Quad(high Dose 65+) 05/20/2019, 07/20/2021   H1N1 08/03/2008   Influenza Whole 05/28/2008, 07/07/2011   Influenza, High Dose Seasonal PF 06/04/2017, 05/29/2018, 07/14/2020   Influenza,inj,Quad PF,6+ Mos 05/28/2013, 07/02/2014, 06/01/2015, 05/24/2016   Influenza-Unspecified 06/09/2008, 06/03/2009, 05/19/2010   Pfizer Covid-19 Vaccine Bivalent Booster 21yr & up 09/08/2021   Pneumococcal Conjugate-13 08/18/2013   Pneumococcal Polysaccharide-23 02/25/2014   Td 05/13/2007, 01/25/2012   Tdap 01/25/2012  Unspecified SARS-COV-2 Vaccination 10/20/2019, 10/31/2019, 06/28/2020   Zoster, Live 07/02/2014    TDAP status: Due, Education has been provided regarding the importance of this vaccine. Advised may receive this vaccine at local pharmacy or Health Dept. Aware to provide a copy of the vaccination record if obtained from local pharmacy or Health Dept. Verbalized acceptance and understanding.  Flu Vaccine status: Up to date  Pneumococcal vaccine status: Up to date  Covid-19 vaccine status: Completed vaccines  Qualifies for Shingles Vaccine? Yes   Zostavax completed Yes   Shingrix Completed?: No.    Education has been provided regarding the importance of this vaccine. Patient has been advised to call insurance company to determine out of pocket expense if they have not yet received this vaccine. Advised may also receive vaccine at local pharmacy or Health  Dept. Verbalized acceptance and understanding.  Screening Tests Health Maintenance  Topic Date Due   HEMOGLOBIN A1C  11/30/2021   FOOT EXAM  04/27/2022 (Originally 12/24/2021)   OPHTHALMOLOGY EXAM  04/27/2022 (Originally 07/14/2021)   TETANUS/TDAP  04/27/2022 (Originally 01/24/2022)   Zoster Vaccines- Shingrix (1 of 2) 04/27/2022 (Originally 08/21/1965)   INFLUENZA VACCINE  03/28/2022   Pneumonia Vaccine 25+ Years old  Completed   DEXA SCAN  Completed   COVID-19 Vaccine  Completed   Hepatitis C Screening  Completed   HPV VACCINES  Aged Out   COLONOSCOPY (Pts 45-68yr Insurance coverage will need to be confirmed)  Discontinued    Health Maintenance  Health Maintenance Due  Topic Date Due   HEMOGLOBIN A1C  11/30/2021    Colorectal cancer screening: No longer required.   Mammogram status: Completed 04/11/2021. Repeat every year  Bone Density status: Completed 06/18/2020. Results reflect: Bone density results: NORMAL. Repeat every 5 years.  Lung Cancer Screening: (Low Dose CT Chest recommended if Age 77-80years, 30 pack-year currently smoking OR have quit w/in 15years.) does not qualify.   Additional Screening:  Hepatitis C Screening: does qualify; Completed 05/20/2019  Vision Screening: Recommended annual ophthalmology exams for early detection of glaucoma and other disorders of the eye. Is the patient up to date with their annual eye exam?  No  Who is the provider or what is the name of the office in which the patient attends annual eye exams? [place] is not established with a provider, would they like to be referred to a provider to establish care? No .   Dental Screening: Recommended annual dental exams for proper oral hygiene  Community Resource Referral / Chronic Care Management: CRR required this visit?  No   CCM required this visit?  No      Plan:     I have personally reviewed and noted the following in the patient's chart:   Medical and social  history Use of alcohol, tobacco or illicit drugs  Current medications and supplements including opioid prescriptions.  Functional ability and status Nutritional status Physical activity Advanced directives List of other physicians Hospitalizations, surgeries, and ER visits in previous 12 months Vitals Screenings to include cognitive, depression, and falls Referrals and appointments  In addition, I have reviewed and discussed with patient certain preventive protocols, quality metrics, and best practice recommendations. A written personalized care plan for preventive services as well as general preventive health recommendations were provided to patient.     MChriss Driver LPN   [id number]  Nurse Notes: Discussed Shingrix and how to obtain. Discussed mammogram, pt would like to repeat in August. Order  placed.

## 2022-02-16 NOTE — Telephone Encounter (Signed)
Spoke with pt, pt will stop in the office 02/16/22 to drop off diabetic shoe form to be completed by Dr. Dennard Schaumann. Please call when ready for pick up

## 2022-02-16 NOTE — Patient Instructions (Signed)
Madeline Wood , Thank you for taking time to come for your Medicare Wellness Visit. I appreciate your ongoing commitment to your health goals. Please review the following plan we discussed and let me know if I can assist you in the future.   Screening recommendations/referrals: Colonoscopy: Done 06/22/2020.  Mammogram: Done 04/11/2021 Repeat annually  Bone Density: Done 06/18/2020 Repeat every 2 years  Recommended yearly ophthalmology/optometry visit for glaucoma screening and checkup Recommended yearly dental visit for hygiene and checkup  Vaccinations: Influenza vaccine: Done 07/20/2021 Repeat annually  Pneumococcal vaccine: Done 08/18/2013 and 02/25/2014 Tdap vaccine: Done 01/25/2012 Repeat in 10 years  Shingles vaccine: Done 07/02/2014.    Covid-19:Done 09/08/2021, 06/28/2020, 10/31/2019, 10/20/2019.  Advanced directives: Please bring a copy of your health care power of attorney and living will to the office to be added to your chart at your convenience.   Conditions/risks identified: Aim for 30 minutes of exercise or brisk walking, 6-8 glasses of water, and 5 servings of fruits and vegetables each day.   Next appointment: Follow up in one year for your annual wellness visit 2024.   Preventive Care 77 Years and Older, Female Preventive care refers to lifestyle choices and visits with your health care provider that can promote health and wellness. What does preventive care include? A yearly physical exam. This is also called an annual well check. Dental exams once or twice a year. Routine eye exams. Ask your health care provider how often you should have your eyes checked. Personal lifestyle choices, including: Daily care of your teeth and gums. Regular physical activity. Eating a healthy diet. Avoiding tobacco and drug use. Limiting alcohol use. Practicing safe sex. Taking low-dose aspirin every day. Taking vitamin and mineral supplements as recommended by your health care  provider. What happens during an annual well check? The services and screenings done by your health care provider during your annual well check will depend on your age, overall health, lifestyle risk factors, and family history of disease. Counseling  Your health care provider may ask you questions about your: Alcohol use. Tobacco use. Drug use. Emotional well-being. Home and relationship well-being. Sexual activity. Eating habits. History of falls. Memory and ability to understand (cognition). Work and work Statistician. Reproductive health. Screening  You may have the following tests or measurements: Height, weight, and BMI. Blood pressure. Lipid and cholesterol levels. These may be checked every 5 years, or more frequently if you are over 33 years old. Skin check. Lung cancer screening. You may have this screening every year starting at age 77 if you have a 30-pack-year history of smoking and currently smoke or have quit within the past 15 years. Fecal occult blood test (FOBT) of the stool. You may have this test every year starting at age 77. Flexible sigmoidoscopy or colonoscopy. You may have a sigmoidoscopy every 5 years or a colonoscopy every 10 years starting at age 77. Hepatitis C blood test. Hepatitis B blood test. Sexually transmitted disease (STD) testing. Diabetes screening. This is done by checking your blood sugar (glucose) after you have not eaten for a while (fasting). You may have this done every 1-3 years. Bone density scan. This is done to screen for osteoporosis. You may have this done starting at age 77. Mammogram. This may be done every 1-2 years. Talk to your health care provider about how often you should have regular mammograms. Talk with your health care provider about your test results, treatment options, and if necessary, the need for more tests. Vaccines  Your health care provider may recommend certain vaccines, such as: Influenza vaccine. This is  recommended every year. Tetanus, diphtheria, and acellular pertussis (Tdap, Td) vaccine. You may need a Td booster every 10 years. Zoster vaccine. You may need this after age 77. Pneumococcal 13-valent conjugate (PCV13) vaccine. One dose is recommended after age 77. Pneumococcal polysaccharide (PPSV23) vaccine. One dose is recommended after age 77. Talk to your health care provider about which screenings and vaccines you need and how often you need them. This information is not intended to replace advice given to you by your health care provider. Make sure you discuss any questions you have with your health care provider. Document Released: 09/10/2015 Document Revised: 05/03/2016 Document Reviewed: 06/15/2015 Elsevier Interactive Patient Education  2017 [place] Prevention in the Home Falls can cause injuries. They can happen to people of all ages. There are many things you can do to make your home safe and to help prevent falls. What can I do on the outside of my home? Regularly fix the edges of walkways and driveways and fix any cracks. Remove anything that might make you trip as you walk through a door, such as a raised step or threshold. Trim any bushes or trees on the path to your home. Use bright outdoor lighting. Clear any walking paths of anything that might make someone trip, such as rocks or tools. Regularly check to see if handrails are loose or broken. Make sure that both sides of any steps have handrails. Any raised decks and porches should have guardrails on the edges. Have any leaves, snow, or ice cleared regularly. Use sand or salt on walking paths during winter. Clean up any spills in your garage right away. This includes oil or grease spills. What can I do in the bathroom? Use night lights. Install grab bars by the toilet and in the tub and shower. Do not use towel bars as grab bars. Use non-skid mats or decals in the tub or shower. If you need to sit down in  the shower, use a plastic, non-slip stool. Keep the floor dry. Clean up any water that spills on the floor as soon as it happens. Remove soap buildup in the tub or shower regularly. Attach bath mats securely with double-sided non-slip rug tape. Do not have throw rugs and other things on the floor that can make you trip. What can I do in the bedroom? Use night lights. Make sure that you have a light by your bed that is easy to reach. Do not use any sheets or blankets that are too big for your bed. They should not hang down onto the floor. Have a firm chair that has side arms. You can use this for support while you get dressed. Do not have throw rugs and other things on the floor that can make you trip. What can I do in the kitchen? Clean up any spills right away. Avoid walking on wet floors. Keep items that you use a lot in easy-to-reach places. If you need to reach something above you, use a strong step stool that has a grab bar. Keep electrical cords out of the way. Do not use floor polish or wax that makes floors slippery. If you must use wax, use non-skid floor wax. Do not have throw rugs and other things on the floor that can make you trip. What can I do with my stairs? Do not leave any items on the stairs. Make sure that there are  handrails on both sides of the stairs and use them. Fix handrails that are broken or loose. Make sure that handrails are as long as the stairways. Check any carpeting to make sure that it is firmly attached to the stairs. Fix any carpet that is loose or worn. Avoid having throw rugs at the top or bottom of the stairs. If you do have throw rugs, attach them to the floor with carpet tape. Make sure that you have a light switch at the top of the stairs and the bottom of the stairs. If you do not have them, ask someone to add them for you. What else can I do to help prevent falls? Wear shoes that: Do not have high heels. Have rubber bottoms. Are comfortable  and fit you well. Are closed at the toe. Do not wear sandals. If you use a stepladder: Make sure that it is fully opened. Do not climb a closed stepladder. Make sure that both sides of the stepladder are locked into place. Ask someone to hold it for you, if possible. Clearly mark and make sure that you can see: Any grab bars or handrails. First and last steps. Where the edge of each step is. Use tools that help you move around (mobility aids) if they are needed. These include: Canes. Walkers. Scooters. Crutches. Turn on the lights when you go into a dark area. Replace any light bulbs as soon as they burn out. Set up your furniture so you have a clear path. Avoid moving your furniture around. If any of your floors are uneven, fix them. If there are any pets around you, be aware of where they are. Review your medicines with your doctor. Some medicines can make you feel dizzy. This can increase your chance of falling. Ask your doctor what other things that you can do to help prevent falls. This information is not intended to replace advice given to you by your health care provider. Make sure you discuss any questions you have with your health care provider. Document Released: 06/10/2009 Document Revised: 01/20/2016 Document Reviewed: 09/18/2014 Elsevier Interactive Patient Education  2017 [employer].

## 2022-02-17 NOTE — Telephone Encounter (Signed)
Put form in dr. Caren Macadam folder to complete 02/17/22

## 2022-02-21 ENCOUNTER — Encounter: Payer: Self-pay | Admitting: Podiatry

## 2022-02-21 ENCOUNTER — Ambulatory Visit (INDEPENDENT_AMBULATORY_CARE_PROVIDER_SITE_OTHER): Payer: 59

## 2022-02-21 ENCOUNTER — Ambulatory Visit (INDEPENDENT_AMBULATORY_CARE_PROVIDER_SITE_OTHER): Payer: 59 | Admitting: Podiatry

## 2022-02-21 DIAGNOSIS — M79675 Pain in left toe(s): Secondary | ICD-10-CM

## 2022-02-21 DIAGNOSIS — B351 Tinea unguium: Secondary | ICD-10-CM

## 2022-02-21 DIAGNOSIS — J309 Allergic rhinitis, unspecified: Secondary | ICD-10-CM | POA: Diagnosis not present

## 2022-02-21 DIAGNOSIS — I739 Peripheral vascular disease, unspecified: Secondary | ICD-10-CM | POA: Diagnosis not present

## 2022-02-21 DIAGNOSIS — E119 Type 2 diabetes mellitus without complications: Secondary | ICD-10-CM | POA: Diagnosis not present

## 2022-02-21 DIAGNOSIS — M79674 Pain in right toe(s): Secondary | ICD-10-CM

## 2022-02-28 NOTE — Progress Notes (Signed)
  Subjective:  Patient ID: Madeline Wood, female    DOB: Oct 18, 1945,  MRN: [id number]  Madeline Wood presents to clinic today for at risk foot care. Pt has h/o NIDDM with PAD  Patient is a former patient of Dr. Leeanne Rio. She states she has brought in diabetic shoe paperwork signed by her PCP.  New problem(s): None.   PCP is [name], MD , and last visit was Dec 26, 2021.  Allergies  Allergen Reactions   Ciprofloxacin    Citalopram Hydrobromide    Escitalopram Oxalate    Paroxetine    Polysporin [Bacitracin-Polymyxin B]     Review of Systems: Negative except as noted in the HPI.  Objective: No changes noted in today's physical examination. Madeline Wood is a pleasant 76 y.o. female, WD, WN in NAD. AAO x 3.  Vascular Examination: CFT <4 seconds b/l. DP pulses faintly palpable b/l. PT pulses faintly palpable b/l. Digital hair sparse b/l. Skin temperature gradient warm to warm b/l. No ischemia or gangrene. No cyanosis or clubbing noted b/l. No edema noted b/l LE.   Neurological Examination: Sensation grossly intact b/l with 10 gram monofilament. Vibratory sensation intact b/l.   Dermatological Examination: Pedal skin thin, shiny and atrophic b/l. Toenails 1-5 b/l thick, discolored, elongated with subungual debris and pain on dorsal palpation.  No open wounds b/l LE. No interdigital macerations noted b/l LE. Minimal hyperkeratosis noted bilateral great toes.  No erythema, no edema, no drainage, no fluctuance.  Musculoskeletal Examination: Muscle strength 5/5 to b/l LE. HAV with bunion bilaterally and hammertoes 2-5 b/l. Pes planus deformity noted bilateral LE.  Radiographs: None  Last A1c:      Latest Ref Rng & Units 06/01/2021    8:53 AM  Hemoglobin A1C  Hemoglobin-A1c <5.7 % of total Hgb 5.8     Assessment/Plan: 1. Pain due to onychomycosis of toenails of both feet   2. PVD (peripheral vascular disease) ([place])   3. Diabetes mellitus without complication ([place])      -Patient was evaluated and treated. All patient's and/or POA's questions/concerns answered on today's visit. -Patient did bring in paperwork signed by PCP which states she is prediabetic. She had this signed last year for dx of diabetes. I informed her I would pass this on to Orthotics and Prosthetics Department for review as I am not sure prediabetes qualifies her for shoes. She related understanding. She will be given further guidance by Orthotics and Prosthetics Department. She was appreciative and related understanding . -Toenails 1-5 b/l were debrided in length and girth with sterile nail nippers and dremel without iatrogenic bleeding.  -As a courtesy, callus(es) bilateral great toes gently filed without complication or incident. Total number pared=2. -Patient/POA to call should there be question/concern in the interim.   Return in about 3 months (around 05/24/2022).  Marzetta Board, [name]

## 2022-03-02 ENCOUNTER — Other Ambulatory Visit: Payer: Self-pay | Admitting: Allergy

## 2022-03-06 ENCOUNTER — Other Ambulatory Visit: Payer: Self-pay | Admitting: Internal Medicine

## 2022-03-06 ENCOUNTER — Other Ambulatory Visit: Payer: Self-pay | Admitting: Family Medicine

## 2022-03-07 NOTE — Telephone Encounter (Signed)
Requested medication (s) are due for refill today: unclear, no date  Requested medication (s) are on the active medication list: yes  Last refill:  no date on historical provider rx  Future visit scheduled: no, just seen 12/26/21  Notes to clinic:  This med is not delegated and written by a historical provider, please assess.      Requested Prescriptions  Pending Prescriptions Disp Refills   ALPRAZolam (XANAX) 1 MG tablet [Pharmacy Med Name: ALPRAZOLAM 1 MG TABLET] 60 tablet     Sig: TAKE 1 TABLET BY MOUTH TWICE A DAY     Not Delegated - Psychiatry: Anxiolytics/Hypnotics 2 Failed - 03/06/2022  3:51 PM      Failed - This refill cannot be delegated      Failed - Urine Drug Screen completed in last 360 days      Passed - Patient is not pregnant      Passed - Valid encounter within last 6 months    Recent Outpatient Visits           2 months ago Benign essential HTN   [place] Pickard, Cammie Mcgee, MD   2 months ago Essential hypertension   [place], Cammie Mcgee, MD   8 months ago Exacerbation of gout   [place] [facility], Cammie Mcgee, MD   9 months ago Essential hypertension   [place] Pick[facility], Cammie Mcgee, MD   1 year ago Essential hypertension   [place] [facility], Cammie Mcgee, MD

## 2022-03-14 ENCOUNTER — Ambulatory Visit: Payer: Self-pay

## 2022-03-14 NOTE — Telephone Encounter (Signed)
  Chief Complaint: Dizziness Symptoms: mild dizziness upon getting up or standing quickly Frequency: yesterday Pertinent Negatives: Patient denies Fever, one sided weakness, HA, spinning room vomiting fever Disposition: '[]'$ ED /'[]'$ Urgent Care (no appt availability in office) / '[]'$ Appointment(In office/virtual)/ '[]'$  [facility]/ '[]'$ Home Care/ '[x]'$ Refused Recommended Disposition /'[]'$ [facility]/ '[]'$  Follow-up with PCP Additional Notes: PT would like to be seen in the office. First available is Monday. PT given s/s to watch for. Pt will seek immediate help if needed. Pt states she has been under a lot of stress recently and feels this may be contributing. PT will get up slowly and dangle feet prior to rising. After standing she will wait a moment ot 2 before proceeding. Pt took BP while on the phone 136/70 HR was 77. Reason for Disposition  [1] MODERATE dizziness (e.g., interferes with normal activities) AND [2] has NOT been evaluated by doctor (or NP/PA) for this  (Exception: Dizziness caused by heat exposure, sudden standing, or poor fluid intake.)  Answer Assessment - Initial Assessment Questions 1. DESCRIPTION: "Describe your dizziness."     Has had vertigo in the past but this is less 2. LIGHTHEADED: "Do you feel lightheaded?" (e.g., somewhat faint, woozy, weak upon standing)     when you get up - dizzy 3. VERTIGO: "Do you feel like either you or the room is spinning or tilting?" (i.e. vertigo)     No spinning 4. SEVERITY: "How bad is it?"  "Do you feel like you are going to faint?" "Can you stand and walk?"   - MILD: Feels slightly dizzy, but walking normally.   - MODERATE: Feels unsteady when walking, but not falling; interferes with normal activities (e.g., school, work).   - SEVERE: Unable to walk without falling, or requires assistance to walk without falling; feels like passing out now.      mild 5. ONSET:  "When did the dizziness begin?"     yesterday 6. AGGRAVATING  FACTORS: "Does anything make it worse?" (e.g., standing, change in head position)     Lying down feels better 7. HEART RATE: "Can you tell me your heart rate?" "How many beats in 15 seconds?"  (Note: not all patients can do this)       *No Answer* 8. CAUSE: "What do you think is causing the dizziness?"     stress 9. RECURRENT SYMPTOM: "Have you had dizziness before?" If Yes, ask: "When was the last time?" "What happened that time?"     Many years ago 10. OTHER SYMPTOMS: "Do you have any other symptoms?" (e.g., fever, chest pain, vomiting, diarrhea, bleeding)       no 11. PREGNANCY: "Is there any chance you are pregnant?" "When was your last menstrual period?"       no  Protocols used: Dizziness - Lightheadedness-A-AH

## 2022-03-16 ENCOUNTER — Other Ambulatory Visit: Payer: Self-pay | Admitting: Family Medicine

## 2022-03-16 ENCOUNTER — Other Ambulatory Visit: Payer: Self-pay | Admitting: Internal Medicine

## 2022-03-20 ENCOUNTER — Ambulatory Visit (INDEPENDENT_AMBULATORY_CARE_PROVIDER_SITE_OTHER): Payer: 59 | Admitting: Family Medicine

## 2022-03-20 VITALS — BP 150/72 | HR 68 | Temp 98.3°F | Ht 66.0 in | Wt 170.0 lb

## 2022-03-20 DIAGNOSIS — I10 Essential (primary) hypertension: Secondary | ICD-10-CM

## 2022-03-20 NOTE — Progress Notes (Signed)
Subjective:    Patient ID: Madeline Wood, female    DOB: Nov 03, 1945, 76 y.o.   MRN: [id number]  Patient reports vertigo-like dizziness with standing up and turning her head.  This is been going on for 1 and half weeks.  Is been gradually getting better.  Review of her medication list though reports redundant medications.  States that she is taking metoprolol and hydrochlorothiazide along with a combination product of metoprolol and hydrochlorothiazide.  She is uncertain if she is taking all of these medications.  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    Angio-edema    Anxiety    Arthritis    Blood transfusion without reported diagnosis    1985 with hysterectomy    CAD (coronary artery disease)    stent of distal RCA in 2006   Diabetes mellitus without complication ([place])    diet controlled-    Diverticulosis    GERD (gastroesophageal reflux disease)    Gout    H/O heart artery stent    Hiatal hernia    History of nuclear stress test 03/2011   negative lexiscan myoview; normal perfusion   Hyperlipidemia    Hypertension    Knee pain    Neuromuscular disorder ([place])    Hiatal Hernia    Obesity    Positive TB test    Venous insufficiency    Past Surgical History:  Procedure Laterality Date   ABDOMINAL HYSTERECTOMY     BACK SURGERY  2012   BREAST LUMPECTOMY     COLONOSCOPY  2011   CORONARY ANGIOPLASTY WITH STENT PLACEMENT  06/29/2005   [place] 2.5x1m DES to distal RCA (Dr. MTami Ribas   TRANSTHORACIC ECHOCARDIOGRAM  12/20/2012   EF 558-09% normal systolic function, mild hypokinesis of inf myocardium; calcified MV annulua, LA & RA mildly dilated   Current Outpatient Medications on File Prior to Visit  Medication Sig Dispense Refill   albuterol (VENTOLIN HFA) 108 (90 Base) MCG/ACT inhaler TAKE 2 PUFFS INTO LUNGS EVERY 6 HOURS AS  NEEDED FOR WHEEZE OR SHORTNESS OF BREATH 8.5 each 3   ALPRAZolam (XANAX) 1 MG tablet TAKE 1 TABLET BY MOUTH TWICE A DAY 60 tablet 2   amLODipine (NORVASC) 5 MG tablet Take 1 tablet (5 mg total) by mouth daily. 90 tablet 1   aspirin 81 MG tablet Take 81 mg by mouth daily.     atorvastatin (LIPITOR) 80 MG tablet TAKE 1 TABLET BY MOUTH EVERY DAY 90 tablet 2   colchicine 0.6 MG tablet PLEASE SEE ATTACHED FOR DETAILED DIRECTIONS     cyclobenzaprine (FLEXERIL) 5 MG tablet TAKE 1 TABLET BY MOUTH THREE TIMES A DAY AS NEEDED FOR MUSCLE SPASMS 20 tablet 0   EPINEPHrine (EPIPEN 2-PAK) 0.3 mg/0.3 mL IJ SOAJ injection Use as directed for life threatening allergic reactions 1 each 3   fexofenadine (ALLEGRA) 180 MG tablet Take 1 tablet (180 mg total) by mouth daily. 30 tablet 5   fish oil-omega-3 fatty acids 1000 MG capsule Take 1 g by mouth. Takes occasionally     fluticasone (FLONASE) 50 MCG/ACT nasal spray PLACE 1-2 SPRAYS INTO BOTH NOSTRILS DAILY AS NEEDED FOR ALLERGIES OR RHINITIS. 48 mL 1   hydrochlorothiazide (HYDRODIURIL) 25 MG tablet Take 1 tablet (25 mg total) by mouth daily. 90 tablet 3   losartan (COZAAR) 100 MG tablet  Take 1 tablet (100 mg total) by mouth daily. 90 tablet 3   losartan (COZAAR) 25 MG tablet TAKE 2 TABLETS ('50MG'$ ) BY MOUTH EVERY DAY     metoprolol tartrate (LOPRESSOR) 50 MG tablet TAKE 1 TABLET BY MOUTH EVERY MORNING AND 1 AND 1/2 TABLET EVERY EVENING 225 tablet 1   Metoprolol-Hydrochlorothiazide 100-12.5 MG TB24 Take 1 tablet every day by oral route.     Multiple Vitamin (MULTIVITAMIN) tablet Take 1 tablet by mouth. occasionally     nitroGLYCERIN (NITROSTAT) 0.4 MG SL tablet PLACE 1 TABLET UNDER THE TONGUE EVERY 5 MINUTES AS NEEDED FOR CHEST PAIN. 25 tablet 2   pantoprazole (PROTONIX) 40 MG tablet Take 1 tablet by mouth daily.     Wheat Dextrin (BENEFIBER PO) Take by mouth. Daily     zolpidem (AMBIEN) 10 MG tablet TAKE 1 TABLET BY MOUTH EVERY DAY AT BEDTIME AS NEEDED FOR SLEEP 30  tablet 3   [DISCONTINUED] omeprazole (PRILOSEC OTC) 20 MG tablet Take 1 tablet (20 mg total) by mouth daily. 30 tablet 1   No current facility-administered medications on file prior to visit.   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  Tobacco Use   Smoking status: Former    Types: Cigarettes    Quit date: 12/20/2001    Years since quitting: 20.2   Smokeless tobacco: Never  Vaping Use   Vaping Use: Never used  Substance and Sexual Activity   Alcohol use: No    Alcohol/week: 0.0 standard drinks of alcohol   Drug use: No   Sexual activity: Not on file  Other Topics Concern   Not on file  Social History Narrative   Not on file   Social Determinants of Health   Financial Resource Strain: Low Risk  (02/16/2022)   Overall Financial Resource Strain (CARDIA)    Difficulty of Paying Living Expenses: Not very hard  Food Insecurity: No Food Insecurity (02/16/2022)   Hunger Vital Sign    Worried About Running Out of Food in the Last Year: Never true    Ran Out of Food in the Last Year: Never true  Transportation Needs: No Transportation Needs (02/16/2022)   PRAPARE - Transportation    Lack of Transportation (Medical): No    Lack of Transportation (Non-Medical): No  Physical Activity: Insufficiently Active (02/16/2022)   Exercise Vital Sign    Days of Exercise per Week: 3 days    Minutes of Exercise per Session: 30 min  Stress: No Stress Concern Present (02/16/2022)   [place]    Feeling of Stress : Not at all  Social Connections: Moderately Integrated (02/16/2022)    Social Connection and Isolation Panel [NHANES]    Frequency of Communication with Friends and Family: More than three times a week    Frequency of Social Gatherings with Friends and Family: More than three times a week    Attends Religious Services: More than 4 times per year    Active Member of [employer] or Organizations: Yes    Attends Archivist Meetings:  More than 4 times per year    Marital Status: Never married  Intimate Partner Violence: Not At Risk (02/16/2022)   Humiliation, Afraid, Rape, and Kick questionnaire    Fear of Current or Ex-Partner: No    Emotionally Abused: No    Physically Abused: No    Sexually Abused: No     Review of Systems  All other systems reviewed and are negative.      Objective:   Physical Exam Constitutional:      General: She is not in acute distress.    Appearance: Normal appearance. She is not ill-appearing, toxic-appearing or diaphoretic.  Cardiovascular:     Rate and Rhythm: Normal rate and regular rhythm.     Pulses: Normal pulses.     Heart sounds: Normal heart sounds. No murmur heard.    No friction rub. No gallop.  Pulmonary:     Effort: Pulmonary effort is normal. No respiratory distress.     Breath sounds: Normal breath sounds. No stridor. No wheezing, rhonchi or rales.  Abdominal:     General: Bowel sounds are normal. There is no distension.     Palpations: Abdomen is soft.     Tenderness: There is no abdominal tenderness. There is no rebound.  Musculoskeletal:     Right lower leg: No edema.     Left lower leg: No edema.  Neurological:     Mental Status: She is alert.    Wt Readings from Last 3 Encounters:  03/20/22 170 lb (77.1 kg)  02/16/22 167 lb (75.8 kg)  12/26/21 177 lb 3.2 oz (80.4 kg)          Assessment & Plan:  Essential hypertension I believe the patient has had vertigo.  However some of this could be relative hypotension.  I asked the patient to go home and verify her medication list and make sure  that she is not taking metoprolol, hydrochlorothiazide, and metoprolol and hydrochlorothiazide.  I explained that this would be redundant and could lead to hypotension.  If her systolic blood pressure is less than 110 we need to reduce her medication.  The vertigo has resolved already.

## 2022-03-22 ENCOUNTER — Other Ambulatory Visit: Payer: Self-pay

## 2022-03-22 ENCOUNTER — Telehealth: Payer: Self-pay

## 2022-03-22 NOTE — Telephone Encounter (Signed)
Pt called, stated that she is not taking the combo medication of Metprolol-hydrochlorothiazide.   Per pt said is taking them separately.   Pt stated her b/p this morning 03/22/22 was 110/57.

## 2022-03-23 ENCOUNTER — Other Ambulatory Visit: Payer: Self-pay

## 2022-03-23 ENCOUNTER — Other Ambulatory Visit: Payer: 59

## 2022-03-23 DIAGNOSIS — R945 Abnormal results of liver function studies: Secondary | ICD-10-CM

## 2022-03-23 DIAGNOSIS — E782 Mixed hyperlipidemia: Secondary | ICD-10-CM

## 2022-03-23 DIAGNOSIS — E119 Type 2 diabetes mellitus without complications: Secondary | ICD-10-CM

## 2022-03-23 DIAGNOSIS — I10 Essential (primary) hypertension: Secondary | ICD-10-CM

## 2022-03-23 DIAGNOSIS — Z1231 Encounter for screening mammogram for malignant neoplasm of breast: Secondary | ICD-10-CM

## 2022-03-24 LAB — HEMOGLOBIN A1C
Hgb A1c MFr Bld: 5.9 % of total Hgb — ABNORMAL HIGH (ref <5.7)
Mean Plasma Glucose: 123 mg/dL
eAG (mmol/L): 6.8 mmol/L

## 2022-03-24 LAB — LIPID PANEL
Cholesterol: 166 mg/dL (ref <200)
HDL: 66 mg/dL (ref >=50)
LDL Cholesterol (Calc): 86 mg/dL (calc)
Non-HDL Cholesterol (Calc): 100 mg/dL (calc) (ref <130)
Total CHOL/HDL Ratio: 2.5 (calc) (ref <5.0)
Triglycerides: 49 mg/dL (ref <150)

## 2022-03-24 LAB — BASIC METABOLIC PANEL
BUN/Creatinine Ratio: 21 (calc) (ref 6–22)
BUN: 12 mg/dL (ref 7–25)
CO2: 30 mmol/L (ref 20–32)
Calcium: 9.8 mg/dL (ref 8.6–10.4)
Chloride: 85 mmol/L — ABNORMAL LOW (ref 98–110)
Creat: 0.57 mg/dL — ABNORMAL LOW (ref 0.60–1.00)
Glucose, Bld: 99 mg/dL (ref 65–99)
Potassium: 3.9 mmol/L (ref 3.5–5.3)
Sodium: 125 mmol/L — ABNORMAL LOW (ref 135–146)

## 2022-03-31 ENCOUNTER — Ambulatory Visit (INDEPENDENT_AMBULATORY_CARE_PROVIDER_SITE_OTHER): Payer: 59 | Admitting: Family Medicine

## 2022-03-31 ENCOUNTER — Ambulatory Visit (INDEPENDENT_AMBULATORY_CARE_PROVIDER_SITE_OTHER): Payer: 59

## 2022-03-31 ENCOUNTER — Other Ambulatory Visit: Payer: 59

## 2022-03-31 VITALS — BP 140/64 | HR 76 | Temp 97.8°F | Ht 66.0 in | Wt 173.8 lb

## 2022-03-31 DIAGNOSIS — E222 Syndrome of inappropriate secretion of antidiuretic hormone: Secondary | ICD-10-CM | POA: Diagnosis not present

## 2022-03-31 DIAGNOSIS — J309 Allergic rhinitis, unspecified: Secondary | ICD-10-CM | POA: Diagnosis not present

## 2022-03-31 NOTE — Progress Notes (Signed)
Subjective:    Patient ID: Madeline Wood, female    DOB: 07-27-1946, 76 y.o.   MRN: [id number]  Please see my last office visit, the patient was experiencing dizziness.  Her sodium level was found to be 125.  Therefore I recommended that she drink less than 40 ounces of water a day and hold hydrochlorothiazide.  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    Angio-edema    Anxiety    Arthritis    Blood transfusion without reported diagnosis    1985 with hysterectomy    CAD (coronary artery disease)    stent of distal RCA in 2006   Diabetes mellitus without complication ([place])    diet controlled-    Diverticulosis    GERD (gastroesophageal reflux disease)    Gout    H/O heart artery stent    Hiatal hernia    History of nuclear stress test 03/2011   negative lexiscan myoview; normal perfusion   Hyperlipidemia    Hypertension    Knee pain    Neuromuscular disorder ([place])    Hiatal Hernia    Obesity    Positive TB test    Venous insufficiency    Past Surgical History:  Procedure Laterality Date   ABDOMINAL HYSTERECTOMY     BACK SURGERY  2012   BREAST LUMPECTOMY     COLONOSCOPY  2011   CORONARY ANGIOPLASTY WITH STENT PLACEMENT  06/29/2005   Taxus 2.5x3m DES to distal RCA (Dr. MTami Ribas   TRANSTHORACIC ECHOCARDIOGRAM  12/20/2012   EF 557-32% normal systolic function, mild hypokinesis of inf myocardium; calcified MV annulua, LA & RA mildly dilated   Current Outpatient Medications on File Prior to Visit  Medication Sig Dispense Refill   albuterol (VENTOLIN HFA) 108 (90 Base) MCG/ACT inhaler TAKE 2 PUFFS INTO LUNGS EVERY 6 HOURS AS NEEDED FOR WHEEZE OR SHORTNESS OF BREATH 8.5 each 3   ALPRAZolam (XANAX) 1 MG tablet TAKE 1 TABLET BY MOUTH TWICE A DAY 60 tablet 2   amLODipine (NORVASC) 5 MG tablet Take 1 tablet (5 mg total) by mouth daily. 90 tablet 1    aspirin 81 MG tablet Take 81 mg by mouth daily.     atorvastatin (LIPITOR) 80 MG tablet TAKE 1 TABLET BY MOUTH EVERY DAY 90 tablet 2   colchicine 0.6 MG tablet PLEASE SEE ATTACHED FOR DETAILED DIRECTIONS     cyclobenzaprine (FLEXERIL) 5 MG tablet TAKE 1 TABLET BY MOUTH THREE TIMES A DAY AS NEEDED FOR MUSCLE SPASMS 20 tablet 0   EPINEPHrine (EPIPEN 2-PAK) 0.3 mg/0.3 mL IJ SOAJ injection Use as directed for life threatening allergic reactions 1 each 3   fexofenadine (ALLEGRA) 180 MG tablet Take 1 tablet (180 mg total) by mouth daily. 30 tablet 5   fish oil-omega-3 fatty acids 1000 MG capsule Take 1 g by mouth. Takes occasionally     fluticasone (FLONASE) 50 MCG/ACT nasal spray PLACE 1-2 SPRAYS INTO BOTH NOSTRILS DAILY AS NEEDED FOR ALLERGIES OR RHINITIS. 48 mL 1   hydrochlorothiazide (HYDRODIURIL) 25 MG tablet Take 1 tablet (25 mg total) by mouth daily. 90 tablet 3   losartan (COZAAR) 100 MG tablet Take 1 tablet (100 mg total) by mouth daily. 90 tablet 3   metoprolol tartrate (LOPRESSOR) 50 MG tablet TAKE 1 TABLET BY MOUTH EVERY MORNING AND 1 AND 1/2 TABLET EVERY EVENING  225 tablet 1   Multiple Vitamin (MULTIVITAMIN) tablet Take 1 tablet by mouth. occasionally     nitroGLYCERIN (NITROSTAT) 0.4 MG SL tablet PLACE 1 TABLET UNDER THE TONGUE EVERY 5 MINUTES AS NEEDED FOR CHEST PAIN. 25 tablet 2   pantoprazole (PROTONIX) 40 MG tablet Take 1 tablet by mouth daily.     Wheat Dextrin (BENEFIBER PO) Take by mouth. Daily     zolpidem (AMBIEN) 10 MG tablet TAKE 1 TABLET BY MOUTH EVERY DAY AT BEDTIME AS NEEDED FOR SLEEP 30 tablet 3   [DISCONTINUED] omeprazole (PRILOSEC OTC) 20 MG tablet Take 1 tablet (20 mg total) by mouth daily. 30 tablet 1   No current facility-administered medications on file prior to visit.   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  Tobacco Use   Smoking status: Former    Types: Cigarettes    Quit date: 12/20/2001    Years since quitting: 20.2   Smokeless tobacco: Never  Vaping Use   Vaping Use: Never used  Substance and Sexual Activity   Alcohol use: No    Alcohol/week: 0.0 standard drinks of alcohol   Drug use: No   Sexual activity: Not on file  Other Topics Concern   Not on file  Social History Narrative   Not on file   Social Determinants of Health   Financial Resource Strain: Low Risk  (02/16/2022)   Overall Financial Resource Strain (CARDIA)    Difficulty of Paying Living Expenses: Not very hard  Food Insecurity: No Food Insecurity (02/16/2022)   Hunger Vital Sign    Worried About Running Out of Food in the Last Year: Never true    Ran Out of Food in the Last Year: Never true  Transportation Needs: No Transportation Needs (02/16/2022)   PRAPARE - Transportation    Lack of Transportation (Medical): No    Lack of Transportation (Non-Medical): No  Physical Activity: Insufficiently Active (02/16/2022)   Exercise Vital Sign    Days of Exercise per Week: 3 days    Minutes of Exercise per Session: 30 min  Stress: No Stress Concern Present (02/16/2022)   [place]    Feeling of Stress : Not at all  Social Connections: Moderately Integrated (02/16/2022)   Social Connection and Isolation Panel [NHANES]    Frequency of Communication with Friends and Family: More than three times a week    Frequency of Social Gatherings with Friends and Family: More than three times a week    Attends Religious Services: More than 4 times per year    Active Member of [employer] or Organizations: Yes    Attends Archivist Meetings: More than 4 times  per year    Marital Status: Never married  Intimate Partner Violence: Not At Risk (02/16/2022)   Humiliation, Afraid, Rape, and Kick questionnaire    Fear of Current or Ex-Partner: No    Emotionally Abused: No    Physically Abused: No    Sexually Abused: No     Review of Systems  All other systems  reviewed and are negative.      Objective:   Physical Exam Constitutional:      General: She is not in acute distress.    Appearance: Normal appearance. She is not ill-appearing, toxic-appearing or diaphoretic.  Cardiovascular:     Rate and Rhythm: Normal rate and regular rhythm.     Pulses: Normal pulses.     Heart sounds: Normal heart sounds. No murmur heard.    No friction rub. No gallop.  Pulmonary:     Effort: Pulmonary effort is normal. No respiratory distress.     Breath sounds: Normal breath sounds. No stridor. No wheezing, rhonchi or rales.  Abdominal:     General: Bowel sounds are normal. There is no distension.     Palpations: Abdomen is soft.     Tenderness: There is no abdominal tenderness. There is no rebound.  Musculoskeletal:     Right lower leg: No edema.     Left lower leg: No edema.  Neurological:     Mental Status: She is alert.    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  I believe that she was exacerbating this by drinking excessive amounts of water and possibly also due to the hydrochlorothiazide.  Therefore I recommended that she stop hydrochlorothiazide.  I recommended that she try to drink less than 40 ounces a day.  Monitor sodium level today.

## 2022-04-01 LAB — BASIC METABOLIC PANEL WITH GFR
BUN: 10 mg/dL (ref 7–25)
CO2: 28 mmol/L (ref 20–32)
Calcium: 10.1 mg/dL (ref 8.6–10.4)
Chloride: 99 mmol/L (ref 98–110)
Creat: 0.63 mg/dL (ref 0.60–1.00)
Glucose, Bld: 83 mg/dL (ref 65–99)
Potassium: 4.5 mmol/L (ref 3.5–5.3)
Sodium: 137 mmol/L (ref 135–146)
eGFR: 92 mL/min/{1.73_m2} (ref >=60)

## 2022-04-07 ENCOUNTER — Ambulatory Visit (INDEPENDENT_AMBULATORY_CARE_PROVIDER_SITE_OTHER): Payer: 59

## 2022-04-07 DIAGNOSIS — J309 Allergic rhinitis, unspecified: Secondary | ICD-10-CM | POA: Diagnosis not present

## 2022-04-21 ENCOUNTER — Other Ambulatory Visit: Payer: Self-pay | Admitting: Family Medicine

## 2022-04-24 NOTE — Telephone Encounter (Signed)
Requested Prescriptions  Pending Prescriptions Disp Refills  . amLODipine (NORVASC) 5 MG tablet [Pharmacy Med Name: AMLODIPINE BESYLATE 5 MG TAB] 90 tablet 1    Sig: TAKE 1 TABLET (5 MG TOTAL) BY MOUTH DAILY.     Cardiovascular: Calcium Channel Blockers 2 Failed - 04/21/2022  5:50 PM      Failed - Last BP in normal range    BP Readings from Last 1 Encounters:  03/31/22 (!) 140/64         Passed - Last Heart Rate in normal range    Pulse Readings from Last 1 Encounters:  03/31/22 76         Passed - Valid encounter within last 6 months    Recent Outpatient Visits          3 months ago Benign essential HTN   [place] [facility], Cammie Mcgee, MD   4 months ago Essential hypertension   [place], Cammie Mcgee, MD   10 months ago Exacerbation of gout   [place] [facility], Cammie Mcgee, MD   10 months ago Essential hypertension   [place] [facility], Cammie Mcgee, MD   1 year ago Essential hypertension   [place] [facility], Cammie Mcgee, MD

## 2022-04-26 ENCOUNTER — Ambulatory Visit (INDEPENDENT_AMBULATORY_CARE_PROVIDER_SITE_OTHER): Payer: 59 | Admitting: *Deleted

## 2022-04-26 DIAGNOSIS — J309 Allergic rhinitis, unspecified: Secondary | ICD-10-CM

## 2022-05-04 LAB — HM MAMMOGRAPHY

## 2022-05-08 ENCOUNTER — Encounter: Payer: Self-pay | Admitting: Family Medicine

## 2022-05-31 ENCOUNTER — Ambulatory Visit (INDEPENDENT_AMBULATORY_CARE_PROVIDER_SITE_OTHER): Payer: 59

## 2022-05-31 ENCOUNTER — Ambulatory Visit: Payer: 59 | Admitting: Podiatry

## 2022-05-31 DIAGNOSIS — J309 Allergic rhinitis, unspecified: Secondary | ICD-10-CM | POA: Diagnosis not present

## 2022-06-02 ENCOUNTER — Encounter: Payer: Self-pay | Admitting: Podiatry

## 2022-06-02 ENCOUNTER — Ambulatory Visit (INDEPENDENT_AMBULATORY_CARE_PROVIDER_SITE_OTHER): Payer: 59 | Admitting: Podiatry

## 2022-06-02 DIAGNOSIS — E119 Type 2 diabetes mellitus without complications: Secondary | ICD-10-CM | POA: Diagnosis not present

## 2022-06-02 DIAGNOSIS — M79675 Pain in left toe(s): Secondary | ICD-10-CM | POA: Diagnosis not present

## 2022-06-02 DIAGNOSIS — M2012 Hallux valgus (acquired), left foot: Secondary | ICD-10-CM

## 2022-06-02 DIAGNOSIS — M2041 Other hammer toe(s) (acquired), right foot: Secondary | ICD-10-CM

## 2022-06-02 DIAGNOSIS — B351 Tinea unguium: Secondary | ICD-10-CM

## 2022-06-02 DIAGNOSIS — M2011 Hallux valgus (acquired), right foot: Secondary | ICD-10-CM | POA: Diagnosis not present

## 2022-06-02 DIAGNOSIS — M79674 Pain in right toe(s): Secondary | ICD-10-CM

## 2022-06-02 DIAGNOSIS — M2042 Other hammer toe(s) (acquired), left foot: Secondary | ICD-10-CM

## 2022-06-02 NOTE — Progress Notes (Signed)
ANNUAL DIABETIC FOOT EXAM  Subjective: Madeline Wood presents today for annual diabetic foot examination, at risk foot care. Pt has h/o NIDDM with PAD, and painful elongated overgrown toenails which are tender when wearing enclosed shoe gear.  Chief Complaint  Patient presents with   Nail Problem    Diabetic foot care BS-Do not check A1C-5.6 PCP-[facility] PCP VST-2 months ago    Patient confirms h/o diabetes.  Patient relates 10 year h/o diabetes.  Patient denies any h/o foot wounds.  Patient denies any numbness, tingling, burning, or pins/needle sensation in feet.  Risk factors: diabetes, HTN, CAD, hyperlipidemia, h/o tobacco use in remission.  Madeline Wood is patient's PCP. Last visit was March 31, 2022.  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    Angio-edema    Anxiety    Arthritis    Blood transfusion without reported diagnosis    1985 with hysterectomy    CAD (coronary artery disease)    stent of distal RCA in 2006   Diabetes mellitus without complication ([place])    diet controlled-    Diverticulosis    GERD (gastroesophageal reflux disease)    Gout    H/O heart artery stent    Hiatal hernia    History of nuclear stress test 03/2011   negative lexiscan myoview; normal perfusion   Hyperlipidemia    Hypertension    Knee pain    Neuromuscular disorder ([place])    Hiatal Hernia    Obesity    Positive TB test    Venous insufficiency    Patient Active Problem List   Diagnosis Date Noted   Prediabetes 10/19/2021   Allergic reaction 09/29/2019   Allergic rhinitis 09/13/2018   Bilateral high frequency sensorineural hearing loss 01/17/2018   Subjective tinnitus of left ear 01/17/2018   Atherosclerosis of native coronary artery of native heart without angina pectoris 07/23/2017   History of food allergy 01/05/2016   Angioedema 11/17/2015   Seasonal allergic  conjunctivitis 05/04/2015   Insomnia 08/24/2014   Bilateral leg edema 04/17/2014   Diabetes mellitus without complication ([facility])    Essential hypertension    Gout    Mixed hyperlipidemia    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 MTami Ribas   TRANSTHORACIC ECHOCARDIOGRAM  12/20/2012   EF 541-93% normal systolic function, mild hypokinesis of inf myocardium; calcified MV annulua, LA & RA mildly dilated   Current Outpatient Medications on File Prior to Visit  Medication Sig Dispense Refill   albuterol (VENTOLIN HFA) 108 (90 Base) MCG/ACT inhaler TAKE 2 PUFFS INTO LUNGS EVERY 6 HOURS AS NEEDED FOR WHEEZE OR SHORTNESS OF BREATH 8.5 each 3   ALPRAZolam (XANAX) 1 MG tablet TAKE 1 TABLET BY MOUTH TWICE A DAY 60 tablet 2   amLODipine (NORVASC) 5 MG tablet TAKE 1 TABLET (5 MG TOTAL) BY MOUTH DAILY. 9[place]  tablet 1   aspirin 81 MG tablet Take 81 mg by mouth daily.     atorvastatin (LIPITOR) 80 MG tablet TAKE 1 TABLET BY MOUTH EVERY DAY 90 tablet 2   colchicine 0.6 MG tablet PLEASE SEE ATTACHED FOR DETAILED DIRECTIONS     cyclobenzaprine (FLEXERIL) 5 MG tablet TAKE 1 TABLET BY MOUTH THREE TIMES A DAY AS NEEDED FOR MUSCLE SPASMS 20 tablet 0   EPINEPHrine (EPIPEN 2-PAK) 0.3 mg/0.3 mL IJ SOAJ injection Use as directed for life threatening allergic reactions 1 each 3   fexofenadine (ALLEGRA) 180 MG tablet Take 1 tablet (180 mg total) by mouth daily. 30 tablet 5   fish oil-omega-3 fatty acids 1000 MG capsule Take 1 g by mouth. Takes occasionally     fluticasone (FLONASE) 50  MCG/ACT nasal spray PLACE 1-2 SPRAYS INTO BOTH NOSTRILS DAILY AS NEEDED FOR ALLERGIES OR RHINITIS. 48 mL 1   losartan (COZAAR) 100 MG tablet Take 1 tablet (100 mg total) by mouth daily. 90 tablet 3   metoprolol tartrate (LOPRESSOR) 50 MG tablet TAKE 1 TABLET BY MOUTH EVERY MORNING AND 1 AND 1/2 TABLET EVERY EVENING 225 tablet 1   Multiple Vitamin (MULTIVITAMIN) tablet Take 1 tablet by mouth. occasionally     nitroGLYCERIN (NITROSTAT) 0.4 MG SL tablet PLACE 1 TABLET UNDER THE TONGUE EVERY 5 MINUTES AS NEEDED FOR CHEST PAIN. 25 tablet 2   pantoprazole (PROTONIX) 40 MG tablet Take 1 tablet by mouth daily.     Wheat Dextrin (BENEFIBER PO) Take by mouth. Daily     zolpidem (AMBIEN) 10 MG tablet TAKE 1 TABLET BY MOUTH EVERY DAY AT BEDTIME AS NEEDED FOR SLEEP 30 tablet 3   [DISCONTINUED] omeprazole (PRILOSEC OTC) 20 MG tablet Take 1 tablet (20 mg total) by mouth daily. 30 tablet 1   No current facility-administered medications on file prior to visit.    Allergies  Allergen Reactions   Ciprofloxacin    Citalopram Hydrobromide    Escitalopram Oxalate    Paroxetine    Polysporin [Bacitracin-Polymyxin B]    Social History   Occupational History   Occupation: Biomedical engineer  Tobacco Use   Smoking status: Former    Types: Cigarettes    Quit date: 12/20/2001    Years since quitting: 20.4   Smokeless tobacco: Never  Vaping Use   Vaping Use: Never used  Substance and Sexual Activity   Alcohol use: No    Alcohol/week: 0.0 standard drinks of alcohol   Drug use: No   Sexual activity: Not on file   Family History  Problem Relation Age of Onset   Ovarian cancer Mother    Other Father        homicide   Diabetes Sister    Hypertension Sister    Pancreatic cancer Brother    Heart attack Brother        x2   Hypertension Brother    Esophageal cancer Brother    Prostate cancer Brother    Colon cancer Neg Hx    Colon polyps Neg Hx    Rectal cancer Neg Hx    Stomach cancer Neg Hx     Immunization History  Administered Date(s) Administered   Fluad Quad(high Dose 65+) 05/20/2019, 07/20/2021   H1N1 08/03/2008   Influenza Whole 05/28/2008, 07/07/2011   Influenza, High Dose Seasonal PF 06/04/2017, 05/29/2018, 07/14/2020   Influenza,inj,Quad PF,6+ Mos 05/28/2013, 07/02/2014, 06/01/2015, 05/24/2016   Influenza-Unspecified 06/09/2008, 06/03/2009, 05/19/2010   Pfizer Covid-19 Vaccine Bivalent Booster 40yr & up 09/08/2021  Pneumococcal Conjugate-13 08/18/2013   Pneumococcal Polysaccharide-23 02/25/2014   Td 05/13/2007, 01/25/2012   Tdap 01/25/2012   Unspecified SARS-COV-2 Vaccination 10/20/2019, 10/31/2019, 06/28/2020   Zoster, Live 07/02/2014     Review of Systems: Negative except as noted in the HPI.   Objective: There were no vitals filed for this visit.  Madeline Wood is a pleasant 76 y.o. female in NAD. AAO X 3.  Vascular Examination: CFT <4 seconds b/l. DP/PT pulses faintly palpable b/l. Skin temperature gradient warm to warm b/l. No pain with calf compression. No ischemia or gangrene. No cyanosis or clubbing noted b/l. Pedal hair sparse.   Neurological Examination: Sensation grossly intact b/l with 10 gram monofilament. Vibratory sensation intact b/l.   Dermatological Examination: Pedal skin warm and supple b/l. Toenails 1-5 b/l thick, discolored, elongated with subungual debris and pain on dorsal palpation.  Pedal skin is warm and supple b/l LE. No open wounds b/l LE. No interdigital macerations noted b/l LE. No hyperkeratotic nor porokeratotic lesions present on today's visit.  Musculoskeletal Examination: Muscle strength 5/5 to b/l LE. HAV with bunion bilaterally and hammertoes 2-5 b/l.  Radiographs: None  Last A1c:      Latest Ref Rng & Units 03/23/2022   12:02 PM  Hemoglobin A1C  Hemoglobin-A1c <5.7 % of total Hgb 5.9     Footwear Assessment: Does the patient wear appropriate shoes? Yes. Does the patient need inserts/orthotics? Yes.  ADA  Risk Categorization: Low Risk :  Patient has all of the following: Intact protective sensation No prior foot ulcer  No severe deformity Pedal pulses present  Assessment: 1. Pain due to onychomycosis of toenails of both feet   2. Hallux valgus, acquired, bilateral   3. Acquired hammertoes of both feet   4. Diabetes mellitus without complication ([place])   5. Encounter for diabetic foot exam [facility])     Plan: -Patient was evaluated and treated. All patient's and/or POA's questions/concerns answered on today's visit. -Diabetic foot examination performed today. -Stressed the importance of good glycemic control and the detriment of not  controlling glucose levels in relation to the foot. -Continue foot and shoe inspections daily. Monitor blood glucose per PCP/Endocrinologist's recommendations. -Mycotic toenails 1-5 bilaterally were debrided in length and girth with sterile nail nippers and dremel without incident. -Patient/POA to call should there be question/concern in the interim. Return in about 3 months (around 09/02/2022).  Marzetta Board, [name]

## 2022-06-04 ENCOUNTER — Other Ambulatory Visit: Payer: Self-pay | Admitting: Internal Medicine

## 2022-06-07 DIAGNOSIS — J3081 Allergic rhinitis due to animal (cat) (dog) hair and dander: Secondary | ICD-10-CM | POA: Diagnosis not present

## 2022-06-07 NOTE — Progress Notes (Signed)
VIALS EXP 06-08-23

## 2022-06-08 DIAGNOSIS — J3089 Other allergic rhinitis: Secondary | ICD-10-CM | POA: Diagnosis not present

## 2022-06-11 ENCOUNTER — Other Ambulatory Visit: Payer: Self-pay | Admitting: Family Medicine

## 2022-06-20 ENCOUNTER — Other Ambulatory Visit: Payer: Self-pay | Admitting: Internal Medicine

## 2022-07-12 ENCOUNTER — Other Ambulatory Visit: Payer: Self-pay

## 2022-07-14 ENCOUNTER — Ambulatory Visit (INDEPENDENT_AMBULATORY_CARE_PROVIDER_SITE_OTHER): Payer: 59

## 2022-07-14 DIAGNOSIS — J309 Allergic rhinitis, unspecified: Secondary | ICD-10-CM | POA: Diagnosis not present

## 2022-07-18 ENCOUNTER — Telehealth: Payer: Self-pay | Admitting: Podiatry

## 2022-07-18 NOTE — Telephone Encounter (Signed)
Lmom to call back to schedule appt to pick up diabetic shoes.   Charges need put in . EXP 10/04/21

## 2022-07-24 ENCOUNTER — Other Ambulatory Visit: Payer: Self-pay | Admitting: Family Medicine

## 2022-07-26 ENCOUNTER — Ambulatory Visit (INDEPENDENT_AMBULATORY_CARE_PROVIDER_SITE_OTHER): Payer: 59

## 2022-07-26 DIAGNOSIS — M2142 Flat foot [pes planus] (acquired), left foot: Secondary | ICD-10-CM

## 2022-07-26 DIAGNOSIS — I739 Peripheral vascular disease, unspecified: Secondary | ICD-10-CM

## 2022-07-26 DIAGNOSIS — E119 Type 2 diabetes mellitus without complications: Secondary | ICD-10-CM

## 2022-07-26 DIAGNOSIS — M2141 Flat foot [pes planus] (acquired), right foot: Secondary | ICD-10-CM

## 2022-07-26 NOTE — Progress Notes (Signed)
Patient presents to the office today with issues concerning the diabetic shoes picked up on 07/26/22.   The shoes feel too little.  We will send the orthofeet brand, style [place] Mikes, size 10.5 x-wide back.   Reorder: Apex 1200W 11 x-wide stretchable double strap ambulator  Patient kept insoles for other shoes. Advised to bring back a pair to check fit of reorder.  Patient will be notified for a fitting appointment once the shoes arrive in office.

## 2022-07-31 ENCOUNTER — Ambulatory Visit: Payer: 59

## 2022-07-31 ENCOUNTER — Other Ambulatory Visit (INDEPENDENT_AMBULATORY_CARE_PROVIDER_SITE_OTHER): Payer: 59

## 2022-07-31 DIAGNOSIS — Z23 Encounter for immunization: Secondary | ICD-10-CM | POA: Diagnosis not present

## 2022-08-22 ENCOUNTER — Telehealth: Payer: Self-pay | Admitting: Podiatry

## 2022-08-22 NOTE — Telephone Encounter (Signed)
Lmom to call back to pick up diabetic shoes

## 2022-08-30 ENCOUNTER — Encounter: Payer: Self-pay | Admitting: Family Medicine

## 2022-08-30 ENCOUNTER — Ambulatory Visit (INDEPENDENT_AMBULATORY_CARE_PROVIDER_SITE_OTHER): Payer: 59 | Admitting: Family Medicine

## 2022-08-30 VITALS — BP 120/80 | HR 71 | Temp 98.3°F | Ht 66.0 in | Wt 176.0 lb

## 2022-08-30 DIAGNOSIS — J069 Acute upper respiratory infection, unspecified: Secondary | ICD-10-CM | POA: Insufficient documentation

## 2022-08-30 NOTE — Patient Instructions (Signed)

## 2022-08-30 NOTE — Progress Notes (Signed)
Acute Office Visit  Subjective:     Patient ID: Madeline Wood, female    DOB: May 06, 1946, 77 y.o.   MRN: [id number]  Chief Complaint  Patient presents with   Acute Visit    Cough and congestion x 3 days.    HPI Patient is in today for 3 days of mucopurulent cough and congestion that is overall improving. Denies fever, chills, night sweats, chest pain, shortness of breath, wheezing, body aches, sinus pressure, headache, sore throat. Has tried Mucinex, Coricidin No known sick exposures, no covid test   Review of Systems  All other systems reviewed and are negative.   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    Angio-edema    Anxiety    Arthritis    Blood transfusion without reported diagnosis    1985 with hysterectomy    CAD (coronary artery disease)    stent of distal RCA in 2006   Diabetes mellitus without complication ([place])    diet controlled-    Diverticulosis    GERD (gastroesophageal reflux disease)    Gout    H/O heart artery stent    Hiatal hernia    History of nuclear stress test 03/2011   negative lexiscan myoview; normal perfusion   Hyperlipidemia    Hypertension    Knee pain    Neuromuscular disorder ([place])    Hiatal Hernia    Obesity    Positive TB test    Venous insufficiency    Past Surgical History:  Procedure Laterality Date   ABDOMINAL HYSTERECTOMY     BACK SURGERY  2012   BREAST LUMPECTOMY     COLONOSCOPY  2011   CORONARY ANGIOPLASTY WITH STENT PLACEMENT  06/29/2005   Taxus 2.5x54m DES to distal RCA (Dr. MTami Ribas   TRANSTHORACIC ECHOCARDIOGRAM  12/20/2012   EF 548-54% normal systolic function, mild hypokinesis of inf myocardium; calcified MV annulua, LA & RA mildly dilated   Current Outpatient Medications on File Prior to Visit  Medication Sig Dispense Refill   albuterol (VENTOLIN HFA) 108 (90 Base) MCG/ACT inhaler TAKE 2 PUFFS INTO LUNGS  EVERY 6 HOURS AS NEEDED FOR WHEEZE OR SHORTNESS OF BREATH 8.5 each 3   ALPRAZolam (XANAX) 1 MG tablet TAKE 1 TABLET BY MOUTH TWICE A DAY 60 tablet 1   amLODipine (NORVASC) 5 MG tablet TAKE 1 TABLET (5 MG TOTAL) BY MOUTH DAILY. 90 tablet 1   aspirin 81 MG tablet Take 81 mg by mouth daily.     atorvastatin (LIPITOR) 80 MG tablet TAKE 1 TABLET BY MOUTH EVERY DAY 90 tablet 2   colchicine 0.6 MG tablet PLEASE SEE ATTACHED FOR DETAILED DIRECTIONS     cyclobenzaprine (FLEXERIL) 5 MG tablet TAKE 1 TABLET BY MOUTH THREE TIMES A DAY AS NEEDED FOR MUSCLE SPASMS 20 tablet 0   EPINEPHrine (EPIPEN 2-PAK) 0.3 mg/0.3 mL IJ SOAJ injection Use as directed for life threatening allergic reactions 1 each 3   fexofenadine (ALLEGRA) 180 MG tablet Take 1 tablet (180 mg total) by mouth daily. 30 tablet 5   fish oil-omega-3 fatty acids 1000 MG capsule Take 1 g by mouth. Takes occasionally     fluticasone (FLONASE) 50 MCG/ACT nasal spray PLACE 1-2 SPRAYS INTO BOTH NOSTRILS DAILY AS NEEDED FOR ALLERGIES OR RHINITIS. 48 mL 1   losartan (COZAAR) 100 MG tablet Take 1 tablet (100 mg total) by mouth daily. 90 tablet  3   metoprolol tartrate (LOPRESSOR) 50 MG tablet TAKE 1 TABLET BY MOUTH EVERY MORNING AND 1 AND 1/2 TABLET EVERY EVENING 225 tablet 1   Multiple Vitamin (MULTIVITAMIN) tablet Take 1 tablet by mouth. occasionally     nitroGLYCERIN (NITROSTAT) 0.4 MG SL tablet PLACE 1 TABLET UNDER THE TONGUE EVERY 5 MINUTES AS NEEDED FOR CHEST PAIN. 25 tablet 2   pantoprazole (PROTONIX) 40 MG tablet Take 1 tablet by mouth daily.     Wheat Dextrin (BENEFIBER PO) Take by mouth. Daily     zolpidem (AMBIEN) 10 MG tablet TAKE 1 TABLET BY MOUTH EVERY DAY AT BEDTIME AS NEEDED FOR SLEEP 30 tablet 3   [DISCONTINUED] omeprazole (PRILOSEC OTC) 20 MG tablet Take 1 tablet (20 mg total) by mouth daily. 30 tablet 1   No current facility-administered medications on file prior to visit.   Allergies  Allergen Reactions   Ciprofloxacin    Citalopram  Hydrobromide    Escitalopram Oxalate    Paroxetine    Polysporin [Bacitracin-Polymyxin B]        Objective:    BP 120/80   Pulse 71   Temp 98.3 F (36.8 C) (Oral)   Ht '5\' 6"'$  (1.676 m)   Wt 176 lb (79.8 kg)   SpO2 99%   BMI 28.41 kg/m    Physical Exam Vitals and nursing note reviewed.  Constitutional:      Appearance: Normal appearance. She is normal weight.  HENT:     Head: Normocephalic and atraumatic.  Cardiovascular:     Rate and Rhythm: Normal rate and regular rhythm.     Pulses: Normal pulses.     Heart sounds: Normal heart sounds.  Pulmonary:     Effort: Pulmonary effort is normal.     Breath sounds: Rhonchi present.  Skin:    General: Skin is warm and dry.  Neurological:     General: No focal deficit present.     Mental Status: She is alert and oriented to person, place, and time. Mental status is at baseline.  Psychiatric:        Mood and Affect: Mood normal.        Behavior: Behavior normal.        Thought Content: Thought content normal.        Judgment: Judgment normal.     No results found for any visits on 08/30/22.      Assessment & Plan:   Problem List Items Addressed This Visit       Respiratory   Viral URI with cough - Primary    Reassured patient that symptoms and exam findings are most consistent with a viral upper respiratory infection and explained lack of efficacy of antibiotics against viruses.  Discussed expected course and features suggestive of secondary bacterial infection.  Continue supportive care. Increase fluid intake with water or electrolyte solution like pedialyte. Encouraged acetaminophen as needed for fever/pain. Encouraged salt water gargling, chloraseptic spray and throat lozenges. Encouraged OTC guaifenesin. Encouraged saline sinus flushes and/or neti with humidified air.        Relevant Orders   SARS-CoV-2 RNA, Influenza A/B, and RSV RNA, Qualitative NAAT    No orders of the defined types were placed in this  encounter.   Return if symptoms worsen or fail to improve.  Rubie Maid, FNP

## 2022-08-30 NOTE — Assessment & Plan Note (Signed)

## 2022-08-31 ENCOUNTER — Other Ambulatory Visit: Payer: Self-pay | Admitting: Internal Medicine

## 2022-08-31 ENCOUNTER — Telehealth: Payer: Self-pay

## 2022-08-31 ENCOUNTER — Other Ambulatory Visit: Payer: Self-pay | Admitting: Family Medicine

## 2022-08-31 LAB — SARS-COV-2 RNA, INFLUENZA A/B, AND RSV RNA, QUALITATIVE NAAT
INFLUENZA A RNA: NOT DETECTED
INFLUENZA B RNA: NOT DETECTED
RSV RNA: NOT DETECTED
SARS COV2 RNA: DETECTED — AB

## 2022-08-31 MED ORDER — NIRMATRELVIR/RITONAVIR (PAXLOVID)TABLET: 3.0000 | ORAL_TABLET | Freq: Two times a day (BID) | ORAL | 0 refills | Status: AC

## 2022-08-31 MED ORDER — BENZONATATE 200 MG PO CAPS: 200.0000 mg | ORAL_CAPSULE | Freq: Two times a day (BID) | ORAL | 0 refills | Status: DC | PRN

## 2022-08-31 NOTE — Telephone Encounter (Signed)
Pt was seen yesterday and asks if an Rx for Tessalon Perles could be sent in to help with her cough. Pt states she has used them before and they work well for her. Thank you!

## 2022-09-12 ENCOUNTER — Ambulatory Visit: Payer: 59 | Admitting: Podiatry

## 2022-09-19 ENCOUNTER — Encounter: Payer: Self-pay | Admitting: Podiatry

## 2022-09-19 ENCOUNTER — Ambulatory Visit (INDEPENDENT_AMBULATORY_CARE_PROVIDER_SITE_OTHER): Payer: 59 | Admitting: Podiatry

## 2022-09-19 VITALS — BP 156/80

## 2022-09-19 DIAGNOSIS — B351 Tinea unguium: Secondary | ICD-10-CM

## 2022-09-19 DIAGNOSIS — M2012 Hallux valgus (acquired), left foot: Secondary | ICD-10-CM | POA: Diagnosis not present

## 2022-09-19 DIAGNOSIS — M79675 Pain in left toe(s): Secondary | ICD-10-CM | POA: Diagnosis not present

## 2022-09-19 DIAGNOSIS — M2041 Other hammer toe(s) (acquired), right foot: Secondary | ICD-10-CM | POA: Diagnosis not present

## 2022-09-19 DIAGNOSIS — M2011 Hallux valgus (acquired), right foot: Secondary | ICD-10-CM

## 2022-09-19 DIAGNOSIS — M79674 Pain in right toe(s): Secondary | ICD-10-CM

## 2022-09-19 DIAGNOSIS — M2042 Other hammer toe(s) (acquired), left foot: Secondary | ICD-10-CM | POA: Diagnosis not present

## 2022-09-19 DIAGNOSIS — E1151 Type 2 diabetes mellitus with diabetic peripheral angiopathy without gangrene: Secondary | ICD-10-CM | POA: Diagnosis not present

## 2022-09-19 NOTE — Progress Notes (Signed)
  Subjective:  Patient ID: Madeline Wood, female    DOB: 19-Jul-1946,  MRN: [id number]  Madeline Wood presents to clinic today for at risk foot care. Pt has h/o NIDDM with PAD and painful thick toenails that are difficult to trim. Pain interferes with ambulation. Aggravating factors include wearing enclosed shoe gear. Pain is relieved with periodic professional debridement.  Chief Complaint  Patient presents with   Nail Problem    Prediabetic  BS-do not check A1C-5.? PCP-Pickard, [name] PCP VST-3 weeks ago   New problem(s): None.   PCP is Madeline Frizzle, MD.  Allergies  Allergen Reactions   Ciprofloxacin    Citalopram Hydrobromide    Escitalopram Oxalate    Paroxetine    Polysporin [Bacitracin-Polymyxin B]     Review of Systems: Negative except as noted in the HPI.  Objective: No changes noted in today's physical examination. There were no vitals filed for this visit.  Madeline RHINEHART is a pleasant 77 y.o. female WD, WN in NAD. AAO x 3.  Vascular Examination: CFT <4 seconds b/l. DP/PT pulses faintly palpable b/l. Skin temperature gradient warm to warm b/l. No pain with calf compression. No ischemia or gangrene. No cyanosis or clubbing noted b/l. Pedal hair sparse.   Neurological Examination: Sensation grossly intact b/l with 10 gram monofilament. Vibratory sensation intact b/l.   Dermatological Examination: Pedal skin warm and supple b/l. Toenails 1-5 b/l thick, discolored, elongated with subungual debris and pain on dorsal palpation.  Pedal skin is warm and supple b/l LE. No open wounds b/l LE. No interdigital macerations noted b/l LE. No hyperkeratotic nor porokeratotic lesions present on today's visit.  Musculoskeletal Examination: Muscle strength 5/5 to b/l LE. HAV with bunion bilaterally and hammertoes 2-5 b/l. Extra depth shoes fit with inserts. Patient noted no discomfort.  Radiographs: None  Assessment/Plan: 1. Pain due to onychomycosis of toenails of both  feet   2. Type II diabetes mellitus with peripheral circulatory disorder (HCC)     No orders of the defined types were placed in this encounter. -Dispensed one pair diabetic shoes and 3 pair total contact insoles. Shoes were appropriate fit with no heel slippage. Reviewed warranty information and patient signed all paperwork stating patient received shoes, insert(s)/filler(s), break-in instructions and warranty information. Patient instructed not to wear shoes outside unless completely satisfied. Patient related understanding.  -Consent given for treatment as described below: -Examined patient. -Continue diabetic foot care principles: inspect feet daily, monitor glucose as recommended by PCP and/or Endocrinologist, and follow prescribed diet per PCP, Endocrinologist and/or dietician. -Continue supportive shoe gear daily. -Toenails 1-5 b/l were debrided in length and girth with sterile nail nippers and dremel without iatrogenic bleeding.  -Patient/POA to call should there be question/concern in the interim.   Return in about 3 months (around 12/19/2022).  Marzetta Board, [name]

## 2022-09-20 ENCOUNTER — Other Ambulatory Visit: Payer: 59

## 2022-09-21 ENCOUNTER — Ambulatory Visit: Payer: 59 | Admitting: Internal Medicine

## 2022-09-27 ENCOUNTER — Ambulatory Visit: Payer: 59 | Attending: Internal Medicine | Admitting: Physician Assistant

## 2022-09-27 ENCOUNTER — Encounter: Payer: Self-pay | Admitting: Physician Assistant

## 2022-09-27 VITALS — BP 154/86 | HR 67 | Ht 66.0 in | Wt 175.4 lb

## 2022-09-27 DIAGNOSIS — E785 Hyperlipidemia, unspecified: Secondary | ICD-10-CM | POA: Diagnosis not present

## 2022-09-27 DIAGNOSIS — I2510 Atherosclerotic heart disease of native coronary artery without angina pectoris: Secondary | ICD-10-CM

## 2022-09-27 DIAGNOSIS — I10 Essential (primary) hypertension: Secondary | ICD-10-CM

## 2022-09-27 MED ORDER — EZETIMIBE 10 MG PO TABS: 10.0000 mg | ORAL_TABLET | Freq: Every day | ORAL | 3 refills | Status: DC

## 2022-09-27 NOTE — Progress Notes (Signed)
Cardiology Office Note:    Date:  09/29/2022   ID:  Madeline Wood, DOB [birth year], MRN [id number]  PCP:  [name], MD   [facility] Cardiologist:  Pixie Casino, MD     Referring MD: [name], MD   Chief Complaint  Patient presents with   Follow-up    Seen for Dr. Debara Pickett    History of Present Illness:    Madeline Wood is a 77 y.o. female with a hx of CAD, hyperlipidemia, hypertension, and diet-controlled diabetes.  She had DES to distal RCA in 2006.  A Myoview in 2012 was negative.  Last echocardiogram obtained in April 2014 showed EF 50 to 55%, mildly dilated left and the right atrium, peak PA pressure 38 mmHg.  She was previously followed by Dr. Rollene Fare and later established with Dr. Debara Pickett.  Patient was last seen by Dr. Debara Pickett in November 2022 at which time she was doing well.  She did have hyponatremia on the blood work at the time.  Her hydrochlorothiazide was later stopped due to severe hyponatremia with sodium level of 125, after stopping hydrochlorothiazide, sodium level improved.  This is being followed by family medicine service.  Last blood work in August 2023 showed a sodium level 137.  Patient presents today for follow-up.  Overall she has been doing well without exertional chest pain or worsening dyspnea.  She has no lower extremity edema, orthopnea or PND.  Blood pressure is elevated today, even on repeat recheck, blood pressure remain elevated at 154/86.  She says her blood pressure is typically much better controlled at home in the 130s range.  I did recommend moved losartan to nighttime to make her blood pressure more even during the day.  This way she will take the metoprolol and amlodipine during the day and then metoprolol and losartan at night.  She is to monitor her blood pressure for the next 2 weeks, if systolic blood pressure remain greater than 140 mmHg, she should increase amlodipine to 10 mg daily and give [place] a call.   Otherwise, her last blood work in July 2023 showed LDL remains elevated at 86, I recommended addition of low-dose Zetia 10 mg daily on top of Lipitor.  She should have a repeat fasting lipid panel and LFT in 3 months.  Otherwise she can follow-up in 1 year.   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    Angio-edema    Anxiety    Arthritis    Blood transfusion without reported diagnosis    1985 with hysterectomy    CAD (coronary artery disease)    stent of distal RCA in 2006   Diabetes mellitus without complication ([place])    diet controlled-    Diverticulosis    GERD (gastroesophageal reflux disease)    Gout    H/O heart artery stent    Hiatal hernia    History of nuclear stress test 03/2011   negative lexiscan myoview; normal perfusion   Hyperlipidemia    Hypertension    Knee pain    Neuromuscular disorder ([place])    Hiatal Hernia    Obesity    Positive TB test    Venous insufficiency     Past Surgical History:  Procedure Laterality Date   ABDOMINAL HYSTERECTOMY     BACK SURGERY  2012   BREAST LUMPECTOMY  COLONOSCOPY  2011   CORONARY ANGIOPLASTY WITH STENT PLACEMENT  06/29/2005   Taxus 2.5x4m DES to distal RCA (Dr. MTami Ribas   TRANSTHORACIC ECHOCARDIOGRAM  12/20/2012   EF 585-63% normal systolic function, mild hypokinesis of inf myocardium; calcified MV annulua, LA & RA mildly dilated    Current Medications: Current Meds  Medication Sig   albuterol (VENTOLIN HFA) 108 (90 Base) MCG/ACT inhaler TAKE 2 PUFFS INTO LUNGS EVERY 6 HOURS AS NEEDED FOR WHEEZE OR SHORTNESS OF BREATH   ALPRAZolam (XANAX) 1 MG tablet TAKE 1 TABLET BY MOUTH TWICE A DAY   amLODipine (NORVASC) 5 MG tablet TAKE 1 TABLET (5 MG TOTAL) BY MOUTH DAILY.   aspirin 81 MG tablet Take 81 mg by mouth daily.   atorvastatin (LIPITOR) 80 MG tablet TAKE 1 TABLET BY MOUTH EVERY DAY   benzonatate (TESSALON) 200 MG capsule  Take 1 capsule (200 mg total) by mouth 2 (two) times daily as needed for cough.   colchicine 0.6 MG tablet PLEASE SEE ATTACHED FOR DETAILED DIRECTIONS   cyclobenzaprine (FLEXERIL) 5 MG tablet TAKE 1 TABLET BY MOUTH THREE TIMES A DAY AS NEEDED FOR MUSCLE SPASMS   EPINEPHrine (EPIPEN 2-PAK) 0.3 mg/0.3 mL IJ SOAJ injection Use as directed for life threatening allergic reactions   ezetimibe (ZETIA) 10 MG tablet Take 1 tablet (10 mg total) by mouth daily.   fexofenadine (ALLEGRA) 180 MG tablet Take 1 tablet (180 mg total) by mouth daily.   fish oil-omega-3 fatty acids 1000 MG capsule Take 1 g by mouth. Takes occasionally   fluticasone (FLONASE) 50 MCG/ACT nasal spray PLACE 1-2 SPRAYS INTO BOTH NOSTRILS DAILY AS NEEDED FOR ALLERGIES OR RHINITIS.   losartan (COZAAR) 100 MG tablet Take 1 tablet (100 mg total) by mouth daily.   metoprolol tartrate (LOPRESSOR) 50 MG tablet TAKE 1 TABLET BY MOUTH EVERY MORNING AND 1 AND 1/2 TABLET EVERY EVENING   Multiple Vitamin (MULTIVITAMIN) tablet Take 1 tablet by mouth. occasionally   nitroGLYCERIN (NITROSTAT) 0.4 MG SL tablet PLACE 1 TABLET UNDER THE TONGUE EVERY 5 MINUTES AS NEEDED FOR CHEST PAIN.   pantoprazole (PROTONIX) 40 MG tablet Take 1 tablet by mouth daily.   Wheat Dextrin (BENEFIBER PO) Take by mouth. 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  Tobacco Use   Smoking status: Former    Types: Cigarettes    Quit date: 12/20/2001    Years since quitting: 20.7   Smokeless tobacco: Never  Vaping Use   Vaping Use: Never used   Substance and Sexual Activity   Alcohol use: No    Alcohol/week: 0.0 standard drinks of alcohol   Drug use: No   Sexual activity: Not on file  Other Topics Concern   Not on file  Social History Narrative   Not on file   Social Determinants of Health   Financial Resource Strain: Low Risk  (02/16/2022)   Overall Financial Resource Strain (CARDIA)    Difficulty of Paying Living Expenses: Not very hard  Food Insecurity: No Food Insecurity (02/16/2022)  Hunger Vital Sign    Worried About Running Out of Food in the Last Year: Never true    Ran Out of Food in the Last Year: Never true  Transportation Needs: No Transportation Needs (02/16/2022)   PRAPARE - Hydrologist (Medical): No    Lack of Transportation (Non-Medical): No  Physical Activity: Insufficiently Active (02/16/2022)   Exercise Vital Sign    Days of Exercise per Week: 3 days    Minutes of Exercise per Session: 30 min  Stress: No Stress Concern Present (02/16/2022)   [place]    Feeling of Stress : Not at all  Social Connections: Moderately Integrated (02/16/2022)   Social Connection and Isolation Panel [NHANES]    Frequency of Communication with Friends and Family: More than three times a week    Frequency of Social Gatherings with Friends and Family: More than three times a week    Attends Religious Services: More than 4 times per year    Active Member of [employer] or Organizations: Yes    Attends Music therapist: More than 4 times per year    Marital Status: Never married     Family History: The patient's family history includes Diabetes in her sister; Esophageal cancer in her brother; Heart attack in her brother; Hypertension in her brother and sister; Other in her father; Ovarian cancer in her mother; Pancreatic cancer in her brother; Prostate cancer in her brother. There is no history of Colon cancer, Colon polyps,  Rectal cancer, or Stomach cancer.  ROS:   Please see the history of present illness.     All other systems reviewed and are negative.  EKGs/Labs/Other Studies Reviewed:    The following studies were reviewed today:  Echo 12/20/2012 LV EF: 50% -   55%  Study Conclusions   - Left ventricle: The cavity size was normal. Wall thickness    was normal. Systolic function was normal. The estimated    ejection fraction was in the range of 50% to 55%. Mild    hypokinesis of the inferior myocardium. Left ventricular    diastolic function parameters were normal.  - Mitral valve: Calcified annulus.  - Left atrium: The atrium was mildly dilated.  - Right atrium: The atrium was mildly dilated.  - Atrial septum: No defect or patent foramen ovale was    identified.  - Pulmonary arteries: PA peak pressure: 55m Hg (S).  Impressions:   - Techndically difficult study. The right ventricular    systolic pressure was increased consistent with mild    pulmonary hypertension.   EKG:  EKG is ordered today.  The ekg ordered today demonstrates normal sinus rhythm, no significant ST-T wave changes.  Recent Labs: 12/26/2021: ALT 11; Hemoglobin 12.7; Platelets 240 03/31/2022: BUN 10; Creat 0.63; Potassium 4.5; Sodium 137  Recent Lipid Panel    Component Value Date/Time   CHOL 166 03/23/2022 1202   TRIG 49 03/23/2022 1202   HDL 66 03/23/2022 1202   CHOLHDL 2.5 03/23/2022 1202   VLDL 12 01/04/2017 0945   LDLCALC 86 03/23/2022 1202     Risk Assessment/Calculations:           Physical Exam:    VS:  BP (!) 154/86   Pulse 67   Ht '5\' 6"'$  (1.676 m)   Wt 175 lb 6.4 oz (79.6 kg)   SpO2 99%   BMI 28.31 kg/m  Wt Readings from Last 3 Encounters:  09/27/22 175 lb 6.4 oz (79.6 kg)  08/30/22 176 lb (79.8 kg)  03/31/22 173 lb 12.8 oz (78.8 kg)     GEN:  Well nourished, well developed in no acute distress HEENT: Normal NECK: No JVD; No carotid bruits LYMPHATICS: No lymphadenopathy CARDIAC:  RRR, no murmurs, rubs, gallops RESPIRATORY:  Clear to auscultation without rales, wheezing or rhonchi  ABDOMEN: Soft, non-tender, non-distended MUSCULOSKELETAL:  No edema; No deformity  SKIN: Warm and dry NEUROLOGIC:  Alert and oriented x 3 PSYCHIATRIC:  Normal affect   ASSESSMENT:    1. Coronary artery disease involving native coronary artery of native heart without angina pectoris   2. Hyperlipidemia LDL goal <70   3. Essential hypertension    PLAN:    In order of problems listed above:  CAD: Denies any recent chest pain.  Continue all aspirin and Lipitor  Hypertension: Blood pressure elevated today even on repeat check.  However she says her blood pressure is normally well-controlled at home.  I recommended continue observation, move losartan to nighttime to make her blood pressure more even during the day.  If systolic blood pressure remain elevated greater than 140 mmHg after 2 weeks, she should let us know, we will increase amlodipine to 10 mg daily at that time.  Hyperlipidemia: LDL remains elevated, will add Zetia.  Repeat fasting lipid panel and LFT in 3 months.           Medication Adjustments/Labs and Tests Ordered: Current medicines are reviewed at length with the patient today.  Concerns regarding medicines are outlined above.  Orders Placed This Encounter  Procedures   Lipid panel   Hepatic function panel   EKG 12-Lead   Meds ordered this encounter  Medications   ezetimibe (ZETIA) 10 MG tablet    Sig: Take 1 tablet (10 mg total) by mouth daily.    Dispense:  90 tablet    Refill:  3    Patient Instructions  Medication Instructions:  START Zetia 10 mg daily  SWITCH Losartan to night time   *If you need a refill on your cardiac medications before your next appointment, please call your pharmacy*  Lab Work: Your physician recommends that you return for lab work in 3 months:  Fasting Lipid Panel-DO NOT eat or drink past midnight. Okay to have water the  morning of blood work Hepatic (Liver) Function Test  If you have labs (blood work) drawn today and your tests are completely normal, you will receive your results only by: MyChart Message (if you have MyChart) OR A paper copy in the mail If you have any lab test that is abnormal or we need to change your treatment, we will call you to review the results.  Testing/Procedures: NONE ordered at this time of appointment   Follow-Up: At [facility], you and your health needs are our priority.  As part of our continuing mission to provide you with exceptional heart care, we have created designated Provider Care Teams.  These Care Teams include your primary Cardiologist (physician) and Advanced Practice Providers (APPs -  Physician Assistants and Nurse Practitioners) who all work together to provide you with the care you need, when you need it.  We recommend signing up for the patient portal called "MyChart".  Sign up information is provided on this After Visit Summary.  MyChart is used to connect with patients for Virtual Visits (Telemedicine).  Patients are able to view lab/test results, encounter notes,  upcoming appointments, etc.  Non-urgent messages can be sent to your provider as well.   To learn more about what you can do with MyChart, go to [email or web address].    Your next appointment:   1 year(s)  Provider:   Pixie Casino, MD     Other Instructions MONITOR blood pressure at home daily for 2 week. If your systolic (top number) is consistently greater than 140 mmHg INCREASE Amlodipine to 10 mg daily and give our office a call.     Hilbert Corrigan, [place]  09/29/2022 10:11 PM    [place]

## 2022-09-27 NOTE — Patient Instructions (Addendum)
Medication Instructions:  START Zetia 10 mg daily  SWITCH Losartan to night time   *If you need a refill on your cardiac medications before your next appointment, please call your pharmacy*  Lab Work: Your physician recommends that you return for lab work in 3 months:  Fasting Lipid Panel-DO NOT eat or drink past midnight. Okay to have water the morning of blood work Hepatic (Liver) Function Test  If you have labs (blood work) drawn today and your tests are completely normal, you will receive your results only by: MyChart Message (if you have MyChart) OR A paper copy in the mail If you have any lab test that is abnormal or we need to change your treatment, we will call you to review the results.  Testing/Procedures: NONE ordered at this time of appointment   Follow-Up: At [facility], you and your health needs are our priority.  As part of our continuing mission to provide you with exceptional heart care, we have created designated Provider Care Teams.  These Care Teams include your primary Cardiologist (physician) and Advanced Practice Providers (APPs -  Physician Assistants and Nurse Practitioners) who all work together to provide you with the care you need, when you need it.  We recommend signing up for the patient portal called "MyChart".  Sign up information is provided on this After Visit Summary.  MyChart is used to connect with patients for Virtual Visits (Telemedicine).  Patients are able to view lab/test results, encounter notes, upcoming appointments, etc.  Non-urgent messages can be sent to your provider as well.   To learn more about what you can do with MyChart, go to [email or web address].    Your next appointment:   1 year(s)  Provider:   Pixie Casino, MD     Other Instructions MONITOR blood pressure at home daily for 2 week. If your systolic (top number) is consistently greater than 140 mmHg INCREASE Amlodipine to 10 mg daily and give our office a call.

## 2022-09-29 ENCOUNTER — Encounter: Payer: Self-pay | Admitting: Physician Assistant

## 2022-10-02 ENCOUNTER — Other Ambulatory Visit: Payer: Self-pay | Admitting: Family Medicine

## 2022-10-02 NOTE — Telephone Encounter (Signed)
Prescription Request  10/02/2022  Is this a "Controlled Substance" medicine? No  LOV: 03/31/2022  What is the name of the medication or equipment? amLODipine (NORVASC) 5 MG tablet   Have you contacted your pharmacy to request a refill? Yes   Which pharmacy would you like this sent to?  [id number]: [phone]: [phone]   Patient notified that their request is being sent to the clinical staff for review and that they should receive a response within 2 business days.   [phone]

## 2022-10-03 MED ORDER — AMLODIPINE BESYLATE 5 MG PO TABS: 5.0000 mg | ORAL_TABLET | Freq: Every day | ORAL | 0 refills | Status: DC

## 2022-10-03 NOTE — Telephone Encounter (Signed)
Requested Prescriptions  Pending Prescriptions Disp Refills   amLODipine (NORVASC) 5 MG tablet 90 tablet 0    Sig: Take 1 tablet (5 mg total) by mouth daily.     Cardiovascular: Calcium Channel Blockers 2 Failed - 10/02/2022  4:09 PM      Failed - Last BP in normal range    BP Readings from Last 1 Encounters:  09/27/22 (!) 154/86         Failed - Valid encounter within last 6 months    Recent Outpatient Visits           9 months ago Benign essential HTN   [place] [facility] Schaumann, Cammie Mcgee, MD   9 months ago Essential hypertension   [place] [facility] Schaumann, Cammie Mcgee, MD   1 year ago Exacerbation of gout   [place] [facility] Schaumann, Cammie Mcgee, MD   1 year ago Essential hypertension   [place] [facility] Schaumann, Cammie Mcgee, MD   1 year ago Essential hypertension   [place], Warren T, MD              Passed - Last Heart Rate in normal range    Pulse Readings from Last 1 Encounters:  09/27/22 67

## 2022-10-04 ENCOUNTER — Other Ambulatory Visit: Payer: Self-pay | Admitting: Family Medicine

## 2022-10-05 NOTE — Telephone Encounter (Signed)
Requested medication (s) are due for refill today:   Provider to review  Requested medication (s) are on the active medication list:   Yes  Future visit scheduled:   No   Last ordered: 07/24/2022 #60, 1 refill  Returned because it's a non delegated refill    Requested Prescriptions  Pending Prescriptions Disp Refills   ALPRAZolam (XANAX) 1 MG tablet [Pharmacy Med Name: ALPRAZOLAM 1 MG TABLET] 60 tablet 1    Sig: TAKE 1 TABLET BY MOUTH TWICE A DAY     Not Delegated - Psychiatry: Anxiolytics/Hypnotics 2 Failed - 10/04/2022  2:58 PM      Failed - This refill cannot be delegated      Failed - Urine Drug Screen completed in last 360 days      Failed - Valid encounter within last 6 months    Recent Outpatient Visits           9 months ago Benign essential HTN   [place], Cammie Mcgee, MD   9 months ago Essential hypertension   [place], Cammie Mcgee, MD   1 year ago Exacerbation of gout   [place] Pickard, Cammie Mcgee, MD   1 year ago Essential hypertension   [place], Cammie Mcgee, MD   1 year ago Essential hypertension   [place], Cammie Mcgee, MD              Passed - Patient is not pregnant

## 2022-10-27 ENCOUNTER — Ambulatory Visit (INDEPENDENT_AMBULATORY_CARE_PROVIDER_SITE_OTHER): Payer: 59 | Admitting: *Deleted

## 2022-10-27 DIAGNOSIS — J309 Allergic rhinitis, unspecified: Secondary | ICD-10-CM | POA: Diagnosis not present

## 2022-11-01 ENCOUNTER — Ambulatory Visit: Payer: 59 | Admitting: Podiatry

## 2022-11-06 ENCOUNTER — Ambulatory Visit (INDEPENDENT_AMBULATORY_CARE_PROVIDER_SITE_OTHER): Payer: 59 | Admitting: *Deleted

## 2022-11-06 DIAGNOSIS — J309 Allergic rhinitis, unspecified: Secondary | ICD-10-CM | POA: Diagnosis not present

## 2022-11-14 ENCOUNTER — Ambulatory Visit (INDEPENDENT_AMBULATORY_CARE_PROVIDER_SITE_OTHER): Payer: 59

## 2022-11-14 DIAGNOSIS — J309 Allergic rhinitis, unspecified: Secondary | ICD-10-CM

## 2022-11-22 ENCOUNTER — Ambulatory Visit (INDEPENDENT_AMBULATORY_CARE_PROVIDER_SITE_OTHER): Payer: 59

## 2022-11-22 DIAGNOSIS — J309 Allergic rhinitis, unspecified: Secondary | ICD-10-CM | POA: Diagnosis not present

## 2022-11-29 ENCOUNTER — Other Ambulatory Visit: Payer: Self-pay | Admitting: Internal Medicine

## 2022-11-29 DIAGNOSIS — I2510 Atherosclerotic heart disease of native coronary artery without angina pectoris: Secondary | ICD-10-CM

## 2022-11-30 ENCOUNTER — Ambulatory Visit (INDEPENDENT_AMBULATORY_CARE_PROVIDER_SITE_OTHER): Payer: 59

## 2022-11-30 ENCOUNTER — Other Ambulatory Visit: Payer: Self-pay | Admitting: Family Medicine

## 2022-11-30 DIAGNOSIS — J309 Allergic rhinitis, unspecified: Secondary | ICD-10-CM

## 2022-11-30 DIAGNOSIS — K219 Gastro-esophageal reflux disease without esophagitis: Secondary | ICD-10-CM

## 2022-12-01 NOTE — Telephone Encounter (Signed)
Requested medications are due for refill today.  unsure  Requested medications are on the active medications list.  yes  Last refill. unsure  Future visit scheduled.   no  Notes to clinic.  Medication listed as historical.    Requested Prescriptions  Pending Prescriptions Disp Refills   pantoprazole (PROTONIX) 40 MG tablet [Pharmacy Med Name: PANTOPRAZOLE SOD DR 40 MG TAB] 90 tablet 1    Sig: TAKE 1 TABLET BY MOUTH EVERY DAY     Gastroenterology: Proton Pump Inhibitors Passed - 11/30/2022  4:40 PM      Passed - Valid encounter within last 12 months    Recent Outpatient Visits           11 months ago Benign essential HTN   [facility], Priscille Heidelberg, MD   11 months ago Essential hypertension   [facility], Priscille Heidelberg, MD   1 year ago Exacerbation of gout   [facility], Priscille Heidelberg, MD   1 year ago Essential hypertension   [facility] Pick[facility], Priscille Heidelberg, MD   1 year ago Essential hypertension   [facility] Pick[facility], Priscille Heidelberg, MD       Future Appointments             In 1 month Selena Batten Jamesetta So, DO [facility] at [facility]

## 2022-12-13 ENCOUNTER — Other Ambulatory Visit: Payer: Self-pay | Admitting: *Deleted

## 2022-12-13 MED ORDER — FLUTICASONE PROPIONATE 50 MCG/ACT NA SUSP: 1.0000 | Freq: Every day | NASAL | 1 refills | Status: DC | PRN

## 2022-12-18 ENCOUNTER — Ambulatory Visit (INDEPENDENT_AMBULATORY_CARE_PROVIDER_SITE_OTHER): Payer: 59

## 2022-12-18 DIAGNOSIS — J309 Allergic rhinitis, unspecified: Secondary | ICD-10-CM | POA: Diagnosis not present

## 2022-12-21 ENCOUNTER — Other Ambulatory Visit: Payer: Self-pay | Admitting: Family Medicine

## 2022-12-21 NOTE — Telephone Encounter (Signed)
Requested medications are due for refill today.  Provider to decide  Requested medications are on the active medications list.  yes  Last refill. 10/05/2022 #60 1 rf  Future visit scheduled.   no  Notes to clinic.  Refill not delegated.    Requested Prescriptions  Pending Prescriptions Disp Refills   ALPRAZolam (XANAX) 1 MG tablet [Pharmacy Med Name: ALPRAZOLAM 1 MG TABLET] 60 tablet 1    Sig: TAKE 1 TABLET BY MOUTH TWICE A DAY     Not Delegated - Psychiatry: Anxiolytics/Hypnotics 2 Failed - 12/21/2022  3:06 PM      Failed - This refill cannot be delegated      Failed - Urine Drug Screen completed in last 360 days      Failed - Valid encounter within last 6 months    Recent Outpatient Visits           12 months ago Benign essential HTN   [place] [facility] Pickard, Priscille Heidelberg, MD   12 months ago Essential hypertension   [facility] Pickard, Priscille Heidelberg, MD   1 year ago Exacerbation of gout   [facility] Pickard, Priscille Heidelberg, MD   1 year ago Essential hypertension   [facility] Pickard, Priscille Heidelberg, MD   1 year ago Essential hypertension   [facility] Pickard, Priscille Heidelberg, MD       Future Appointments             In 1 week Selena Batten Jamesetta So, DO [place] [facility] [place] at [facility] - Patient is not pregnant

## 2022-12-26 ENCOUNTER — Other Ambulatory Visit: Payer: Self-pay

## 2022-12-26 DIAGNOSIS — E785 Hyperlipidemia, unspecified: Secondary | ICD-10-CM

## 2022-12-26 LAB — LIPID PANEL
Chol/HDL Ratio: 2.1 ratio (ref 0.0–4.4)
Cholesterol, Total: 161 mg/dL (ref 100–199)
HDL: 75 mg/dL (ref >39)
LDL Chol Calc (NIH): 76 mg/dL (ref 0–99)
Triglycerides: 44 mg/dL (ref 0–149)
VLDL Cholesterol Cal: 10 mg/dL (ref 5–40)

## 2022-12-26 LAB — HEPATIC FUNCTION PANEL
ALT: 15 IU/L (ref 0–32)
AST: 28 IU/L (ref 0–40)
Albumin: 4.1 g/dL (ref 3.8–4.8)
Alkaline Phosphatase: 48 IU/L (ref 44–121)
Bilirubin Total: 0.6 mg/dL (ref 0.0–1.2)
Bilirubin, Direct: 0.19 mg/dL (ref 0.00–0.40)
Total Protein: 7.1 g/dL (ref 6.0–8.5)

## 2022-12-28 ENCOUNTER — Ambulatory Visit (INDEPENDENT_AMBULATORY_CARE_PROVIDER_SITE_OTHER): Payer: 59

## 2022-12-28 DIAGNOSIS — J309 Allergic rhinitis, unspecified: Secondary | ICD-10-CM | POA: Diagnosis not present

## 2023-01-02 NOTE — Progress Notes (Deleted)
Follow Up Note  RE: Madeline Wood MRN: [id number] DOB: 27-Jul-1946 Date of Office Visit: 01/03/2023  Referring provider: Donita Brooks, MD Primary care provider: Donita Brooks, MD  Chief Complaint: No chief complaint on file.  History of Present Illness: I had the pleasure of seeing Madeline Wood for a follow up visit at [facility] on 01/02/2023. She is a 77 y.o. female, who is being followed for allergic rhino conjunctivitis on AIT. Her previous allergy office visit was on 12/23/2021 with Thermon Leyland, FNP. Today is a regular follow up visit.  Allergic rhinitis Continue allergen avoidance measures directed toward pollens, dust mite, mold, cat, and cockroach as listed below Continue allergen immunotherapy and have access to an epinephrine autoinjector set.  Return next week for your allergy injection if you are feeling well Continue an antihistamine once a day as needed for runny nose or itch.Remember to rotate to a different antihistamine about every 3 months. Some examples of over the counter antihistamines include Zyrtec (cetirizine), Xyzal (levocetirizine), Allegra (fexofenadine), and Claritin (loratidine).  Continue Flonase 1 to 2 sprays in each nostril once a day as needed for a stuffy nose Consider saline nasal rinses as needed for nasal symptoms. Use this before any medicated nasal sprays for best result     Allergic conjunctivitis Some over the counter eye drops include Pataday one drop in each eye once a day as needed for red, itchy eyes OR Zaditor one drop in each eye twice a day as needed for red itchy eyes.   Your blood pressure was elevated at today's visit.  Contact your primary care provider immediately for management of your blood pressure  Seasonal and perennial allergic rhinoconjunctivitis Past history - no recent testing. On AIT for more than 6 years now (GRASS-WEED-TREE-MITE-DOG & MOLD-CAT-CR) Interim history - tolerating AIT every 2 weeks  with good benefit. Allegra seems to dry out oral mucosa. Continue allergy injections. Continue environmental control measures. ONLY use Allegra (fexofenadine) once a day if NEEDED. May use Flonase (fluticasone) nasal spray 1 spray per nostril twice a day as needed for nasal congestion.  Nasal saline spray (i.e., Simply Saline) or nasal saline lavage (i.e., NeilMed) is recommended as needed and prior to medicated nasal sprays. Will re-test before stopping AIT.    Allergic reaction No reactions since last visit.  Continue avoidance of products containing octinoxate and oxybenzone. For mild symptoms you can take over the counter antihistamines such as Benadryl and monitor symptoms closely. If symptoms worsen or if you have severe symptoms including breathing issues, throat closure, significant swelling, whole body hives, severe diarrhea and vomiting, lightheadedness then inject epinephrine and seek immediate medical care afterwards.   GERD (gastroesophageal reflux disease) Stable. Continue pantoprazole 40mg  daily as prescribed.  Assessment and Plan: Madeline Wood is a 77 y.o. female with: No problem-specific Assessment & Plan notes found for this encounter.  No follow-ups on file.  No orders of the defined types were placed in this encounter.  Lab Orders  No laboratory test(s) ordered today    Diagnostics: Spirometry:  Tracings reviewed. Her effort: {Blank single:19197::"Good reproducible efforts.","It was hard to get consistent efforts and there is a question as to whether this reflects a maximal maneuver.","Poor effort, data can not be interpreted."} FVC: ***L FEV1: ***L, ***% predicted FEV1/FVC ratio: ***% Interpretation: {Blank single:19197::"Spirometry consistent with mild obstructive disease","Spirometry consistent with moderate obstructive disease","Spirometry consistent with severe obstructive disease","Spirometry consistent with possible restrictive disease","Spirometry consistent  with mixed obstructive  and restrictive disease","Spirometry uninterpretable due to technique","Spirometry consistent with normal pattern","No overt abnormalities noted given today's efforts"}.  Please see scanned spirometry results for details.  Skin Testing: {Blank single:19197::"Select foods","Environmental allergy panel","Environmental allergy panel and select foods","Food allergy panel","None","Deferred due to recent antihistamines use"}. *** Results discussed with patient/family.   Medication List:  Current Outpatient Medications  Medication Sig Dispense Refill   albuterol (VENTOLIN HFA) 108 (90 Base) MCG/ACT inhaler TAKE 2 PUFFS INTO LUNGS EVERY 6 HOURS AS NEEDED FOR WHEEZE OR SHORTNESS OF BREATH 8.5 each 3   ALPRAZolam (XANAX) 1 MG tablet TAKE 1 TABLET BY MOUTH TWICE A DAY 60 tablet 1   amLODipine (NORVASC) 5 MG tablet Take 1 tablet (5 mg total) by mouth daily. 90 tablet 0   aspirin 81 MG tablet Take 81 mg by mouth daily.     atorvastatin (LIPITOR) 80 MG tablet TAKE 1 TABLET BY MOUTH EVERY DAY 90 tablet 3   benzonatate (TESSALON) 200 MG capsule Take 1 capsule (200 mg total) by mouth 2 (two) times daily as needed for cough. 20 capsule 0   colchicine 0.6 MG tablet PLEASE SEE ATTACHED FOR DETAILED DIRECTIONS     cyclobenzaprine (FLEXERIL) 5 MG tablet TAKE 1 TABLET BY MOUTH THREE TIMES A DAY AS NEEDED FOR MUSCLE SPASMS 20 tablet 0   EPINEPHrine (EPIPEN 2-PAK) 0.3 mg/0.3 mL IJ SOAJ injection Use as directed for life threatening allergic reactions 1 each 3   ezetimibe (ZETIA) 10 MG tablet Take 1 tablet (10 mg total) by mouth daily. 90 tablet 3   fexofenadine (ALLEGRA) 180 MG tablet Take 1 tablet (180 mg total) by mouth daily. 30 tablet 5   fish oil-omega-3 fatty acids 1000 MG capsule Take 1 g by mouth. Takes occasionally     fluticasone (FLONASE) 50 MCG/ACT nasal spray Place 1-2 sprays into both nostrils daily as needed for allergies or rhinitis. 48 mL 1   losartan (COZAAR) 100 MG tablet  TAKE 1 TABLET BY MOUTH EVERY DAY 90 tablet 0   metoprolol tartrate (LOPRESSOR) 50 MG tablet TAKE 1 TABLET BY MOUTH EVERY MORNING AND 1 AND 1/2 TABLET EVERY EVENING 225 tablet 1   Multiple Vitamin (MULTIVITAMIN) tablet Take 1 tablet by mouth. occasionally     nitroGLYCERIN (NITROSTAT) 0.4 MG SL tablet PLACE 1 TABLET UNDER THE TONGUE EVERY 5 MINUTES AS NEEDED FOR CHEST PAIN. 25 tablet 3   pantoprazole (PROTONIX) 40 MG tablet TAKE 1 TABLET BY MOUTH EVERY DAY 90 tablet 1   Wheat Dextrin (BENEFIBER PO) Take by mouth. Daily     zolpidem (AMBIEN) 10 MG tablet TAKE 1 TABLET BY MOUTH EVERY DAY AT BEDTIME AS NEEDED FOR SLEEP 30 tablet 3   No current facility-administered medications for this visit.   Allergies: Allergies  Allergen Reactions   Ciprofloxacin    Citalopram Hydrobromide    Escitalopram Oxalate    Paroxetine    Polysporin [Bacitracin-Polymyxin B]    I reviewed her past medical history, social history, family history, and environmental history and no significant changes have been reported from her previous visit.  Review of Systems  Constitutional:  Negative for appetite change, chills, fever and unexpected weight change.  HENT:  Negative for congestion and rhinorrhea.   Eyes:  Negative for itching.  Respiratory:  Negative for cough, chest tightness, shortness of breath and wheezing.   Cardiovascular:  Negative for chest pain.  Gastrointestinal:  Negative for abdominal pain.  Genitourinary:  Negative for difficulty urinating.  Skin:  Negative for rash.  Allergic/Immunologic: Positive for environmental allergies.  Neurological:  Negative for headaches.    Objective: There were no vitals taken for this visit. There is no height or weight on file to calculate BMI. Physical Exam Vitals and nursing note reviewed.  Constitutional:      Appearance: Normal appearance. She is well-developed.  HENT:     Head: Normocephalic and atraumatic.     Right Ear: Tympanic membrane and  external ear normal.     Left Ear: Tympanic membrane and external ear normal.     Nose: Nose normal.     Mouth/Throat:     Mouth: Mucous membranes are moist.     Pharynx: Oropharynx is clear.  Eyes:     Conjunctiva/sclera: Conjunctivae normal.  Cardiovascular:     Rate and Rhythm: Normal rate and regular rhythm.     Heart sounds: Normal heart sounds. No murmur heard.    No friction rub. No gallop.  Pulmonary:     Effort: Pulmonary effort is normal.     Breath sounds: Normal breath sounds. No wheezing, rhonchi or rales.  Musculoskeletal:     Cervical back: Neck supple.  Skin:    General: Skin is warm.     Findings: No rash.  Neurological:     Mental Status: She is alert and oriented to person, place, and time.  Psychiatric:        Behavior: Behavior normal.    Previous notes and tests were reviewed. The plan was reviewed with the patient/family, and all questions/concerned were addressed.  It was my pleasure to see Madeline Wood today and participate in her care. Please feel free to contact me with any questions or concerns.  Sincerely,  Wyline Mood, DO Allergy & Immunology  [facility] office: [phone] [facility] office: [phone]

## 2023-01-03 ENCOUNTER — Ambulatory Visit: Payer: 59 | Admitting: Allergy

## 2023-01-03 DIAGNOSIS — H1010 Acute atopic conjunctivitis, unspecified eye: Secondary | ICD-10-CM

## 2023-01-16 ENCOUNTER — Encounter: Payer: Self-pay | Admitting: Podiatry

## 2023-01-16 ENCOUNTER — Ambulatory Visit (INDEPENDENT_AMBULATORY_CARE_PROVIDER_SITE_OTHER): Payer: 59 | Admitting: Podiatry

## 2023-01-16 DIAGNOSIS — M79674 Pain in right toe(s): Secondary | ICD-10-CM

## 2023-01-16 DIAGNOSIS — B351 Tinea unguium: Secondary | ICD-10-CM | POA: Diagnosis not present

## 2023-01-16 DIAGNOSIS — E1151 Type 2 diabetes mellitus with diabetic peripheral angiopathy without gangrene: Secondary | ICD-10-CM

## 2023-01-16 DIAGNOSIS — M79675 Pain in left toe(s): Secondary | ICD-10-CM

## 2023-01-18 ENCOUNTER — Other Ambulatory Visit: Payer: Self-pay | Admitting: Family Medicine

## 2023-01-21 NOTE — Progress Notes (Signed)
  Subjective:  Patient ID: Madeline Wood, female    DOB: 13-Mar-1946,  MRN: [id number]  Madeline Wood presents to clinic today for at risk foot care. Pt has h/o NIDDM with PAD and painful thick toenails that are difficult to trim. Pain interferes with ambulation. Aggravating factors include wearing enclosed shoe gear. Pain is relieved with periodic professional debridement.  Chief Complaint  Patient presents with   Nail Problem    DFC BS-do not check A1C-do not know PCP-[facility] PCP VST-3 months ago   New problem(s): None.   PCP is Donita Brooks, MD.  Allergies  Allergen Reactions   Ciprofloxacin    Citalopram Hydrobromide    Escitalopram Oxalate    Paroxetine    Polysporin [Bacitracin-Polymyxin B]     Review of Systems: Negative except as noted in the HPI.  Objective:  There were no vitals filed for this visit. Madeline Wood is a pleasant 77 y.o. female in NAD. AAO x 3.  Vascular Examination: CFT <3 seconds b/l. DP/PT pulses faintly palpable b/l. Skin temperature gradient warm to warm b/l. No pain with calf compression. No ischemia or gangrene. No cyanosis or clubbing noted b/l.    Neurological Examination: Sensation grossly intact b/l with 10 gram monofilament. Vibratory sensation intact b/l.   Dermatological Examination: Pedal skin warm and supple b/l.   No open wounds. No interdigital macerations.  Toenails 1-5 b/l thick, discolored, elongated with subungual debris and pain on dorsal palpation.    Incurvated nailplate medial border left hallux and medial border right hallux.  Nail border hypertrophy absent. There is tenderness to palpation. Sign(s) of infection: no clinical signs of infection noted on examination today.. No hyperkeratotic nor porokeratotic lesions present on today's visit.  Musculoskeletal Examination: Muscle strength 5/5 to b/l LE. HAV with bunion deformity noted b/l LE. Hammertoe deformity noted 2-5 b/l.  Radiographs: None  Last A1c:       Latest Ref Rng & Units 03/23/2022   12:02 PM  Hemoglobin A1C  Hemoglobin-A1c <5.7 % of total Hgb 5.9    Assessment/Plan: 1. Pain due to onychomycosis of toenails of both feet   2. Type II diabetes mellitus with peripheral circulatory disorder [facility])    -Patient was evaluated and treated. All patient's and/or POA's questions/concerns answered on today's visit. -Continue diabetic shoes daily. -Toenails were debrided in length and girth 2-5 bilaterally with sterile nail nippers and dremel without iatrogenic bleeding.  -No invasive procedure(s) performed. Offending nail border debrided and curretaged medial border left hallux and medial border right hallux utilizing sterile nail nipper and currette. Border cleansed with alcohol. No further treatment required by patient/caregiver. Call office if there are any concerns. -Patient/POA to call should there be question/concern in the interim.   Return in about 3 months (around 04/18/2023).  Madeline Wood, [name]

## 2023-01-25 ENCOUNTER — Ambulatory Visit (INDEPENDENT_AMBULATORY_CARE_PROVIDER_SITE_OTHER): Payer: 59

## 2023-01-25 VITALS — Ht 66.0 in | Wt 175.0 lb

## 2023-01-25 DIAGNOSIS — Z0000 Encounter for general adult medical examination without abnormal findings: Secondary | ICD-10-CM | POA: Diagnosis not present

## 2023-01-25 NOTE — Progress Notes (Signed)
Subjective:   Madeline Wood is a 77 y.o. female who presents for Medicare Annual (Subsequent) preventive examination.  I connected with  Madeline Wood on 01/25/23 by a audio enabled telemedicine application and verified that I am speaking with the correct person using two identifiers.  Patient Location: Home  Provider Location: Home Office  I discussed the limitations of evaluation and management by telemedicine. The patient expressed understanding and agreed to proceed.  Review of Systems     Cardiac Risk Factors include: advanced age (>37men, >31 women);diabetes mellitus;dyslipidemia;hypertension;sedentary lifestyle     Objective:    Today's Vitals   01/25/23 0938  Weight: 175 lb (79.4 kg)  Height: 5\' 6"  (1.676 m)   Body mass index is 28.25 kg/m.     01/25/2023   10:32 AM 02/16/2022   10:15 AM 12/24/2020   10:39 AM 04/14/2020    8:14 PM 07/27/2017    7:35 PM  Advanced Directives  Does Patient Have a Medical Advance Directive? No Yes Yes No No;Yes  Type of Special educational needs teacher of [place];Living will Healthcare Power of [place];Living will  Healthcare Power of Attorney  Does patient want to make changes to medical advance directive?   No - Patient declined    Copy of Healthcare Power of Attorney in Chart?  No - copy requested No - copy requested    Would patient like information on creating a medical advance directive? Yes (MAU/Ambulatory/Procedural Areas - Information given)  No - Patient declined No - Patient declined No - Patient declined    Current Medications (verified) Outpatient Encounter Medications as of 01/25/2023  Medication Sig   albuterol (VENTOLIN HFA) 108 (90 Base) MCG/ACT inhaler TAKE 2 PUFFS INTO LUNGS EVERY 6 HOURS AS NEEDED FOR WHEEZE OR SHORTNESS OF BREATH   ALPRAZolam (XANAX) 1 MG tablet TAKE 1 TABLET BY MOUTH TWICE A DAY   amLODipine (NORVASC) 5 MG tablet Take 1 tablet (5 mg total) by mouth daily.   aspirin 81 MG tablet Take 81 mg  by mouth daily.   atorvastatin (LIPITOR) 80 MG tablet TAKE 1 TABLET BY MOUTH EVERY DAY   colchicine 0.6 MG tablet PLEASE SEE ATTACHED FOR DETAILED DIRECTIONS   cyclobenzaprine (FLEXERIL) 5 MG tablet TAKE 1 TABLET BY MOUTH THREE TIMES A DAY AS NEEDED FOR MUSCLE SPASMS   EPINEPHrine (EPIPEN 2-PAK) 0.3 mg/0.3 mL IJ SOAJ injection Use as directed for life threatening allergic reactions   ezetimibe (ZETIA) 10 MG tablet Take 1 tablet (10 mg total) by mouth daily.   fexofenadine (ALLEGRA) 180 MG tablet Take 1 tablet (180 mg total) by mouth daily.   fish oil-omega-3 fatty acids 1000 MG capsule Take 1 g by mouth. Takes occasionally   fluticasone (FLONASE) 50 MCG/ACT nasal spray Place 1-2 sprays into both nostrils daily as needed for allergies or rhinitis.   losartan (COZAAR) 100 MG tablet TAKE 1 TABLET BY MOUTH EVERY DAY   metoprolol tartrate (LOPRESSOR) 50 MG tablet TAKE 1 TABLET BY MOUTH EVERY MORNING AND 1 AND 1/2 TABLET EVERY EVENING   Multiple Vitamin (MULTIVITAMIN) tablet Take 1 tablet by mouth. occasionally   nitroGLYCERIN (NITROSTAT) 0.4 MG SL tablet PLACE 1 TABLET UNDER THE TONGUE EVERY 5 MINUTES AS NEEDED FOR CHEST PAIN.   pantoprazole (PROTONIX) 40 MG tablet TAKE 1 TABLET BY MOUTH EVERY DAY   Wheat Dextrin (BENEFIBER PO) Take by mouth. Daily   zolpidem (AMBIEN) 10 MG tablet TAKE 1 TABLET BY MOUTH EVERY DAY AT BEDTIME AS NEEDED FOR  SLEEP   benzonatate (TESSALON) 200 MG capsule Take 1 capsule (200 mg total) by mouth 2 (two) times daily as needed for cough. (Patient not taking: Reported on 01/25/2023)   [DISCONTINUED] omeprazole (PRILOSEC OTC) 20 MG tablet Take 1 tablet (20 mg total) by mouth daily.   No facility-administered encounter medications on file as of 01/25/2023.    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    Angio-edema    Anxiety    Arthritis    Blood transfusion without reported diagnosis    1985 with hysterectomy    CAD (coronary artery disease)    stent of distal RCA in 2006   Diabetes mellitus without complication (HCC)    diet controlled-    Diverticulosis    GERD (gastroesophageal reflux disease)    Gout    H/O heart artery stent    Hiatal hernia    History of nuclear stress test 03/2011   negative lexiscan myoview; normal perfusion   Hyperlipidemia    Hypertension    Knee pain    Neuromuscular disorder (HCC)    Hiatal Hernia    Obesity    Positive TB test    Venous insufficiency    Past Surgical History:  Procedure Laterality Date   ABDOMINAL HYSTERECTOMY     BACK SURGERY  2012   BREAST LUMPECTOMY     COLONOSCOPY  2011   CORONARY ANGIOPLASTY WITH STENT PLACEMENT  06/29/2005   Taxus 2.5x74mm DES to distal RCA (Dr. Jenne Campus)   TRANSTHORACIC ECHOCARDIOGRAM  12/20/2012   EF 50-55%, normal systolic function, mild hypokinesis of inf myocardium; calcified MV annulua, LA & RA mildly dilated   Family History  Problem Relation Age of Onset   Ovarian cancer Mother    Other Father        homicide   Diabetes Sister    Hypertension Sister    Pancreatic cancer Brother    Heart attack Brother        x2   Hypertension Brother    Esophageal cancer Brother    Prostate cancer Brother    Colon cancer Neg Hx    Colon polyps Neg Hx    Rectal cancer Neg Hx    Stomach cancer Neg Hx    Social History   Socioeconomic History   Marital status: Single    Spouse name: Not on file   Number of children: 1   Years of education: Not on file   Highest education level: Not on file  Occupational History   Occupation: Field seismologist  Tobacco Use   Smoking status: Former    Types: Cigarettes    Quit date: 12/20/2001    Years since quitting: 21.1   Smokeless tobacco: Never  Vaping Use   Vaping Use: Never used   Substance and Sexual Activity   Alcohol use: No    Alcohol/week: 0.0 standard drinks of alcohol   Drug use: No   Sexual activity: Not on file  Other Topics Concern   Not on file  Social History Narrative   Not on file   Social Determinants of Health   Financial Resource Strain: Low Risk  (01/25/2023)   Overall Financial Resource Strain (CARDIA)    Difficulty of Paying Living Expenses: Not hard at all  Food Insecurity: No Food Insecurity (01/25/2023)  Hunger Vital Sign    Worried About Running Out of Food in the Last Year: Never true    Ran Out of Food in the Last Year: Never true  Transportation Needs: No Transportation Needs (01/25/2023)   PRAPARE - Administrator, Civil Service (Medical): No    Lack of Transportation (Non-Medical): No  Physical Activity: Insufficiently Active (01/25/2023)   Exercise Vital Sign    Days of Exercise per Week: 3 days    Minutes of Exercise per Session: 30 min  Stress: No Stress Concern Present (01/25/2023)   [employer] of Occupational Health - Occupational Stress Questionnaire    Feeling of Stress : Not at all  Social Connections: Moderately Integrated (01/25/2023)   Social Connection and Isolation Panel [NHANES]    Frequency of Communication with Friends and Family: More than three times a week    Frequency of Social Gatherings with Friends and Family: Three times a week    Attends Religious Services: More than 4 times per year    Active Member of Clubs or Organizations: Yes    Attends Engineer, structural: More than 4 times per year    Marital Status: Never married    Tobacco Counseling Counseling given: Not Answered   Clinical Intake:  Pre-visit preparation completed: Yes  Pain : No/denies pain   Diabetes: Yes CBG done?: No Did pt. bring in CBG monitor from home?: No  How often do you need to have someone help you when you read instructions, pamphlets, or other written materials from your doctor or  pharmacy?: 2 - Rarely  Diabetic?Yes   Nutrition Risk Assessment:  Has the patient had any N/V/D within the last 2 months?  No  Does the patient have any non-healing wounds?  No  Has the patient had any unintentional weight loss or weight gain?  No   Diabetes:  Is the patient diabetic?  Yes  If diabetic, was a CBG obtained today?  No  Did the patient bring in their glucometer from home?  No  How often do you monitor your CBG's? daily.   Financial Strains and Diabetes Management:  Are you having any financial strains with the device, your supplies or your medication? No .  Does the patient want to be seen by Chronic Care Management for management of their diabetes?  No  Would the patient like to be referred to a Nutritionist or for Diabetic Management?  No   Diabetic Exams:  Diabetic Eye Exam: Completed records requested Diabetic Foot Exam: Completed 06/02/22   Interpreter Needed?: No  Information entered by :: Kandis Fantasia LPN   Activities of Daily Living    01/25/2023   10:32 AM 02/16/2022   10:16 AM  In your present state of health, do you have any difficulty performing the following activities:  Hearing? 0 0  Vision? 0 0  Difficulty concentrating or making decisions? 0 0  Walking or climbing stairs? 0 0  Dressing or bathing? 0 0  Doing errands, shopping? 0 0  Preparing Food and eating ? N N  Using the Toilet? N N  In the past six months, have you accidently leaked urine? N N  Do you have problems with loss of bowel control? N N  Managing your Medications? N N  Managing your Finances? N N  Housekeeping or managing your Housekeeping? N N    Patient Care Team: Donita Brooks, MD as PCP - General (Family Medicine) Rennis Golden Lisette Abu, MD as PCP -  Cardiology (Cardiology) Donita Brooks, MD (Family Medicine) Erroll Luna, [facility] as Pharmacist (Pharmacist) Ob/Gyn, [facility], [name] as Consulting Physician (Podiatry) Dellis Anes Hetty Ely, MD as Consulting Physician (Allergy and Immunology) [facility] Candice Camp, MD as Consulting Physician (Obstetrics and Gynecology)  Indicate any recent Medical Services you may have received from other than [facility] providers in the past year (date may be approximate).     Assessment:   This is a routine wellness examination for Madeline Wood.  Hearing/Vision screen Hearing Screening - Comments:: Denies hearing difficulties   Vision Screening - Comments:: Wears rx glasses - up to date with routine eye exams with [facility]    Dietary issues and exercise activities discussed: Current Exercise Habits: Home exercise routine, Type of exercise: walking, Time (Minutes): 30, Frequency (Times/Week): 3, Weekly Exercise (Minutes/Week): 90, Intensity: Mild   Goals Addressed             This Visit's Progress    COMPLETED: Lifestyle Change-Hypertension       Timeframe:  Long-Range Goal Priority:  High Start Date:      12/29/20                       Expected End Date:    07/01/21                   Follow Up Date 03/31/21   - agree to work together to make changes - learn about high blood pressure    Why is this important?   The changes that you are asked to make may be hard to do.  This is especially true when the changes are life-long.  Knowing why it is important to you is the first step.  Working on the change with your family or support person helps you not feel alone.  Reward yourself and family or support person when goals are met. This can be an activity you choose like bowling, hiking, biking, swimming or shooting hoops.     Notes: Implement physical activity plan with target 150 minutes per week!!     Remain active and independent         Depression Screen    01/25/2023   10:33 AM 02/16/2022   10:11 AM 12/24/2020   10:37 AM 10/15/2018   10:15 AM 02/27/2018   11:41 AM 11/27/2016   12:24 PM 11/13/2016   11:27 AM  PHQ 2/9 Scores  PHQ - 2 Score 0 0  0 0 0 0 0  PHQ- 9 Score      0 0    Fall Risk    01/25/2023   10:32 AM 02/16/2022   10:15 AM 12/24/2020   10:37 AM 10/15/2018   10:15 AM 02/27/2018   11:41 AM  Fall Risk   Falls in the past year? 0 0 0 1 No  Number falls in past yr: 0 0  1   Injury with Fall? 0 0  1   Risk for fall due to : No Fall Risks No Fall Risks No Fall Risks    Follow up Falls prevention discussed;Education provided;Falls evaluation completed Falls prevention discussed Falls evaluation completed Falls evaluation completed     FALL RISK PREVENTION PERTAINING TO THE HOME:  Any stairs in or around the home? No  If so, are there any without handrails? No  Home free of loose throw rugs in walkways, pet beds, electrical cords, etc? Yes  Adequate lighting  in your home to reduce risk of falls? Yes   ASSISTIVE DEVICES UTILIZED TO PREVENT FALLS:  Life alert? No  Use of a cane, walker or w/c? No  Grab bars in the bathroom? Yes  Shower chair or bench in shower? No  Elevated toilet seat or a handicapped toilet? Yes   TIMED UP AND GO:  Was the test performed? No . Telephonic visit   Cognitive Function:        01/25/2023   10:32 AM 02/16/2022   10:17 AM  6CIT Screen  What Year? 0 points 0 points  What month? 0 points 0 points  What time? 0 points 0 points  Count back from 20 0 points 0 points  Months in reverse 0 points 0 points  Repeat phrase 4 points 4 points  Total Score 4 points 4 points    Immunizations Immunization History  Administered Date(s) Administered   Fluad Quad(high Dose 65+) 05/20/2019, 07/20/2021, 07/31/2022   H1N1 08/03/2008   Influenza Whole 05/28/2008, 07/07/2011   Influenza, High Dose Seasonal PF 06/04/2017, 05/29/2018, 07/14/2020   Influenza,inj,Quad PF,6+ Mos 05/28/2013, 07/02/2014, 06/01/2015, 05/24/2016   Influenza-Unspecified 06/09/2008, 06/03/2009, 05/19/2010   Pfizer Covid-19 Vaccine Bivalent Booster 74yrs & up 09/08/2021   Pneumococcal Conjugate-13 08/18/2013    Pneumococcal Polysaccharide-23 02/25/2014   Td 05/13/2007, 01/25/2012   Tdap 01/25/2012   Unspecified SARS-COV-2 Vaccination 10/20/2019, 10/31/2019, 06/28/2020   Zoster, Live 07/02/2014    TDAP status: Due, Education has been provided regarding the importance of this vaccine. Advised may receive this vaccine at local pharmacy or Health Dept. Aware to provide a copy of the vaccination record if obtained from local pharmacy or Health Dept. Verbalized acceptance and understanding.  Pneumococcal vaccine status: Up to date  Covid-19 vaccine status: Information provided on how to obtain vaccines.   Qualifies for Shingles Vaccine? Yes   Zostavax completed Yes   Shingrix Completed?: No.    Education has been provided regarding the importance of this vaccine. Patient has been advised to call insurance company to determine out of pocket expense if they have not yet received this vaccine. Advised may also receive vaccine at local pharmacy or Health Dept. Verbalized acceptance and understanding.  Screening Tests Health Maintenance  Topic Date Due   Zoster Vaccines- Shingrix (1 of 2) Never done   OPHTHALMOLOGY EXAM  07/14/2021   Diabetic kidney evaluation - Urine ACR  12/20/2021   DTaP/Tdap/Td (4 - Td or Tdap) 01/24/2022   COVID-19 Vaccine (5 - 2023-24 season) 04/28/2022   HEMOGLOBIN A1C  09/23/2022   INFLUENZA VACCINE  03/29/2023   Diabetic kidney evaluation - eGFR measurement  04/01/2023   FOOT EXAM  06/03/2023   Medicare Annual Wellness (AWV)  01/25/2024   Pneumonia Vaccine 86+ Years old  Completed   DEXA SCAN  Completed   Hepatitis C Screening  Completed   HPV VACCINES  Aged Out   Colonoscopy  Discontinued    Health Maintenance  Health Maintenance Due  Topic Date Due   Zoster Vaccines- Shingrix (1 of 2) Never done   OPHTHALMOLOGY EXAM  07/14/2021   Diabetic kidney evaluation - Urine ACR  12/20/2021   DTaP/Tdap/Td (4 - Td or Tdap) 01/24/2022   COVID-19 Vaccine (5 - 2023-24 season)  04/28/2022   HEMOGLOBIN A1C  09/23/2022    Colorectal cancer screening: No longer required.   Mammogram status: No longer required due to age and preference.  Bone Density status: Completed 06/18/20. Results reflect: Bone density results: NORMAL. Repeat every 5 years.  Lung Cancer Screening: (Low Dose CT Chest recommended if Age 68-80 years, 30 pack-year currently smoking OR have quit w/in 15years.) does not qualify.   Lung Cancer Screening Referral: n/a  Additional Screening:  Hepatitis C Screening: does qualify; Completed 05/20/19  Vision Screening: Recommended annual ophthalmology exams for early detection of glaucoma and other disorders of the eye. Is the patient up to date with their annual eye exam?  Yes  Who is the provider or what is the name of the office in which the patient attends annual eye exams? [facility] If pt is not established with a provider, would they like to be referred to a provider to establish care? No .   Dental Screening: Recommended annual dental exams for proper oral hygiene  Community Resource Referral / Chronic Care Management: CRR required this visit?  No   CCM required this visit?  No      Plan:     I have personally reviewed and noted the following in the patient's chart:   Medical and social history Use of alcohol, tobacco or illicit drugs  Current medications and supplements including opioid prescriptions. Patient is not currently taking opioid prescriptions. Functional ability and status Nutritional status Physical activity Advanced directives List of other physicians Hospitalizations, surgeries, and ER visits in previous 12 months Vitals Screenings to include cognitive, depression, and falls Referrals and appointments  In addition, I have reviewed and discussed with patient certain preventive protocols, quality metrics, and best practice recommendations. A written personalized care plan for preventive services as  well as general preventive health recommendations were provided to patient.     Durwin Nora, [place]   1/61/0960   Due to this being a virtual visit, the after visit summary with patients personalized plan was offered to patient via mail or my-chart. per request, patient was mailed a copy of AVS  Nurse Notes: No concerns

## 2023-01-25 NOTE — Patient Instructions (Signed)
Madeline Wood , Thank you for taking time to come for your Medicare Wellness Visit. I appreciate your ongoing commitment to your health goals. Please review the following plan we discussed and let me know if I can assist you in the future.   These are the goals we discussed:  Goals      Exercise 3x per week (30 min per time)     Remain active and independent        This is a list of the screening recommended for you and due dates:  Health Maintenance  Topic Date Due   Zoster (Shingles) Vaccine (1 of 2) Never done   Eye exam for diabetics  07/14/2021   Yearly kidney health urinalysis for diabetes  12/20/2021   DTaP/Tdap/Td vaccine (4 - Td or Tdap) 01/24/2022   COVID-19 Vaccine (5 - 2023-24 season) 04/28/2022   Hemoglobin A1C  09/23/2022   Flu Shot  03/29/2023   Yearly kidney function blood test for diabetes  04/01/2023   Complete foot exam   06/03/2023   Medicare Annual Wellness Visit  01/25/2024   Pneumonia Vaccine  Completed   DEXA scan (bone density measurement)  Completed   Hepatitis C Screening  Completed   HPV Vaccine  Aged Out   Colon Cancer Screening  Discontinued    Advanced directives: Information on Advanced Care Planning can be found at [facility] Advance Health Care Directives Advance Health Care Directives ([email or web address] Please bring a copy of your health care power of attorney and living will to the office to be added to your chart at your convenience.  Conditions/risks identified: Aim for 30 minutes of exercise or brisk walking, 6-8 glasses of water, and 5 servings of fruits and vegetables each day.  Next appointment: Follow up in one year for your annual wellness visit    Preventive Care 65 Years and Older, Female Preventive care refers to lifestyle choices and visits with your health care provider that can promote health and wellness. What does preventive care include? A yearly physical exam. This is also called an annual well check. Dental  exams once or twice a year. Routine eye exams. Ask your health care provider how often you should have your eyes checked. Personal lifestyle choices, including: Daily care of your teeth and gums. Regular physical activity. Eating a healthy diet. Avoiding tobacco and drug use. Limiting alcohol use. Practicing safe sex. Taking low-dose aspirin every day. Taking vitamin and mineral supplements as recommended by your health care provider. What happens during an annual well check? The services and screenings done by your health care provider during your annual well check will depend on your age, overall health, lifestyle risk factors, and family history of disease. Counseling  Your health care provider may ask you questions about your: Alcohol use. Tobacco use. Drug use. Emotional well-being. Home and relationship well-being. Sexual activity. Eating habits. History of falls. Memory and ability to understand (cognition). Work and work Astronomer. Reproductive health. Screening  You may have the following tests or measurements: Height, weight, and BMI. Blood pressure. Lipid and cholesterol levels. These may be checked every 5 years, or more frequently if you are over 62 years old. Skin check. Lung cancer screening. You may have this screening every year starting at age 43 if you have a 30-pack-year history of smoking and currently smoke or have quit within the past 15 years. Fecal occult blood test (FOBT) of the stool. You may have this test every year starting at  age 51. Flexible sigmoidoscopy or colonoscopy. You may have a sigmoidoscopy every 5 years or a colonoscopy every 10 years starting at age 33. Hepatitis C blood test. Hepatitis B blood test. Sexually transmitted disease (STD) testing. Diabetes screening. This is done by checking your blood sugar (glucose) after you have not eaten for a while (fasting). You may have this done every 1-3 years. Bone density scan. This is done  to screen for osteoporosis. You may have this done starting at age 31. Mammogram. This may be done every 1-2 years. Talk to your health care provider about how often you should have regular mammograms. Talk with your health care provider about your test results, treatment options, and if necessary, the need for more tests. Vaccines  Your health care provider may recommend certain vaccines, such as: Influenza vaccine. This is recommended every year. Tetanus, diphtheria, and acellular pertussis (Tdap, Td) vaccine. You may need a Td booster every 10 years. Zoster vaccine. You may need this after age 19. Pneumococcal 13-valent conjugate (PCV13) vaccine. One dose is recommended after age 13. Pneumococcal polysaccharide (PPSV23) vaccine. One dose is recommended after age 57. Talk to your health care provider about which screenings and vaccines you need and how often you need them. This information is not intended to replace advice given to you by your health care provider. Make sure you discuss any questions you have with your health care provider. Document Released: 09/10/2015 Document Revised: 05/03/2016 Document Reviewed: 06/15/2015 Elsevier Interactive Patient Education  2017 [employer].  Fall Prevention in the Home Falls can cause injuries. They can happen to people of all ages. There are many things you can do to make your home safe and to help prevent falls. What can I do on the outside of my home? Regularly fix the edges of walkways and driveways and fix any cracks. Remove anything that might make you trip as you walk through a door, such as a raised step or threshold. Trim any bushes or trees on the path to your home. Use bright outdoor lighting. Clear any walking paths of anything that might make someone trip, such as rocks or tools. Regularly check to see if handrails are loose or broken. Make sure that both sides of any steps have handrails. Any raised decks and porches should have  guardrails on the edges. Have any leaves, snow, or ice cleared regularly. Use sand or salt on walking paths during winter. Clean up any spills in your garage right away. This includes oil or grease spills. What can I do in the bathroom? Use night lights. Install grab bars by the toilet and in the tub and shower. Do not use towel bars as grab bars. Use non-skid mats or decals in the tub or shower. If you need to sit down in the shower, use a plastic, non-slip stool. Keep the floor dry. Clean up any water that spills on the floor as soon as it happens. Remove soap buildup in the tub or shower regularly. Attach bath mats securely with double-sided non-slip rug tape. Do not have throw rugs and other things on the floor that can make you trip. What can I do in the bedroom? Use night lights. Make sure that you have a light by your bed that is easy to reach. Do not use any sheets or blankets that are too big for your bed. They should not hang down onto the floor. Have a firm chair that has side arms. You can use this for support while  you get dressed. Do not have throw rugs and other things on the floor that can make you trip. What can I do in the kitchen? Clean up any spills right away. Avoid walking on wet floors. Keep items that you use a lot in easy-to-reach places. If you need to reach something above you, use a strong step stool that has a grab bar. Keep electrical cords out of the way. Do not use floor polish or wax that makes floors slippery. If you must use wax, use non-skid floor wax. Do not have throw rugs and other things on the floor that can make you trip. What can I do with my stairs? Do not leave any items on the stairs. Make sure that there are handrails on both sides of the stairs and use them. Fix handrails that are broken or loose. Make sure that handrails are as long as the stairways. Check any carpeting to make sure that it is firmly attached to the stairs. Fix any carpet  that is loose or worn. Avoid having throw rugs at the top or bottom of the stairs. If you do have throw rugs, attach them to the floor with carpet tape. Make sure that you have a light switch at the top of the stairs and the bottom of the stairs. If you do not have them, ask someone to add them for you. What else can I do to help prevent falls? Wear shoes that: Do not have high heels. Have rubber bottoms. Are comfortable and fit you well. Are closed at the toe. Do not wear sandals. If you use a stepladder: Make sure that it is fully opened. Do not climb a closed stepladder. Make sure that both sides of the stepladder are locked into place. Ask someone to hold it for you, if possible. Clearly mark and make sure that you can see: Any grab bars or handrails. First and last steps. Where the edge of each step is. Use tools that help you move around (mobility aids) if they are needed. These include: Canes. Walkers. Scooters. Crutches. Turn on the lights when you go into a dark area. Replace any light bulbs as soon as they burn out. Set up your furniture so you have a clear path. Avoid moving your furniture around. If any of your floors are uneven, fix them. If there are any pets around you, be aware of where they are. Review your medicines with your doctor. Some medicines can make you feel dizzy. This can increase your chance of falling. Ask your doctor what other things that you can do to help prevent falls. This information is not intended to replace advice given to you by your health care provider. Make sure you discuss any questions you have with your health care provider. Document Released: 06/10/2009 Document Revised: 01/20/2016 Document Reviewed: 09/18/2014 Elsevier Interactive Patient Education  2017 [employer].

## 2023-01-31 ENCOUNTER — Ambulatory Visit: Payer: 59 | Admitting: Podiatry

## 2023-02-12 ENCOUNTER — Other Ambulatory Visit: Payer: Self-pay | Admitting: Family Medicine

## 2023-02-12 ENCOUNTER — Telehealth: Payer: Self-pay

## 2023-02-12 NOTE — Telephone Encounter (Signed)
Prescription Request  02/12/2023  LOV: 08/30/22  What is the name of the medication or equipment? amLODipine (NORVASC) 5 MG tablet [[id number]]  Have you contacted your pharmacy to request a refill? Yes   Which pharmacy would you like this sent to?  [id number] [place], [place] - [address] [facility] [place] [address] Phone: [phone] Fax: [phone]    Patient notified that their request is being sent to the clinical staff for review and that they should receive a response within 2 business days.   Please advise at [facility] [phone]  Prescription Request  02/12/2023  LOV: 08/30/22  What is the name of the medication or equipment? losartan (COZAAR) 100 MG tablet [[id number]]  Have you contacted your pharmacy to request a refill? Yes   Which pharmacy would you like this sent to?  [id number] [place], [place] - [address] [facility] [address] AT [facility] [address] [place] [address] Phone: [phone] Fax: [phone]    Patient notified that their request is being sent to the clinical staff for review and that they should receive a response within 2 business days.   Please advise at [facility] [phone]

## 2023-02-13 ENCOUNTER — Other Ambulatory Visit: Payer: Self-pay | Admitting: Family Medicine

## 2023-02-13 MED ORDER — AMLODIPINE BESYLATE 5 MG PO TABS: 5.0000 mg | ORAL_TABLET | Freq: Every day | ORAL | 0 refills | Status: DC

## 2023-03-08 ENCOUNTER — Other Ambulatory Visit: Payer: Self-pay | Admitting: Family Medicine

## 2023-03-19 ENCOUNTER — Other Ambulatory Visit: Payer: Self-pay

## 2023-03-19 ENCOUNTER — Encounter: Payer: Self-pay | Admitting: Family Medicine

## 2023-03-19 ENCOUNTER — Ambulatory Visit: Payer: 59 | Admitting: Family Medicine

## 2023-03-19 VITALS — BP 122/60 | HR 77 | Temp 97.8°F | Resp 16 | Wt 178.6 lb

## 2023-03-19 DIAGNOSIS — H1013 Acute atopic conjunctivitis, bilateral: Secondary | ICD-10-CM | POA: Diagnosis not present

## 2023-03-19 DIAGNOSIS — H1010 Acute atopic conjunctivitis, unspecified eye: Secondary | ICD-10-CM

## 2023-03-19 DIAGNOSIS — J302 Other seasonal allergic rhinitis: Secondary | ICD-10-CM | POA: Diagnosis not present

## 2023-03-19 DIAGNOSIS — J3089 Other allergic rhinitis: Secondary | ICD-10-CM

## 2023-03-19 NOTE — Progress Notes (Signed)
[address] [place] [address] Dept: [phone]  FOLLOW UP NOTE  Patient ID: Madeline Wood, female    DOB: 05-Apr-1946  Age: 77 y.o. MRN: [id number] Date of Office Visit: 03/19/2023  Assessment  Chief Complaint: Follow-up (No complaints or concerns at this time. )  HPI Madeline Wood is a 77 year old female who presents to the clinic for follow-up visit.  She was last seen in this clinic on 12/03/2021 by Thermon Leyland, FNP, for evaluation of allergic rhinitis and allergic conjunctivitis.    At today's visit, she reports her allergic rhinitis has been moderately well-controlled with occasional postnasal drainage as the main symptom.  She reports that she uses medications as needed including Allegra, Flonase, and saline nasal rinses.  She continues allergen immunotherapy directed toward grass pollen, weed pollen, tree pollen, mold, dust mite, cat, and cockroach with her last injection about 2 months ago.  She began allergen immunotherapy before 2016 and then restarted allergen immunotherapy in 2020.  She is interested in stopping allergen immunotherapy at today's visit as she feels as though her symptoms are well-controlled and she is requesting to stop allergen immunotherapy at this time.  Allergic conjunctivitis is reported as well-controlled with no symptoms including red or itchy eyes.  She is not currently using an allergy eyedrop.  Her current medications are listed in the chart.  Drug Allergies:  Allergies  Allergen Reactions   Ciprofloxacin    Citalopram Hydrobromide    Escitalopram Oxalate    Paroxetine    Polysporin [Bacitracin-Polymyxin B]     Physical Exam: BP 122/60   Pulse 77   Temp 97.8 F (36.6 C) (Temporal)   Resp 16   Wt 178 lb 9.6 oz (81 kg)   SpO2 97%   BMI 28.83 kg/m    Physical Exam Vitals reviewed.  Constitutional:      Appearance: Normal appearance.  HENT:     Head: Normocephalic and atraumatic.     Right Ear: Tympanic membrane normal.      Left Ear: Tympanic membrane normal.     Nose:     Comments: Bilateral nares slightly erythematous with thin clear nasal drainage noted.  Pharynx normal.  Ears normal.  Eyes normal.    Mouth/Throat:     Pharynx: Oropharynx is clear.  Eyes:     Conjunctiva/sclera: Conjunctivae normal.  Cardiovascular:     Rate and Rhythm: Normal rate and regular rhythm.     Heart sounds: Normal heart sounds. No murmur heard. Pulmonary:     Effort: Pulmonary effort is normal.     Breath sounds: Normal breath sounds.     Comments: Lungs clear to auscultation Musculoskeletal:        General: Normal range of motion.     Cervical back: Normal range of motion and neck supple.  Skin:    General: Skin is warm and dry.  Neurological:     Mental Status: She is alert and oriented to person, place, and time.  Psychiatric:        Mood and Affect: Mood normal.        Behavior: Behavior normal.        Thought Content: Thought content normal.        Judgment: Judgment normal.     Assessment and Plan: 1. Seasonal and perennial allergic rhinitis   2. Seasonal allergic conjunctivitis      Patient Instructions  Allergic rhinitis Continue allergen avoidance measures directed toward pollens, dust mite, mold, cat, and cockroach  as listed below Patient is interested in stopping her injections at this time.  Continue an antihistamine once a day as needed for runny nose or itch. Remember to rotate to a different antihistamine about every 3 months. Some examples of over the counter antihistamines include Zyrtec (cetirizine), Xyzal (levocetirizine), Allegra (fexofenadine), and Claritin (loratidine).  Continue Flonase 1 to 2 sprays in each nostril once a day as needed for a stuffy nose Consider saline nasal rinses as needed for nasal symptoms. Use this before any medicated nasal sprays for best result  Allergic conjunctivitis Some over the counter eye drops include Pataday one drop in each eye once a day as needed for  red, itchy eyes OR Zaditor one drop in each eye twice a day as needed for red itchy eyes.   Call the clinic if this treatment plan is not working well for you  Follow up in 1 year or sooner if needed.   Return in about 1 year (around 03/18/2024), or if symptoms worsen or fail to improve.    Thank you for the opportunity to care for this patient.  Please do not hesitate to contact me with questions.  Thermon Leyland, FNP [facility]

## 2023-03-19 NOTE — Patient Instructions (Addendum)
Allergic rhinitis Continue allergen avoidance measures directed toward pollens, dust mite, mold, cat, and cockroach as listed below Patient is interested in stopping her injections at this time.  Continue an antihistamine once a day as needed for runny nose or itch. Remember to rotate to a different antihistamine about every 3 months. Some examples of over the counter antihistamines include Zyrtec (cetirizine), Xyzal (levocetirizine), Allegra (fexofenadine), and Claritin (loratidine).  Continue Flonase 1 to 2 sprays in each nostril once a day as needed for a stuffy nose Consider saline nasal rinses as needed for nasal symptoms. Use this before any medicated nasal sprays for best result  Allergic conjunctivitis Some over the counter eye drops include Pataday one drop in each eye once a day as needed for red, itchy eyes OR Zaditor one drop in each eye twice a day as needed for red itchy eyes.   Call the clinic if this treatment plan is not working well for you  Follow up in 1 year or sooner if needed.  Reducing Pollen Exposure The American Academy of Allergy, Asthma and Immunology suggests the following steps to reduce your exposure to pollen during allergy seasons. Do not hang sheets or clothing out to dry; pollen may collect on these items. Do not mow lawns or spend time around freshly cut grass; mowing stirs up pollen. Keep windows closed at night.  Keep car windows closed while driving. Minimize morning activities outdoors, a time when pollen counts are usually at their highest. Stay indoors as much as possible when pollen counts or humidity is high and on windy days when pollen tends to remain in the air longer. Use air conditioning when possible.  Many air conditioners have filters that trap the pollen spores. Use a HEPA room air filter to remove pollen form the indoor air you breathe.  Control of Mold Allergen Mold and fungi can grow on a variety of surfaces provided certain  temperature and moisture conditions exist.  Outdoor molds grow on plants, decaying vegetation and soil.  The major outdoor mold, Alternaria and Cladosporium, are found in very high numbers during hot and dry conditions.  Generally, a late Summer - Fall peak is seen for common outdoor fungal spores.  Rain will temporarily lower outdoor mold spore count, but counts rise rapidly when the rainy period ends.  The most important indoor molds are Aspergillus and Penicillium.  Dark, humid and poorly ventilated basements are ideal sites for mold growth.  The next most common sites of mold growth are the bathroom and the kitchen.  Outdoor [employer] Use air conditioning and keep windows closed Avoid exposure to decaying vegetation. Avoid leaf raking. Avoid grain handling. Consider wearing a face mask if working in moldy areas.  Indoor Mold Control Maintain humidity below 50%. Clean washable surfaces with 5% bleach solution. Remove sources e.g. Contaminated carpets.  Control of Dog or Cat Allergen Avoidance is the best way to manage a dog or cat allergy. If you have a dog or cat and are allergic to dog or cats, consider removing the dog or cat from the home. If you have a dog or cat but don't want to find it a new home, or if your family wants a pet even though someone in the household is allergic, here are some strategies that may help keep symptoms at bay:  Keep the pet out of your bedroom and restrict it to only a few rooms. Be advised that keeping the dog or cat in only one room will  not limit the allergens to that room. Don't pet, hug or kiss the dog or cat; if you do, wash your hands with soap and water. High-efficiency particulate air (HEPA) cleaners run continuously in a bedroom or living room can reduce allergen levels over time. Regular use of a high-efficiency vacuum cleaner or a central vacuum can reduce allergen levels. Giving your dog or cat a bath at least once a week can reduce airborne  allergen.   Control of Dust Mite Allergen Dust mites play a major role in allergic asthma and rhinitis. They occur in environments with high humidity wherever human skin is found. Dust mites absorb humidity from the atmosphere (ie, they do not drink) and feed on organic matter (including shed human and animal skin). Dust mites are a microscopic type of insect that you cannot see with the naked eye. High levels of dust mites have been detected from mattresses, pillows, carpets, upholstered furniture, bed covers, clothes, soft toys and any woven material. The principal allergen of the dust mite is found in its feces. A gram of dust may contain 1,000 mites and 250,000 fecal particles. Mite antigen is easily measured in the air during house cleaning activities. Dust mites do not bite and do not cause harm to humans, other than by triggering allergies/asthma.  Ways to decrease your exposure to dust mites in your home:  1. Encase mattresses, box springs and pillows with a mite-impermeable barrier or cover  2. Wash sheets, blankets and drapes weekly in hot water (130 F) with detergent and dry them in a dryer on the hot setting.  3. Have the room cleaned frequently with a vacuum cleaner and a damp dust-mop. For carpeting or rugs, vacuuming with a vacuum cleaner equipped with a high-efficiency particulate air (HEPA) filter. The dust mite allergic individual should not be in a room which is being cleaned and should wait 1 hour after cleaning before going into the room.  4. Do not sleep on upholstered furniture (eg, couches).  5. If possible removing carpeting, upholstered furniture and drapery from the home is ideal. Horizontal blinds should be eliminated in the rooms where the person spends the most time (bedroom, study, television room). Washable vinyl, roller-type shades are optimal.  6. Remove all non-washable stuffed toys from the bedroom. Wash stuffed toys weekly like sheets and blankets above.  7.  Reduce indoor humidity to less than 50%. Inexpensive humidity monitors can be purchased at most hardware stores. Do not use a humidifier as can make the problem worse and are not recommended.  Control of Cockroach Allergen Cockroach allergen has been identified as an important cause of acute attacks of asthma, especially in urban settings.  There are fifty-five species of cockroach that exist in the [place], however only three, the [place], [place] species produce allergen that can affect patients with Asthma.  Allergens can be obtained from fecal particles, egg casings and secretions from cockroaches.    Remove food sources. Reduce access to water. Seal access and entry points. Spray runways with 0.5-1% Diazinon or Chlorpyrifos Blow boric acid power under stoves and refrigerator. Place bait stations (hydramethylnon) at feeding sites.

## 2023-03-21 ENCOUNTER — Ambulatory Visit (INDEPENDENT_AMBULATORY_CARE_PROVIDER_SITE_OTHER): Payer: 59 | Admitting: Podiatry

## 2023-03-21 ENCOUNTER — Encounter: Payer: Self-pay | Admitting: Podiatry

## 2023-03-21 DIAGNOSIS — I739 Peripheral vascular disease, unspecified: Secondary | ICD-10-CM

## 2023-03-21 DIAGNOSIS — M79675 Pain in left toe(s): Secondary | ICD-10-CM

## 2023-03-21 DIAGNOSIS — B351 Tinea unguium: Secondary | ICD-10-CM | POA: Diagnosis not present

## 2023-03-21 DIAGNOSIS — E1151 Type 2 diabetes mellitus with diabetic peripheral angiopathy without gangrene: Secondary | ICD-10-CM | POA: Diagnosis not present

## 2023-03-21 DIAGNOSIS — M79674 Pain in right toe(s): Secondary | ICD-10-CM

## 2023-03-21 NOTE — Progress Notes (Signed)
  Subjective:  Patient ID: Madeline Wood, female    DOB: 11-Nov-1945,   MRN: [id number]  Chief Complaint  Patient presents with   Nail Problem    Pt came in today for a diabetic ft care. Pt denies pain    77 y.o. female presents for concern of thickened elongated and painful nails that are difficult to trim. Requesting to have them trimmed today. Relates burning and tingling in their feet. Patient is diabetic and last A1c was  Lab Results  Component Value Date   HGBA1C 5.9 (H) 03/23/2022   .   PCP:  Donita Brooks, MD    . Denies any other pedal complaints. Denies n/v/f/c.   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    Angio-edema    Anxiety    Arthritis    Blood transfusion without reported diagnosis    1985 with hysterectomy    CAD (coronary artery disease)    stent of distal RCA in 2006   Diabetes mellitus without complication (HCC)    diet controlled-    Diverticulosis    GERD (gastroesophageal reflux disease)    Gout    H/O heart artery stent    Hiatal hernia    History of nuclear stress test 03/2011   negative lexiscan myoview; normal perfusion   Hyperlipidemia    Hypertension    Knee pain    Neuromuscular disorder (HCC)    Hiatal Hernia    Obesity    Positive TB test    Venous insufficiency     Objective:  Physical Exam: Vascular: DP/PT pulses 2/4 bilateral. CFT <3 seconds. Absent hair growth on digits. Edema noted to bilateral lower extremities. Xerosis noted bilaterally.  Skin. No lacerations or abrasions bilateral feet. Nails 1-5 bilateral  are thickened discolored and elongated with subungual debris.  Musculoskeletal: MMT 5/5 bilateral lower extremities in DF, PF, Inversion and Eversion. Deceased ROM in DF of ankle joint.  Neurological: Sensation intact to light touch. Protective sensation diminished bilateral.     Assessment:   1. Pain due to  onychomycosis of toenails of both feet   2. Type II diabetes mellitus with peripheral circulatory disorder (HCC)   3. PVD (peripheral vascular disease) (HCC)      Plan:  Patient was evaluated and treated and all questions answered. -Discussed and educated patient on diabetic foot care, especially with  regards to the vascular, neurological and musculoskeletal systems.  -Stressed the importance of good glycemic control and the detriment of not  controlling glucose levels in relation to the foot. -Discussed supportive shoes at all times and checking feet regularly.  -Mechanically debrided all nails 1-5 bilateral using sterile nail nipper and filed with dremel without incident  -Answered all patient questions -Patient to return  in 3 months for at risk foot care -Patient advised to call the office if any problems or questions arise in the meantime.   Louann Sjogren, [name]

## 2023-03-25 ENCOUNTER — Other Ambulatory Visit: Payer: Self-pay | Admitting: Internal Medicine

## 2023-03-25 ENCOUNTER — Other Ambulatory Visit: Payer: Self-pay | Admitting: Family Medicine

## 2023-03-25 DIAGNOSIS — K219 Gastro-esophageal reflux disease without esophagitis: Secondary | ICD-10-CM

## 2023-03-26 ENCOUNTER — Other Ambulatory Visit: Payer: Self-pay | Admitting: Family Medicine

## 2023-03-26 NOTE — Telephone Encounter (Addendum)
Prescription Request  03/26/2023  LOV: 03/31/2022  What is the name of the medication or equipment?   losartan (COZAAR) 100 MG tablet   amLODipine (NORVASC) 5 MG tablet [[id number]]   ALPRAZolam (XANAX) 1 MG tablet **New script requested**   Have you contacted your pharmacy to request a refill? Yes   Which pharmacy would you like this sent to?  [id number] [place], [place] - [address] [place] [address] Phone: [phone] Fax: [phone]    Patient notified that their request is being sent to the clinical staff for review and that they should receive a response within 2 business days.   Please advise pharmacist.

## 2023-03-27 MED ORDER — AMLODIPINE BESYLATE 5 MG PO TABS: 5.0000 mg | ORAL_TABLET | Freq: Every day | ORAL | 0 refills | Status: DC

## 2023-03-27 NOTE — Telephone Encounter (Signed)
Requested Prescriptions  Pending Prescriptions Disp Refills   amLODipine (NORVASC) 5 MG tablet 90 tablet 0    Sig: Take 1 tablet (5 mg total) by mouth daily.     Cardiovascular: Calcium Channel Blockers 2 Failed - 03/26/2023  2:25 PM      Failed - Valid encounter within last 6 months    Recent Outpatient Visits           1 year ago Benign essential HTN   [facility] Pickard, Priscille Heidelberg, MD   1 year ago Essential hypertension   [facility] Tanya Nones, Priscille Heidelberg, MD   1 year ago Exacerbation of gout   [facility] Tanya Nones, Priscille Heidelberg, MD   1 year ago Essential hypertension   [facility] Tanya Nones, Priscille Heidelberg, MD   2 years ago Essential hypertension   [facility] Pickard, Priscille Heidelberg, MD       Future Appointments             In 1 week Pickard, Priscille Heidelberg, MD [facility]   In 11 months Ellamae Sia, DO [facility] [place] at [facility] - Last BP in normal range    BP Readings from Last 1 Encounters:  03/19/23 122/60         Passed - Last Heart Rate in normal range    Pulse Readings from Last 1 Encounters:  03/19/23 77

## 2023-03-29 ENCOUNTER — Telehealth: Payer: Self-pay | Admitting: Family Medicine

## 2023-03-29 ENCOUNTER — Other Ambulatory Visit: Payer: Self-pay

## 2023-03-29 DIAGNOSIS — I10 Essential (primary) hypertension: Secondary | ICD-10-CM

## 2023-03-29 MED ORDER — LOSARTAN POTASSIUM 100 MG PO TABS: 100.0000 mg | ORAL_TABLET | Freq: Every day | ORAL | 0 refills | Status: DC

## 2023-03-29 NOTE — Telephone Encounter (Signed)
Prescription Request  03/29/2023  LOV: 03/31/2022  What is the name of the medication or equipment?   losartan (COZAAR) 100 MG tablet  **90 day script requested**  Have you contacted your pharmacy to request a refill? Yes   Which pharmacy would you like this sent to?  [id number] [place], [place] - [address] [place] [address] [place] [address] Phone: [phone] Fax: [phone]    Patient notified that their request is being sent to the clinical staff for review and that they should receive a response within 2 business days.   Please advise pharmacist.

## 2023-03-30 ENCOUNTER — Telehealth: Payer: Self-pay | Admitting: Family Medicine

## 2023-03-30 ENCOUNTER — Other Ambulatory Visit: Payer: Self-pay | Admitting: Family Medicine

## 2023-03-30 MED ORDER — ALPRAZOLAM 1 MG PO TABS: 1.0000 mg | ORAL_TABLET | Freq: Two times a day (BID) | ORAL | 0 refills | Status: DC

## 2023-03-30 NOTE — Telephone Encounter (Signed)
Patient called to check on status of refill request for ALPRAZolam Prudy Feeler) 1 MG tablet - stated pharmacy told her they couldn't fill it until Tuesday, 04/03/2023. Date of last refill was 02/12/2023 with no refills.   Please advise at [phone]

## 2023-04-02 ENCOUNTER — Ambulatory Visit (INDEPENDENT_AMBULATORY_CARE_PROVIDER_SITE_OTHER): Payer: 59

## 2023-04-02 DIAGNOSIS — J309 Allergic rhinitis, unspecified: Secondary | ICD-10-CM | POA: Diagnosis not present

## 2023-04-02 DIAGNOSIS — J302 Other seasonal allergic rhinitis: Secondary | ICD-10-CM

## 2023-04-06 ENCOUNTER — Ambulatory Visit: Payer: 59 | Admitting: Family Medicine

## 2023-04-09 ENCOUNTER — Ambulatory Visit (INDEPENDENT_AMBULATORY_CARE_PROVIDER_SITE_OTHER): Payer: 59

## 2023-04-09 DIAGNOSIS — J309 Allergic rhinitis, unspecified: Secondary | ICD-10-CM | POA: Diagnosis not present

## 2023-04-12 ENCOUNTER — Ambulatory Visit (INDEPENDENT_AMBULATORY_CARE_PROVIDER_SITE_OTHER): Payer: 59 | Admitting: Family Medicine

## 2023-04-12 ENCOUNTER — Encounter: Payer: Self-pay | Admitting: Family Medicine

## 2023-04-12 VITALS — BP 132/78 | HR 73 | Temp 98.4°F | Ht 66.0 in | Wt 177.0 lb

## 2023-04-12 DIAGNOSIS — I10 Essential (primary) hypertension: Secondary | ICD-10-CM | POA: Diagnosis not present

## 2023-04-12 DIAGNOSIS — E119 Type 2 diabetes mellitus without complications: Secondary | ICD-10-CM | POA: Diagnosis not present

## 2023-04-12 DIAGNOSIS — E782 Mixed hyperlipidemia: Secondary | ICD-10-CM | POA: Diagnosis not present

## 2023-04-12 DIAGNOSIS — E222 Syndrome of inappropriate secretion of antidiuretic hormone: Secondary | ICD-10-CM | POA: Diagnosis not present

## 2023-04-12 MED ORDER — ZOLPIDEM TARTRATE 10 MG PO TABS: ORAL_TABLET | ORAL | 0 refills | Status: DC

## 2023-04-12 NOTE — Progress Notes (Signed)
Subjective:    Patient ID: Madeline Wood, female    DOB: Aug 01, 1946, 77 y.o.   MRN: [id number]  I have not seen the patient in approximately 1 year.  She is here today for a checkup.  Overall she is doing very well.  Blood pressure is well-controlled at 132/78.  She denies any chest pain shortness of breath or dyspnea on exertion.  When I last saw the patient, she was experiencing hyponatremia.  She is overdue to recheck her sodium level.  She denies any headache.  She denies any dizziness.  She denies any lightheadedness.  She denies any nausea or vomiting or diarrhea.  Overall she is doing very well.  She does request a refill on Ambien.  She states that she takes this extremely rarely.  Maybe once every 5 days if she cannot sleep.  She also only uses half a pill.  We discussed the risk including sleepwalking, dizziness, and confusion and she would like to continue to get a refill on that medication. 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    Angio-edema    Anxiety    Arthritis    Blood transfusion without reported diagnosis    1985 with hysterectomy    CAD (coronary artery disease)    stent of distal RCA in 2006   Diabetes mellitus without complication (HCC)    diet controlled-    Diverticulosis    GERD (gastroesophageal reflux disease)    Gout    H/O heart artery stent    Hiatal hernia    History of nuclear stress test 03/2011   negative lexiscan myoview; normal perfusion   Hyperlipidemia    Hypertension    Knee pain    Neuromuscular disorder (HCC)    Hiatal Hernia    Obesity    Positive TB test    Venous insufficiency    Past Surgical History:  Procedure Laterality Date   ABDOMINAL HYSTERECTOMY     BACK SURGERY  2012   BREAST LUMPECTOMY     COLONOSCOPY  2011   CORONARY ANGIOPLASTY WITH STENT PLACEMENT  06/29/2005   Taxus 2.5x67mm DES to distal RCA (Dr. Jenne Campus)   TRANSTHORACIC  ECHOCARDIOGRAM  12/20/2012   EF 50-55%, normal systolic function, mild hypokinesis of inf myocardium; calcified MV annulua, LA & RA mildly dilated   Current Outpatient Medications on File Prior to Visit  Medication Sig Dispense Refill   albuterol (VENTOLIN HFA) 108 (90 Base) MCG/ACT inhaler TAKE 2 PUFFS INTO LUNGS EVERY 6 HOURS AS NEEDED FOR WHEEZE OR SHORTNESS OF BREATH 8.5 each 3   ALPRAZolam (XANAX) 1 MG tablet Take 1 tablet (1 mg total) by mouth 2 (two) times daily. 60 tablet 0   amLODipine (NORVASC) 5 MG tablet Take 1 tablet (5 mg total) by mouth daily. 90 tablet 0   aspirin 81 MG tablet Take 81 mg by mouth daily.     atorvastatin (LIPITOR) 80 MG tablet TAKE 1 TABLET BY MOUTH EVERY DAY 90 tablet 3   benzonatate (TESSALON) 200 MG capsule Take 1 capsule (200 mg total) by mouth 2 (two) times daily as needed for cough. 20 capsule 0   colchicine 0.6 MG tablet PLEASE SEE ATTACHED FOR DETAILED DIRECTIONS     cyclobenzaprine (FLEXERIL) 5 MG tablet TAKE 1 TABLET BY MOUTH THREE TIMES A DAY AS NEEDED FOR MUSCLE SPASMS 20 tablet 0   EPINEPHrine 0.3 mg/0.3  mL IJ SOAJ injection USE AS DIRECTED FOR LIFE THREATENING ALLERGIC REACTIONS 2 each 1   ezetimibe (ZETIA) 10 MG tablet Take 1 tablet (10 mg total) by mouth daily. 90 tablet 3   fexofenadine (ALLEGRA) 180 MG tablet Take 1 tablet (180 mg total) by mouth daily. 30 tablet 5   fish oil-omega-3 fatty acids 1000 MG capsule Take 1 g by mouth. Takes occasionally     fluticasone (FLONASE) 50 MCG/ACT nasal spray PLACE 1-2 SPRAYS INTO BOTH NOSTRILS DAILY AS NEEDED FOR ALLERGIES OR RHINITIS. 16 mL 5   losartan (COZAAR) 100 MG tablet Take 1 tablet (100 mg total) by mouth daily. 90 tablet 0   metoprolol tartrate (LOPRESSOR) 50 MG tablet TAKE 1 TABLET BY MOUTH EVERY MORNING AND 1 AND 1/2 TABLET EVERY EVENING 225 tablet 2   Multiple Vitamin (MULTIVITAMIN) tablet Take 1 tablet by mouth. occasionally     nitroGLYCERIN (NITROSTAT) 0.4 MG SL tablet PLACE 1 TABLET UNDER THE  TONGUE EVERY 5 MINUTES AS NEEDED FOR CHEST PAIN. 25 tablet 3   pantoprazole (PROTONIX) 40 MG tablet TAKE 1 TABLET BY MOUTH EVERY DAY 60 tablet 0   Wheat Dextrin (BENEFIBER PO) Take by mouth. Daily     zolpidem (AMBIEN) 10 MG tablet TAKE 1 TABLET BY MOUTH EVERY DAY AT BEDTIME AS NEEDED FOR SLEEP 30 tablet 3   [DISCONTINUED] omeprazole (PRILOSEC OTC) 20 MG tablet Take 1 tablet (20 mg total) by mouth daily. 30 tablet 1   No current facility-administered medications on file prior to visit.   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  Tobacco Use   Smoking status: Former    Current packs/day: 0.00    Types: Cigarettes    Quit date: 12/20/2001    Years since quitting: 21.3   Smokeless tobacco: Never  Vaping Use   Vaping status: Never Used  Substance and Sexual Activity   Alcohol use: No    Alcohol/week: 0.0 standard drinks of alcohol   Drug use: No   Sexual activity: Not on file  Other Topics Concern   Not on file  Social History Narrative   Not on file   Social Determinants of Health   Financial Resource Strain: Low Risk  (01/25/2023)   Overall Financial Resource Strain (CARDIA)    Difficulty of Paying Living Expenses: Not hard at all  Food Insecurity: No Food Insecurity (01/25/2023)   Hunger Vital Sign    Worried About Running Out of Food in the Last Year: Never true    Ran Out of Food in the Last Year: Never true  Transportation Needs: No Transportation Needs (01/25/2023)   PRAPARE - Administrator, Civil Service (Medical): No    Lack of Transportation (Non-Medical): No  Physical Activity: Insufficiently Active  (01/25/2023)   Exercise Vital Sign    Days of Exercise per Week: 3 days    Minutes of Exercise per Session: 30 min  Stress: No Stress Concern Present (01/25/2023)   [employer] of Occupational Health - Occupational Stress Questionnaire    Feeling of Stress : Not at  all  Social Connections: Moderately Integrated (01/25/2023)   Social Connection and Isolation Panel [NHANES]    Frequency of Communication with Friends and Family: More than three times a week    Frequency of Social Gatherings with Friends and Family: Three times a week    Attends Religious Services: More than 4 times per year    Active Member of Clubs or Organizations: Yes    Attends Banker Meetings: More than 4 times per year    Marital Status: Never married  Intimate Partner Violence: Not At Risk (01/25/2023)   Humiliation, Afraid, Rape, and Kick questionnaire    Fear of Current or Ex-Partner: No    Emotionally Abused: No    Physically Abused: No    Sexually Abused: No     Review of Systems  All other systems reviewed and are negative.      Objective:   Physical Exam Constitutional:      General: She is not in acute distress.    Appearance: Normal appearance. She is not ill-appearing, toxic-appearing or diaphoretic.  Cardiovascular:     Rate and Rhythm: Normal rate and regular rhythm.     Pulses: Normal pulses.     Heart sounds: Normal heart sounds. No murmur heard.    No friction rub. No gallop.  Pulmonary:     Effort: Pulmonary effort is normal. No respiratory distress.     Breath sounds: Normal breath sounds. No stridor. No wheezing, rhonchi or rales.  Abdominal:     General: Bowel sounds are normal. There is no distension.     Palpations: Abdomen is soft.     Tenderness: There is no abdominal tenderness. There is no rebound.  Musculoskeletal:     Right lower leg: No edema.     Left lower leg: No edema.  Neurological:     Mental Status: She is alert.    Wt Readings from Last 3  Encounters:  04/12/23 177 lb (80.3 kg)  03/19/23 178 lb 9.6 oz (81 kg)  01/25/23 175 lb (79.4 kg)          Assessment & Plan:  Essential hypertension - Plan: CBC with Differential/Platelet, COMPLETE METABOLIC PANEL WITH GFR, Lipid panel, Hemoglobin A1c  SIADH (syndrome of inappropriate ADH production) (HCC)  Diabetes mellitus without complication (HCC) - Plan: CBC with Differential/Platelet, COMPLETE METABOLIC PANEL WITH GFR, Lipid panel, Hemoglobin A1c  Mixed hyperlipidemia - Plan: CBC with Differential/Platelet, COMPLETE METABOLIC PANEL WITH GFR, Lipid panel, Hemoglobin A1c Patient appears to be in very good shape today.  Her blood pressure is well-controlled.  Check CBC CMP lipid panel and A1c.  I will refill the patient's Ambien but I cautioned her to use this sparingly.  Otherwise she denies any concerns.  The remainder of her review of systems is negative.

## 2023-04-13 LAB — COMPLETE METABOLIC PANEL WITH GFR
AG Ratio: 1.2 (calc) (ref 1.0–2.5)
ALT: 11 U/L (ref 6–29)
AST: 25 U/L (ref 10–35)
Albumin: 3.8 g/dL (ref 3.6–5.1)
Alkaline phosphatase (APISO): 36 U/L — ABNORMAL LOW (ref 37–153)
BUN: 9 mg/dL (ref 7–25)
CO2: 29 mmol/L (ref 20–32)
Calcium: 9.7 mg/dL (ref 8.6–10.4)
Chloride: 103 mmol/L (ref 98–110)
Creat: 0.63 mg/dL (ref 0.60–1.00)
Globulin: 3.1 g/dL (ref 1.9–3.7)
Glucose, Bld: 89 mg/dL (ref 65–99)
Potassium: 4 mmol/L (ref 3.5–5.3)
Sodium: 140 mmol/L (ref 135–146)
Total Bilirubin: 0.8 mg/dL (ref 0.2–1.2)
Total Protein: 6.9 g/dL (ref 6.1–8.1)
eGFR: 91 mL/min/{1.73_m2} (ref >=60)

## 2023-04-13 LAB — CBC WITH DIFFERENTIAL/PLATELET
Absolute Monocytes: 525 {cells}/uL (ref 200–950)
Basophils Absolute: 43 {cells}/uL (ref 0–200)
Basophils Relative: 0.7 %
Eosinophils Absolute: 92 {cells}/uL (ref 15–500)
Eosinophils Relative: 1.5 %
HCT: 37.7 % (ref 35.0–45.0)
Hemoglobin: 12.4 g/dL (ref 11.7–15.5)
Lymphs Abs: 2519 {cells}/uL (ref 850–3900)
MCH: 29.9 pg (ref 27.0–33.0)
MCHC: 32.9 g/dL (ref 32.0–36.0)
MCV: 90.8 fL (ref 80.0–100.0)
MPV: 9.2 fL (ref 7.5–12.5)
Monocytes Relative: 8.6 %
Neutro Abs: 2922 {cells}/uL (ref 1500–7800)
Neutrophils Relative %: 47.9 %
Platelets: 233 10*3/uL (ref 140–400)
RBC: 4.15 10*6/uL (ref 3.80–5.10)
RDW: 12.8 % (ref 11.0–15.0)
Total Lymphocyte: 41.3 %
WBC: 6.1 10*3/uL (ref 3.8–10.8)

## 2023-04-13 LAB — LIPID PANEL
Cholesterol: 142 mg/dL (ref <200)
HDL: 65 mg/dL (ref >=50)
LDL Cholesterol (Calc): 64 mg/dL
Non-HDL Cholesterol (Calc): 77 mg/dL (ref <130)
Total CHOL/HDL Ratio: 2.2 (calc) (ref <5.0)
Triglycerides: 47 mg/dL (ref <150)

## 2023-04-13 LAB — HEMOGLOBIN A1C
Hgb A1c MFr Bld: 5.9 %{Hb} — ABNORMAL HIGH (ref <5.7)
Mean Plasma Glucose: 123 mg/dL
eAG (mmol/L): 6.8 mmol/L

## 2023-04-18 ENCOUNTER — Ambulatory Visit: Payer: 59 | Admitting: Podiatry

## 2023-04-23 ENCOUNTER — Ambulatory Visit (INDEPENDENT_AMBULATORY_CARE_PROVIDER_SITE_OTHER): Payer: 59 | Admitting: *Deleted

## 2023-04-23 DIAGNOSIS — J309 Allergic rhinitis, unspecified: Secondary | ICD-10-CM | POA: Diagnosis not present

## 2023-05-01 ENCOUNTER — Other Ambulatory Visit: Payer: Self-pay | Admitting: Family Medicine

## 2023-05-02 ENCOUNTER — Ambulatory Visit (INDEPENDENT_AMBULATORY_CARE_PROVIDER_SITE_OTHER): Payer: 59 | Admitting: *Deleted

## 2023-05-02 DIAGNOSIS — J309 Allergic rhinitis, unspecified: Secondary | ICD-10-CM

## 2023-05-03 NOTE — Telephone Encounter (Signed)
Requested medications are due for refill today.  yes  Requested medications are on the active medications list.  yes  Last refill. 8/2/202 #60 0 rf   Future visit scheduled.   no  Notes to clinic.  Refill not delegated.    Requested Prescriptions  Pending Prescriptions Disp Refills   ALPRAZolam (XANAX) 1 MG tablet [Pharmacy Med Name: ALPRAZOLAM 1 MG TABLET] 60 tablet 0    Sig: TAKE 1 TABLET BY MOUTH TWICE A DAY     Not Delegated - Psychiatry: Anxiolytics/Hypnotics 2 Failed - 05/01/2023 10:53 AM      Failed - This refill cannot be delegated      Failed - Urine Drug Screen completed in last 360 days      Failed - Valid encounter within last 6 months    Recent Outpatient Visits           1 year ago Benign essential HTN   [facility] Pickard, Priscille Heidelberg, MD   1 year ago Essential hypertension   [facility] Tanya Nones, Priscille Heidelberg, MD   1 year ago Exacerbation of gout   [facility] Pickard, Priscille Heidelberg, MD   1 year ago Essential hypertension   [facility] Tanya Nones, Priscille Heidelberg, MD   2 years ago Essential hypertension   [facility] Pickard, Priscille Heidelberg, MD       Future Appointments             In 10 months Ellamae Sia, DO [place] [facility] [place] at [facility] - Patient is not pregnant

## 2023-05-07 LAB — HM MAMMOGRAPHY

## 2023-05-07 NOTE — Progress Notes (Signed)
VIALS EXP 05-06-24

## 2023-05-08 DIAGNOSIS — J3081 Allergic rhinitis due to animal (cat) (dog) hair and dander: Secondary | ICD-10-CM | POA: Diagnosis not present

## 2023-05-09 ENCOUNTER — Ambulatory Visit (INDEPENDENT_AMBULATORY_CARE_PROVIDER_SITE_OTHER): Payer: 59 | Admitting: *Deleted

## 2023-05-09 DIAGNOSIS — J309 Allergic rhinitis, unspecified: Secondary | ICD-10-CM | POA: Diagnosis not present

## 2023-05-10 DIAGNOSIS — J3089 Other allergic rhinitis: Secondary | ICD-10-CM | POA: Diagnosis not present

## 2023-05-16 ENCOUNTER — Ambulatory Visit (INDEPENDENT_AMBULATORY_CARE_PROVIDER_SITE_OTHER): Payer: 59 | Admitting: *Deleted

## 2023-05-16 DIAGNOSIS — J309 Allergic rhinitis, unspecified: Secondary | ICD-10-CM

## 2023-05-18 ENCOUNTER — Encounter: Payer: Self-pay | Admitting: Family Medicine

## 2023-05-22 ENCOUNTER — Ambulatory Visit: Payer: 59 | Admitting: Podiatry

## 2023-05-24 ENCOUNTER — Ambulatory Visit (INDEPENDENT_AMBULATORY_CARE_PROVIDER_SITE_OTHER): Payer: 59 | Admitting: *Deleted

## 2023-05-24 DIAGNOSIS — J309 Allergic rhinitis, unspecified: Secondary | ICD-10-CM | POA: Diagnosis not present

## 2023-05-31 ENCOUNTER — Ambulatory Visit (INDEPENDENT_AMBULATORY_CARE_PROVIDER_SITE_OTHER): Payer: 59

## 2023-05-31 DIAGNOSIS — J309 Allergic rhinitis, unspecified: Secondary | ICD-10-CM | POA: Diagnosis not present

## 2023-06-13 ENCOUNTER — Other Ambulatory Visit: Payer: Self-pay | Admitting: Family Medicine

## 2023-06-13 DIAGNOSIS — I10 Essential (primary) hypertension: Secondary | ICD-10-CM

## 2023-06-14 ENCOUNTER — Telehealth: Payer: Self-pay

## 2023-06-14 ENCOUNTER — Other Ambulatory Visit: Payer: Self-pay

## 2023-06-14 DIAGNOSIS — I10 Essential (primary) hypertension: Secondary | ICD-10-CM

## 2023-06-14 DIAGNOSIS — K219 Gastro-esophageal reflux disease without esophagitis: Secondary | ICD-10-CM

## 2023-06-14 MED ORDER — AMLODIPINE BESYLATE 5 MG PO TABS
5.0000 mg | ORAL_TABLET | Freq: Every day | ORAL | 1 refills | Status: DC
Start: 2023-06-14 — End: 2023-09-18

## 2023-06-14 MED ORDER — PANTOPRAZOLE SODIUM 40 MG PO TBEC: 40.0000 mg | DELAYED_RELEASE_TABLET | Freq: Every day | ORAL | 1 refills | Status: DC

## 2023-06-14 NOTE — Telephone Encounter (Signed)
Requested medication (s) are due for refill today: yes  Requested medication (s) are on the active medication list: yes  Last refill:  05/04/23 #60   Future visit scheduled:no  Notes to clinic:  med not delegated to NT to RF   Requested Prescriptions  Pending Prescriptions Disp Refills   ALPRAZolam (XANAX) 1 MG tablet [Pharmacy Med Name: ALPRAZOLAM 1 MG TABLET] 60 tablet     Sig: TAKE 1 TABLET BY MOUTH TWICE A DAY     Not Delegated - Psychiatry: Anxiolytics/Hypnotics 2 Failed - 06/13/2023  5:16 PM      Failed - This refill cannot be delegated      Failed - Urine Drug Screen completed in last 360 days      Failed - Valid encounter within last 6 months    Recent Outpatient Visits           1 year ago Benign essential HTN   [facility] Pickard, Priscille Heidelberg, MD   1 year ago Essential hypertension   [facility] Tanya Nones, Priscille Heidelberg, MD   1 year ago Exacerbation of gout   [facility] Tanya Nones, Priscille Heidelberg, MD   2 years ago Essential hypertension   [facility] Tanya Nones, Priscille Heidelberg, MD   2 years ago Essential hypertension   [facility] Pickard, Priscille Heidelberg, MD       Future Appointments             In 9 months Selena Batten Jamesetta So, DO [place] [facility] [place] at [facility] - Patient is not pregnant      Signed Prescriptions Disp Refills   losartan (COZAAR) 100 MG tablet 90 tablet 1    Sig: TAKE 1 TABLET BY MOUTH EVERY DAY     Cardiovascular:  Angiotensin Receptor Blockers Failed - 06/13/2023  5:16 PM      Failed - Valid encounter within last 6 months    Recent Outpatient Visits           1 year ago Benign essential HTN   [facility] Pickard, Priscille Heidelberg, MD   1 year ago Essential hypertension   [facility] Pickard, Priscille Heidelberg, MD   1 year ago Exacerbation of gout   [facility] Tanya Nones, Priscille Heidelberg, MD   2 years ago Essential  hypertension   [facility] Tanya Nones, Priscille Heidelberg, MD   2 years ago Essential hypertension   [facility] Donita Brooks, MD       Future Appointments             In 9 months Ellamae Sia, DO [place] [facility] [place] at [facility] - Cr in normal range and within 180 days    Creat  Date Value Ref Range Status  04/12/2023 0.63 0.60 - 1.00 mg/dL Final   Creatinine, Urine  Date Value Ref Range Status  12/20/2020 109 20 - 275 mg/dL Final         Passed - K in normal range and within 180 days    Potassium  Date Value Ref Range Status  04/12/2023 4.0 3.5 - 5.3 mmol/L Final         Passed - Patient is not pregnant  Passed - Last BP in normal range    BP Readings from Last 1 Encounters:  04/12/23 132/78

## 2023-06-14 NOTE — Telephone Encounter (Signed)
Prescription Request  06/14/2023  LOV: 04/06/23  What is the name of the medication or equipment? amLODipine (NORVASC) 5 MG tablet [[id number]]  Have you contacted your pharmacy to request a refill? Yes   Which pharmacy would you like this sent to?  [id number] [place], [place] - [address] AT [address] [place] [address] Phone: [phone] Fax: [phone]    Patient notified that their request is being sent to the clinical staff for review and that they should receive a response within 2 business days.   Please advise at [facility] [phone]   Prescription Request  06/14/2023  LOV: 04/06/23  What is the name of the medication or equipment? ALPRAZolam (XANAX) 1 MG tablet [[id number]]  Have you contacted your pharmacy to request a refill? Yes   Which pharmacy would you like this sent to?  [id number] [place], [place] - [address] [place] [address] Phone: [phone] Fax: [phone]    Patient notified that their request is being sent to the clinical staff for review and that they should receive a response within 2 business days.   Please advise at [facility] [phone]

## 2023-06-14 NOTE — Telephone Encounter (Signed)
Requested Prescriptions  Pending Prescriptions Disp Refills   losartan (COZAAR) 100 MG tablet [Pharmacy Med Name: LOSARTAN POTASSIUM 100 MG TAB] 90 tablet 1    Sig: TAKE 1 TABLET BY MOUTH EVERY DAY     Cardiovascular:  Angiotensin Receptor Blockers Failed - 06/13/2023  5:16 PM      Failed - Valid encounter within last 6 months    Recent Outpatient Visits           1 year ago Benign essential HTN   [facility] Pickard, Priscille Heidelberg, MD   1 year ago Essential hypertension   [facility] Tanya Nones, Priscille Heidelberg, MD   1 year ago Exacerbation of gout   [facility] Tanya Nones, Priscille Heidelberg, MD   2 years ago Essential hypertension   [facility] Tanya Nones, Priscille Heidelberg, MD   2 years ago Essential hypertension   [facility] Donita Brooks, MD       Future Appointments             In 9 months Ellamae Sia, DO [place] [facility] [place] at [facility] - Cr in normal range and within 180 days    Creat  Date Value Ref Range Status  04/12/2023 0.63 0.60 - 1.00 mg/dL Final   Creatinine, Urine  Date Value Ref Range Status  12/20/2020 109 20 - 275 mg/dL Final         Passed - K in normal range and within 180 days    Potassium  Date Value Ref Range Status  04/12/2023 4.0 3.5 - 5.3 mmol/L Final         Passed - Patient is not pregnant      Passed - Last BP in normal range    BP Readings from Last 1 Encounters:  04/12/23 132/78          ALPRAZolam (XANAX) 1 MG tablet [Pharmacy Med Name: ALPRAZOLAM 1 MG TABLET] 60 tablet     Sig: TAKE 1 TABLET BY MOUTH TWICE A DAY     Not Delegated - Psychiatry: Anxiolytics/Hypnotics 2 Failed - 06/13/2023  5:16 PM      Failed - This refill cannot be delegated      Failed - Urine Drug Screen completed in last 360 days      Failed - Valid encounter within last 6 months    Recent Outpatient Visits           1 year ago Benign essential HTN    [facility] Donita Brooks, MD   1 year ago Essential hypertension   [facility] Tanya Nones, Priscille Heidelberg, MD   1 year ago Exacerbation of gout   [facility] Tanya Nones, Priscille Heidelberg, MD   2 years ago Essential hypertension   [facility] Tanya Nones, Priscille Heidelberg, MD   2 years ago Essential hypertension   [facility] Pickard, Priscille Heidelberg, MD       Future Appointments             In 9 months Selena Batten Jamesetta So, DO [place] [facility] [place] at [facility] - Patient is not pregnant

## 2023-06-14 NOTE — Telephone Encounter (Signed)
Prescription Request  06/14/2023  LOV: 04/06/23  What is the name of the medication or equipment? pantoprazole (PROTONIX) 40 MG tablet [[id number]]  Have you contacted your pharmacy to request a refill? Yes   Which pharmacy would you like this sent to?  [id number] [place], [place] - [address] [place] [address] Phone: [phone] Fax: [phone]    Patient notified that their request is being sent to the clinical staff for review and that they should receive a response within 2 business days.   Please advise at [facility] [phone]

## 2023-06-20 ENCOUNTER — Encounter: Payer: Self-pay | Admitting: Podiatry

## 2023-06-20 ENCOUNTER — Ambulatory Visit: Payer: 59 | Admitting: Podiatry

## 2023-06-20 DIAGNOSIS — M79675 Pain in left toe(s): Secondary | ICD-10-CM | POA: Diagnosis not present

## 2023-06-20 DIAGNOSIS — E1151 Type 2 diabetes mellitus with diabetic peripheral angiopathy without gangrene: Secondary | ICD-10-CM | POA: Diagnosis not present

## 2023-06-20 DIAGNOSIS — M2041 Other hammer toe(s) (acquired), right foot: Secondary | ICD-10-CM

## 2023-06-20 DIAGNOSIS — E119 Type 2 diabetes mellitus without complications: Secondary | ICD-10-CM

## 2023-06-20 DIAGNOSIS — L84 Corns and callosities: Secondary | ICD-10-CM | POA: Diagnosis not present

## 2023-06-20 DIAGNOSIS — M2141 Flat foot [pes planus] (acquired), right foot: Secondary | ICD-10-CM

## 2023-06-20 DIAGNOSIS — M79674 Pain in right toe(s): Secondary | ICD-10-CM

## 2023-06-20 DIAGNOSIS — M2011 Hallux valgus (acquired), right foot: Secondary | ICD-10-CM

## 2023-06-20 DIAGNOSIS — B351 Tinea unguium: Secondary | ICD-10-CM | POA: Diagnosis not present

## 2023-06-20 DIAGNOSIS — M2012 Hallux valgus (acquired), left foot: Secondary | ICD-10-CM

## 2023-06-20 DIAGNOSIS — M2042 Other hammer toe(s) (acquired), left foot: Secondary | ICD-10-CM

## 2023-06-20 NOTE — Progress Notes (Signed)
ANNUAL DIABETIC FOOT EXAM  Subjective: Madeline Wood presents today annual diabetic foot exam.  Chief Complaint  Patient presents with   [facility]    [facility] A1C-5.9 04/13/2023 BS-not checked PCP-pt doesn't have scheduled appt   Patient confirms h/o diabetes.  Patient denies any h/o foot wounds.  Donita Brooks, MD is patient's PCP.  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    Angio-edema    Anxiety    Arthritis    Blood transfusion without reported diagnosis    1985 with hysterectomy    CAD (coronary artery disease)    stent of distal RCA in 2006   Diabetes mellitus without complication (HCC)    diet controlled-    Diverticulosis    GERD (gastroesophageal reflux disease)    Gout    H/O heart artery stent    Hiatal hernia    History of nuclear stress test 03/2011   negative lexiscan myoview; normal perfusion   Hyperlipidemia    Hypertension    Knee pain    Neuromuscular disorder (HCC)    Hiatal Hernia    Obesity    Positive TB test    Venous insufficiency    Patient Active Problem List   Diagnosis Date Noted   Viral URI with cough 08/30/2022   Prediabetes 10/19/2021   Allergic reaction 09/29/2019   Allergic rhinitis 09/13/2018   Bilateral high frequency sensorineural hearing loss 01/17/2018   Subjective tinnitus of left ear 01/17/2018   Atherosclerosis of native coronary artery of native heart without angina pectoris 07/23/2017   History of food allergy 01/05/2016   Angioedema 11/17/2015   Seasonal allergic conjunctivitis 05/04/2015   Insomnia 08/24/2014   Bilateral leg edema 04/17/2014   Diabetes mellitus without complication (HCC)    Essential hypertension    Gout    Mixed hyperlipidemia    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 Jenne Campus)   TRANSTHORACIC ECHOCARDIOGRAM  12/20/2012   EF 50-55%, normal systolic function, mild hypokinesis of inf myocardium; calcified MV annulua, LA & RA mildly dilated   Current Outpatient Medications on File Prior to Visit  Medication Sig Dispense Refill   albuterol (VENTOLIN HFA) 108 (90 Base) MCG/ACT inhaler TAKE 2 PUFFS INTO LUNGS EVERY 6 HOURS AS NEEDED FOR WHEEZE OR SHORTNESS OF BREATH 8.5 each 3   amLODipine (NORVASC) 5 MG tablet Take 1 tablet (5 mg total) by mouth daily. 90 tablet 1   aspirin 81 MG tablet Take 81 mg by mouth daily.     atorvastatin (LIPITOR) 80 MG tablet TAKE 1 TABLET BY MOUTH EVERY DAY 90 tablet 3   colchicine 0.6 MG tablet PLEASE SEE ATTACHED FOR DETAILED DIRECTIONS     cyclobenzaprine (FLEXERIL) 5 MG tablet TAKE 1 TABLET BY MOUTH THREE TIMES A DAY AS NEEDED FOR MUSCLE SPASMS 20 tablet 0   EPINEPHrine 0.3 mg/0.3  mL IJ SOAJ injection USE AS DIRECTED FOR LIFE THREATENING ALLERGIC REACTIONS 2 each 1   ezetimibe (ZETIA) 10 MG tablet Take 1 tablet (10 mg total) by mouth daily. 90 tablet 3   fexofenadine (ALLEGRA) 180 MG tablet Take 1 tablet (180 mg total) by mouth daily. 30 tablet 5   fish oil-omega-3 fatty acids 1000 MG capsule Take 1 g by mouth. Takes occasionally     fluticasone (FLONASE) 50 MCG/ACT nasal spray PLACE 1-2 SPRAYS INTO BOTH NOSTRILS DAILY AS NEEDED FOR ALLERGIES OR RHINITIS. 16 mL 5   losartan (COZAAR) 100 MG tablet TAKE 1 TABLET BY MOUTH EVERY DAY 90 tablet 1   metoprolol tartrate (LOPRESSOR) 50 MG tablet TAKE 1 TABLET BY MOUTH EVERY MORNING AND 1 AND 1/2 TABLET EVERY EVENING 225 tablet 2   Multiple Vitamin (MULTIVITAMIN) tablet Take 1 tablet by  mouth. occasionally     nitroGLYCERIN (NITROSTAT) 0.4 MG SL tablet PLACE 1 TABLET UNDER THE TONGUE EVERY 5 MINUTES AS NEEDED FOR CHEST PAIN. 25 tablet 3   pantoprazole (PROTONIX) 40 MG tablet Take 1 tablet (40 mg total) by mouth daily. 90 tablet 1   Wheat Dextrin (BENEFIBER PO) Take by mouth. Daily     zolpidem (AMBIEN) 10 MG tablet TAKE 1 TABLET BY MOUTH EVERY DAY AT BEDTIME AS NEEDED FOR SLEEP 30 tablet 0   [DISCONTINUED] omeprazole (PRILOSEC OTC) 20 MG tablet Take 1 tablet (20 mg total) by mouth daily. 30 tablet 1   No current facility-administered medications on file prior to visit.    Allergies  Allergen Reactions   Ciprofloxacin    Citalopram Hydrobromide    Escitalopram Oxalate    Paroxetine    Polysporin [Bacitracin-Polymyxin B]    Social History   Occupational History   Occupation: Field seismologist  Tobacco Use   Smoking status: Former    Current packs/day: 0.00    Types: Cigarettes    Quit date: 12/20/2001    Years since quitting: 21.5   Smokeless tobacco: Never  Vaping Use   Vaping status: Never Used  Substance and Sexual Activity   Alcohol use: No    Alcohol/week: 0.0 standard drinks of alcohol   Drug use: No   Sexual activity: Not on file   Family History  Problem Relation Age of Onset   Ovarian cancer Mother    Other Father        homicide   Diabetes Sister    Hypertension Sister    Pancreatic cancer Brother    Heart attack Brother        x2   Hypertension Brother    Esophageal cancer Brother    Prostate cancer Brother    Colon cancer Neg Hx    Colon polyps Neg Hx    Rectal cancer Neg Hx    Stomach cancer Neg Hx    Immunization History  Administered Date(s) Administered   Fluad Quad(high Dose 65+) 05/20/2019, 07/20/2021, 07/31/2022   H1N1 08/03/2008   Influenza Whole 05/28/2008, 07/07/2011   Influenza, High Dose Seasonal PF 06/04/2017, 05/29/2018, 07/14/2020   Influenza,inj,Quad PF,6+ Mos 05/28/2013, 07/02/2014, 06/01/2015, 05/24/2016    Influenza-Unspecified 06/09/2008, 06/03/2009, 05/19/2010   Pfizer Covid-19 Vaccine Bivalent Booster 46yrs & up 09/08/2021   Pneumococcal Conjugate-13 08/18/2013   Pneumococcal Polysaccharide-23 02/25/2014   Td 05/13/2007, 01/25/2012   Tdap 01/25/2012   Unspecified SARS-COV-2 Vaccination 10/20/2019, 10/31/2019, 06/28/2020   Zoster, Live 07/02/2014     Review of Systems: Negative except as noted in the HPI.   Objective: There  were no vitals filed for this visit.  Madeline Wood is a pleasant 77 y.o. female in NAD. AAO X 3.  Title   Diabetic Foot Exam - detailed Date & Time: 06/20/2023  1:12 PM Diabetic Foot exam was performed with the following findings: Yes  Visual Foot Exam completed.: Yes  Is there a history of foot ulcer?: No Is there a foot ulcer now?: No Is there swelling?: No Is there elevated skin temperature?: No Is there abnormal foot shape?: Yes Is there a claw toe deformity?: No Are the toenails long?: Yes Are the toenails thick?: Yes Are the toenails ingrown?: No Is the skin thin, fragile, shiny and hairless?": Yes Normal Range of Motion?: Yes Is there foot or ankle muscle weakness?: No Do you have pain in calf while walking?: No Are the shoes appropriate in style and fit?: Yes Can the patient see the bottom of their feet?: No Pulse Foot Exam completed.: Yes   Right Posterior Tibialis: Absent Left posterior Tibialis: Absent   Right Dorsalis Pedis: Present Left Dorsalis Pedis: Present     Sensory Foot Exam Completed.: Yes Semmes-Weinstein Monofilament Test "+" means "has sensation" and "-" means "no sensation"  R Foot Test Control: Pos L Foot Test Control: Pos   R Site 1-Great Toe: Pos L Site 1-Great Toe: Pos   R Site 4: Pos L Site 4: Pos   R site 5: Pos L Site 5: Pos  R Site 6: Pos L Site 6: Pos     Image components are not supported.   Image components are not supported. Image components are not supported.  Tuning [place]ork Right vibratory: present  Left vibratory: present  Comments HAV with bunion deformity b/l. Hammertoes 2-5 b/l.  Hyperkeratotic lesion(s) bilateral great toes and submet head 2 b/l.  No erythema, no edema, no drainage, no fluctuance.        Lab Results  Component Value Date   HGBA1C 5.9 (H) 04/12/2023   ADA Risk Categorization: High Risk  Patient has one or more of the following: Loss of protective sensation Absent pedal pulses Severe Foot deformity History of foot ulcer  Assessment: 1. Pain due to onychomycosis of toenails of both feet   2. Callus   3. Hallux valgus, acquired, bilateral   4. Acquired hammertoes of both feet   5. Type II diabetes mellitus with peripheral circulatory disorder (HCC)   6. Encounter for diabetic foot exam [facility])     Plan: -Patient was evaluated and treated. All patient's and/or POA's questions/concerns answered on today's visit. -Diabetic foot examination performed today. -Continue diabetic foot care principles: inspect feet daily, monitor glucose as recommended by PCP and/or Endocrinologist, and follow prescribed diet per PCP, Endocrinologist and/or dietician. -Patient to continue soft, supportive shoe gear daily. -Toenails 1-5 b/l were debrided in length and girth with sterile nail nippers and dremel without iatrogenic bleeding.  -Patient/POA to call should there be question/concern in the interim. Return in about 3 months (around 09/20/2023).  Freddie Breech, [name]

## 2023-06-20 NOTE — Telephone Encounter (Signed)
Pt called in to check on status of this refill ALPRAZolam (XANAX) 1 MG tablet [[id number]]. Pt stated she received other refill, but not received this med.   LOV: 04/12/23  PHARMACY: [id number] [place], [place] - [address] [place] [address] Phone: [phone]  Fax: [phone] DEA #: [id number]  CB#: [phone]

## 2023-06-21 ENCOUNTER — Other Ambulatory Visit: Payer: Self-pay | Admitting: Family Medicine

## 2023-06-21 MED ORDER — ALPRAZOLAM 1 MG PO TABS: 1.0000 mg | ORAL_TABLET | Freq: Two times a day (BID) | ORAL | 0 refills | Status: DC

## 2023-06-26 ENCOUNTER — Other Ambulatory Visit: Payer: Self-pay | Admitting: Physician Assistant

## 2023-07-02 ENCOUNTER — Ambulatory Visit (INDEPENDENT_AMBULATORY_CARE_PROVIDER_SITE_OTHER): Payer: 59 | Admitting: *Deleted

## 2023-07-02 DIAGNOSIS — J309 Allergic rhinitis, unspecified: Secondary | ICD-10-CM | POA: Diagnosis not present

## 2023-07-24 ENCOUNTER — Ambulatory Visit (INDEPENDENT_AMBULATORY_CARE_PROVIDER_SITE_OTHER): Payer: 59

## 2023-07-24 DIAGNOSIS — J309 Allergic rhinitis, unspecified: Secondary | ICD-10-CM

## 2023-07-25 ENCOUNTER — Ambulatory Visit: Payer: 59 | Admitting: Podiatry

## 2023-07-30 ENCOUNTER — Other Ambulatory Visit: Payer: Self-pay | Admitting: Family Medicine

## 2023-07-30 NOTE — Telephone Encounter (Signed)
Requested medication (s) are due for refill today - yes  Requested medication (s) are on the active medication list -yes  Future visit scheduled -no  Last refill: 06/21/23 #60  Notes to clinic: non delegated Rx  Requested Prescriptions  Pending Prescriptions Disp Refills   ALPRAZolam (XANAX) 1 MG tablet [Pharmacy Med Name: ALPRAZOLAM 1 MG TABLET] 60 tablet 0    Sig: TAKE 1 TABLET BY MOUTH TWICE A DAY     Not Delegated - Psychiatry: Anxiolytics/Hypnotics 2 Failed - 07/30/2023 10:59 AM      Failed - This refill cannot be delegated      Failed - Urine Drug Screen completed in last 360 days      Failed - Valid encounter within last 6 months    Recent Outpatient Visits           1 year ago Benign essential HTN   [facility] Pickard, Priscille Heidelberg, MD   1 year ago Essential hypertension   [facility] Tanya Nones, Priscille Heidelberg, MD   2 years ago Exacerbation of gout   [facility] Tanya Nones, Priscille Heidelberg, MD   2 years ago Essential hypertension   [facility] Tanya Nones, Priscille Heidelberg, MD   2 years ago Essential hypertension   [facility] Pickard, Priscille Heidelberg, MD       Future Appointments             In 7 months Selena Batten Jamesetta So, DO [facility] - Patient is not pregnant         Requested Prescriptions  Pending Prescriptions Disp Refills   ALPRAZolam (XANAX) 1 MG tablet [Pharmacy Med Name: ALPRAZOLAM 1 MG TABLET] 60 tablet 0    Sig: TAKE 1 TABLET BY MOUTH TWICE A DAY     Not Delegated - Psychiatry: Anxiolytics/Hypnotics 2 Failed - 07/30/2023 10:59 AM      Failed - This refill cannot be delegated      Failed - Urine Drug Screen completed in last 360 days      Failed - Valid encounter within last 6 months    Recent Outpatient Visits           1 year ago Benign essential HTN   [facility] Pickard, Priscille Heidelberg, MD   1 year ago Essential  hypertension   [facility] Tanya Nones, Priscille Heidelberg, MD   2 years ago Exacerbation of gout   [facility] Tanya Nones, Priscille Heidelberg, MD   2 years ago Essential hypertension   [facility] Tanya Nones, Priscille Heidelberg, MD   2 years ago Essential hypertension   [facility] Pickard, Priscille Heidelberg, MD       Future Appointments             In 7 months Selena Batten Jamesetta So, DO [facility] at [facility] - Patient is not pregnant

## 2023-08-01 ENCOUNTER — Ambulatory Visit (INDEPENDENT_AMBULATORY_CARE_PROVIDER_SITE_OTHER): Payer: 59

## 2023-08-01 DIAGNOSIS — J309 Allergic rhinitis, unspecified: Secondary | ICD-10-CM | POA: Diagnosis not present

## 2023-08-02 ENCOUNTER — Other Ambulatory Visit: Payer: Self-pay | Admitting: Physician Assistant

## 2023-08-03 ENCOUNTER — Other Ambulatory Visit: Payer: Self-pay | Admitting: Family Medicine

## 2023-08-03 NOTE — Telephone Encounter (Unsigned)
Copied from CRM [id number]. Topic: Clinical - Medication Refill >> Aug 03, 2023  4:15 PM Fuller Mandril wrote: Most Recent Primary Care Visit:  Provider: Lynnea Ferrier T  [facility]  Visit Type: OFFICE VISIT  Date: 04/12/2023  Medication: ALPRAZolam Prudy Feeler) 1 MG tablet  Has the patient contacted their pharmacy? Yes - pharmacy has requested - no response  (Agent: If no, request that the patient contact the pharmacy for the refill. If patient does not wish to contact the pharmacy document the reason why and proceed with request.) (Agent: If yes, when and what did the pharmacy advise?)  Is this the correct pharmacy for this prescription? Yes If no, delete pharmacy and type the correct one.  This is the patient's preferred pharmacy:  [id number] [place], [place] - [address] [place] [address] Phone: [phone] Fax: [phone]   Has the prescription been filled recently? No  Is the patient out of the medication? Yes  Has the patient been seen for an appointment in the last year OR does the patient have an upcoming appointment? Yes  Can we respond through MyChart? No  Agent: Please be advised that Rx refills may take up to 3 business days. We ask that you follow-up with your pharmacy.

## 2023-08-06 ENCOUNTER — Other Ambulatory Visit: Payer: Self-pay | Admitting: Family Medicine

## 2023-08-06 ENCOUNTER — Telehealth: Payer: Self-pay

## 2023-08-06 MED ORDER — ALPRAZOLAM 1 MG PO TABS: 1.0000 mg | ORAL_TABLET | Freq: Two times a day (BID) | ORAL | 0 refills | Status: DC

## 2023-08-06 NOTE — Telephone Encounter (Signed)
Pt has called and needs a refill on her Xanax. Last RF was 06/21/2023. Last OV 04/12/2023. Thanks.

## 2023-08-16 ENCOUNTER — Ambulatory Visit (INDEPENDENT_AMBULATORY_CARE_PROVIDER_SITE_OTHER): Payer: 59

## 2023-08-16 DIAGNOSIS — J309 Allergic rhinitis, unspecified: Secondary | ICD-10-CM | POA: Diagnosis not present

## 2023-08-20 ENCOUNTER — Ambulatory Visit (INDEPENDENT_AMBULATORY_CARE_PROVIDER_SITE_OTHER): Payer: 59

## 2023-08-20 DIAGNOSIS — J309 Allergic rhinitis, unspecified: Secondary | ICD-10-CM

## 2023-09-06 ENCOUNTER — Other Ambulatory Visit: Payer: Self-pay | Admitting: Family Medicine

## 2023-09-06 NOTE — Telephone Encounter (Unsigned)
 Copied from CRM [id number]. Topic: Clinical - Medication Refill >> Sep 06, 2023  1:24 PM Delon DASEN wrote: Most Recent Primary Care Visit:  Provider: DUANNE LOWERS T  Department: [facility]  Visit Type: OFFICE VISIT  Date: 04/12/2023  Medication: ***  Has the patient contacted their pharmacy? {yes/no:20286} (Agent: If no, request that the patient contact the pharmacy for the refill. If patient does not wish to contact the pharmacy document the reason why and proceed with request.) (Agent: If yes, when and what did the pharmacy advise?)  Is this the correct pharmacy for this prescription? {yes/no:20286} If no, delete pharmacy and type the correct one.  This is the patient's preferred pharmacy:  [id number] [place], [place] - [address] [place] [address] Phone: [phone] Fax: [phone]   Has the prescription been filled recently? {yes/no:20286}  Is the patient out of the medication? {yes/no:20286}  Has the patient been seen for an appointment in the last year OR does the patient have an upcoming appointment? {yes/no:20286}  Can we respond through MyChart? {yes/no:20286}  Agent: Please be advised that Rx refills may take up to 3 business days. We ask that you follow-up with your pharmacy.

## 2023-09-10 ENCOUNTER — Other Ambulatory Visit: Payer: Self-pay | Admitting: Family Medicine

## 2023-09-10 NOTE — Telephone Encounter (Signed)
 Copied from CRM [id number]. Topic: Clinical - Medication Refill >> Sep 10, 2023  3:30 PM Farrel B wrote: Most Recent Primary Care Visit:  Provider: DUANNE LOWERS T  [facility]  Visit Type: OFFICE VISIT  Date: 04/12/2023  Medication:  ALPRAZolam  (XANAX ) 1 MG tablet    Has the patient contacted their pharmacy? Yes (Agent: If no, request that the patient contact the pharmacy for the refill. If patient does not wish to contact the pharmacy document the reason why and proceed with request.) (Agent: If yes, when and what did the pharmacy advise?) Yes, they requested she contact the provider   Is this the correct pharmacy for this prescription? Yes If no, delete pharmacy and type the correct one.  This is the patient's preferred pharmacy:  [id number] [place], [place] - [address] [place] [address] Phone: [phone] Fax: [phone]   Has the prescription been filled recently? Yes  Is the patient out of the medication? Yes  Has the patient been seen for an appointment in the last year OR does the patient have an upcoming appointment? Yes  Can we respond through MyChart? No  Agent: Please be advised that Rx refills may take up to 3 business days. We ask that you follow-up with your pharmacy.

## 2023-09-10 NOTE — Telephone Encounter (Signed)
 Requested medication (s) are due for refill today: yes  Requested medication (s) are on the active medication list: yes  Last refill:  08/06/23  Future visit scheduled: no  Notes to clinic:  Unable to refill per protocol, cannot delegate.      Requested Prescriptions  Pending Prescriptions Disp Refills   ALPRAZolam  (XANAX ) 1 MG tablet [Pharmacy Med Name: ALPRAZOLAM  1 MG TABLET] 60 tablet 0    Sig: TAKE 1 TABLET BY MOUTH TWICE A DAY     Not Delegated - Psychiatry: Anxiolytics/Hypnotics 2 Failed - 09/10/2023 11:42 AM      Failed - This refill cannot be delegated      Failed - Urine Drug Screen completed in last 360 days      Failed - Valid encounter within last 6 months    Recent Outpatient Visits           1 year ago Benign essential HTN   [facility] Pickard, Butler DASEN, MD   1 year ago Essential hypertension   [facility] [name], MD   2 years ago Exacerbation of gout   [facility] Duanne, Butler DASEN, MD   2 years ago Essential hypertension   [facility] Duanne, Butler DASEN, MD   2 years ago Essential hypertension   [facility] Pickard, Butler DASEN, MD       Future Appointments             In 6 months Luke Orlan HERO, DO [facility] - Patient is not pregnant

## 2023-09-11 MED ORDER — ALPRAZOLAM 1 MG PO TABS: 1.0000 mg | ORAL_TABLET | Freq: Two times a day (BID) | ORAL | 0 refills | Status: DC

## 2023-09-18 ENCOUNTER — Other Ambulatory Visit: Payer: Self-pay | Admitting: Physician Assistant

## 2023-09-18 ENCOUNTER — Other Ambulatory Visit: Payer: Self-pay | Admitting: Family Medicine

## 2023-09-18 DIAGNOSIS — I10 Essential (primary) hypertension: Secondary | ICD-10-CM

## 2023-09-18 MED ORDER — AMLODIPINE BESYLATE 5 MG PO TABS: 5.0000 mg | ORAL_TABLET | Freq: Every day | ORAL | 1 refills | Status: DC

## 2023-09-18 NOTE — Telephone Encounter (Signed)
Copied from CRM [id number]. Topic: Clinical - Medication Refill >> Sep 18, 2023  1:37 PM Dennison Nancy wrote: Most Recent Primary Care Visit:  Provider: Lynnea Ferrier T  [facility]: [facility]  Visit Type: OFFICE VISIT  Date: 04/12/2023  Medication: amLODipine (NORVASC) 5 MG tablet  Has the patient contacted their pharmacy? Yes (Agent: If no, request that the patient contact the pharmacy for the refill. If patient does not wish to contact the pharmacy document the reason why and proceed with request.) (Agent: If yes, when and what did the pharmacy advise?)  Is this the correct pharmacy for this prescription? Yes  , pharmactist saying they refilled on 08/29/2023 , patient did not received any refill on 08/29/23  If no, delete pharmacy and type the correct one.  This is the patient's preferred pharmacy:  [id number] [place], [place] - [address] [place] [address] Phone: [phone] Fax: [phone]   Has the prescription been filled recently? No  Is the patient out of the medication? No   , have 5 pills left   Has the patient been seen for an appointment in the last year OR does the patient have an upcoming appointment?   Can we respond through MyChart?   Agent: Please be advised that Rx refills may take up to 3 business days. We ask that you follow-up with your pharmacy.

## 2023-09-24 ENCOUNTER — Ambulatory Visit (INDEPENDENT_AMBULATORY_CARE_PROVIDER_SITE_OTHER): Payer: 59 | Admitting: *Deleted

## 2023-09-24 DIAGNOSIS — J309 Allergic rhinitis, unspecified: Secondary | ICD-10-CM | POA: Diagnosis not present

## 2023-10-02 ENCOUNTER — Ambulatory Visit (INDEPENDENT_AMBULATORY_CARE_PROVIDER_SITE_OTHER): Payer: 59 | Admitting: Podiatry

## 2023-10-02 ENCOUNTER — Encounter: Payer: Self-pay | Admitting: Podiatry

## 2023-10-02 VITALS — Ht 66.0 in

## 2023-10-02 DIAGNOSIS — M79675 Pain in left toe(s): Secondary | ICD-10-CM | POA: Diagnosis not present

## 2023-10-02 DIAGNOSIS — E1151 Type 2 diabetes mellitus with diabetic peripheral angiopathy without gangrene: Secondary | ICD-10-CM

## 2023-10-02 DIAGNOSIS — M79674 Pain in right toe(s): Secondary | ICD-10-CM

## 2023-10-02 DIAGNOSIS — B351 Tinea unguium: Secondary | ICD-10-CM | POA: Diagnosis not present

## 2023-10-08 NOTE — Progress Notes (Signed)
  Subjective:  Patient ID: Madeline Wood, female    DOB: 1946-03-04,  MRN: [id number]  Madeline Wood presents to clinic today for at risk foot care. Pt has h/o NIDDM with PAD and painful, elongated thickened toenails x 10 which are symptomatic when wearing enclosed shoe gear. This interferes with his/her daily activities.  Chief Complaint  Patient presents with   RFC    She is here for a nail, she is pre-diabetic, Her PCP is Dr. Duanne and last seen 6 months ago,    New problem(s): None.   PCP is [name], MD.  Allergies  Allergen Reactions   Citalopram Hydrobromide    Escitalopram Oxalate    Paroxetine    Polysporin [Bacitracin-Polymyxin B]    Ciprofloxacin     Review of Systems: Negative except as noted in the HPI.  Objective: No changes noted in today's physical examination. There were no vitals filed for this visit. Madeline Wood is a pleasant 78 y.o. female in NAD. AAO x 3.  Vascular Examination: CFT <3 seconds b/l. DP pulses palpable b/l. PT pulses nonpalpable b/l. Digital hair absent. Skin temperature gradient warm to warm b/l. No pain with calf compression. No ischemia or gangrene. No cyanosis or clubbing noted b/l.    Neurological Examination: Sensation grossly intact b/l with 10 gram monofilament. Vibratory sensation intact b/l.   Dermatological Examination: Pedal skin warm and supple b/l. No open wounds b/l. No interdigital macerations. Toenails 1-5 b/l thick, discolored, elongated with subungual debris and pain on dorsal palpation.  No corns, calluses nor porokeratotic lesions noted.  Musculoskeletal Examination: Muscle strength 5/5 to all lower extremity muscle groups bilaterally. HAV with bunion bilaterally and hammertoes 2-5 b/l.  Radiographs: None  Last HgA1c:      Latest Ref Rng & Units 04/12/2023   11:17 AM  Hemoglobin A1C  Hemoglobin-A1c <5.7 % of total Hgb 5.9     Assessment/Plan: 1. Pain due to onychomycosis of toenails of both feet    2. Type II diabetes mellitus with peripheral circulatory disorder [facility])    Patient was evaluated and treated. All patient's and/or POA's questions/concerns addressed on today's visit. Mycotic toenails 1-5 debrided in length and girth without incident. Continue soft, supportive shoe gear daily. Report any pedal injuries to medical professional. Call office if there are any quesitons/concerns. -Continue foot and shoe inspections daily. Monitor blood glucose per PCP/Endocrinologist's recommendations. -Patient/POA to call should there be question/concern in the interim.   Return in about 3 months (around 12/30/2023).  Delon LITTIE Merlin, [name]      [place] LOCATION: [address] [place] [address]                   Office [phone]   [facility] LOCATION: [address] [place], [place] [address] Office [phone]

## 2023-10-15 ENCOUNTER — Other Ambulatory Visit: Payer: Self-pay | Admitting: Family Medicine

## 2023-10-16 NOTE — Telephone Encounter (Signed)
Requested medication (s) are due for refill today: Yes  Requested medication (s) are on the active medication list: Yes  Last refill:  09/11/23 #60 0 refills  Future visit scheduled: No  Notes to clinic:  Unable to refill per protocol, cannot delegate.      Requested Prescriptions  Pending Prescriptions Disp Refills   ALPRAZolam (XANAX) 1 MG tablet [Pharmacy Med Name: ALPRAZOLAM 1 MG TABLET] 60 tablet 0    Sig: TAKE 1 TABLET BY MOUTH TWICE A DAY     Not Delegated - Psychiatry: Anxiolytics/Hypnotics 2 Failed - 10/16/2023  8:55 AM      Failed - This refill cannot be delegated      Failed - Urine Drug Screen completed in last 360 days      Failed - Valid encounter within last 6 months    Recent Outpatient Visits           1 year ago Benign essential HTN   [facility] Pickard, Priscille Heidelberg, MD   1 year ago Essential hypertension   [facility] Donita Brooks, MD   2 years ago Exacerbation of gout   [facility] Tanya Nones, Priscille Heidelberg, MD   2 years ago Essential hypertension   [facility] Tanya Nones, Priscille Heidelberg, MD   2 years ago Essential hypertension   [facility] Pickard, Priscille Heidelberg, MD       Future Appointments             In 5 months Selena Batten Jamesetta So, DO [facility] at [facility] - Patient is not pregnant

## 2023-10-19 ENCOUNTER — Other Ambulatory Visit: Payer: Self-pay | Admitting: Family Medicine

## 2023-10-19 MED ORDER — ALPRAZOLAM 1 MG PO TABS: 1.0000 mg | ORAL_TABLET | Freq: Two times a day (BID) | ORAL | 0 refills | Status: DC

## 2023-10-29 ENCOUNTER — Ambulatory Visit (INDEPENDENT_AMBULATORY_CARE_PROVIDER_SITE_OTHER): Admitting: *Deleted

## 2023-10-29 DIAGNOSIS — J309 Allergic rhinitis, unspecified: Secondary | ICD-10-CM

## 2023-11-05 ENCOUNTER — Telehealth: Payer: Self-pay | Admitting: Family Medicine

## 2023-11-05 ENCOUNTER — Other Ambulatory Visit: Payer: Self-pay

## 2023-11-05 DIAGNOSIS — K219 Gastro-esophageal reflux disease without esophagitis: Secondary | ICD-10-CM

## 2023-11-05 DIAGNOSIS — I10 Essential (primary) hypertension: Secondary | ICD-10-CM

## 2023-11-05 MED ORDER — LOSARTAN POTASSIUM 100 MG PO TABS: 100.0000 mg | ORAL_TABLET | Freq: Every day | ORAL | 1 refills | Status: DC

## 2023-11-05 MED ORDER — PANTOPRAZOLE SODIUM 40 MG PO TBEC: 40.0000 mg | DELAYED_RELEASE_TABLET | Freq: Every day | ORAL | 1 refills | Status: DC

## 2023-11-05 NOTE — Telephone Encounter (Signed)
 Prescription Request  11/05/2023  LOV: 04/12/2023  What is the name of the medication or equipment?   losartan (COZAAR) 100 MG tablet   pantoprazole (PROTONIX) 40 MG tablet [[id number]]   Have you contacted your pharmacy to request a refill? Yes   Which pharmacy would you like this sent to?  [id number] [place], [place] - [address] [place] [address] Phone: [phone] Fax: [phone]    Patient notified that their request is being sent to the clinical staff for review and that they should receive a response within 2 business days.   Please advise pharmacist

## 2023-11-12 ENCOUNTER — Ambulatory Visit: Payer: 59 | Attending: Internal Medicine | Admitting: Internal Medicine

## 2023-11-12 ENCOUNTER — Encounter: Payer: Self-pay | Admitting: Internal Medicine

## 2023-11-12 VITALS — BP 130/90 | HR 67 | Ht 66.0 in | Wt 172.8 lb

## 2023-11-12 DIAGNOSIS — I10 Essential (primary) hypertension: Secondary | ICD-10-CM

## 2023-11-12 DIAGNOSIS — I2510 Atherosclerotic heart disease of native coronary artery without angina pectoris: Secondary | ICD-10-CM | POA: Diagnosis not present

## 2023-11-12 DIAGNOSIS — E785 Hyperlipidemia, unspecified: Secondary | ICD-10-CM

## 2023-11-12 NOTE — Patient Instructions (Signed)
 Medication Instructions:  NO CHANGES  *If you need a refill on your cardiac medications before your next appointment, please call your pharmacy*  Follow-Up: At [facility], you and your health needs are our priority.  As part of our continuing mission to provide you with exceptional heart care, we have created designated Provider Care Teams.  These Care Teams include your primary Cardiologist (physician) and Advanced Practice Providers (APPs -  Physician Assistants and Nurse Practitioners) who all work together to provide you with the care you need, when you need it.  We recommend signing up for the patient portal called "MyChart".  Sign up information is provided on this After Visit Summary.  MyChart is used to connect with patients for Virtual Visits (Telemedicine).  Patients are able to view lab/test results, encounter notes, upcoming appointments, etc.  Non-urgent messages can be sent to your provider as well.   To learn more about what you can do with MyChart, go to [email or web address].    Your next appointment:    ONE YEAR with PA or NP  Other Instructions   1st Floor: - Lobby - Registration  - Pharmacy  - Lab - Cafe  2nd Floor: - PV Lab - Diagnostic Testing (echo, CT, nuclear med)  3rd Floor: - Vacant  4th Floor: - TCTS (cardiothoracic surgery) - AFib Clinic - Structural Heart Clinic - Vascular Surgery  - Vascular Ultrasound  5th Floor: - HeartCare Cardiology (general and EP) - Clinical Pharmacy for coumadin, hypertension, lipid, weight-loss medications, and med management appointments    Valet parking services will be available as well.

## 2023-11-12 NOTE — Progress Notes (Signed)
 OFFICE NOTE  Chief Complaint:  Routine follow-up  Primary Care Physician: Donita Brooks, MD  HPI:  Madeline Wood is a 78 year old female patient formerly followed by Dr. Alanda Amass, who works as a Oncologist. She has a history of coronary disease and had a drug-eluting stent placed to the distal RCA in 2006. She had a negative Myoview in 2012. Just has a history of obesity, hypertension and dyslipidemia. In 2012 underwent back surgery by Dr. Yevette Edwards and has had a good recovery from that.  Recently she had some problems with low blood pressure and had a decrease in her losartan dose to 50 mg once daily which has helped. She has however been having some lower extremity swelling.  When she last saw Dr. Alanda Amass in April 2014, he recommended starting her Dyazide to one full tablet daily. She did not start that given her concern about the change in her blood pressure. It is noted however her blood pressure is slightly higher today 150/84. She is asymptomatic, denying any chest pain, shortness of breath, palpitations, presyncope or syncopal symptoms.  Madeline Wood returns today for followup.  She denies any chest pain or worsening shortness of breath. Recent laboratory work shows good control of her cholesterol.  She is currently on atorvastatin 80 mg daily and recently had that he had discontinued due to excellent cholesterol control. She is also on aspirin and Plavix as well as losartan and Lopressor.  06/01/2016  Madeline Wood returns today for follow-up. She's done very well over he past year. r worsening shortness of breath. Weight is stable and I've encouraged her to continue to work on lowering it. Cholesterol control is been excellent. She has been on long-standing aspirin and Plavix however her stent was in 2006. Is not clear that there is an ongoing indication for dual antiplatelet therapy.Blood pressure is well controlled. EKG shows normal sinus rhythm at 62.  07/23/2017  Madeline Wood returns today for follow-up.  She denies any new symptoms such as chest pain or worsening shortness of breath.  She had repeat lipid testing and May which showed an LDL C of 75, down slightly from her most recent testing.  Her hemoglobin A1c also is improved at 5.9.  This is mostly with dietary changes, what weight has been fairly stable.  She is not on any diabetic medications.  Reports a decrease in exercise recently and is committed to restarting that.  07/03/2018  Madeline Wood is seen today for routine follow-up.  Overall she continues to be without complaints.  Blood pressure initially was elevated 156/90 have her recheck came down to 144/88.  She was late for the appointment today and rush to get to the office.  She denies any new chest pain or worsening shortness of breath.  The lipid panel in July 2019 which showed total cholesterol 169, HDL 66, triglycerides 47 and LDL 89.  This represents good control however is not a goal LDL less than 70.  07/11/2019  Madeline Wood returns today for follow-up.  Overall she is well.  Her weight is stable.  Recent labs showed HDL 73 LDL 87.  She had extensive work-up as part of an insurance physical with multiple labs that are all pretty good.  Her blood pressure today was 124/76.  EKG shows sinus rhythm.  07/13/2020  Madeline Wood is seen today in follow-up.  This is a routine visit.  She continues to do well and is now 15 years out from PCI.  She denies any angina or worsening shortness of breath.  She has had some recent weight loss.  Blood pressures well controlled today.  EKG shows normal sinus rhythm.  She had lab work in June which showed total cholesterol 150, HDL 62, triglycerides 53 and LDL 75.  07/20/2021  Madeline Wood returns today for follow-up.  Overall she says she is feeling well.  Blood pressure is well controlled today.  She is lost additional weight.  She is actually down about 15 pounds since I last saw her.  EKG shows a normal sinus  rhythm.  She denies any chest pain or worsening shortness of breath.  Recently lab work is shown a hyponatremia.  Sodium has been running around 130.  This has been fairly consistent for a while.  I noted she is on a combination thiazide diuretic.  11/12/2023  Madeline Wood is seen today for routine follow-up.  Overall she continues to feel well.  She denies any chest pain or worsening shortness of breath.  She has done well with her stent being back in 2006.  Blood pressure was a little elevated today however home blood pressure readings are well-controlled.  LDL in August 2024 was 64.  She did complain of some right periauricular pain today.  No fever or hearing changes.  No tooth pain or swallowing issues.  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    Angio-edema    Anxiety    Arthritis    Blood transfusion without reported diagnosis    1985 with hysterectomy    CAD (coronary artery disease)    stent of distal RCA in 2006   Diabetes mellitus without complication (HCC)    diet controlled-    Diverticulosis    GERD (gastroesophageal reflux disease)    Gout    H/O heart artery stent    Hiatal hernia    History of nuclear stress test 03/2011   negative lexiscan myoview; normal perfusion   Hyperlipidemia    Hypertension    Knee pain    Neuromuscular disorder (HCC)    Hiatal Hernia    Obesity    Positive TB test    Venous insufficiency     Past Surgical History:  Procedure Laterality Date   ABDOMINAL HYSTERECTOMY     BACK SURGERY  2012   BREAST LUMPECTOMY     COLONOSCOPY  2011   CORONARY ANGIOPLASTY WITH STENT PLACEMENT  06/29/2005   Taxus 2.5x32mm DES to distal RCA (Dr. Jenne Campus)   TRANSTHORACIC ECHOCARDIOGRAM  12/20/2012   EF 50-55%, normal systolic function, mild hypokinesis of inf myocardium; calcified MV annulua, LA & RA mildly dilated    FAMHx:  Family History  Problem  Relation Age of Onset   Ovarian cancer Mother    Other Father        homicide   Diabetes Sister    Hypertension Sister    Pancreatic cancer Brother    Heart attack Brother        x2   Hypertension Brother    Esophageal cancer Brother    Prostate cancer Brother    Colon cancer Neg Hx    Colon polyps Neg Hx    Rectal cancer Neg Hx    Stomach cancer Neg Hx     SOCHx:   reports that she quit smoking about 21 years ago. Her smoking use included cigarettes. She has never  used smokeless tobacco. She reports that she does not drink alcohol and does not use drugs.  ALLERGIES:  Allergies  Allergen Reactions   Citalopram Hydrobromide    Escitalopram Oxalate    Paroxetine    Polysporin [Bacitracin-Polymyxin B]    Ciprofloxacin     ROS: Pertinent items noted in HPI and remainder of comprehensive ROS otherwise negative.  HOME MEDS: Current Outpatient Medications  Medication Sig Dispense Refill   albuterol (VENTOLIN HFA) 108 (90 Base) MCG/ACT inhaler TAKE 2 PUFFS INTO LUNGS EVERY 6 HOURS AS NEEDED FOR WHEEZE OR SHORTNESS OF BREATH 8.5 each 3   ALPRAZolam (XANAX) 1 MG tablet TAKE 1 TABLET BY MOUTH TWICE A DAY 60 tablet 0   ALPRAZolam (XANAX) 1 MG tablet Take 1 tablet (1 mg total) by mouth 2 (two) times daily. 60 tablet 0   amLODipine (NORVASC) 5 MG tablet Take 1 tablet (5 mg total) by mouth daily. 90 tablet 1   aspirin 81 MG tablet Take 81 mg by mouth daily.     atorvastatin (LIPITOR) 80 MG tablet TAKE 1 TABLET BY MOUTH EVERY DAY 90 tablet 3   colchicine 0.6 MG tablet PLEASE SEE ATTACHED FOR DETAILED DIRECTIONS     cyclobenzaprine (FLEXERIL) 5 MG tablet TAKE 1 TABLET BY MOUTH THREE TIMES A DAY AS NEEDED FOR MUSCLE SPASMS 20 tablet 0   EPINEPHrine 0.3 mg/0.3 mL IJ SOAJ injection USE AS DIRECTED FOR LIFE THREATENING ALLERGIC REACTIONS 2 each 1   ezetimibe (ZETIA) 10 MG tablet TAKE 1 TABLET (10 MG TOTAL) BY MOUTH DAILY. PLEASE CALL [phone] TO SCHEDULE AN APPOINTMENT FOR FUTURE  REFILLS. THANK YOU. 60 tablet 0   fexofenadine (ALLEGRA) 180 MG tablet Take 1 tablet (180 mg total) by mouth daily. 30 tablet 5   fish oil-omega-3 fatty acids 1000 MG capsule Take 1 g by mouth. Takes occasionally     fluticasone (FLONASE) 50 MCG/ACT nasal spray PLACE 1-2 SPRAYS INTO BOTH NOSTRILS DAILY AS NEEDED FOR ALLERGIES OR RHINITIS. 48 mL 1   losartan (COZAAR) 100 MG tablet Take 1 tablet (100 mg total) by mouth daily. 90 tablet 1   metoprolol tartrate (LOPRESSOR) 50 MG tablet TAKE 1 TABLET BY MOUTH EVERY MORNING AND 1 AND 1/2 TABLET EVERY EVENING 225 tablet 2   Multiple Vitamin (MULTIVITAMIN) tablet Take 1 tablet by mouth. occasionally     nitroGLYCERIN (NITROSTAT) 0.4 MG SL tablet PLACE 1 TABLET UNDER THE TONGUE EVERY 5 MINUTES AS NEEDED FOR CHEST PAIN. 25 tablet 3   pantoprazole (PROTONIX) 40 MG tablet Take 1 tablet (40 mg total) by mouth daily. 90 tablet 1   Wheat Dextrin (BENEFIBER PO) Take by mouth. Daily     zolpidem (AMBIEN) 10 MG tablet TAKE 1 TABLET BY MOUTH EVERY DAY AT BEDTIME AS NEEDED FOR SLEEP 30 tablet 0   No current facility-administered medications for this visit.    LABS/IMAGING: No results found for this or any previous visit (from the past 48 hours). No results found.  VITALS: There were no vitals taken for this visit.  EXAM: General appearance: alert and no distress Neck: no adenopathy, no JVD, supple, symmetrical, trachea midline, thyroid not enlarged, symmetric, no tenderness/mass/nodules, and right TM clear, no erythema or exudate or fluid Lungs: clear to auscultation bilaterally Heart: regular rate and rhythm, S1, S2 normal, no murmur, click, rub or gallop Abdomen: soft, non-tender; bowel sounds normal; no masses,  no organomegaly Extremities: extremities normal, atraumatic, no cyanosis or edema Pulses: 2+ and symmetric Skin: Skin color, texture,  turgor normal. No rashes or lesions Neurologic: Grossly normal Psych: Pleasant mood, affect  EKG: EKG  Interpretation Date/Time:  Monday November 12 2023 09:01:28 EDT Ventricular Rate:  67 PR Interval:  178 QRS Duration:  86 QT Interval:  358 QTC Calculation: 378 R Axis:   59  Text Interpretation: Normal sinus rhythm Minimal voltage criteria for LVH, may be normal variant ( Sokolow-Lyon ) When compared with ECG of 14-Apr-2020 20:26, No significant change since last tracing Confirmed by Zoila Shutter [phone]) on 11/12/2023 9:14:46 AM    ASSESSMENT: Coronary disease status post PCI to the distal RCA in 2006 (DES) Obesity Hypertension Dyslipidemia Chronic hyponatremia  PLAN: 1.   Madeline Wood is doing well without any chest pain or worsening shortness of breath.  Blood pressure was top normal today however better controlled at home.  Her cholesterol is at target with LDL less than 70.  She is done well now almost 20 years out from stenting in the RCA.  She did complain of some right periauricular pain today however her right eardrum looks normal.  There was no adenopathy on exam.  This could be related arthritis or perhaps sinusitis as she does have seasonal allergies.  Could also be dental related.  If her symptoms do not improve I would encourage her to follow-up with her PCP.  Follow-up annually or sooner as necessary.  Chrystie Nose, MD, [facility] Loll  [place]  [facility]  Medical Director of the Advanced Lipid Disorders &  Cardiovascular Risk Reduction Clinic Attending Cardiologist  Direct Dial: [phone]  Fax: [phone]  Website:  [email or web address]  Lisette Abu Felice Deem 11/12/2023, 9:01 AM

## 2023-11-13 LAB — LAB REPORT - SCANNED: Albumin/Creatinine Ratio, Urine, POC: 59

## 2023-11-16 ENCOUNTER — Other Ambulatory Visit: Payer: Self-pay | Admitting: Family Medicine

## 2023-11-19 ENCOUNTER — Telehealth: Payer: Self-pay

## 2023-11-19 NOTE — Telephone Encounter (Signed)
 Requested medications are due for refill today.  yes  Requested medications are on the active medications list.  yes  Last refill. 10/16/2023 #60 0 rf  Future visit scheduled.   yes  Notes to clinic.  Refill not delegated.    Requested Prescriptions  Pending Prescriptions Disp Refills   ALPRAZolam (XANAX) 1 MG tablet [Pharmacy Med Name: ALPRAZOLAM 1 MG TABLET] 60 tablet 0    Sig: TAKE 1 TABLET BY MOUTH TWICE A DAY     Not Delegated - Psychiatry: Anxiolytics/Hypnotics 2 Failed - 11/19/2023 11:12 AM      Failed - This refill cannot be delegated      Failed - Urine Drug Screen completed in last 360 days      Failed - Valid encounter within last 6 months    Recent Outpatient Visits           1 year ago Benign essential HTN   [facility] Pickard, Priscille Heidelberg, MD   1 year ago Essential hypertension   [facility] Donita Brooks, MD   2 years ago Exacerbation of gout   [facility] Tanya Nones, Priscille Heidelberg, MD   2 years ago Essential hypertension   [facility] Tanya Nones, Priscille Heidelberg, MD   2 years ago Essential hypertension   [facility] Pickard, Priscille Heidelberg, MD       Future Appointments             In 3 months Selena Batten Jamesetta So, DO [facility] [place] at [facility] - Patient is not pregnant

## 2023-11-19 NOTE — Telephone Encounter (Signed)
 Copied from CRM [id number]. Topic: Clinical - Lab/Test Results >> Nov 19, 2023  3:57 PM Clayton Bibles wrote: Reason for CRM: Milley will like a nurse to call her about her urine test that was done in home [employer]. She will read the results to the nurse. Dr. Tanya Nones should receive them through the mail. She wants to if results are showing anything.  Please call her at [phone]

## 2023-11-30 ENCOUNTER — Ambulatory Visit (INDEPENDENT_AMBULATORY_CARE_PROVIDER_SITE_OTHER): Admitting: *Deleted

## 2023-11-30 DIAGNOSIS — J309 Allergic rhinitis, unspecified: Secondary | ICD-10-CM

## 2023-12-10 NOTE — Telephone Encounter (Unsigned)
 Copied from CRM [id number]. Topic: Clinical - Lab/Test Results >> Dec 10, 2023  3:59 PM Madeline Wood A wrote: Reason for CRM: Patient had a urine sample done and wants to speak about checking her kidneys. She wants to see if she needs an appointment to speak about this.

## 2023-12-13 ENCOUNTER — Ambulatory Visit: Admitting: Family Medicine

## 2023-12-13 ENCOUNTER — Encounter: Payer: Self-pay | Admitting: Family Medicine

## 2023-12-13 VITALS — BP 130/80 | HR 63 | Temp 97.6°F | Ht 66.0 in | Wt 172.6 lb

## 2023-12-13 DIAGNOSIS — I10 Essential (primary) hypertension: Secondary | ICD-10-CM

## 2023-12-13 DIAGNOSIS — E7800 Pure hypercholesterolemia, unspecified: Secondary | ICD-10-CM

## 2023-12-13 DIAGNOSIS — R7303 Prediabetes: Secondary | ICD-10-CM | POA: Diagnosis not present

## 2023-12-13 DIAGNOSIS — R350 Frequency of micturition: Secondary | ICD-10-CM | POA: Diagnosis not present

## 2023-12-13 LAB — URINALYSIS, ROUTINE W REFLEX MICROSCOPIC
Bilirubin Urine: NEGATIVE
Glucose, UA: NEGATIVE
Hgb urine dipstick: NEGATIVE
Ketones, ur: NEGATIVE
Leukocytes,Ua: NEGATIVE
Nitrite: NEGATIVE
Protein, ur: NEGATIVE
Specific Gravity, Urine: 1.01 (ref 1.001–1.035)
pH: 7 (ref 5.0–8.0)

## 2023-12-13 MED ORDER — ZOLPIDEM TARTRATE 10 MG PO TABS: ORAL_TABLET | ORAL | 0 refills | Status: DC

## 2023-12-13 NOTE — Progress Notes (Signed)
 Subjective:    Patient ID: Madeline Wood, female    DOB: 18-Sep-1945, 78 y.o.   MRN: [id number]  Patient was recently visited by home health program.  Her creatinine was checked.  Her GFR was found to be 90.  She did have an elevated albumin to creatinine ratio at 59.  She is here today to discuss these results.  Her blood pressure is excellent at 130/80.  She denies any chest pain or shortness of breath or dyspnea on exertion.  She has a history of borderline/prediabetes.  She is due to monitor her blood sugar. 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    Angio-edema    Anxiety    Arthritis    Blood transfusion without reported diagnosis    1985 with hysterectomy    CAD (coronary artery disease)    stent of distal RCA in 2006   Diabetes mellitus without complication (HCC)    diet controlled-    Diverticulosis    GERD (gastroesophageal reflux disease)    Gout    H/O heart artery stent    Hiatal hernia    History of nuclear stress test 03/2011   negative lexiscan myoview; normal perfusion   Hyperlipidemia    Hypertension    Knee pain    Neuromuscular disorder (HCC)    Hiatal Hernia    Obesity    Positive TB test    Venous insufficiency    Past Surgical History:  Procedure Laterality Date   ABDOMINAL HYSTERECTOMY     BACK SURGERY  2012   BREAST LUMPECTOMY     COLONOSCOPY  2011   CORONARY ANGIOPLASTY WITH STENT PLACEMENT  06/29/2005   [place]us 2.5x76mm DES to distal RCA (Dr. Jenne Campus)   TRANSTHORACIC ECHOCARDIOGRAM  12/20/2012   EF 50-55%, normal systolic function, mild hypokinesis of inf myocardium; calcified MV annulua, LA & RA mildly dilated   Current Outpatient Medications on File Prior to Visit  Medication Sig Dispense Refill   albuterol (VENTOLIN HFA) 108 (90 Base) MCG/ACT inhaler TAKE 2 PUFFS INTO LUNGS EVERY 6 HOURS AS NEEDED FOR WHEEZE OR SHORTNESS OF BREATH 8.5 each 3    ALPRAZolam (XANAX) 1 MG tablet Take 1 tablet (1 mg total) by mouth 2 (two) times daily. 60 tablet 0   ALPRAZolam (XANAX) 1 MG tablet TAKE 1 TABLET BY MOUTH TWICE A DAY 60 tablet 0   amLODipine (NORVASC) 5 MG tablet Take 1 tablet (5 mg total) by mouth daily. 90 tablet 1   aspirin 81 MG tablet Take 81 mg by mouth daily.     atorvastatin (LIPITOR) 80 MG tablet TAKE 1 TABLET BY MOUTH EVERY DAY 90 tablet 3   colchicine 0.6 MG tablet PLEASE SEE ATTACHED FOR DETAILED DIRECTIONS     cyclobenzaprine (FLEXERIL) 5 MG tablet TAKE 1 TABLET BY MOUTH THREE TIMES A DAY AS NEEDED FOR MUSCLE SPASMS 20 tablet 0   EPINEPHrine 0.3 mg/0.3 mL IJ SOAJ injection USE AS DIRECTED FOR LIFE THREATENING ALLERGIC REACTIONS 2 each 1   ezetimibe (ZETIA) 10 MG tablet TAKE 1 TABLET (10 MG TOTAL) BY MOUTH DAILY. PLEASE CALL [phone] TO SCHEDULE AN APPOINTMENT FOR FUTURE REFILLS. THANK YOU. 60 tablet 0   fexofenadine (ALLEGRA) 180 MG tablet Take 1 tablet (180 mg total) by mouth daily. 30 tablet 5   fish oil-omega-3 fatty acids 1000 MG capsule Take 1 g by mouth. Takes occasionally  fluticasone (FLONASE) 50 MCG/ACT nasal spray PLACE 1-2 SPRAYS INTO BOTH NOSTRILS DAILY AS NEEDED FOR ALLERGIES OR RHINITIS. 48 mL 1   losartan (COZAAR) 100 MG tablet Take 1 tablet (100 mg total) by mouth daily. 90 tablet 1   metoprolol tartrate (LOPRESSOR) 50 MG tablet TAKE 1 TABLET BY MOUTH EVERY MORNING AND 1 AND 1/2 TABLET EVERY EVENING 225 tablet 2   Multiple Vitamin (MULTIVITAMIN) tablet Take 1 tablet by mouth. occasionally     nitroGLYCERIN (NITROSTAT) 0.4 MG SL tablet PLACE 1 TABLET UNDER THE TONGUE EVERY 5 MINUTES AS NEEDED FOR CHEST PAIN. 25 tablet 3   pantoprazole (PROTONIX) 40 MG tablet Take 1 tablet (40 mg total) by mouth daily. 90 tablet 1   Wheat Dextrin (BENEFIBER PO) Take by mouth. Daily     zolpidem (AMBIEN) 10 MG tablet TAKE 1 TABLET BY MOUTH EVERY DAY AT BEDTIME AS NEEDED FOR SLEEP 30 tablet 0   [DISCONTINUED] omeprazole (PRILOSEC  OTC) 20 MG tablet Take 1 tablet (20 mg total) by mouth daily. 30 tablet 1   No current facility-administered medications on file prior to visit.   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  Tobacco Use   Smoking status: Former    Current packs/day: 0.00    Types: Cigarettes    Quit date: 12/20/2001    Years since quitting: 21.9   Smokeless tobacco: Never  Vaping Use   Vaping status: Never Used  Substance and Sexual Activity   Alcohol use: No    Alcohol/week: 0.0 standard drinks of alcohol   Drug use: No   Sexual activity: Not on file  Other Topics Concern   Not on file  Social History Narrative   Not on file   Social Drivers of Health   Financial Resource Strain: Low Risk  (01/25/2023)   Overall Financial Resource Strain (CARDIA)    Difficulty of Paying Living Expenses: Not hard at all  Food Insecurity: No Food Insecurity (01/25/2023)   Hunger Vital Sign    Worried About Running Out of Food in the Last Year: Never true    Ran Out of Food in the Last Year: Never true  Transportation Needs: No Transportation Needs (01/25/2023)   PRAPARE - Administrator, Civil Service (Medical): No    Lack of Transportation (Non-Medical): No  Physical Activity: Insufficiently Active (01/25/2023)   Exercise Vital Sign    Days of Exercise per Week: 3 days    Minutes of Exercise per Session: 30 min  Stress: No Stress Concern Present (01/25/2023)   [employer] of Occupational Health - Occupational Stress Questionnaire    Feeling of Stress : Not at all  Social Connections: Moderately Integrated (01/25/2023)   Social Connection  and Isolation Panel [NHANES]    Frequency of Communication with Friends and Family: More than three times a week    Frequency of Social Gatherings with Friends and Family: Three times a week    Attends Religious Services: More than 4 times per year    Active Member of Clubs or Organizations: Yes  Attends Banker Meetings: More than 4 times per year    Marital Status: Never married  Intimate Partner Violence: Not At Risk (01/25/2023)   Humiliation, Afraid, Rape, and Kick questionnaire    Fear of Current or Ex-Partner: No    Emotionally Abused: No    Physically Abused: No    Sexually Abused: No     Review of Systems  All other systems reviewed and are negative.      Objective:   Physical Exam Constitutional:      General: She is not in acute distress.    Appearance: Normal appearance. She is not ill-appearing, toxic-appearing or diaphoretic.  Cardiovascular:     Rate and Rhythm: Normal rate and regular rhythm.     Pulses: Normal pulses.     Heart sounds: Normal heart sounds. No murmur heard.    No friction rub. No gallop.  Pulmonary:     Effort: Pulmonary effort is normal. No respiratory distress.     Breath sounds: Normal breath sounds. No stridor. No wheezing, rhonchi or rales.  Abdominal:     General: Bowel sounds are normal. There is no distension.     Palpations: Abdomen is soft.     Tenderness: There is no abdominal tenderness. There is no rebound.  Musculoskeletal:     Right lower leg: No edema.     Left lower leg: No edema.  Neurological:     Mental Status: She is alert.         Assessment & Plan:  Prediabetes - Plan: Urinalysis, Routine w reflex microscopic, CBC with Differential/Platelet, COMPLETE METABOLIC PANEL WITHOUT GFR, Lipid panel, Hemoglobin A1c  Urinary frequency - Plan: Urinalysis, Routine w reflex microscopic  Essential hypertension  Pure hypercholesterolemia I believe the mild elevated albumin to creatinine ratio is due to age  and longstanding history of hypertension.  However her GFR is excellent and she is on maximum dose of losartan.  I see no indication to add Farxiga or kerendia.  I will check her A1c along with her fasting lipid panel.

## 2023-12-14 ENCOUNTER — Other Ambulatory Visit: Payer: Self-pay | Admitting: Family Medicine

## 2023-12-14 LAB — COMPLETE METABOLIC PANEL WITHOUT GFR
AG Ratio: 1.1 (calc) (ref 1.0–2.5)
ALT: 14 U/L (ref 6–29)
AST: 29 U/L (ref 10–35)
Albumin: 4 g/dL (ref 3.6–5.1)
Alkaline phosphatase (APISO): 42 U/L (ref 37–153)
BUN: 9 mg/dL (ref 7–25)
CO2: 27 mmol/L (ref 20–32)
Calcium: 10.1 mg/dL (ref 8.6–10.4)
Chloride: 100 mmol/L (ref 98–110)
Creat: 0.63 mg/dL (ref 0.60–1.00)
Globulin: 3.6 g/dL (ref 1.9–3.7)
Glucose, Bld: 91 mg/dL (ref 65–99)
Potassium: 4.4 mmol/L (ref 3.5–5.3)
Sodium: 137 mmol/L (ref 135–146)
Total Bilirubin: 0.9 mg/dL (ref 0.2–1.2)
Total Protein: 7.6 g/dL (ref 6.1–8.1)

## 2023-12-14 LAB — CBC WITH DIFFERENTIAL/PLATELET
Absolute Lymphocytes: 1920 {cells}/uL (ref 850–3900)
Absolute Monocytes: 538 {cells}/uL (ref 200–950)
Basophils Absolute: 38 {cells}/uL (ref 0–200)
Basophils Relative: 0.8 %
Eosinophils Absolute: 120 {cells}/uL (ref 15–500)
Eosinophils Relative: 2.5 %
HCT: 41.9 % (ref 35.0–45.0)
Hemoglobin: 13.2 g/dL (ref 11.7–15.5)
MCH: 29.2 pg (ref 27.0–33.0)
MCHC: 31.5 g/dL — ABNORMAL LOW (ref 32.0–36.0)
MCV: 92.7 fL (ref 80.0–100.0)
MPV: 9.8 fL (ref 7.5–12.5)
Monocytes Relative: 11.2 %
Neutro Abs: 2184 {cells}/uL (ref 1500–7800)
Neutrophils Relative %: 45.5 %
Platelets: 229 10*3/uL (ref 140–400)
RBC: 4.52 10*6/uL (ref 3.80–5.10)
RDW: 12.6 % (ref 11.0–15.0)
Total Lymphocyte: 40 %
WBC: 4.8 10*3/uL (ref 3.8–10.8)

## 2023-12-14 LAB — HEMOGLOBIN A1C
Hgb A1c MFr Bld: 6.2 % — ABNORMAL HIGH (ref <5.7)
Mean Plasma Glucose: 131 mg/dL
eAG (mmol/L): 7.3 mmol/L

## 2023-12-14 LAB — LIPID PANEL
Cholesterol: 156 mg/dL (ref <200)
HDL: 67 mg/dL (ref >=50)
LDL Cholesterol (Calc): 76 mg/dL
Non-HDL Cholesterol (Calc): 89 mg/dL (ref <130)
Total CHOL/HDL Ratio: 2.3 (calc) (ref <5.0)
Triglycerides: 51 mg/dL (ref <150)

## 2023-12-14 NOTE — Telephone Encounter (Signed)
 Requested medication (s) are due for refill today - yes  Requested medication (s) are on the active medication list -yes  Future visit scheduled -yes  Last refill: 11/19/23 #60  Notes to clinic: non delegated Rx  Requested Prescriptions  Pending Prescriptions Disp Refills   ALPRAZolam  (XANAX ) 1 MG tablet [Pharmacy Med Name: ALPRAZOLAM  1 MG TABLET] 60 tablet 0    Sig: TAKE 1 TABLET BY MOUTH TWICE A DAY     Not Delegated - Psychiatry: Anxiolytics/Hypnotics 2 Failed - 12/14/2023  1:00 PM      Failed - This refill cannot be delegated      Failed - Urine Drug Screen completed in last 360 days      Failed - Valid encounter within last 6 months    Recent Outpatient Visits           Yesterday Prediabetes   [place] [facility] [name], MD   8 months ago Essential hypertension   [place] [facility] Pickard, Butler DASEN, MD   1 year ago Viral URI with cough   [place] [facility] Kayla Jeoffrey RAMAN, FNP   1 year 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) [facility])   [place] [facility] Pickard, Butler DASEN, MD   1 year ago Essential hypertension   [place] [facility] Pickard, Butler DASEN, MD       Future Appointments             In 3 months Luke Orlan HERO, DO [place] [facility] [place] [facility] - Patient is not pregnant         Requested Prescriptions  Pending Prescriptions Disp Refills   ALPRAZolam  (XANAX ) 1 MG tablet [Pharmacy Med Name: ALPRAZOLAM  1 MG TABLET] 60 tablet 0    Sig: TAKE 1 TABLET BY MOUTH TWICE A DAY     Not Delegated - Psychiatry: Anxiolytics/Hypnotics 2 Failed - 12/14/2023  1:00 PM      Failed - This refill cannot be delegated      Failed - Urine Drug Screen completed in last 360 days      Failed - Valid encounter within last 6 months    Recent Outpatient Visits           Yesterday Prediabetes   [facility] [name], MD   8 months ago Essential hypertension   [place] [facility] Duanne, Butler DASEN, MD   1 year ago Viral URI with cough   [place] [facility] Kayla Jeoffrey RAMAN, FNP   1 year 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

## 2023-12-21 ENCOUNTER — Other Ambulatory Visit: Payer: Self-pay | Admitting: Physician Assistant

## 2024-01-14 ENCOUNTER — Ambulatory Visit (INDEPENDENT_AMBULATORY_CARE_PROVIDER_SITE_OTHER): Payer: Self-pay

## 2024-01-14 DIAGNOSIS — J309 Allergic rhinitis, unspecified: Secondary | ICD-10-CM | POA: Diagnosis not present

## 2024-01-15 ENCOUNTER — Other Ambulatory Visit: Payer: Self-pay | Admitting: Family Medicine

## 2024-01-15 MED ORDER — ZOLPIDEM TARTRATE 10 MG PO TABS: ORAL_TABLET | ORAL | 0 refills | Status: AC

## 2024-01-15 NOTE — Telephone Encounter (Signed)
 Requested medication (s) are due for refill today - yes  Requested medication (s) are on the active medication list -yes  Future visit scheduled -yes  Last refill: 12/13/23 #30  Notes to clinic: non delegated Rx  Requested Prescriptions  Pending Prescriptions Disp Refills   zolpidem  (AMBIEN ) 10 MG tablet 30 tablet 0    Sig: TAKE 1 TABLET BY MOUTH EVERY DAY AT BEDTIME AS NEEDED FOR SLEEP     Not Delegated - Psychiatry:  Anxiolytics/Hypnotics Failed - 01/15/2024  2:14 PM      Failed - This refill cannot be delegated      Failed - Urine Drug Screen completed in last 360 days      Failed - Valid encounter within last 6 months    Recent Outpatient Visits           1 month ago Prediabetes   [place] [facility] Cheril Cork, Cisco Crest, MD   9 months ago Essential hypertension   [place] [facility] Pickard, Cisco Crest, MD   1 year ago Viral URI with cough   [place] [facility] Jenelle Mis, FNP   1 year 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) [facility])   [place] [facility] Pickard, Cisco Crest, MD   1 year ago Essential hypertension   [place] [facility] Pickard, Cisco Crest, MD       Future Appointments             In 2 months [place] Carolin Archibald Kobus, DO [place] [facility] [place] at [facility]               Requested Prescriptions  Pending Prescriptions Disp Refills   zolpidem  (AMBIEN ) 10 MG tablet 30 tablet 0    Sig: TAKE 1 TABLET BY MOUTH EVERY DAY AT BEDTIME AS NEEDED FOR SLEEP     Not Delegated - Psychiatry:  Anxiolytics/Hypnotics Failed - 01/15/2024  2:14 PM      Failed - This refill cannot be delegated      Failed - Urine Drug Screen completed in last 360 days      Failed - Valid encounter within last 6 months    Recent Outpatient Visits           1 month ago Prediabetes   [place] [facility] Austine Lefort, MD   9 months  ago Essential hypertension   [place] [facility] Cheril Cork, Cisco Crest, MD   1 year ago Viral URI with cough   [place] [facility] Jenelle Mis, FNP   1 year 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) [facility])   [place] [facility] Austine Lefort, MD   1 year ago Essential hypertension   [place] [facility] Pickard, Cisco Crest, MD       Future Appointments             In 2 months Burdette Carolin Archibald Kobus, DO  [facility] [place] [facility]

## 2024-01-15 NOTE — Telephone Encounter (Signed)
 Copied from CRM [id number]. Topic: Clinical - Medication Refill >> Jan 15, 2024 11:08 AM Opal Bill wrote: Medication: zolpidem  (AMBIEN ) 10 MG tablet  Has the patient contacted their pharmacy? No. No refills left  This is the patient's preferred pharmacy:  [id number] [place], [place] - [address] [place] [address] Phone: [phone] Fax: [phone]  Is this the correct pharmacy for this prescription? Yes If no, delete pharmacy and type the correct one.   Has the prescription been filled recently? Yes  Is the patient out of the medication? No  Has the patient been seen for an appointment in the last year OR does the patient have an upcoming appointment? Yes  Can we respond through MyChart? No  Agent: Please be advised that Rx refills may take up to 3 business days. We ask that you follow-up with your pharmacy.

## 2024-01-15 NOTE — Telephone Encounter (Signed)
 Copied from CRM [id number]. Topic: Clinical - Medication Refill >> Jan 15, 2024 11:17 AM Opal Bill wrote: Medication: ALPRAZolam  (XANAX ) 1 MG tablet  Has the patient contacted their pharmacy? No, out of refills.   This is the patient's preferred pharmacy:  [id number] [place], [place] - [address] [place] [address] Phone: [phone] Fax: [phone]  Is this the correct pharmacy for this prescription? Yes If no, delete pharmacy and type the correct one.   Has the prescription been filled recently? Yes  Is the patient out of the medication? No  Has the patient been seen for an appointment in the last year OR does the patient have an upcoming appointment? Yes  Can we respond through MyChart? No  Agent: Please be advised that Rx refills may take up to 3 business days. We ask that you follow-up with your pharmacy.

## 2024-01-16 ENCOUNTER — Other Ambulatory Visit: Payer: Self-pay | Admitting: Family Medicine

## 2024-01-16 NOTE — Telephone Encounter (Signed)
 Requested medication (s) are due for refill today- yes  Requested medication (s) are on the active medication list -yes  Future visit scheduled -yes  Last refill: 12/14/23 #60  Notes to clinic: non delegated Rx  Requested Prescriptions  Pending Prescriptions Disp Refills   ALPRAZolam  (XANAX ) 1 MG tablet 60 tablet 0    Sig: Take 1 tablet (1 mg total) by mouth 2 (two) times daily.     Not Delegated - Psychiatry: Anxiolytics/Hypnotics 2 Failed - 01/16/2024  2:17 PM      Failed - This refill cannot be delegated      Failed - Urine Drug Screen completed in last 360 days      Failed - Valid encounter within last 6 months    Recent Outpatient Visits           1 month ago Prediabetes   [place] [facility] Pickard, Cisco Crest, MD   9 months ago Essential hypertension   [place] [facility] Pickard, Cisco Crest, MD   1 year ago Viral URI with cough   [place] [facility] Jenelle Mis, FNP   1 year 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) [facility])   [place] [facility] Pickard, Cisco Crest, MD   1 year ago Essential hypertension   [place] [facility] Austine Lefort, MD       Future Appointments             In 2 months Trudy Fusi, DO [place] [facility] [place] at [facility] - Patient is not pregnant         Requested Prescriptions  Pending Prescriptions Disp Refills   ALPRAZolam  (XANAX ) 1 MG tablet 60 tablet 0    Sig: Take 1 tablet (1 mg total) by mouth 2 (two) times daily.     Not Delegated - Psychiatry: Anxiolytics/Hypnotics 2 Failed - 01/16/2024  2:17 PM      Failed - This refill cannot be delegated      Failed - Urine Drug Screen completed in last 360 days      Failed - Valid encounter within last 6 months    Recent Outpatient Visits           1 month ago Prediabetes   [place] [facility] Cheril Cork,  Cisco Crest, MD   9 months ago Essential hypertension   [facility] Cheril Cork, Cisco Crest, MD   1 year ago Viral URI with cough   [facility] Jenelle Mis, FNP   1 year 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) [facility])   [place] [facility] Austine Lefort, MD   1 year ago Essential hypertension   [place] [facility] Pickard, Cisco Crest, MD       Future Appointments             In 2 months Bur[place] Carolin Archibald Kobus, DO  [facility]  at [facility] - Patient is not pregnant

## 2024-01-17 MED ORDER — ALPRAZOLAM 1 MG PO TABS: 1.0000 mg | ORAL_TABLET | Freq: Two times a day (BID) | ORAL | 0 refills | Status: DC

## 2024-01-23 ENCOUNTER — Telehealth: Payer: Self-pay

## 2024-01-23 NOTE — Telephone Encounter (Signed)
 Call from Laie, with [employer] with [employer]. Pt had an abnormal UACR with them. They are going to fax the results to our office.

## 2024-01-28 NOTE — Progress Notes (Signed)
 VIALS MADE 01-28-24

## 2024-01-29 DIAGNOSIS — J3081 Allergic rhinitis due to animal (cat) (dog) hair and dander: Secondary | ICD-10-CM | POA: Diagnosis not present

## 2024-01-30 ENCOUNTER — Telehealth: Payer: Self-pay

## 2024-01-30 DIAGNOSIS — J3089 Other allergic rhinitis: Secondary | ICD-10-CM | POA: Diagnosis not present

## 2024-01-30 NOTE — Telephone Encounter (Signed)
 Abstracted at home uACR from [facility] Calls visit. Mjp,lpn

## 2024-01-31 ENCOUNTER — Ambulatory Visit: Payer: 59 | Admitting: *Deleted

## 2024-01-31 DIAGNOSIS — Z0000 Encounter for general adult medical examination without abnormal findings: Secondary | ICD-10-CM

## 2024-01-31 NOTE — Progress Notes (Signed)
 Subjective:   Madeline Wood is a 78 y.o. female who presents for Medicare Annual (Subsequent) preventive examination.  Visit Complete: Virtual I connected with  [name] on 01/31/24 by a audio enabled telemedicine application and verified that I am speaking with the correct person using two identifiers.  Patient Location: Home  Provider Location: Home Office  I discussed the limitations of evaluation and management by telemedicine. The patient expressed understanding and agreed to proceed.  Vital Signs: Because this visit was a virtual/telehealth visit, some criteria may be missing or patient reported. Any vitals not documented were not able to be obtained and vitals that have been documented are patient reported.   Cardiac Risk Factors include: advanced age (>85men, >68 women);hypertension     Objective:     There were no vitals filed for this visit. There is no height or weight on file to calculate BMI.     01/31/2024    2:51 PM 01/25/2023   10:32 AM 02/16/2022   10:15 AM 12/24/2020   10:39 AM 04/14/2020    8:14 PM 07/27/2017    7:35 PM  Advanced Directives  Does Patient Have a Medical Advance Directive? No No Yes Yes No No;Yes  Type of Best boy of [place];Living will Healthcare Power of [place];Living will  Healthcare Power of Attorney  Does patient want to make changes to medical advance directive?    No - Patient declined    Copy of Healthcare Power of Attorney in Chart?   No - copy requested No - copy requested    Would patient like information on creating a medical advance directive? No - Patient declined Yes (MAU/Ambulatory/Procedural Areas - Information given)  No - Patient declined No - Patient declined No - Patient declined    Current Medications (verified) Outpatient Encounter Medications as of 01/31/2024  Medication Sig   albuterol  (VENTOLIN  HFA) 108 (90 Base) MCG/ACT inhaler TAKE 2 PUFFS INTO LUNGS EVERY 6 HOURS AS NEEDED FOR  WHEEZE OR SHORTNESS OF BREATH   ALPRAZolam  (XANAX ) 1 MG tablet TAKE 1 TABLET BY MOUTH TWICE A DAY   ALPRAZolam  (XANAX ) 1 MG tablet Take 1 tablet (1 mg total) by mouth 2 (two) times daily.   amLODipine  (NORVASC ) 5 MG tablet Take 1 tablet (5 mg total) by mouth daily.   aspirin 81 MG tablet Take 81 mg by mouth daily.   atorvastatin  (LIPITOR) 80 MG tablet TAKE 1 TABLET BY MOUTH EVERY DAY   colchicine  0.6 MG tablet PLEASE SEE ATTACHED FOR DETAILED DIRECTIONS   cyclobenzaprine  (FLEXERIL ) 5 MG tablet TAKE 1 TABLET BY MOUTH THREE TIMES A DAY AS NEEDED FOR MUSCLE SPASMS   EPINEPHrine  0.3 mg/0.3 mL IJ SOAJ injection USE AS DIRECTED FOR LIFE THREATENING ALLERGIC REACTIONS   ezetimibe  (ZETIA ) 10 MG tablet Take 1 tablet (10 mg total) by mouth daily.   fexofenadine  (ALLEGRA ) 180 MG tablet Take 1 tablet (180 mg total) by mouth daily.   fish oil-omega-3 fatty acids 1000 MG capsule Take 1 g by mouth. Takes occasionally   fluticasone  (FLONASE ) 50 MCG/ACT nasal spray PLACE 1-2 SPRAYS INTO BOTH NOSTRILS DAILY AS NEEDED FOR ALLERGIES OR RHINITIS.   losartan  (COZAAR ) 100 MG tablet Take 1 tablet (100 mg total) by mouth daily.   metoprolol  tartrate (LOPRESSOR ) 50 MG tablet TAKE 1 TABLET BY MOUTH EVERY MORNING AND 1 AND 1/2 TABLET EVERY EVENING   Multiple Vitamin (MULTIVITAMIN) tablet Take 1 tablet by mouth. occasionally   nitroGLYCERIN  (NITROSTAT ) 0.4 MG  SL tablet PLACE 1 TABLET UNDER THE TONGUE EVERY 5 MINUTES AS NEEDED FOR CHEST PAIN.   pantoprazole  (PROTONIX ) 40 MG tablet Take 1 tablet (40 mg total) by mouth daily.   Wheat Dextrin (BENEFIBER PO) Take by mouth. Daily   zolpidem  (AMBIEN ) 10 MG tablet TAKE 1 TABLET BY MOUTH EVERY DAY AT BEDTIME AS NEEDED FOR SLEEP   [DISCONTINUED] omeprazole  (PRILOSEC  OTC) 20 MG tablet Take 1 tablet (20 mg total) by mouth daily.   No facility-administered encounter medications on file as of 01/31/2024.    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    Angio-edema    Anxiety    Arthritis    Blood transfusion without reported diagnosis    1985 with hysterectomy    CAD (coronary artery disease)    stent of distal RCA in 2006   Diabetes mellitus without complication (HCC)    diet controlled-    Diverticulosis    GERD (gastroesophageal reflux disease)    Gout    H/O heart artery stent    Hiatal hernia    History of nuclear stress test 03/2011   negative lexiscan myoview; normal perfusion   Hyperlipidemia    Hypertension    Knee pain    Neuromuscular disorder (HCC)    Hiatal Hernia    Obesity    Positive TB test    Venous insufficiency    Past Surgical History:  Procedure Laterality Date   ABDOMINAL HYSTERECTOMY     BACK SURGERY  2012   BREAST LUMPECTOMY     COLONOSCOPY  2011   CORONARY ANGIOPLASTY WITH STENT PLACEMENT  06/29/2005   Taxus 2.5x54mm DES to distal RCA (Dr. Silvestre Drum)   TRANSTHORACIC ECHOCARDIOGRAM  12/20/2012   EF 50-55%, normal systolic function, mild hypokinesis of inf myocardium; calcified MV annulua, LA & RA mildly dilated   Family History  Problem Relation Age of Onset   Ovarian cancer Mother    Other Father        homicide   Diabetes Sister    Hypertension Sister    Pancreatic cancer Brother    Heart attack Brother        x2   Hypertension Brother    Esophageal cancer Brother    Prostate cancer Brother    Colon cancer Neg Hx    Colon polyps Neg Hx    Rectal cancer Neg Hx    Stomach cancer Neg Hx    Social History   Socioeconomic History   Marital status: Single    Spouse name: Not on file   Number of children: 1   Years of education: Not on file   Highest education level: Not on file  Occupational History   Occupation: Field seismologist  Tobacco Use   Smoking status: Former     Current packs/day: 0.00    Types: Cigarettes    Quit date: 12/20/2001    Years since quitting: 22.1   Smokeless tobacco: Never  Vaping Use   Vaping status: Never Used  Substance and Sexual Activity   Alcohol use: No    Alcohol/week: 0.0 standard drinks of alcohol   Drug use: No   Sexual activity: Not on file  Other Topics Concern   Not on file  Social History Narrative   Not on file   Social Drivers  of Health   Financial Resource Strain: Low Risk  (01/31/2024)   Overall Financial Resource Strain (CARDIA)    Difficulty of Paying Living Expenses: Not hard at all  Food Insecurity: No Food Insecurity (01/31/2024)   Hunger Vital Sign    Worried About Running Out of Food in the Last Year: Never true    Ran Out of Food in the Last Year: Never true  Transportation Needs: No Transportation Needs (01/31/2024)   PRAPARE - Administrator, Civil Service (Medical): No    Lack of Transportation (Non-Medical): No  Physical Activity: Inactive (01/31/2024)   Exercise Vital Sign    Days of Exercise per Week: 0 days    Minutes of Exercise per Session: 0 min  Stress: No Stress Concern Present (01/31/2024)   [employer] of Occupational Health - Occupational Stress Questionnaire    Feeling of Stress : Not at all  Social Connections: Moderately Isolated (01/31/2024)   Social Connection and Isolation Panel [NHANES]    Frequency of Communication with Friends and Family: More than three times a week    Frequency of Social Gatherings with Friends and Family: Twice a week    Attends Religious Services: More than 4 times per year    Active Member of [employer] or Organizations: No    Attends Banker Meetings: Never    Marital Status: Widowed    Tobacco Counseling Counseling given: Not Answered   Clinical Intake:  Pre-visit preparation completed: Yes  Pain : No/denies pain     Diabetes: No  How often do you need to have someone help you when you read instructions, pamphlets,  or other written materials from your doctor or pharmacy?: 1 - Never  Interpreter Needed?: No  Information entered by :: Kieth Pelt LPN   Activities of Daily Living    01/31/2024    2:51 PM  In your present state of health, do you have any difficulty performing the following activities:  Hearing? 0  Vision? 0  Difficulty concentrating or making decisions? 0  Walking or climbing stairs? 0  Dressing or bathing? 0  Doing errands, shopping? 0  Preparing Food and eating ? N  Using the Toilet? N  In the past six months, have you accidently leaked urine? N  Do you have problems with loss of bowel control? N  Managing your Medications? N  Managing your Finances? N  Housekeeping or managing your Housekeeping? N    Patient Care Team: Austine Lefort, MD as PCP - General (Family Medicine) Maximo Spar Aviva Lemmings, MD as PCP - Cardiology (Cardiology) Austine Lefort, MD (Family Medicine) Myrle Aspen, [facility] (Inactive) as Pharmacist (Pharmacist) Luella Sager, [name] as Consulting Physician (Podiatry) Idolina Maker Mirna Amis, MD as Consulting Physician (Allergy  and Immunology) [facility] Belle Box, MD as Consulting Physician (Obstetrics and Gynecology)  Indicate any recent Medical Services you may have received from other than [facility] providers in the past year (date may be approximate).     Assessment:    This is a routine wellness examination for Chyna.  Hearing/Vision screen Hearing Screening - Comments:: No trouble hearing Vision Screening - Comments:: [facility] Up to date   Goals Addressed             This Visit's Progress    Patient Stated       [place] current lifestyle     Remain active and independent   On track      Depression Screen  01/31/2024    2:57 PM 12/13/2023    9:17 AM 01/25/2023   10:33 AM 02/16/2022   10:11 AM 12/24/2020   10:37 AM 10/15/2018   10:15 AM 02/27/2018   11:41 AM  PHQ 2/9 Scores  PHQ - 2 Score 0 1 0 0 0 0 0  PHQ-  9 Score 0          Fall Risk    01/31/2024    2:49 PM 12/13/2023    9:17 AM 01/25/2023   10:32 AM 02/16/2022   10:15 AM 12/24/2020   10:37 AM  Fall Risk   Falls in the past year? 0 0 0 0 0  Number falls in past yr: 0 0 0 0   Injury with Fall? 0 0 0 0   Risk for fall due to :  No Fall Risks No Fall Risks No Fall Risks No Fall Risks  Follow up Falls evaluation completed;Education provided;Falls prevention discussed Falls prevention discussed;Falls evaluation completed Falls prevention discussed;Education provided;Falls evaluation completed Falls prevention discussed Falls evaluation completed    MEDICARE RISK AT HOME: Medicare Risk at Home Any stairs in or around the home?: No If so, are there any without handrails?: No Home free of loose throw rugs in walkways, pet beds, electrical cords, etc?: Yes Adequate lighting in your home to reduce risk of falls?: Yes Life alert?: No Use of a cane, walker or w/c?: No Grab bars in the bathroom?: No Shower chair or bench in shower?: Yes Elevated toilet seat or a handicapped toilet?: Yes  TIMED UP AND GO:  Was the test performed?  No    Cognitive Function:        01/31/2024    3:43 PM 01/31/2024    2:52 PM 01/25/2023   10:32 AM 02/16/2022   10:17 AM  6CIT Screen  What Year? 0 points 0 points 0 points 0 points  What month? 0 points 0 points 0 points 0 points  What time? 0 points 0 points 0 points 0 points  Count back from 20 0 points 2 points 0 points 0 points  Months in reverse 0 points 2 points 0 points 0 points  Repeat phrase 10 points 10 points 4 points 4 points  Total Score 10 points 14 points 4 points 4 points    Immunizations Immunization History  Administered Date(s) Administered   Fluad Quad(high Dose 65+) 05/20/2019, 07/20/2021, 07/31/2022   H1N1 08/03/2008   Influenza Whole 05/28/2008, 07/07/2011   Influenza, High Dose Seasonal PF 06/04/2017, 05/29/2018, 07/14/2020, 06/29/2023   Influenza,inj,Quad PF,6+ Mos 05/28/2013,  07/02/2014, 06/01/2015, 05/24/2016   Influenza-Unspecified 06/09/2008, 06/03/2009, 05/19/2010   Pfizer Covid-19 Vaccine Bivalent Booster 3yrs & up 09/08/2021   Pneumococcal Conjugate-13 08/18/2013   Pneumococcal Polysaccharide-23 02/25/2014   Td 05/13/2007, 01/25/2012   Tdap 01/25/2012   Unspecified SARS-COV-2 Vaccination 10/20/2019, 10/31/2019, 06/28/2020   Zoster Recombinant(Shingrix) 12/24/2023   Zoster, Live 07/02/2014    TDAP status: Due, Education has been provided regarding the importance of this vaccine. Advised may receive this vaccine at local pharmacy or Health Dept. Aware to provide a copy of the vaccination record if obtained from local pharmacy or Health Dept. Verbalized acceptance and understanding.  Flu Vaccine status: Up to date  Pneumococcal vaccine status: Up to date  Covid-19 vaccine status: Information provided on how to obtain vaccines.   Qualifies for Shingles Vaccine? Yes   Zostavax completed No   Shingrix Completed?: No.    Education has been provided regarding the importance of this  vaccine. Patient has been advised to call insurance company to determine out of pocket expense if they have not yet received this vaccine. Advised may also receive vaccine at local pharmacy or Health Dept. Verbalized acceptance and understanding.  Screening Tests Health Maintenance  Topic Date Due   COVID-19 Vaccine (5 - 2024-25 season) 02/15/2024 (Originally 04/29/2023)   OPHTHALMOLOGY EXAM  05/21/2024 (Originally 07/14/2021)   DTaP/Tdap/Td (4 - Td or Tdap) 01/29/2025 (Originally 01/24/2022)   Zoster Vaccines- Shingrix (2 of 2) 02/18/2024   INFLUENZA VACCINE  03/28/2024   HEMOGLOBIN A1C  06/13/2024   FOOT EXAM  06/19/2024   Diabetic kidney evaluation - Urine ACR  11/12/2024   Diabetic kidney evaluation - eGFR measurement  12/12/2024   Medicare Annual Wellness (AWV)  01/30/2025   Pneumonia Vaccine 90+ Years old  Completed   DEXA SCAN  Completed   Hepatitis C Screening   Completed   HPV VACCINES  Aged Out   Meningococcal B Vaccine  Aged Out   Colonoscopy  Discontinued    Health Maintenance  There are no preventive care reminders to display for this patient.  Colorectal cancer screening: No longer required.   Mammogram status: No longer required due to age.  Bone Density status: Completed 2021. Results reflect: Bone density results: NORMAL. Repeat every 5 years.  Lung Cancer Screening: (Low Dose CT Chest recommended if Age 1-80 years, 20 pack-year currently smoking OR have quit w/in 15years.) does not qualify.   Lung Cancer Screening Referral:   Additional Screening:  Hepatitis C Screening: does not qualify;    2020  Vision Screening: Recommended annual ophthalmology exams for early detection of glaucoma and other disorders of the eye. Is the patient up to date with their annual eye exam?  Yes  Who is the provider or what is the name of the office in which the patient attends annual eye exams? [facility] If pt is not established with a provider, would they like to be referred to a provider to establish care? No .   Dental Screening: Recommended annual dental exams for proper oral hygiene  Patient sees [facility] to get toe nails cut not on an medication for Diabetes  Community Resource Referral / Chronic Care Management: CRR required this visit?  No   CCM required this visit?  No     Plan:     I have personally reviewed and noted the following in the patient's chart:   Medical and social history Use of alcohol, tobacco or illicit drugs  Current medications and supplements including opioid prescriptions. Patient is not currently taking opioid prescriptions. Functional ability and status Nutritional status Physical activity Advanced directives List of other physicians Hospitalizations, surgeries, and ER visits in previous 12 months Vitals Screenings to include cognitive, depression, and falls Referrals and appointments  In  addition, I have reviewed and discussed with patient certain preventive protocols, quality metrics, and best practice recommendations. A written personalized care plan for preventive services as well as general preventive health recommendations were provided to patient.     Kieth Pelt, LPN   11/29/345   After Visit Summary: (MyChart) Due to this being a telephonic visit, the after visit summary with patients personalized plan was offered to patient via MyChart   Nurse Notes:

## 2024-01-31 NOTE — Patient Instructions (Signed)
 Madeline Wood , Thank you for taking time to come for your Medicare Wellness Visit. I appreciate your ongoing commitment to your health goals. Please review the following plan we discussed and let me know if I can assist you in the future.   Screening recommendations/referrals: Colonoscopy: no longer required Mammogram: Education provided Bone Density: up to date Recommended yearly ophthalmology/optometry visit for glaucoma screening and checkup Recommended yearly dental visit for hygiene and checkup  Vaccinations: Influenza vaccine: up to date Pneumococcal vaccine: up to date Tdap vaccine: up to date      Preventive Care 65 Years and Older, Female Preventive care refers to lifestyle choices and visits with your health care provider that can promote health and wellness. What does preventive care include? A yearly physical exam. This is also called an annual well check. Dental exams once or twice a year. Routine eye exams. Ask your health care provider how often you should have your eyes checked. Personal lifestyle choices, including: Daily care of your teeth and gums. Regular physical activity. Eating a healthy diet. Avoiding tobacco and drug use. Limiting alcohol use. Practicing safe sex. Taking low-dose aspirin every day. Taking vitamin and mineral supplements as recommended by your health care provider. What happens during an annual well check? The services and screenings done by your health care provider during your annual well check will depend on your age, overall health, lifestyle risk factors, and family history of disease. Counseling  Your health care provider may ask you questions about your: Alcohol use. Tobacco use. Drug use. Emotional well-being. Home and relationship well-being. Sexual activity. Eating habits. History of falls. Memory and ability to understand (cognition). Work and work Astronomer. Reproductive health. Screening  You may have the following  tests or measurements: Height, weight, and BMI. Blood pressure. Lipid and cholesterol levels. These may be checked every 5 years, or more frequently if you are over 90 years old. Skin check. Lung cancer screening. You may have this screening every year starting at age 42 if you have a 30-pack-year history of smoking and currently smoke or have quit within the past 15 years. Fecal occult blood test (FOBT) of the stool. You may have this test every year starting at age 30. Flexible sigmoidoscopy or colonoscopy. You may have a sigmoidoscopy every 5 years or a colonoscopy every 10 years starting at age 76. Hepatitis C blood test. Hepatitis B blood test. Sexually transmitted disease (STD) testing. Diabetes screening. This is done by checking your blood sugar (glucose) after you have not eaten for a while (fasting). You may have this done every 1-3 years. Bone density scan. This is done to screen for osteoporosis. You may have this done starting at age 39. Mammogram. This may be done every 1-2 years. Talk to your health care provider about how often you should have regular mammograms. Talk with your health care provider about your test results, treatment options, and if necessary, the need for more tests. Vaccines  Your health care provider may recommend certain vaccines, such as: Influenza vaccine. This is recommended every year. Tetanus, diphtheria, and acellular pertussis (Tdap, Td) vaccine. You may need a Td booster every 10 years. Zoster vaccine. You may need this after age 39. Pneumococcal 13-valent conjugate (PCV13) vaccine. One dose is recommended after age 7. Pneumococcal polysaccharide (PPSV23) vaccine. One dose is recommended after age 80. Talk to your health care provider about which screenings and vaccines you need and how often you need them. This information is not intended to replace  advice given to you by your health care provider. Make sure you discuss any questions you have with  your health care provider. Document Released: 09/10/2015 Document Revised: 05/03/2016 Document Reviewed: 06/15/2015 Elsevier Interactive Patient Education  2017 [employer].  Fall Prevention in the Home Falls can cause injuries. They can happen to people of all ages. There are many things you can do to make your home safe and to help prevent falls. What can I do on the outside of my home? Regularly fix the edges of walkways and driveways and fix any cracks. Remove anything that might make you trip as you walk through a door, such as a raised step or threshold. Trim any bushes or trees on the path to your home. Use bright outdoor lighting. Clear any walking paths of anything that might make someone trip, such as rocks or tools. Regularly check to see if handrails are loose or broken. Make sure that both sides of any steps have handrails. Any raised decks and porches should have guardrails on the edges. Have any leaves, snow, or ice cleared regularly. Use sand or salt on walking paths during winter. Clean up any spills in your garage right away. This includes oil or grease spills. What can I do in the bathroom? Use night lights. Install grab bars by the toilet and in the tub and shower. Do not use towel bars as grab bars. Use non-skid mats or decals in the tub or shower. If you need to sit down in the shower, use a plastic, non-slip stool. Keep the floor dry. Clean up any water that spills on the floor as soon as it happens. Remove soap buildup in the tub or shower regularly. Attach bath mats securely with double-sided non-slip rug tape. Do not have throw rugs and other things on the floor that can make you trip. What can I do in the bedroom? Use night lights. Make sure that you have a light by your bed that is easy to reach. Do not use any sheets or blankets that are too big for your bed. They should not hang down onto the floor. Have a firm chair that has side arms. You can use this  for support while you get dressed. Do not have throw rugs and other things on the floor that can make you trip. What can I do in the kitchen? Clean up any spills right away. Avoid walking on wet floors. Keep items that you use a lot in easy-to-reach places. If you need to reach something above you, use a strong step stool that has a grab bar. Keep electrical cords out of the way. Do not use floor polish or wax that makes floors slippery. If you must use wax, use non-skid floor wax. Do not have throw rugs and other things on the floor that can make you trip. What can I do with my stairs? Do not leave any items on the stairs. Make sure that there are handrails on both sides of the stairs and use them. Fix handrails that are broken or loose. Make sure that handrails are as long as the stairways. Check any carpeting to make sure that it is firmly attached to the stairs. Fix any carpet that is loose or worn. Avoid having throw rugs at the top or bottom of the stairs. If you do have throw rugs, attach them to the floor with carpet tape. Make sure that you have a light switch at the top of the stairs and the bottom  of the stairs. If you do not have them, ask someone to add them for you. What else can I do to help prevent falls? Wear shoes that: Do not have high heels. Have rubber bottoms. Are comfortable and fit you well. Are closed at the toe. Do not wear sandals. If you use a stepladder: Make sure that it is fully opened. Do not climb a closed stepladder. Make sure that both sides of the stepladder are locked into place. Ask someone to hold it for you, if possible. Clearly mark and make sure that you can see: Any grab bars or handrails. First and last steps. Where the edge of each step is. Use tools that help you move around (mobility aids) if they are needed. These include: Canes. Walkers. Scooters. Crutches. Turn on the lights when you go into a dark area. Replace any light bulbs as  soon as they burn out. Set up your furniture so you have a clear path. Avoid moving your furniture around. If any of your floors are uneven, fix them. If there are any pets around you, be aware of where they are. Review your medicines with your doctor. Some medicines can make you feel dizzy. This can increase your chance of falling. Ask your doctor what other things that you can do to help prevent falls. This information is not intended to replace advice given to you by your health care provider. Make sure you discuss any questions you have with your health care provider. Document Released: 06/10/2009 Document Revised: 01/20/2016 Document Reviewed: 09/18/2014 Elsevier Interactive Patient Education  2017 [employer].

## 2024-02-05 ENCOUNTER — Ambulatory Visit (INDEPENDENT_AMBULATORY_CARE_PROVIDER_SITE_OTHER): Payer: 59 | Admitting: Podiatry

## 2024-02-05 ENCOUNTER — Encounter: Payer: Self-pay | Admitting: Podiatry

## 2024-02-05 DIAGNOSIS — E1151 Type 2 diabetes mellitus with diabetic peripheral angiopathy without gangrene: Secondary | ICD-10-CM | POA: Diagnosis not present

## 2024-02-05 DIAGNOSIS — M79675 Pain in left toe(s): Secondary | ICD-10-CM

## 2024-02-05 DIAGNOSIS — L84 Corns and callosities: Secondary | ICD-10-CM

## 2024-02-05 DIAGNOSIS — B351 Tinea unguium: Secondary | ICD-10-CM

## 2024-02-05 DIAGNOSIS — M79674 Pain in right toe(s): Secondary | ICD-10-CM

## 2024-02-07 ENCOUNTER — Other Ambulatory Visit: Payer: Self-pay | Admitting: Internal Medicine

## 2024-02-10 ENCOUNTER — Encounter: Payer: Self-pay | Admitting: Podiatry

## 2024-02-10 NOTE — Progress Notes (Signed)
  Subjective:  Patient ID: Madeline Wood, female    DOB: May 06, 1946,  MRN: [id number]  Madeline Wood presents to clinic today for at risk foot care. Pt has h/o NIDDM with PAD and painful elongated mycotic toenails 1-5 bilaterally which are tender when wearing enclosed shoe gear. Pain is relieved with periodic professional debridement.  Chief Complaint  Patient presents with   Diabetes    Get my toenails cut.  Dr. Eliane Grooms - 12/13/2023; A1c - 6.2   New problem(s): None.   PCP is Austine Lefort, MD.  Allergies  Allergen Reactions   Citalopram Hydrobromide    Escitalopram Oxalate    Paroxetine    Polysporin [Bacitracin-Polymyxin B]    Ciprofloxacin     Review of Systems: Negative except as noted in the HPI.  Objective: No changes noted in today's physical examination. There were no vitals filed for this visit. Madeline Wood is a pleasant 78 y.o. female in NAD. AAO x 3.  Vascular Examination: CFT less than 3 seconds. DP pulses 2/4 b/l. PT pulses 0/4  b/l. Digital hair absent. Skin temperature gradient warm to warn b/l. No ischemia or gangrene. No cyanosis or clubbing noted b/l. No edema noted b/l LE.   Neurological Examination: Sensation grossly intact b/l with 10 gram monofilament. Vibratory sensation intact b/l.   Dermatological Examination: Pedal skin thin, shiny and atrophic b/l. No open wounds. No interdigital macerations.   Toenails 1-5 b/l thick, discolored, elongated with subungual debris and pain on dorsal palpation.   Hyperkeratotic lesion(s) medial IPJ of right great toe, plantar IPJ of left great toe, and submet head 2 b/l.  No erythema, no edema, no drainage, no fluctuance.  Musculoskeletal Examination: Muscle strength 5/5 to all lower extremity muscle groups bilaterally. HAV with bunion deformity noted b/l LE. Hammertoe deformity noted 2-5 b/l.Aaron Aas No pain, crepitus or joint limitation noted with ROM b/l LE.  Patient ambulates independently without  assistive aids.  Radiographs: None  Last A1c:      Latest Ref Rng & Units 12/13/2023    9:38 AM 04/12/2023   11:17 AM  Hemoglobin A1C  Hemoglobin-A1c <5.7 % 6.2  5.9     Assessment/Plan: 1. Pain due to onychomycosis of toenails of both feet   2. Callus   3. Type II diabetes mellitus with peripheral circulatory disorder [facility])     Consent given for treatment. Patient examined. All patient's and/or POA's questions/concerns addressed on today's visit. Toenails 1-5 debrided in length and girth without incident. Continue foot and shoe inspections daily. Monitor blood glucose per PCP/Endocrinologist's recommendations. Continue soft, supportive shoe gear daily. Report any pedal injuries to medical professional. Call office if there are any questions/concerns. -Patient/POA to call should there be question/concern in the interim.   Return in about 3 months (around 05/07/2024).  Madeline Wood, [name]      [facility] LOCATION: [address] [place] [address]                   Office [phone]   [facility] LOCATION: [address] [place], [place] [address] Office [phone]

## 2024-02-18 ENCOUNTER — Other Ambulatory Visit: Payer: Self-pay

## 2024-02-18 ENCOUNTER — Telehealth: Payer: Self-pay | Admitting: Family Medicine

## 2024-02-18 MED ORDER — ALPRAZOLAM 1 MG PO TABS: 1.0000 mg | ORAL_TABLET | Freq: Two times a day (BID) | ORAL | 0 refills | Status: DC

## 2024-02-18 NOTE — Telephone Encounter (Signed)
 Prescription Request  02/18/2024  LOV: 12/13/2023  What is the name of the medication or equipment?   ALPRAZolam  (XANAX ) 1 MG tablet   Have you contacted your pharmacy to request a refill? Yes   Which pharmacy would you like this sent to?  [id number] [address], [place] - [address] [place] [address] Phone: [phone] Fax: [phone]    Patient notified that their request is being sent to the clinical staff for review and that they should receive a response within 2 business days.   Please advise patient at [phone].

## 2024-02-19 ENCOUNTER — Ambulatory Visit (INDEPENDENT_AMBULATORY_CARE_PROVIDER_SITE_OTHER)

## 2024-02-19 DIAGNOSIS — E119 Type 2 diabetes mellitus without complications: Secondary | ICD-10-CM

## 2024-02-19 DIAGNOSIS — J309 Allergic rhinitis, unspecified: Secondary | ICD-10-CM

## 2024-02-19 LAB — HM DIABETES EYE EXAM

## 2024-02-19 NOTE — Progress Notes (Signed)
 Madeline Wood arrived 02/19/2024 and has given verbal consent to obtain images and complete their overdue diabetic retinal screening.  The images have been sent to an ophthalmologist or optometrist for review and interpretation.  Results will be sent back to [name], MD for review.  Patient has been informed they will be contacted when we receive the results via telephone or MyChart

## 2024-02-22 NOTE — Progress Notes (Signed)
 Received results- No diabetic retinopathy shown. Routed exam to provider. Patient is aware of results and NO referral needed.

## 2024-03-13 ENCOUNTER — Ambulatory Visit: Admitting: Family Medicine

## 2024-03-13 ENCOUNTER — Encounter: Payer: Self-pay | Admitting: Family Medicine

## 2024-03-13 VITALS — BP 140/90 | HR 68 | Temp 98.7°F | Ht 66.0 in | Wt 168.8 lb

## 2024-03-13 DIAGNOSIS — R7303 Prediabetes: Secondary | ICD-10-CM | POA: Diagnosis not present

## 2024-03-13 DIAGNOSIS — I10 Essential (primary) hypertension: Secondary | ICD-10-CM | POA: Diagnosis not present

## 2024-03-13 MED ORDER — AMLODIPINE BESYLATE 5 MG PO TABS
5.0000 mg | ORAL_TABLET | Freq: Every day | ORAL | 1 refills | Status: AC
Start: 2024-03-13 — End: ?

## 2024-03-13 MED ORDER — ALPRAZOLAM 1 MG PO TABS: 1.0000 mg | ORAL_TABLET | Freq: Two times a day (BID) | ORAL | 0 refills | Status: DC

## 2024-03-13 NOTE — Progress Notes (Signed)
 Subjective:    Patient ID: Madeline Wood, female    DOB: [birth year], 78 y.o.   MRN: [id number] Patient has a history of prediabetes.  She is here today to monitor this.  She denies any polyuria polydipsia or blurry vision.  She had a slightly elevated albumin to creatinine ratio in March of 58.  She is here to repeat this.  She denies any chest pain or shortness of breath or dyspnea on exertion.  She is due to refill her Xanax .  I cautioned the patient that Xanax  can cause memory loss and falls.  The patient denies any problems with confusion or disorientation or balance when she takes the medication.  She tries to use it sparingly.  Her blood pressure today is slightly elevated however she has been rushing to get here this morning.  She denies neuropathy in her feet. 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    Angio-edema    Anxiety    Arthritis    Blood transfusion without reported diagnosis    1985 with hysterectomy    CAD (coronary artery disease)    stent of distal RCA in 2006   Diabetes mellitus without complication (HCC)    diet controlled-    Diverticulosis    GERD (gastroesophageal reflux disease)    Gout    H/O heart artery stent    Hiatal hernia    History of nuclear stress test 03/2011   negative lexiscan myoview; normal perfusion   Hyperlipidemia    Hypertension    Knee pain    Neuromuscular disorder (HCC)    Hiatal Hernia    Obesity    Positive TB test    Venous insufficiency    Past Surgical History:  Procedure Laterality Date   ABDOMINAL HYSTERECTOMY     BACK SURGERY  2012   BREAST LUMPECTOMY     COLONOSCOPY  2011   CORONARY ANGIOPLASTY WITH STENT PLACEMENT  06/29/2005   Taxus 2.5x68mm DES to distal RCA (Dr. Herminio)   TRANSTHORACIC ECHOCARDIOGRAM  12/20/2012   EF 50-55%, normal systolic function, mild hypokinesis of inf myocardium; calcified MV annulua, LA & RA  mildly dilated   Current Outpatient Medications on File Prior to Visit  Medication Sig Dispense Refill   aspirin 81 MG tablet Take 81 mg by mouth daily.     atorvastatin  (LIPITOR) 80 MG tablet TAKE 1 TABLET BY MOUTH EVERY DAY 90 tablet 3   EPINEPHrine  0.3 mg/0.3 mL IJ SOAJ injection USE AS DIRECTED FOR LIFE THREATENING ALLERGIC REACTIONS 2 each 1   ezetimibe  (ZETIA ) 10 MG tablet Take 1 tablet (10 mg total) by mouth daily. 90 tablet 3   fexofenadine  (ALLEGRA ) 180 MG tablet Take 1 tablet (180 mg total) by mouth daily. 30 tablet 5   fluticasone  (FLONASE ) 50 MCG/ACT nasal spray PLACE 1-2 SPRAYS INTO BOTH NOSTRILS DAILY AS NEEDED FOR ALLERGIES OR RHINITIS. 48 mL 1   losartan  (COZAAR ) 100 MG tablet Take 1 tablet (100 mg total) by mouth daily. 90 tablet 1   metoprolol  tartrate (LOPRESSOR ) 50 MG tablet TAKE 1 TABLET BY MOUTH EVERY MORNING AND 1 AND 1/2 TABLET EVERY EVENING 225 tablet 2   Multiple Vitamin (MULTIVITAMIN) tablet Take 1 tablet by mouth. occasionally     nitroGLYCERIN  (NITROSTAT ) 0.4 MG SL tablet PLACE 1 TABLET UNDER THE TONGUE EVERY 5 MINUTES AS NEEDED FOR CHEST PAIN. 25 tablet 3  pantoprazole  (PROTONIX ) 40 MG tablet Take 1 tablet (40 mg total) by mouth daily. 90 tablet 1   Wheat Dextrin (BENEFIBER PO) Take by mouth. Daily     zolpidem  (AMBIEN ) 10 MG tablet TAKE 1 TABLET BY MOUTH EVERY DAY AT BEDTIME AS NEEDED FOR SLEEP 30 tablet 0   albuterol  (VENTOLIN  HFA) 108 (90 Base) MCG/ACT inhaler TAKE 2 PUFFS INTO LUNGS EVERY 6 HOURS AS NEEDED FOR WHEEZE OR SHORTNESS OF BREATH (Patient not taking: Reported on 03/13/2024) 8.5 each 3   ALPRAZolam  (XANAX ) 1 MG tablet TAKE 1 TABLET BY MOUTH TWICE A DAY 60 tablet 0   colchicine  0.6 MG tablet PLEASE SEE ATTACHED FOR DETAILED DIRECTIONS (Patient not taking: Reported on 03/13/2024)     cyclobenzaprine  (FLEXERIL ) 5 MG tablet TAKE 1 TABLET BY MOUTH THREE TIMES A DAY AS NEEDED FOR MUSCLE SPASMS (Patient not taking: Reported on 03/13/2024) 20 tablet 0   fish  oil-omega-3 fatty acids 1000 MG capsule Take 1 g by mouth. Takes occasionally (Patient not taking: Reported on 03/13/2024)     No current facility-administered medications on file prior to visit.   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  Tobacco Use   Smoking status: Former    Current packs/day: 0.00    Types: Cigarettes    Quit date: 12/20/2001    Years since quitting: 22.2   Smokeless tobacco: Never  Vaping Use   Vaping status: Never Used  Substance and Sexual Activity   Alcohol use: No    Alcohol/week: 0.0 standard drinks of alcohol   Drug use: No   Sexual activity: Not on file  Other Topics Concern   Not on file  Social History Narrative   Not on file   Social Drivers of Health   Financial Resource Strain: Low Risk  (01/31/2024)   Overall Financial Resource Strain (CARDIA)    Difficulty of Paying Living Expenses: Not hard at all  Food Insecurity: No Food Insecurity (01/31/2024)   Hunger Vital Sign    Worried About Running Out of Food in the Last Year: Never true    Ran Out of Food in the Last Year: Never true  Transportation Needs: No Transportation Needs (01/31/2024)   PRAPARE - Administrator, Civil Service (Medical): No    Lack of Transportation (Non-Medical): No  Physical Activity: Inactive (01/31/2024)   Exercise Vital Sign    Days of Exercise per Week: 0 days    Minutes of Exercise per Session: 0 min  Stress: No Stress Concern Present (01/31/2024)   [employer] of Occupational Health - Occupational Stress Questionnaire    Feeling of Stress : Not at all  Social Connections: Moderately Isolated  (01/31/2024)   Social Connection and Isolation Panel    Frequency of Communication with Friends and Family: More than three times a week    Frequency of Social Gatherings with Friends and Family: Twice a week    Attends Religious Services: More than 4 times per year    Active Member of [employer] or Organizations: No    Attends Banker  Meetings: Never    Marital Status: Widowed  Intimate Partner Violence: Not At Risk (01/31/2024)   Humiliation, Afraid, Rape, and Kick questionnaire    Fear of Current or Ex-Partner: No    Emotionally Abused: No    Physically Abused: No    Sexually Abused: No     Review of Systems  All other systems reviewed and are negative.      Objective:   Physical Exam Constitutional:      General: She is not in acute distress.    Appearance: Normal appearance. She is not ill-appearing, toxic-appearing or diaphoretic.  Cardiovascular:     Rate and Rhythm: Normal rate and regular rhythm.     Pulses: Normal pulses.     Heart sounds: Normal heart sounds. No murmur heard.    No friction rub. No gallop.  Pulmonary:     Effort: Pulmonary effort is normal. No respiratory distress.     Breath sounds: Normal breath sounds. No stridor. No wheezing, rhonchi or rales.  Abdominal:     General: Bowel sounds are normal. There is no distension.     Palpations: Abdomen is soft.     Tenderness: There is no abdominal tenderness. There is no rebound.  Musculoskeletal:     Right lower leg: No edema.     Left lower leg: No edema.  Neurological:     Mental Status: She is alert.         Assessment & Plan:  Prediabetes - Plan: amLODipine  (NORVASC ) 5 MG tablet, ALPRAZolam  (XANAX ) 1 MG tablet, CBC with Differential/Platelet, Comprehensive metabolic panel with GFR, Lipid panel, Hemoglobin A1c, Microalbumin/Creatinine Ratio, Urine  Essential hypertension - Plan: amLODipine  (NORVASC ) 5 MG tablet I will continue to monitor her hemoglobin A1c.  I will repeat her albumin to  creatinine ratio.  If significantly elevated greater than 300, we could add Farxiga.  Blood pressure today is borderline.  She is already on maximum dose losartan .  Check a fasting lipid panel.  Goal LDL cholesterol is less than 100.  Caution the patient to use Xanax  sparingly to avoid dizziness, falls, and confusion

## 2024-03-14 ENCOUNTER — Ambulatory Visit: Payer: Self-pay | Admitting: Family Medicine

## 2024-03-14 LAB — CBC WITH DIFFERENTIAL/PLATELET
Absolute Lymphocytes: 2142 {cells}/uL (ref 850–3900)
Absolute Monocytes: 520 {cells}/uL (ref 200–950)
Basophils Absolute: 41 {cells}/uL (ref 0–200)
Basophils Relative: 0.8 %
Eosinophils Absolute: 92 {cells}/uL (ref 15–500)
Eosinophils Relative: 1.8 %
HCT: 37.8 % (ref 35.0–45.0)
Hemoglobin: 12 g/dL (ref 11.7–15.5)
MCH: 29.8 pg (ref 27.0–33.0)
MCHC: 31.7 g/dL — ABNORMAL LOW (ref 32.0–36.0)
MCV: 93.8 fL (ref 80.0–100.0)
MPV: 9.8 fL (ref 7.5–12.5)
Monocytes Relative: 10.2 %
Neutro Abs: 2305 {cells}/uL (ref 1500–7800)
Neutrophils Relative %: 45.2 %
Platelets: 194 Thousand/uL (ref 140–400)
RBC: 4.03 Million/uL (ref 3.80–5.10)
RDW: 12.2 % (ref 11.0–15.0)
Total Lymphocyte: 42 %
WBC: 5.1 Thousand/uL (ref 3.8–10.8)

## 2024-03-14 LAB — COMPREHENSIVE METABOLIC PANEL WITH GFR
AG Ratio: 1.3 (calc) (ref 1.0–2.5)
ALT: 12 U/L (ref 6–29)
AST: 27 U/L (ref 10–35)
Albumin: 3.9 g/dL (ref 3.6–5.1)
Alkaline phosphatase (APISO): 37 U/L (ref 37–153)
BUN: 8 mg/dL (ref 7–25)
CO2: 27 mmol/L (ref 20–32)
Calcium: 9.5 mg/dL (ref 8.6–10.4)
Chloride: 99 mmol/L (ref 98–110)
Creat: 0.7 mg/dL (ref 0.60–1.00)
Globulin: 2.9 g/dL (ref 1.9–3.7)
Glucose, Bld: 89 mg/dL (ref 65–99)
Potassium: 3.6 mmol/L (ref 3.5–5.3)
Sodium: 134 mmol/L — ABNORMAL LOW (ref 135–146)
Total Bilirubin: 0.8 mg/dL (ref 0.2–1.2)
Total Protein: 6.8 g/dL (ref 6.1–8.1)
eGFR: 88 mL/min/1.73m2 (ref >=60)

## 2024-03-14 LAB — MICROALBUMIN / CREATININE URINE RATIO
Creatinine, Urine: 12 mg/dL — ABNORMAL LOW (ref 20–275)
Microalb Creat Ratio: 83 mg/g{creat} — ABNORMAL HIGH (ref <30)
Microalb, Ur: 1 mg/dL

## 2024-03-14 LAB — LIPID PANEL
Cholesterol: 147 mg/dL (ref <200)
HDL: 64 mg/dL (ref >=50)
LDL Cholesterol (Calc): 69 mg/dL
Non-HDL Cholesterol (Calc): 83 mg/dL (ref <130)
Total CHOL/HDL Ratio: 2.3 (calc) (ref <5.0)
Triglycerides: 48 mg/dL (ref <150)

## 2024-03-14 LAB — HEMOGLOBIN A1C
Hgb A1c MFr Bld: 6.1 % — ABNORMAL HIGH (ref <5.7)
Mean Plasma Glucose: 128 mg/dL
eAG (mmol/L): 7.1 mmol/L

## 2024-03-17 ENCOUNTER — Ambulatory Visit: Payer: 59 | Admitting: Allergy

## 2024-04-04 ENCOUNTER — Telehealth: Payer: Self-pay

## 2024-04-04 NOTE — Telephone Encounter (Signed)
 Copied from CRM [id number]. Topic: Clinical - Lab/Test Results >> Apr 04, 2024  9:10 AM Pinkey ORN wrote: Reason for CRM: Lab Results >> Apr 04, 2024  9:11 AM Pinkey ORN wrote: Patient states she doesn't know how to operate mychart and is requesting a call back in regards to her lab results. Patient call back number [phone]

## 2024-04-10 ENCOUNTER — Ambulatory Visit (INDEPENDENT_AMBULATORY_CARE_PROVIDER_SITE_OTHER)

## 2024-04-10 DIAGNOSIS — J309 Allergic rhinitis, unspecified: Secondary | ICD-10-CM | POA: Diagnosis not present

## 2024-04-16 ENCOUNTER — Telehealth: Payer: Self-pay | Admitting: Family Medicine

## 2024-04-16 NOTE — Telephone Encounter (Signed)
 Prescription Request  04/16/2024  LOV: 03/13/2024  What is the name of the medication or equipment?   ALPRAZolam  (XANAX ) 1 MG tablet   Have you contacted your pharmacy to request a refill? Yes   Which pharmacy would you like this sent to?  [id number] [address], [place] - [address] [place] [address] Phone: [phone] Fax: [phone]    Patient notified that their request is being sent to the clinical staff for review and that they should receive a response within 2 business days.   Please advise pharmacist.

## 2024-04-17 ENCOUNTER — Other Ambulatory Visit: Payer: Self-pay | Admitting: Family Medicine

## 2024-04-17 DIAGNOSIS — R7303 Prediabetes: Secondary | ICD-10-CM

## 2024-04-17 MED ORDER — ALPRAZOLAM 1 MG PO TABS
1.0000 mg | ORAL_TABLET | Freq: Two times a day (BID) | ORAL | 2 refills | Status: DC
Start: 2024-04-17 — End: 2024-07-18

## 2024-04-20 ENCOUNTER — Other Ambulatory Visit: Payer: Self-pay | Admitting: Family Medicine

## 2024-04-20 DIAGNOSIS — I10 Essential (primary) hypertension: Secondary | ICD-10-CM

## 2024-04-20 DIAGNOSIS — K219 Gastro-esophageal reflux disease without esophagitis: Secondary | ICD-10-CM

## 2024-04-20 NOTE — Progress Notes (Unsigned)
 Follow Up Note  RE: Madeline Wood MRN: [id number] DOB: 1946-07-15 Date of Office Visit: 04/21/2024  Referring provider: [name], MD Primary care provider: [name], MD  Chief Complaint: No chief complaint on file.  History of Present Illness: I had the pleasure of seeing Madeline Wood for a follow up visit at the [facility] on 04/21/2024. She is a 78 y.o. female, who is being followed for allergic rhinoconjunctivitis on AIT. Her previous allergy  office visit was on 03/19/2023 with Madeline Mutter, FNP. Today is a regular follow up visit.  Discussed the use of AI scribe software for clinical note transcription with the patient, who gave verbal consent to proceed.  History of Present Illness            ***  Assessment and Plan: Shaindy is a 78 y.o. female with: Allergic rhinitis Continue allergen avoidance measures directed toward pollens, dust mite, mold, cat, and cockroach as listed below Patient is interested in stopping her injections at this time.  Continue an antihistamine once a day as needed for runny nose or itch. Remember to rotate to a different antihistamine about every 3 months. Some examples of over the counter antihistamines include Zyrtec (cetirizine), Xyzal (levocetirizine), Allegra  (fexofenadine ), and Claritin (loratidine).  Continue Flonase  1 to 2 sprays in each nostril once a day as needed for a stuffy nose Consider saline nasal rinses as needed for nasal symptoms. Use this before any medicated nasal sprays for best result   Allergic conjunctivitis Some over the counter eye drops include Pataday one drop in each eye once a day as needed for red, itchy eyes OR Zaditor one drop in each eye twice a day as needed for red itchy eyes.   Seasonal and perennial allergic rhinoconjunctivitis Past history - no recent testing. On AIT for more than 6 years now (GRASS-WEED-TREE-MITE-DOG & MOLD-CAT-CR) Interim history - tolerating AIT every 2  weeks with good benefit. Allegra  seems to dry out oral mucosa. Continue allergy  injections. Continue environmental control measures. ONLY use Allegra  (fexofenadine ) once a day if NEEDED. May use Flonase  (fluticasone ) nasal spray 1 spray per nostril twice a day as needed for nasal congestion.  Nasal saline spray (i.e., Simply Saline) or nasal saline lavage (i.e., NeilMed) is recommended as needed and prior to medicated nasal sprays. Will re-test before stopping AIT.   Assessment and Plan              No follow-ups on file.  No orders of the defined types were placed in this encounter.  Lab Orders  No laboratory test(s) ordered today    Diagnostics: Spirometry:  Tracings reviewed. Her effort: {Blank single:19197::Good reproducible efforts.,It was hard to get consistent efforts and there is a question as to whether this reflects a maximal maneuver.,Poor effort, data can not be interpreted.} FVC: ***L FEV1: ***L, ***% predicted FEV1/FVC ratio: ***% Interpretation: {Blank single:19197::Spirometry consistent with mild obstructive disease,Spirometry consistent with moderate obstructive disease,Spirometry consistent with severe obstructive disease,Spirometry consistent with possible restrictive disease,Spirometry consistent with mixed obstructive and restrictive disease,Spirometry uninterpretable due to technique,Spirometry consistent with normal pattern,No overt abnormalities noted given today's efforts}.  Please see scanned spirometry results for details.  Skin Testing: {Blank single:19197::Select foods,Environmental allergy  panel,Environmental allergy  panel and select foods,Food allergy  panel,None,Deferred due to recent antihistamines use}. *** Results discussed with patient/family.   Medication List:  Current Outpatient Medications  Medication Sig Dispense Refill   albuterol  (VENTOLIN  HFA) 108 (90 Base) MCG/ACT inhaler TAKE 2 PUFFS  INTO LUNGS  EVERY 6 HOURS AS NEEDED FOR WHEEZE OR SHORTNESS OF BREATH (Patient not taking: Reported on 03/13/2024) 8.5 each 3   ALPRAZolam  (XANAX ) 1 MG tablet TAKE 1 TABLET BY MOUTH TWICE A DAY 60 tablet 0   ALPRAZolam  (XANAX ) 1 MG tablet Take 1 tablet (1 mg total) by mouth 2 (two) times daily. 60 tablet 2   amLODipine  (NORVASC ) 5 MG tablet Take 1 tablet (5 mg total) by mouth daily. 90 tablet 1   aspirin 81 MG tablet Take 81 mg by mouth daily.     atorvastatin  (LIPITOR) 80 MG tablet TAKE 1 TABLET BY MOUTH EVERY DAY 90 tablet 3   colchicine  0.6 MG tablet PLEASE SEE ATTACHED FOR DETAILED DIRECTIONS (Patient not taking: Reported on 03/13/2024)     cyclobenzaprine  (FLEXERIL ) 5 MG tablet TAKE 1 TABLET BY MOUTH THREE TIMES A DAY AS NEEDED FOR MUSCLE SPASMS (Patient not taking: Reported on 03/13/2024) 20 tablet 0   EPINEPHrine  0.3 mg/0.3 mL IJ SOAJ injection USE AS DIRECTED FOR LIFE THREATENING ALLERGIC REACTIONS 2 each 1   ezetimibe  (ZETIA ) 10 MG tablet Take 1 tablet (10 mg total) by mouth daily. 90 tablet 3   fexofenadine  (ALLEGRA ) 180 MG tablet Take 1 tablet (180 mg total) by mouth daily. 30 tablet 5   fish oil-omega-3 fatty acids 1000 MG capsule Take 1 g by mouth. Takes occasionally (Patient not taking: Reported on 03/13/2024)     fluticasone  (FLONASE ) 50 MCG/ACT nasal spray PLACE 1-2 SPRAYS INTO BOTH NOSTRILS DAILY AS NEEDED FOR ALLERGIES OR RHINITIS. 48 mL 1   losartan  (COZAAR ) 100 MG tablet Take 1 tablet (100 mg total) by mouth daily. 90 tablet 1   metoprolol  tartrate (LOPRESSOR ) 50 MG tablet TAKE 1 TABLET BY MOUTH EVERY MORNING AND 1 AND 1/2 TABLET EVERY EVENING 225 tablet 2   Multiple Vitamin (MULTIVITAMIN) tablet Take 1 tablet by mouth. occasionally     nitroGLYCERIN  (NITROSTAT ) 0.4 MG SL tablet PLACE 1 TABLET UNDER THE TONGUE EVERY 5 MINUTES AS NEEDED FOR CHEST PAIN. 25 tablet 3   pantoprazole  (PROTONIX ) 40 MG tablet Take 1 tablet (40 mg total) by mouth daily. 90 tablet 1   Wheat Dextrin (BENEFIBER PO) Take by  mouth. Daily     zolpidem  (AMBIEN ) 10 MG tablet TAKE 1 TABLET BY MOUTH EVERY DAY AT BEDTIME AS NEEDED FOR SLEEP 30 tablet 0   No current facility-administered medications for this visit.   Allergies: Allergies  Allergen Reactions   Citalopram Hydrobromide    Escitalopram Oxalate    Paroxetine    Polysporin [Bacitracin-Polymyxin B]    Ciprofloxacin    I reviewed her past medical history, social history, family history, and environmental history and no significant changes have been reported from her previous visit.  Review of Systems  Constitutional:  Negative for appetite change, chills, fever and unexpected weight change.  HENT:  Negative for congestion and rhinorrhea.   Eyes:  Negative for itching.  Respiratory:  Negative for cough, chest tightness, shortness of breath and wheezing.   Cardiovascular:  Negative for chest pain.  Gastrointestinal:  Negative for abdominal pain.  Genitourinary:  Negative for difficulty urinating.  Skin:  Negative for rash.  Allergic/Immunologic: Positive for environmental allergies.  Neurological:  Negative for headaches.    Objective: There were no vitals taken for this visit. There is no height or weight on file to calculate BMI. Physical Exam Vitals and nursing note reviewed.  Constitutional:      Appearance: Normal appearance. She is  well-developed.  HENT:     Head: Normocephalic and atraumatic.     Right Ear: Tympanic membrane and external ear normal.     Left Ear: Tympanic membrane and external ear normal.     Nose: Nose normal.     Mouth/Throat:     Mouth: Mucous membranes are moist.     Pharynx: Oropharynx is clear.  Eyes:     Conjunctiva/sclera: Conjunctivae normal.  Cardiovascular:     Rate and Rhythm: Normal rate and regular rhythm.     Heart sounds: Normal heart sounds. No murmur heard.    No friction rub. No gallop.  Pulmonary:     Effort: Pulmonary effort is normal.     Breath sounds: Normal breath sounds. No wheezing,  rhonchi or rales.  Musculoskeletal:     Cervical back: Neck supple.  Skin:    General: Skin is warm.     Findings: No rash.  Neurological:     Mental Status: She is alert and oriented to person, place, and time.  Psychiatric:        Behavior: Behavior normal.    Previous notes and tests were reviewed. The plan was reviewed with the patient/family, and all questions/concerned were addressed.  It was my pleasure to see Colleen today and participate in her care. Please feel free to contact me with any questions or concerns.  Sincerely,  Orlan Cramp, DO Allergy  & Immunology  [facility]  [place] office: [phone] [facility] office: [phone]

## 2024-04-21 ENCOUNTER — Ambulatory Visit (INDEPENDENT_AMBULATORY_CARE_PROVIDER_SITE_OTHER): Admitting: Allergy

## 2024-04-21 ENCOUNTER — Other Ambulatory Visit: Payer: Self-pay

## 2024-04-21 ENCOUNTER — Encounter: Payer: Self-pay | Admitting: Allergy

## 2024-04-21 VITALS — BP 124/84 | HR 70 | Temp 98.6°F | Ht 64.5 in | Wt 165.0 lb

## 2024-04-21 DIAGNOSIS — J4520 Mild intermittent asthma, uncomplicated: Secondary | ICD-10-CM

## 2024-04-21 DIAGNOSIS — J302 Other seasonal allergic rhinitis: Secondary | ICD-10-CM | POA: Diagnosis not present

## 2024-04-21 DIAGNOSIS — J3089 Other allergic rhinitis: Secondary | ICD-10-CM | POA: Diagnosis not present

## 2024-04-21 DIAGNOSIS — H1013 Acute atopic conjunctivitis, bilateral: Secondary | ICD-10-CM | POA: Diagnosis not present

## 2024-04-21 DIAGNOSIS — H1010 Acute atopic conjunctivitis, unspecified eye: Secondary | ICD-10-CM

## 2024-04-21 MED ORDER — ALBUTEROL SULFATE HFA 108 (90 BASE) MCG/ACT IN AERS
2.0000 | INHALATION_SPRAY | RESPIRATORY_TRACT | 1 refills | Status: AC | PRN
Start: 2024-04-21 — End: ?

## 2024-04-21 NOTE — Telephone Encounter (Signed)
 Requested Prescriptions  Pending Prescriptions Disp Refills   losartan  (COZAAR ) 100 MG tablet [Pharmacy Med Name: LOSARTAN  POTASSIUM 100 MG TAB] 90 tablet 0    Sig: TAKE 1 TABLET BY MOUTH EVERY DAY     Cardiovascular:  Angiotensin Receptor Blockers Passed - 04/21/2024  4:17 PM      Passed - Cr in normal range and within 180 days    Creat  Date Value Ref Range Status  03/13/2024 0.70 0.60 - 1.00 mg/dL Final   Creatinine, Urine  Date Value Ref Range Status  03/13/2024 12 (L) 20 - 275 mg/dL Final         Passed - K in normal range and within 180 days    Potassium  Date Value Ref Range Status  03/13/2024 3.6 3.5 - 5.3 mmol/L Final         Passed - Patient is not pregnant      Passed - Last BP in normal range    BP Readings from Last 1 Encounters:  04/21/24 124/84         Passed - Valid encounter within last 6 months    Recent Outpatient Visits           1 month ago Prediabetes   [place] [facility] Duanne, Butler DASEN, MD   4 months ago Prediabetes   [place] [facility] Duanne, Butler DASEN, MD   1 year ago Essential hypertension   [place] [facility] Duanne, Butler DASEN, MD   1 year ago Viral URI with cough   [place] [facility] Kayla Jeoffrey RAMAN, FNP   2 years 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) [facility])   [place] [facility] Pickard, Butler DASEN, MD               pantoprazole  (PROTONIX ) 40 MG tablet [Pharmacy Med Name: PANTOPRAZOLE  SOD DR 40 MG TAB] 90 tablet 0    Sig: TAKE 1 TABLET BY MOUTH EVERY DAY     Gastroenterology: Proton Pump Inhibitors Passed - 04/21/2024  4:17 PM      Passed - Valid encounter within last 12 months    Recent Outpatient Visits           1 month ago Prediabetes   [place] [facility] Pickard, Butler DASEN, MD   4 months ago Prediabetes   [place] [facility] Duanne, Butler DASEN, MD   1 year  ago Essential hypertension   [place] [facility] [name], MD   1 year ago Viral URI with cough   [place] [facility] Kayla Jeoffrey RAMAN, FNP   2 years 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) [facility])   [place] [facility] Pickard, Butler DASEN, MD

## 2024-04-21 NOTE — Patient Instructions (Addendum)
 Skin testing instructions:  Return for allergy  skin testing. Will make additional recommendations based on results. Make sure you don't take any antihistamines for 3 days before the skin testing appointment. Don't put any lotion on the back and arms on the day of testing.  Must be in good health and not ill. No vaccines/injections/antibiotics within the past 7 days.  Plan on being here for 30-60 minutes.   Environmental allergies Continue environmental control measures.  Only use as needed:  Use over the counter antihistamines such as Zyrtec (cetirizine), Claritin (loratadine), Allegra  (fexofenadine ), or Xyzal (levocetirizine) daily as needed. May switch antihistamines every few months. Use Flonase  (fluticasone ) nasal spray 1-2 sprays per nostril once a day as needed for nasal congestion.  Nasal saline spray (i.e., Simply Saline) or nasal saline lavage (i.e., NeilMed) is recommended as needed and prior to medicated nasal sprays.  Breathing Normal breathing test today.  May use albuterol  rescue inhaler 2 puffs every 4 to 6 hours as needed for shortness of breath, chest tightness, coughing, and wheezing.  Monitor frequency of use - if you need to use it more than twice per week on a consistent basis let us  know.   Follow up for skin testing.

## 2024-05-07 ENCOUNTER — Encounter: Payer: Self-pay | Admitting: Allergy

## 2024-05-07 ENCOUNTER — Ambulatory Visit (INDEPENDENT_AMBULATORY_CARE_PROVIDER_SITE_OTHER): Admitting: Allergy

## 2024-05-07 DIAGNOSIS — J3089 Other allergic rhinitis: Secondary | ICD-10-CM

## 2024-05-07 DIAGNOSIS — H1010 Acute atopic conjunctivitis, unspecified eye: Secondary | ICD-10-CM

## 2024-05-07 DIAGNOSIS — H1013 Acute atopic conjunctivitis, bilateral: Secondary | ICD-10-CM

## 2024-05-07 DIAGNOSIS — J4520 Mild intermittent asthma, uncomplicated: Secondary | ICD-10-CM

## 2024-05-07 DIAGNOSIS — J302 Other seasonal allergic rhinitis: Secondary | ICD-10-CM

## 2024-05-07 NOTE — Patient Instructions (Addendum)
 Today's skin testing inconclusive as your positive control did NOT react at all.   Get bloodwork If negative we will stop the allergy  injections.  We are ordering labs, so please allow 1-2 weeks for the results to come back. With the newly implemented Cures Act, the labs might be visible to you at the same time that they become visible to me. However, I will not address the results until all of the results are back, so please be patient.  In the meantime, continue recommendations in your patient instructions, including avoidance measures (if applicable), until you hear from me.  Only use as needed:  Use over the counter antihistamines such as Zyrtec (cetirizine), Claritin (loratadine), Allegra  (fexofenadine ), or Xyzal (levocetirizine) daily as needed. May switch antihistamines every few months. Use Flonase  (fluticasone ) nasal spray 1-2 sprays per nostril once a day as needed for nasal congestion.  Nasal saline spray (i.e., Simply Saline) or nasal saline lavage (i.e., NeilMed) is recommended as needed and prior to medicated nasal sprays.  Breathing May use albuterol  rescue inhaler 2 puffs every 4 to 6 hours as needed for shortness of breath, chest tightness, coughing, and wheezing.  Monitor frequency of use - if you need to use it more than twice per week on a consistent basis let us  know.   Return in about 1 year (around 05/07/2025). Or sooner if needed.

## 2024-05-07 NOTE — Progress Notes (Signed)
 Skin testing note  RE: MARGARETE HORACE MRN: [id number] DOB: 1945/12/09 Date of Office Visit: 05/07/2024  Referring provider: [name], MD Primary care provider: [name], MD  Chief Complaint: skin testing  History of Present Illness: I had the pleasure of seeing Avina Eberle for a skin testing visit at the [facility] on 05/07/2024. She is a 78 y.o. female, who is being followed for allergic rhinoconjunctivitis on AIT and reactive airway disease. Her previous allergy  office visit was on 04/21/2024 with Dr. Luke. Today is a skin testing visit.   Assessment and Plan: Avital is a 78 y.o. female with: Other allergic rhinitis Seasonal and perennial allergic rhinoconjunctivitis Past history - no recent testing. On AIT for more than 6 years now (GRASS-WEED-TREE-MITE-DOG & MOLD-CAT-CR) Today's skin prick testing inconclusive as your positive control did NOT react at all on the back or on the forearm. Confirmed no antihistamines x 3 days.  Get bloodwork If negative we will stop the allergy  injections.  Only use as needed:  Use over the counter antihistamines such as Zyrtec (cetirizine), Claritin (loratadine), Allegra  (fexofenadine ), or Xyzal (levocetirizine) daily as needed. May switch antihistamines every few months. Use Flonase  (fluticasone ) nasal spray 1-2 sprays per nostril once a day as needed for nasal congestion.  Nasal saline spray (i.e., Simply Saline) or nasal saline lavage (i.e., NeilMed) is recommended as needed and prior to medicated nasal sprays.   Mild intermittent reactive airway disease May use albuterol  rescue inhaler 2 puffs every 4 to 6 hours as needed for shortness of breath, chest tightness, coughing, and wheezing.  Monitor frequency of use - if you need to use it more than twice per week on a consistent basis let us  know.   Return in about 1 year (around 05/07/2025).  No orders of the defined types were placed in this encounter.  Lab  Orders         Allergens w/Total IgE Area 2      Diagnostics: Skin Testing: Environmental allergy  panel. Negative including positive control x 2. Inconclusive test results.  Results discussed with patient/family.  Airborne Adult Perc - 05/07/24 1424     Time Antigen Placed 1424    Allergen Manufacturer Jestine    Location Back    Number of Test 55    1. Control-Buffer 50% Glycerol Negative    2. Control-Histamine  Negative    3. Bahia Negative    4. French Southern Territories Negative    5. Johnson Negative    6. Kentucky  Blue Negative    7. Meadow Fescue Negative    8. Perennial Rye Negative    9. Timothy Negative    10. Ragweed Mix Negative    11. Cocklebur Negative    12. Plantain,  English Negative    13. Baccharis Negative    14. Dog Fennel Negative    15. Russian Thistle Negative    16. Lamb's Quarters Negative    17. Sheep Sorrell Negative    18. Rough Pigweed Negative    19. Marsh Elder, Rough Negative    20. Mugwort, Common Negative    21. Box, Elder Negative    22. Cedar, red Negative    23. Sweet Gum Negative    24. Pecan Pollen Negative    25. Pine Mix Negative    26. Walnut, Black Pollen Negative    27. Red Mulberry Negative    28. Ash Mix Negative    29. Birch Mix Negative  30. Beech American Negative    31. Cottonwood, Guinea-Bissau Negative    32. Hickory, White Negative    33. Maple Mix Negative    34. Oak, Guinea-Bissau Mix Negative    35. Sycamore Eastern Negative    36. Alternaria Alternata Negative    37. Cladosporium Herbarum Negative    38. Aspergillus Mix Negative    39. Penicillium Mix Negative    40. Bipolaris Sorokiniana (Helminthosporium) Negative    41. Drechslera Spicifera (Curvularia) Negative    42. Mucor Plumbeus Negative    43. Fusarium Moniliforme Negative    44. Aureobasidium Pullulans (pullulara) Negative    45. Rhizopus Oryzae Negative    46. Botrytis Cinera Negative    47. Epicoccum Nigrum Negative    48. Phoma Betae Negative    49. Dust Mite Mix  Negative    50. Cat Hair 10,000 BAU/ml Negative    51.  Dog Epithelia Negative    52. Mixed Feathers Negative    53. Horse Epithelia Negative    54. Cockroach, German Negative    55. Tobacco Leaf Negative          Previous notes and tests were reviewed. The plan was reviewed with the patient/family, and all questions/concerned were addressed.  It was my pleasure to see Ryker today and participate in her care. Please feel free to contact me with any questions or concerns.  Sincerely,  Orlan Cramp, DO Allergy  & Immunology  [facility]  [place] office: [phone] [facility] office: [phone]

## 2024-05-12 ENCOUNTER — Ambulatory Visit: Payer: Self-pay | Admitting: Allergy

## 2024-05-12 ENCOUNTER — Telehealth: Payer: Self-pay

## 2024-05-12 LAB — ALLERGENS W/TOTAL IGE AREA 2
Alternaria Alternata IgE: <0.1 kU/L
Aspergillus Fumigatus IgE: <0.1 kU/L
Bermuda Grass IgE: <0.1 kU/L
Cat Dander IgE: <0.1 kU/L
Cedar, Mountain IgE: <0.1 kU/L
Cladosporium Herbarum IgE: <0.1 kU/L
Cockroach, German IgE: 0.33 kU/L — AB
Common Silver Birch IgE: <0.1 kU/L
Cottonwood IgE: <0.1 kU/L
D Farinae IgE: <0.1 kU/L
D Pteronyssinus IgE: <0.1 kU/L
Dog Dander IgE: <0.1 kU/L
Elm, American IgE: <0.1 kU/L
IgE (Immunoglobulin E), Serum: 68 [IU]/mL (ref 6–495)
Johnson Grass IgE: <0.1 kU/L
Maple/Box Elder IgE: <0.1 kU/L
Mouse Urine IgE: <0.1 kU/L
Oak, White IgE: <0.1 kU/L
Pecan, Hickory IgE: <0.1 kU/L
Penicillium Chrysogen IgE: <0.1 kU/L
Pigweed, Rough IgE: <0.1 kU/L
Ragweed, Short IgE: <0.1 kU/L
Sheep Sorrel IgE Qn: <0.1 kU/L
Timothy Grass IgE: <0.1 kU/L
White Mulberry IgE: <0.1 kU/L

## 2024-05-12 NOTE — Telephone Encounter (Signed)
 Copied from CRM [id number]. Topic: General - Other >> May 12, 2024  3:27 PM Wess RAMAN wrote: Reason for CRM: Patient responded yes she would like to stop allergy  shots.   My chart message from lab results: With these results, do you want to stop allergy  shots and see how you are doing?

## 2024-05-13 NOTE — Telephone Encounter (Signed)
 Been put in flowsheet.

## 2024-05-20 ENCOUNTER — Encounter: Payer: Self-pay | Admitting: Podiatry

## 2024-05-20 ENCOUNTER — Ambulatory Visit (INDEPENDENT_AMBULATORY_CARE_PROVIDER_SITE_OTHER): Admitting: Podiatry

## 2024-05-20 DIAGNOSIS — M79675 Pain in left toe(s): Secondary | ICD-10-CM

## 2024-05-20 DIAGNOSIS — B351 Tinea unguium: Secondary | ICD-10-CM

## 2024-05-20 DIAGNOSIS — M79674 Pain in right toe(s): Secondary | ICD-10-CM

## 2024-05-20 DIAGNOSIS — E1151 Type 2 diabetes mellitus with diabetic peripheral angiopathy without gangrene: Secondary | ICD-10-CM

## 2024-05-20 DIAGNOSIS — L84 Corns and callosities: Secondary | ICD-10-CM | POA: Diagnosis not present

## 2024-05-20 NOTE — Progress Notes (Unsigned)
  Subjective:  Patient ID: Madeline Wood, female    DOB: 1945/10/28,  MRN: [id number]  Madeline Wood presents to clinic today for {jgcomplaint:23593}  Chief Complaint  Patient presents with   Diabetes    [facility] Diet control diabetes. LOV with PCP 03/13/24.   New problem(s): None. {jgcomplaint:23593}  PCP is [name], MD.  Allergies  Allergen Reactions   Citalopram Hydrobromide    Escitalopram Oxalate    Paroxetine    Polysporin [Bacitracin-Polymyxin B]    Ciprofloxacin     Review of Systems: Negative except as noted in the HPI.  Objective: No changes noted in today's physical examination. There were no vitals filed for this visit. Madeline Wood is a pleasant 78 y.o. female WD, WN in NAD. AAO x 3.  Vascular Examination: CFT less than 3 seconds. DP pulses 2/4 b/l. PT pulses 0/4  b/l. Digital hair absent. Skin temperature gradient warm to warn b/l. No ischemia or gangrene. No cyanosis or clubbing noted b/l. No edema noted b/l LE.   Neurological Examination: Sensation grossly intact b/l with 10 gram monofilament. Vibratory sensation intact b/l.   Dermatological Examination: Pedal skin thin, shiny and atrophic b/l. No open wounds. No interdigital macerations.   Toenails 1-5 b/l thick, discolored, elongated with subungual debris and pain on dorsal palpation.   Hyperkeratotic lesion(s) medial IPJ of right great toe, plantar IPJ of left great toe, and submet head 2 b/l.  No erythema, no edema, no drainage, no fluctuance.  Musculoskeletal Examination: Muscle strength 5/5 to all lower extremity muscle groups bilaterally. HAV with bunion deformity noted b/l LE. Hammertoe deformity noted 2-5 b/l.Madeline Wood No pain, crepitus or joint limitation noted with ROM b/l LE.  Patient ambulates independently without assistive aids.  Radiographs: None  Assessment/Plan: 1. Pain due to onychomycosis of toenails of both feet   2. Callus   3. Type II diabetes mellitus with peripheral circulatory  disorder (HCC)     No orders of the defined types were placed in this encounter.   None {Jgplan:23602::-Patient/POA to call should there be question/concern in the interim.}   Return in about 3 months (around 08/19/2024).  Delon LITTIE Merlin, [name]      [place] LOCATION: [address], [place] [address]                   Office [phone]   [facility] LOCATION: [address] [place], [place] [address] Office [phone]

## 2024-06-03 LAB — HM MAMMOGRAPHY

## 2024-06-04 ENCOUNTER — Telehealth: Payer: Self-pay

## 2024-06-04 ENCOUNTER — Telehealth: Payer: Self-pay | Admitting: Family Medicine

## 2024-06-04 NOTE — Telephone Encounter (Signed)
 Copied from CRM [id number]. Topic: Clinical - Lab/Test Results >> Jun 04, 2024 11:16 AM Delon DASEN wrote: Reason for CRM: received message about lab results- [phone]

## 2024-06-04 NOTE — Telephone Encounter (Signed)
 Copied from CRM #[id number]. Topic: Clinical - Lab/Test Results >> Jun 04, 2024 11:48 AM Selinda RAMAN wrote: Reason for CRM: The patient called in stating she had a message in my chart but she doesn't really know how to navigate her my chart too well. She says she had a mammogram yesterday so if it is regarding that or when results are ready can someone please contact her to go over. Please assist patient further.

## 2024-06-10 ENCOUNTER — Other Ambulatory Visit: Payer: Self-pay | Admitting: Physician Assistant

## 2024-06-28 ENCOUNTER — Other Ambulatory Visit: Payer: Self-pay | Admitting: Family Medicine

## 2024-07-14 ENCOUNTER — Other Ambulatory Visit: Payer: Self-pay | Admitting: Family Medicine

## 2024-07-14 DIAGNOSIS — R7303 Prediabetes: Secondary | ICD-10-CM

## 2024-07-16 NOTE — Telephone Encounter (Signed)
 Requested medication (s) are due for refill today: yes  Requested medication (s) are on the active medication list: yes  Last refill:  04/17/24  Future visit scheduled: no  Notes to clinic:  Unable to refill per protocol, cannot delegate.      Requested Prescriptions  Pending Prescriptions Disp Refills   ALPRAZolam  (XANAX ) 1 MG tablet [Pharmacy Med Name: ALPRAZOLAM  1 MG TABLET] 60 tablet 2    Sig: TAKE 1 TABLET BY MOUTH TWICE A DAY     Not Delegated - Psychiatry: Anxiolytics/Hypnotics 2 Failed - 07/16/2024 11:30 AM      Failed - This refill cannot be delegated      Failed - Urine Drug Screen completed in last 360 days      Passed - Patient is not pregnant      Passed - Valid encounter within last 6 months    Recent Outpatient Visits           4 months ago Prediabetes   [place] [facility] Pickard, Butler DASEN, MD   7 months ago Prediabetes   [place] [facility] [name], MD   1 year ago Essential hypertension   [place] [facility] [name], MD   1 year ago Viral URI with cough   [place] [facility] Kayla Jeoffrey RAMAN, FNP   2 years 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)   [place] [facility] Pickard, Butler DASEN, MD

## 2024-07-17 ENCOUNTER — Other Ambulatory Visit: Payer: Self-pay

## 2024-07-17 DIAGNOSIS — I10 Essential (primary) hypertension: Secondary | ICD-10-CM

## 2024-07-17 DIAGNOSIS — K219 Gastro-esophageal reflux disease without esophagitis: Secondary | ICD-10-CM

## 2024-07-17 NOTE — Telephone Encounter (Signed)
 Prescription Request  07/17/2024  LOV: 03/13/24  What is the name of the medication or equipment? losartan  (COZAAR ) 100 MG tablet [[id number]]   Have you contacted your pharmacy to request a refill? Yes   Which pharmacy would you like this sent to?  [id number] [address] [place], [place] - [address] [place] [address] Phone: [phone] Fax: [phone]    Patient notified that their request is being sent to the clinical staff for review and that they should receive a response within 2 business days.   Please advise at [facility] [phone]  Prescription Request  07/17/2024  LOV: 03/13/24  What is the name of the medication or equipment? pantoprazole  (PROTONIX ) 40 MG tablet [[id number]]   Have you contacted your pharmacy to request a refill? Yes   Which pharmacy would you like this sent to?  [id number] [address], [place] - [address] [place] [address] Phone: [phone] Fax: [phone]    Patient notified that their request is being sent to the clinical staff for review and that they should receive a response within 2 business days.   Please advise at [facility] [phone]

## 2024-07-18 MED ORDER — PANTOPRAZOLE SODIUM 40 MG PO TBEC: 40.0000 mg | DELAYED_RELEASE_TABLET | Freq: Every day | ORAL | 0 refills | Status: AC

## 2024-07-18 MED ORDER — LOSARTAN POTASSIUM 100 MG PO TABS: 100.0000 mg | ORAL_TABLET | Freq: Every day | ORAL | 0 refills | Status: AC

## 2024-07-18 NOTE — Telephone Encounter (Signed)
 Requested Prescriptions  Pending Prescriptions Disp Refills   pantoprazole  (PROTONIX ) 40 MG tablet 90 tablet 0    Sig: Take 1 tablet (40 mg total) by mouth daily.     Gastroenterology: Proton Pump Inhibitors Passed - 07/18/2024  5:00 PM      Passed - Valid encounter within last 12 months    Recent Outpatient Visits           4 months ago Prediabetes   [place] [facility] Pickard, Butler DASEN, MD   7 months ago Prediabetes   [place] [facility] Duanne, Butler DASEN, MD   1 year ago Essential hypertension   [place] [facility] Duanne, Butler DASEN, MD   1 year ago Viral URI with cough   [place] [facility] Kayla Jeoffrey RAMAN, FNP   2 years 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     Cardiovascular:  Angiotensin Receptor Blockers Passed - 07/18/2024  5:00 PM      Passed - Cr in normal range and within 180 days    Creat  Date Value Ref Range Status  03/13/2024 0.70 0.60 - 1.00 mg/dL Final   Creatinine, Urine  Date Value Ref Range Status  03/13/2024 12 (L) 20 - 275 mg/dL Final         Passed - K in normal range and within 180 days    Potassium  Date Value Ref Range Status  03/13/2024 3.6 3.5 - 5.3 mmol/L Final         Passed - Patient is not pregnant      Passed - Last BP in normal range    BP Readings from Last 1 Encounters:  04/21/24 124/84         Passed - Valid encounter within last 6 months    Recent Outpatient Visits           4 months ago Prediabetes   [place] [facility] [name], MD   7 months ago Prediabetes   [place] [facility] [name], MD   1 year ago Essential hypertension   [place] [facility] [name], MD   1 year ago Viral URI with cough   [facility] Kayla Jeoffrey RAMAN, FNP   2 years 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)    [facility] Pickard, Butler DASEN, MD

## 2024-08-11 ENCOUNTER — Other Ambulatory Visit: Payer: Self-pay | Admitting: Family Medicine

## 2024-08-11 NOTE — Telephone Encounter (Unsigned)
 Copied from CRM #[id number]. Topic: Clinical - Medication Refill >> Aug 11, 2024  4:17 PM Myrick T wrote: Patient said she is going out of town on 12/23  Medication: ALPRAZolam  (XANAX ) 1 MG tablet  Has the patient contacted their pharmacy? No  This is the patient's preferred pharmacy:  [id number] [place],  - [address] [place] [address] Phone: [phone] Fax: [phone]  Is this the correct pharmacy for this prescription? Yes  Has the prescription been filled recently? Yes  Is the patient out of the medication? No  Has the patient been seen for an appointment in the last year OR does the patient have an upcoming appointment? Yes  Can we respond through MyChart? No  Agent: Please be advised that Rx refills may take up to 3 business days. We ask that you follow-up with your pharmacy.

## 2024-08-14 MED ORDER — ALPRAZOLAM 1 MG PO TABS: 1.0000 mg | ORAL_TABLET | Freq: Two times a day (BID) | ORAL | 0 refills | Status: AC

## 2024-08-14 NOTE — Telephone Encounter (Signed)
 Requested medication (s) are due for refill today: yes  Requested medication (s) are on the active medication list: yes  Last refill:  12/14/23  Future visit scheduled: yes  Notes to clinic:  not delegated    Requested Prescriptions  Pending Prescriptions Disp Refills   ALPRAZolam  (XANAX ) 1 MG tablet 60 tablet 0    Sig: Take 1 tablet (1 mg total) by mouth 2 (two) times daily.     Not Delegated - Psychiatry: Anxiolytics/Hypnotics 2 Failed - 08/14/2024 12:40 PM      Failed - This refill cannot be delegated      Failed - Urine Drug Screen completed in last 360 days      Passed - Patient is not pregnant      Passed - Valid encounter within last 6 months    Recent Outpatient Visits           5 months ago Prediabetes   [facility] Pickard, Butler DASEN, MD   8 months ago Prediabetes   [place] [facility] [name], MD   1 year ago Essential hypertension   [place] [facility] [name], MD   1 year ago Viral URI with cough   [place] [facility] Kayla Jeoffrey RAMAN, FNP   2 years 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)   [place] [facility] Pickard, Butler DASEN, MD

## 2024-09-03 ENCOUNTER — Ambulatory Visit: Admitting: Podiatry

## 2024-09-03 ENCOUNTER — Encounter: Payer: Self-pay | Admitting: Podiatry

## 2024-09-03 DIAGNOSIS — M2012 Hallux valgus (acquired), left foot: Secondary | ICD-10-CM | POA: Diagnosis not present

## 2024-09-03 DIAGNOSIS — E119 Type 2 diabetes mellitus without complications: Secondary | ICD-10-CM | POA: Diagnosis not present

## 2024-09-03 DIAGNOSIS — M79674 Pain in right toe(s): Secondary | ICD-10-CM

## 2024-09-03 DIAGNOSIS — M2041 Other hammer toe(s) (acquired), right foot: Secondary | ICD-10-CM

## 2024-09-03 DIAGNOSIS — E1151 Type 2 diabetes mellitus with diabetic peripheral angiopathy without gangrene: Secondary | ICD-10-CM | POA: Diagnosis not present

## 2024-09-03 DIAGNOSIS — Z0189 Encounter for other specified special examinations: Secondary | ICD-10-CM

## 2024-09-03 DIAGNOSIS — B351 Tinea unguium: Secondary | ICD-10-CM | POA: Diagnosis not present

## 2024-09-03 DIAGNOSIS — M2042 Other hammer toe(s) (acquired), left foot: Secondary | ICD-10-CM | POA: Diagnosis not present

## 2024-09-03 DIAGNOSIS — L84 Corns and callosities: Secondary | ICD-10-CM

## 2024-09-03 DIAGNOSIS — M79675 Pain in left toe(s): Secondary | ICD-10-CM | POA: Diagnosis not present

## 2024-09-03 DIAGNOSIS — M2011 Hallux valgus (acquired), right foot: Secondary | ICD-10-CM | POA: Diagnosis not present

## 2024-09-10 ENCOUNTER — Encounter: Payer: Self-pay | Admitting: Podiatry

## 2024-09-10 NOTE — Progress Notes (Signed)
 "  Subjective:  Patient ID: Madeline Wood, female    DOB: [birth year],  MRN: [id number]  Madeline Wood presents to clinic today for for annual diabetic foot examination, at risk foot care. Pt has h/o NIDDM with PAD, and callus(es) b/l lower extremities and painful mycotic toenails that are difficult to trim. Painful toenails interfere with ambulation. Aggravating factors include wearing enclosed shoe gear. Pain is relieved with periodic professional debridement. Painful calluses are aggravated when weightbearing with and without shoegear. Pain is relieved with periodic professional debridement.  Chief Complaint  Patient presents with   [facility]    Rm16 Diabetic foot care/ A1c 6.1/ Dr. Butler Burr last visit    New problem(s): None.   PCP is Burr Butler DASEN, MD.  Allergies[1]  Review of Systems: Negative except as noted in the HPI.  Objective: No changes noted in today's physical examination. There were no vitals filed for this visit. Madeline Wood is a pleasant 79 y.o. female WD, WN in NAD. AAO x 3.   Diabetic foot exam was performed with the following findings:   Vascular Examination: CFT less than 3 seconds. DP pulses palpable b/l. PT pulses nonpalpable b/l. Digital hair absent. Skin temperature gradient warm to warn b/l. No ischemia or gangrene. No cyanosis or clubbing noted b/l.    Neurological Examination: Sensation grossly intact b/l with 10 gram monofilament. Vibratory sensation intact b/l.   Dermatological Examination: Pedal skin is warm and supple b/l LE. No open wounds. No interdigital macerations.   Toenails 1-5 b/l thick, discolored, elongated with subungual debris and pain on dorsal palpation.   Hyperkeratotic lesion(s) medial IPJ b/l great toes, submet head 2 left foot, and submet head 2 right foot.  No erythema, no edema, no drainage, no fluctuance.  Musculoskeletal Examination: Muscle strength 5/5 to all lower extremity muscle groups bilaterally. HAV with bunion  bilaterally and hammertoes 2-5 b/l.  Radiographs: None    Assessment/Plan: 1. Pain due to onychomycosis of toenails of both feet   2. Callus   3. Hallux valgus, acquired, bilateral   4. Acquired hammertoes of both feet   5. Type II diabetes mellitus with peripheral circulatory disorder (HCC)   6. Encounter for diabetic foot exam (HCC)   Diabetic foot examination performed today. All patient's and/or POA's questions/concerns addressed on today's visit. Toenails 1-5 b/l debrided in length and girth without incident. Callus(es) medial IPJ of left great toe, medial IPJ of right great toe, submet head 2 left foot, and submet head 2 right foot pared with sharp debridement without incident. Continue daily foot inspections and monitor blood glucose per PCP/Endocrinologist's recommendations.Continue soft, supportive shoe gear daily. Report any pedal injuries to medical professional. Call office if there are any questions/concerns. -Patient/POA to call should there be question/concern in the interim.   Return in about 3 months (around 12/02/2024).  Delon LITTIE Merlin, [name]      [place] LOCATION: [address], [place] [address]                   Office [phone]   [facility] LOCATION: [address] [place], [place] [address] Office [phone]     [1]  Allergies Allergen Reactions  Citalopram Hydrobromide    Escitalopram Oxalate    Paroxetine    Polysporin [Bacitracin-Polymyxin B]    Ciprofloxacin    "

## 2024-12-10 ENCOUNTER — Ambulatory Visit: Admitting: Podiatry

## 2025-02-05 ENCOUNTER — Encounter
